# Patient Record
Sex: Female | Born: 1975
Health system: Southern US, Community
[De-identification: ages and names within clinical notes are randomized; demographics above are authoritative.]

## PROBLEM LIST (undated history)

## (undated) DIAGNOSIS — K219 Gastro-esophageal reflux disease without esophagitis: Secondary | ICD-10-CM

## (undated) DIAGNOSIS — T7840XA Allergy, unspecified, initial encounter: Secondary | ICD-10-CM

## (undated) DIAGNOSIS — E119 Type 2 diabetes mellitus without complications: Secondary | ICD-10-CM

## (undated) DIAGNOSIS — Z86010 Personal history of colonic polyps: Secondary | ICD-10-CM

## (undated) DIAGNOSIS — G5600 Carpal tunnel syndrome, unspecified upper limb: Secondary | ICD-10-CM

## (undated) DIAGNOSIS — K589 Irritable bowel syndrome without diarrhea: Secondary | ICD-10-CM

## (undated) DIAGNOSIS — J309 Allergic rhinitis, unspecified: Secondary | ICD-10-CM

## (undated) DIAGNOSIS — D519 Vitamin B12 deficiency anemia, unspecified: Secondary | ICD-10-CM

## (undated) DIAGNOSIS — A0472 Enterocolitis due to Clostridium difficile, not specified as recurrent: Secondary | ICD-10-CM

## (undated) DIAGNOSIS — M797 Fibromyalgia: Secondary | ICD-10-CM

## (undated) DIAGNOSIS — E785 Hyperlipidemia, unspecified: Secondary | ICD-10-CM

## (undated) DIAGNOSIS — N889 Noninflammatory disorder of cervix uteri, unspecified: Secondary | ICD-10-CM

## (undated) DIAGNOSIS — I10 Essential (primary) hypertension: Secondary | ICD-10-CM

## (undated) DIAGNOSIS — G35D Multiple sclerosis, unspecified: Secondary | ICD-10-CM

## (undated) DIAGNOSIS — G4733 Obstructive sleep apnea (adult) (pediatric): Secondary | ICD-10-CM

## (undated) DIAGNOSIS — G35 Multiple sclerosis: Secondary | ICD-10-CM

## (undated) DIAGNOSIS — F419 Anxiety disorder, unspecified: Secondary | ICD-10-CM

## (undated) DIAGNOSIS — E559 Vitamin D deficiency, unspecified: Secondary | ICD-10-CM

## (undated) DIAGNOSIS — G4730 Sleep apnea, unspecified: Secondary | ICD-10-CM

## (undated) DIAGNOSIS — R011 Cardiac murmur, unspecified: Secondary | ICD-10-CM

## (undated) DIAGNOSIS — R5382 Chronic fatigue, unspecified: Secondary | ICD-10-CM

## (undated) HISTORY — DX: Essential (primary) hypertension: I10

## (undated) HISTORY — DX: Fibromyalgia: M79.7

## (undated) HISTORY — DX: Irritable bowel syndrome without diarrhea: K58.9

## (undated) HISTORY — DX: Obstructive sleep apnea (adult) (pediatric): G47.33

## (undated) HISTORY — DX: Vitamin D deficiency, unspecified: E55.9

## (undated) HISTORY — DX: Carpal tunnel syndrome, unspecified upper limb: G56.00

## (undated) HISTORY — DX: Chronic fatigue, unspecified: R53.82

## (undated) HISTORY — DX: Allergic rhinitis, unspecified: J30.9

## (undated) HISTORY — DX: Hyperlipidemia, unspecified: E78.5

## (undated) HISTORY — DX: Type 2 diabetes mellitus without complications: E11.9

## (undated) HISTORY — DX: Multiple sclerosis: G35

## (undated) HISTORY — DX: Multiple sclerosis, unspecified: G35.D

## (undated) HISTORY — DX: Enterocolitis due to Clostridium difficile, not specified as recurrent: A04.72

## (undated) HISTORY — DX: Gastro-esophageal reflux disease without esophagitis: K21.9

## (undated) HISTORY — DX: Cardiac murmur, unspecified: R01.1

## (undated) HISTORY — DX: Sleep apnea, unspecified: G47.30

## (undated) HISTORY — DX: Personal history of colonic polyps: Z86.010

## (undated) HISTORY — DX: Noninflammatory disorder of cervix uteri, unspecified: N88.9

## (undated) HISTORY — DX: Anxiety disorder, unspecified: F41.9

## (undated) HISTORY — DX: Vitamin B12 deficiency anemia, unspecified: D51.9

## (undated) HISTORY — DX: Allergy, unspecified, initial encounter: T78.40XA

---

## 1997-07-29 ENCOUNTER — Other Ambulatory Visit: Admission: RE | Admit: 1997-07-29 | Discharge: 1997-07-29 | Payer: Self-pay | Admitting: Obstetrics and Gynecology

## 1998-02-26 ENCOUNTER — Other Ambulatory Visit: Admission: RE | Admit: 1998-02-26 | Discharge: 1998-02-26 | Payer: Self-pay | Admitting: Obstetrics and Gynecology

## 1998-08-14 ENCOUNTER — Inpatient Hospital Stay (HOSPITAL_COMMUNITY): Admission: AD | Admit: 1998-08-14 | Discharge: 1998-08-17 | Payer: Self-pay | Admitting: Obstetrics and Gynecology

## 2000-09-13 ENCOUNTER — Other Ambulatory Visit: Admission: RE | Admit: 2000-09-13 | Discharge: 2000-09-13 | Payer: Self-pay | Admitting: Obstetrics and Gynecology

## 2000-09-28 ENCOUNTER — Other Ambulatory Visit: Admission: RE | Admit: 2000-09-28 | Discharge: 2000-09-28 | Payer: Self-pay | Admitting: Obstetrics and Gynecology

## 2001-02-21 ENCOUNTER — Inpatient Hospital Stay (HOSPITAL_COMMUNITY): Admission: AD | Admit: 2001-02-21 | Discharge: 2001-02-21 | Payer: Self-pay | Admitting: Obstetrics and Gynecology

## 2001-02-22 ENCOUNTER — Inpatient Hospital Stay (HOSPITAL_COMMUNITY): Admission: AD | Admit: 2001-02-22 | Discharge: 2001-02-22 | Payer: Self-pay | Admitting: *Deleted

## 2001-02-26 ENCOUNTER — Inpatient Hospital Stay (HOSPITAL_COMMUNITY): Admission: AD | Admit: 2001-02-26 | Discharge: 2001-02-26 | Payer: Self-pay | Admitting: Obstetrics and Gynecology

## 2001-03-17 ENCOUNTER — Inpatient Hospital Stay (HOSPITAL_COMMUNITY): Admission: AD | Admit: 2001-03-17 | Discharge: 2001-03-17 | Payer: Self-pay | Admitting: Obstetrics and Gynecology

## 2001-04-12 ENCOUNTER — Inpatient Hospital Stay (HOSPITAL_COMMUNITY): Admission: AD | Admit: 2001-04-12 | Discharge: 2001-04-14 | Payer: Self-pay | Admitting: Obstetrics and Gynecology

## 2002-08-08 ENCOUNTER — Other Ambulatory Visit: Admission: RE | Admit: 2002-08-08 | Discharge: 2002-08-08 | Payer: Self-pay | Admitting: Obstetrics and Gynecology

## 2003-08-13 ENCOUNTER — Other Ambulatory Visit: Admission: RE | Admit: 2003-08-13 | Discharge: 2003-08-13 | Payer: Self-pay | Admitting: Obstetrics and Gynecology

## 2003-09-12 ENCOUNTER — Encounter: Payer: Self-pay | Admitting: Internal Medicine

## 2003-09-26 ENCOUNTER — Encounter: Payer: Self-pay | Admitting: Internal Medicine

## 2003-09-26 ENCOUNTER — Ambulatory Visit (HOSPITAL_COMMUNITY): Admission: RE | Admit: 2003-09-26 | Discharge: 2003-09-26 | Payer: Self-pay | Admitting: Internal Medicine

## 2004-03-03 ENCOUNTER — Ambulatory Visit: Payer: Self-pay | Admitting: Internal Medicine

## 2004-04-22 ENCOUNTER — Ambulatory Visit: Payer: Self-pay | Admitting: Internal Medicine

## 2004-05-27 ENCOUNTER — Ambulatory Visit: Payer: Self-pay | Admitting: Internal Medicine

## 2004-06-21 ENCOUNTER — Ambulatory Visit: Payer: Self-pay | Admitting: Internal Medicine

## 2004-07-26 ENCOUNTER — Ambulatory Visit: Payer: Self-pay | Admitting: Internal Medicine

## 2004-08-26 ENCOUNTER — Ambulatory Visit: Payer: Self-pay | Admitting: Internal Medicine

## 2004-08-29 ENCOUNTER — Ambulatory Visit (HOSPITAL_COMMUNITY): Admission: RE | Admit: 2004-08-29 | Discharge: 2004-08-29 | Payer: Self-pay | Admitting: Internal Medicine

## 2004-09-24 ENCOUNTER — Ambulatory Visit: Payer: Self-pay | Admitting: Internal Medicine

## 2004-10-08 ENCOUNTER — Other Ambulatory Visit: Admission: RE | Admit: 2004-10-08 | Discharge: 2004-10-08 | Payer: Self-pay | Admitting: Obstetrics and Gynecology

## 2004-10-13 ENCOUNTER — Encounter: Admission: RE | Admit: 2004-10-13 | Discharge: 2004-10-13 | Payer: Self-pay | Admitting: Neurology

## 2004-10-22 ENCOUNTER — Ambulatory Visit: Payer: Self-pay | Admitting: Internal Medicine

## 2004-11-22 ENCOUNTER — Ambulatory Visit: Payer: Self-pay | Admitting: Internal Medicine

## 2004-12-27 ENCOUNTER — Ambulatory Visit: Payer: Self-pay | Admitting: Internal Medicine

## 2005-01-13 ENCOUNTER — Encounter: Admission: RE | Admit: 2005-01-13 | Discharge: 2005-01-13 | Payer: Self-pay | Admitting: Neurology

## 2005-01-16 ENCOUNTER — Encounter: Admission: RE | Admit: 2005-01-16 | Discharge: 2005-01-16 | Payer: Self-pay | Admitting: Neurology

## 2005-01-24 ENCOUNTER — Ambulatory Visit: Payer: Self-pay | Admitting: Internal Medicine

## 2005-02-02 ENCOUNTER — Ambulatory Visit (HOSPITAL_COMMUNITY): Admission: RE | Admit: 2005-02-02 | Discharge: 2005-02-02 | Payer: Self-pay | Admitting: Neurology

## 2005-02-24 ENCOUNTER — Ambulatory Visit: Payer: Self-pay | Admitting: Internal Medicine

## 2005-03-25 ENCOUNTER — Ambulatory Visit: Payer: Self-pay | Admitting: Internal Medicine

## 2005-04-26 ENCOUNTER — Ambulatory Visit: Payer: Self-pay | Admitting: Internal Medicine

## 2005-05-26 ENCOUNTER — Ambulatory Visit: Payer: Self-pay | Admitting: Internal Medicine

## 2005-06-28 ENCOUNTER — Ambulatory Visit: Payer: Self-pay | Admitting: Internal Medicine

## 2005-07-12 ENCOUNTER — Ambulatory Visit: Payer: Self-pay | Admitting: Internal Medicine

## 2005-08-25 ENCOUNTER — Ambulatory Visit: Payer: Self-pay | Admitting: Internal Medicine

## 2005-11-25 ENCOUNTER — Ambulatory Visit: Payer: Self-pay | Admitting: Internal Medicine

## 2005-12-07 ENCOUNTER — Ambulatory Visit: Payer: Self-pay | Admitting: Internal Medicine

## 2005-12-27 ENCOUNTER — Ambulatory Visit: Payer: Self-pay | Admitting: Internal Medicine

## 2006-01-21 ENCOUNTER — Encounter: Admission: RE | Admit: 2006-01-21 | Discharge: 2006-01-21 | Payer: Self-pay | Admitting: Neurology

## 2006-05-25 ENCOUNTER — Ambulatory Visit: Payer: Self-pay | Admitting: Internal Medicine

## 2006-06-27 ENCOUNTER — Ambulatory Visit: Payer: Self-pay | Admitting: Internal Medicine

## 2006-07-28 ENCOUNTER — Ambulatory Visit: Payer: Self-pay | Admitting: Internal Medicine

## 2006-09-06 ENCOUNTER — Ambulatory Visit: Payer: Self-pay | Admitting: Internal Medicine

## 2006-09-06 LAB — CONVERTED CEMR LAB
ALT: 26 units/L (ref 0–40)
AST: 20 units/L (ref 0–37)
Albumin: 3.9 g/dL (ref 3.5–5.2)
Alkaline Phosphatase: 62 units/L (ref 39–117)
Amylase: 50 units/L (ref 27–131)
BUN: 10 mg/dL (ref 6–23)
Bacteria, UA: NEGATIVE
Basophils Absolute: 0.1 10*3/uL (ref 0.0–0.1)
Basophils Relative: 0.9 % (ref 0.0–1.0)
Bilirubin Urine: NEGATIVE
Bilirubin, Direct: 0.1 mg/dL (ref 0.0–0.3)
CO2: 27 meq/L (ref 19–32)
Calcium: 9.4 mg/dL (ref 8.4–10.5)
Chloride: 108 meq/L (ref 96–112)
Creatinine, Ser: 0.7 mg/dL (ref 0.4–1.2)
Crystals: NEGATIVE
Eosinophils Absolute: 0.1 10*3/uL (ref 0.0–0.6)
Eosinophils Relative: 1.7 % (ref 0.0–5.0)
GFR calc Af Amer: 126 mL/min
GFR calc non Af Amer: 104 mL/min
Glucose, Bld: 102 mg/dL — ABNORMAL HIGH (ref 70–99)
H Pylori IgG: NEGATIVE
HCT: 39.4 % (ref 36.0–46.0)
Hemoglobin, Urine: NEGATIVE
Hemoglobin: 13.8 g/dL (ref 12.0–15.0)
Ketones, ur: NEGATIVE mg/dL
Leukocytes, UA: NEGATIVE
Lipase: 15 units/L (ref 11.0–59.0)
Lymphocytes Relative: 26.8 % (ref 12.0–46.0)
MCHC: 35 g/dL (ref 30.0–36.0)
MCV: 84.8 fL (ref 78.0–100.0)
Monocytes Absolute: 0.4 10*3/uL (ref 0.2–0.7)
Monocytes Relative: 6.2 % (ref 3.0–11.0)
Mucus, UA: NEGATIVE
Neutro Abs: 3.7 10*3/uL (ref 1.4–7.7)
Neutrophils Relative %: 64.4 % (ref 43.0–77.0)
Nitrite: NEGATIVE
Platelets: 320 10*3/uL (ref 150–400)
Potassium: 4.3 meq/L (ref 3.5–5.1)
RBC / HPF: NONE SEEN
RBC: 4.64 M/uL (ref 3.87–5.11)
RDW: 13.8 % (ref 11.5–14.6)
Sodium: 139 meq/L (ref 135–145)
Specific Gravity, Urine: 1.02 (ref 1.000–1.03)
Total Bilirubin: 0.7 mg/dL (ref 0.3–1.2)
Total Protein, Urine: NEGATIVE mg/dL
Total Protein: 7.3 g/dL (ref 6.0–8.3)
Urine Glucose: NEGATIVE mg/dL
Urobilinogen, UA: 0.2 (ref 0.0–1.0)
WBC: 5.9 10*3/uL (ref 4.5–10.5)
pH: 6 (ref 5.0–8.0)

## 2006-09-08 ENCOUNTER — Ambulatory Visit: Payer: Self-pay | Admitting: Internal Medicine

## 2006-09-18 DIAGNOSIS — K219 Gastro-esophageal reflux disease without esophagitis: Secondary | ICD-10-CM

## 2007-01-05 ENCOUNTER — Ambulatory Visit: Payer: Self-pay | Admitting: Internal Medicine

## 2007-03-12 ENCOUNTER — Telehealth (INDEPENDENT_AMBULATORY_CARE_PROVIDER_SITE_OTHER): Payer: Self-pay | Admitting: *Deleted

## 2007-03-13 ENCOUNTER — Ambulatory Visit: Payer: Self-pay | Admitting: Internal Medicine

## 2007-03-13 DIAGNOSIS — I10 Essential (primary) hypertension: Secondary | ICD-10-CM

## 2007-03-13 DIAGNOSIS — E538 Deficiency of other specified B group vitamins: Secondary | ICD-10-CM | POA: Insufficient documentation

## 2007-03-13 DIAGNOSIS — J309 Allergic rhinitis, unspecified: Secondary | ICD-10-CM | POA: Insufficient documentation

## 2007-03-13 DIAGNOSIS — Z8719 Personal history of other diseases of the digestive system: Secondary | ICD-10-CM

## 2007-03-13 DIAGNOSIS — G35 Multiple sclerosis: Secondary | ICD-10-CM

## 2007-03-13 DIAGNOSIS — E785 Hyperlipidemia, unspecified: Secondary | ICD-10-CM

## 2007-03-13 HISTORY — DX: Hyperlipidemia, unspecified: E78.5

## 2007-03-13 HISTORY — DX: Essential (primary) hypertension: I10

## 2007-03-13 HISTORY — DX: Allergic rhinitis, unspecified: J30.9

## 2007-03-19 ENCOUNTER — Telehealth (INDEPENDENT_AMBULATORY_CARE_PROVIDER_SITE_OTHER): Payer: Self-pay | Admitting: *Deleted

## 2007-04-09 ENCOUNTER — Telehealth (INDEPENDENT_AMBULATORY_CARE_PROVIDER_SITE_OTHER): Payer: Self-pay | Admitting: *Deleted

## 2007-04-16 ENCOUNTER — Ambulatory Visit: Payer: Self-pay | Admitting: Internal Medicine

## 2007-04-30 ENCOUNTER — Encounter: Payer: Self-pay | Admitting: Internal Medicine

## 2007-04-30 ENCOUNTER — Ambulatory Visit: Payer: Self-pay | Admitting: Internal Medicine

## 2007-04-30 DIAGNOSIS — D179 Benign lipomatous neoplasm, unspecified: Secondary | ICD-10-CM | POA: Insufficient documentation

## 2007-04-30 LAB — CONVERTED CEMR LAB: TSH: 1.88 microintl units/mL (ref 0.35–5.50)

## 2007-05-17 ENCOUNTER — Ambulatory Visit: Payer: Self-pay | Admitting: Internal Medicine

## 2007-06-11 ENCOUNTER — Ambulatory Visit: Payer: Self-pay | Admitting: Internal Medicine

## 2007-07-12 ENCOUNTER — Ambulatory Visit: Payer: Self-pay | Admitting: Internal Medicine

## 2007-08-13 ENCOUNTER — Ambulatory Visit: Payer: Self-pay | Admitting: Internal Medicine

## 2007-08-13 LAB — CONVERTED CEMR LAB: Beta hcg, urine, semiquantitative: NEGATIVE

## 2007-08-22 ENCOUNTER — Ambulatory Visit: Payer: Self-pay | Admitting: Internal Medicine

## 2007-08-22 DIAGNOSIS — R229 Localized swelling, mass and lump, unspecified: Secondary | ICD-10-CM

## 2007-08-22 DIAGNOSIS — R21 Rash and other nonspecific skin eruption: Secondary | ICD-10-CM | POA: Insufficient documentation

## 2007-09-20 ENCOUNTER — Ambulatory Visit: Payer: Self-pay | Admitting: Internal Medicine

## 2007-09-24 ENCOUNTER — Telehealth (INDEPENDENT_AMBULATORY_CARE_PROVIDER_SITE_OTHER): Payer: Self-pay | Admitting: *Deleted

## 2007-09-25 ENCOUNTER — Ambulatory Visit: Payer: Self-pay | Admitting: Internal Medicine

## 2007-09-25 DIAGNOSIS — R079 Chest pain, unspecified: Secondary | ICD-10-CM

## 2007-09-25 DIAGNOSIS — F411 Generalized anxiety disorder: Secondary | ICD-10-CM | POA: Insufficient documentation

## 2007-10-11 ENCOUNTER — Ambulatory Visit: Payer: Self-pay | Admitting: Internal Medicine

## 2007-10-11 ENCOUNTER — Encounter (INDEPENDENT_AMBULATORY_CARE_PROVIDER_SITE_OTHER): Payer: Self-pay | Admitting: *Deleted

## 2007-11-09 ENCOUNTER — Ambulatory Visit: Payer: Self-pay | Admitting: Internal Medicine

## 2007-11-14 LAB — CONVERTED CEMR LAB: Pap Smear: NORMAL

## 2008-02-07 ENCOUNTER — Ambulatory Visit: Payer: Self-pay | Admitting: Internal Medicine

## 2008-03-10 ENCOUNTER — Ambulatory Visit: Payer: Self-pay | Admitting: Internal Medicine

## 2008-05-08 ENCOUNTER — Ambulatory Visit: Payer: Self-pay | Admitting: Internal Medicine

## 2008-06-06 ENCOUNTER — Ambulatory Visit: Payer: Self-pay | Admitting: Internal Medicine

## 2008-06-06 DIAGNOSIS — R51 Headache: Secondary | ICD-10-CM

## 2008-06-06 DIAGNOSIS — J209 Acute bronchitis, unspecified: Secondary | ICD-10-CM

## 2008-06-06 DIAGNOSIS — R519 Headache, unspecified: Secondary | ICD-10-CM | POA: Insufficient documentation

## 2008-06-06 DIAGNOSIS — J019 Acute sinusitis, unspecified: Secondary | ICD-10-CM

## 2008-07-31 ENCOUNTER — Ambulatory Visit: Payer: Self-pay | Admitting: Internal Medicine

## 2008-09-02 ENCOUNTER — Ambulatory Visit: Payer: Self-pay | Admitting: Internal Medicine

## 2008-10-03 ENCOUNTER — Telehealth: Payer: Self-pay | Admitting: Internal Medicine

## 2008-10-14 ENCOUNTER — Telehealth: Payer: Self-pay | Admitting: Internal Medicine

## 2008-10-24 ENCOUNTER — Ambulatory Visit: Payer: Self-pay | Admitting: Internal Medicine

## 2008-10-24 LAB — CONVERTED CEMR LAB
ALT: 28 units/L (ref 0–35)
AST: 30 units/L (ref 0–37)
Albumin: 3.8 g/dL (ref 3.5–5.2)
Alkaline Phosphatase: 68 units/L (ref 39–117)
BUN: 9 mg/dL (ref 6–23)
Basophils Absolute: 0.1 10*3/uL (ref 0.0–0.1)
Basophils Relative: 0.8 % (ref 0.0–3.0)
Bilirubin Urine: NEGATIVE
Bilirubin, Direct: 0.1 mg/dL (ref 0.0–0.3)
CO2: 27 meq/L (ref 19–32)
Calcium: 9.3 mg/dL (ref 8.4–10.5)
Chloride: 105 meq/L (ref 96–112)
Cholesterol: 192 mg/dL (ref 0–200)
Creatinine, Ser: 0.9 mg/dL (ref 0.4–1.2)
Direct LDL: 114.3 mg/dL
Eosinophils Absolute: 0.1 10*3/uL (ref 0.0–0.7)
Eosinophils Relative: 2.3 % (ref 0.0–5.0)
GFR calc non Af Amer: 76.9 mL/min (ref >60)
Glucose, Bld: 106 mg/dL — ABNORMAL HIGH (ref 70–99)
HCT: 39.4 % (ref 36.0–46.0)
HDL: 35.2 mg/dL — ABNORMAL LOW (ref >39.00)
Hemoglobin, Urine: NEGATIVE
Hemoglobin: 13.7 g/dL (ref 12.0–15.0)
Ketones, ur: NEGATIVE mg/dL
Lymphocytes Relative: 28.4 % (ref 12.0–46.0)
Lymphs Abs: 1.8 10*3/uL (ref 0.7–4.0)
MCHC: 34.7 g/dL (ref 30.0–36.0)
MCV: 86.9 fL (ref 78.0–100.0)
Monocytes Absolute: 0.3 10*3/uL (ref 0.1–1.0)
Monocytes Relative: 4.9 % (ref 3.0–12.0)
Neutro Abs: 4.2 10*3/uL (ref 1.4–7.7)
Neutrophils Relative %: 63.6 % (ref 43.0–77.0)
Nitrite: NEGATIVE
Platelets: 316 10*3/uL (ref 150.0–400.0)
Potassium: 3.6 meq/L (ref 3.5–5.1)
RBC: 4.53 M/uL (ref 3.87–5.11)
RDW: 14.2 % (ref 11.5–14.6)
Sodium: 142 meq/L (ref 135–145)
Specific Gravity, Urine: 1.015 (ref 1.000–1.030)
TSH: 1.75 microintl units/mL (ref 0.35–5.50)
Total Bilirubin: 0.8 mg/dL (ref 0.3–1.2)
Total CHOL/HDL Ratio: 5
Total Protein, Urine: NEGATIVE mg/dL
Total Protein: 7.1 g/dL (ref 6.0–8.3)
Triglycerides: 327 mg/dL — ABNORMAL HIGH (ref 0.0–149.0)
Urine Glucose: NEGATIVE mg/dL
Urobilinogen, UA: 0.2 (ref 0.0–1.0)
VLDL: 65.4 mg/dL — ABNORMAL HIGH (ref 0.0–40.0)
WBC: 6.5 10*3/uL (ref 4.5–10.5)
pH: 6 (ref 5.0–8.0)

## 2008-10-30 ENCOUNTER — Ambulatory Visit: Payer: Self-pay | Admitting: Internal Medicine

## 2008-10-30 ENCOUNTER — Encounter (INDEPENDENT_AMBULATORY_CARE_PROVIDER_SITE_OTHER): Payer: Self-pay | Admitting: *Deleted

## 2008-10-30 DIAGNOSIS — R197 Diarrhea, unspecified: Secondary | ICD-10-CM

## 2008-10-30 DIAGNOSIS — G4710 Hypersomnia, unspecified: Secondary | ICD-10-CM | POA: Insufficient documentation

## 2008-10-31 ENCOUNTER — Encounter: Payer: Self-pay | Admitting: Internal Medicine

## 2008-11-10 ENCOUNTER — Telehealth (INDEPENDENT_AMBULATORY_CARE_PROVIDER_SITE_OTHER): Payer: Self-pay | Admitting: *Deleted

## 2008-11-10 ENCOUNTER — Encounter (INDEPENDENT_AMBULATORY_CARE_PROVIDER_SITE_OTHER): Payer: Self-pay | Admitting: *Deleted

## 2008-11-18 ENCOUNTER — Ambulatory Visit: Payer: Self-pay | Admitting: Pulmonary Disease

## 2008-11-19 ENCOUNTER — Encounter: Payer: Self-pay | Admitting: Pulmonary Disease

## 2008-11-19 ENCOUNTER — Ambulatory Visit (HOSPITAL_BASED_OUTPATIENT_CLINIC_OR_DEPARTMENT_OTHER): Admission: RE | Admit: 2008-11-19 | Discharge: 2008-11-19 | Payer: Self-pay | Admitting: Pulmonary Disease

## 2008-11-29 ENCOUNTER — Ambulatory Visit: Payer: Self-pay | Admitting: Pulmonary Disease

## 2008-12-01 ENCOUNTER — Encounter: Payer: Self-pay | Admitting: Pulmonary Disease

## 2008-12-01 ENCOUNTER — Telehealth (INDEPENDENT_AMBULATORY_CARE_PROVIDER_SITE_OTHER): Payer: Self-pay | Admitting: *Deleted

## 2008-12-08 ENCOUNTER — Ambulatory Visit: Payer: Self-pay | Admitting: Pulmonary Disease

## 2008-12-08 DIAGNOSIS — G4733 Obstructive sleep apnea (adult) (pediatric): Secondary | ICD-10-CM

## 2008-12-08 HISTORY — DX: Obstructive sleep apnea (adult) (pediatric): G47.33

## 2008-12-24 ENCOUNTER — Ambulatory Visit (HOSPITAL_COMMUNITY): Admission: RE | Admit: 2008-12-24 | Discharge: 2008-12-24 | Payer: Self-pay | Admitting: Neurology

## 2009-01-14 ENCOUNTER — Ambulatory Visit: Payer: Self-pay | Admitting: Internal Medicine

## 2009-01-22 ENCOUNTER — Ambulatory Visit: Payer: Self-pay | Admitting: Internal Medicine

## 2009-02-27 ENCOUNTER — Ambulatory Visit: Payer: Self-pay | Admitting: Internal Medicine

## 2009-03-06 ENCOUNTER — Ambulatory Visit: Payer: Self-pay | Admitting: Internal Medicine

## 2009-03-06 DIAGNOSIS — N390 Urinary tract infection, site not specified: Secondary | ICD-10-CM

## 2009-03-06 LAB — CONVERTED CEMR LAB
Bilirubin Urine: NEGATIVE
Glucose, Urine, Semiquant: NEGATIVE
Ketones, urine, test strip: NEGATIVE
Nitrite: POSITIVE
Protein, U semiquant: >=300
Specific Gravity, Urine: 1.015
Urobilinogen, UA: 0.2
pH: 5

## 2009-03-20 ENCOUNTER — Telehealth: Payer: Self-pay | Admitting: Internal Medicine

## 2009-04-18 LAB — CONVERTED CEMR LAB: Pap Smear: NORMAL

## 2009-04-20 ENCOUNTER — Encounter: Payer: Self-pay | Admitting: Internal Medicine

## 2009-04-22 ENCOUNTER — Encounter: Admission: RE | Admit: 2009-04-22 | Discharge: 2009-04-22 | Payer: Self-pay | Admitting: Family Medicine

## 2009-04-22 ENCOUNTER — Encounter (INDEPENDENT_AMBULATORY_CARE_PROVIDER_SITE_OTHER): Payer: Self-pay | Admitting: *Deleted

## 2009-05-04 ENCOUNTER — Ambulatory Visit (HOSPITAL_COMMUNITY): Admission: RE | Admit: 2009-05-04 | Discharge: 2009-05-04 | Payer: Self-pay | Admitting: Family Medicine

## 2009-05-04 ENCOUNTER — Encounter (INDEPENDENT_AMBULATORY_CARE_PROVIDER_SITE_OTHER): Payer: Self-pay | Admitting: *Deleted

## 2009-05-07 ENCOUNTER — Telehealth (INDEPENDENT_AMBULATORY_CARE_PROVIDER_SITE_OTHER): Payer: Self-pay | Admitting: *Deleted

## 2009-05-18 ENCOUNTER — Ambulatory Visit: Payer: Self-pay | Admitting: Internal Medicine

## 2009-05-18 ENCOUNTER — Encounter (INDEPENDENT_AMBULATORY_CARE_PROVIDER_SITE_OTHER): Payer: Self-pay | Admitting: *Deleted

## 2009-05-19 HISTORY — PX: COLONOSCOPY W/ BIOPSIES AND POLYPECTOMY: SHX1376

## 2009-06-09 ENCOUNTER — Ambulatory Visit: Payer: Self-pay | Admitting: Internal Medicine

## 2009-06-09 DIAGNOSIS — Z860101 Personal history of adenomatous and serrated colon polyps: Secondary | ICD-10-CM

## 2009-06-09 DIAGNOSIS — Z86010 Personal history of colonic polyps: Secondary | ICD-10-CM

## 2009-06-09 HISTORY — DX: Personal history of adenomatous and serrated colon polyps: Z86.0101

## 2009-06-09 HISTORY — DX: Personal history of colonic polyps: Z86.010

## 2009-06-09 LAB — HM COLONOSCOPY

## 2009-06-11 ENCOUNTER — Telehealth (INDEPENDENT_AMBULATORY_CARE_PROVIDER_SITE_OTHER): Payer: Self-pay | Admitting: *Deleted

## 2009-07-09 ENCOUNTER — Ambulatory Visit: Payer: Self-pay | Admitting: Internal Medicine

## 2009-07-09 DIAGNOSIS — M542 Cervicalgia: Secondary | ICD-10-CM | POA: Insufficient documentation

## 2009-07-15 ENCOUNTER — Ambulatory Visit: Payer: Self-pay | Admitting: Internal Medicine

## 2009-10-12 ENCOUNTER — Telehealth: Payer: Self-pay | Admitting: Internal Medicine

## 2009-10-13 ENCOUNTER — Ambulatory Visit: Payer: Self-pay | Admitting: Internal Medicine

## 2009-12-08 ENCOUNTER — Encounter: Payer: Self-pay | Admitting: Internal Medicine

## 2009-12-11 ENCOUNTER — Ambulatory Visit: Payer: Self-pay | Admitting: Internal Medicine

## 2010-01-12 ENCOUNTER — Ambulatory Visit: Payer: Self-pay | Admitting: Internal Medicine

## 2010-01-12 LAB — CONVERTED CEMR LAB
ALT: 23 units/L (ref 0–35)
AST: 25 units/L (ref 0–37)
Albumin: 4.4 g/dL (ref 3.5–5.2)
Alkaline Phosphatase: 68 units/L (ref 39–117)
BUN: 10 mg/dL (ref 6–23)
Basophils Absolute: 0.1 10*3/uL (ref 0.0–0.1)
Basophils Relative: 1.2 % (ref 0.0–3.0)
Bilirubin Urine: NEGATIVE
Bilirubin, Direct: 0.2 mg/dL (ref 0.0–0.3)
CO2: 29 meq/L (ref 19–32)
Calcium: 9.3 mg/dL (ref 8.4–10.5)
Chloride: 103 meq/L (ref 96–112)
Cholesterol: 153 mg/dL (ref 0–200)
Creatinine, Ser: 0.8 mg/dL (ref 0.4–1.2)
Direct LDL: 80.7 mg/dL
Eosinophils Absolute: 0.2 10*3/uL (ref 0.0–0.7)
Eosinophils Relative: 2.4 % (ref 0.0–5.0)
GFR calc non Af Amer: 90.03 mL/min (ref >60)
Glucose, Bld: 94 mg/dL (ref 70–99)
HCT: 38.2 % (ref 36.0–46.0)
HDL: 40.6 mg/dL (ref >39.00)
Hemoglobin, Urine: NEGATIVE
Hemoglobin: 13 g/dL (ref 12.0–15.0)
Ketones, ur: NEGATIVE mg/dL
Lymphocytes Relative: 29.3 % (ref 12.0–46.0)
Lymphs Abs: 2 10*3/uL (ref 0.7–4.0)
MCHC: 33.9 g/dL (ref 30.0–36.0)
MCV: 87.7 fL (ref 78.0–100.0)
Monocytes Absolute: 0.4 10*3/uL (ref 0.1–1.0)
Monocytes Relative: 6 % (ref 3.0–12.0)
Neutro Abs: 4.2 10*3/uL (ref 1.4–7.7)
Neutrophils Relative %: 61.1 % (ref 43.0–77.0)
Nitrite: NEGATIVE
Platelets: 327 10*3/uL (ref 150.0–400.0)
Potassium: 4.1 meq/L (ref 3.5–5.1)
RBC: 4.36 M/uL (ref 3.87–5.11)
RDW: 14.9 % — ABNORMAL HIGH (ref 11.5–14.6)
Sodium: 140 meq/L (ref 135–145)
Specific Gravity, Urine: 1.015 (ref 1.000–1.030)
TSH: 2.32 microintl units/mL (ref 0.35–5.50)
Total Bilirubin: 0.5 mg/dL (ref 0.3–1.2)
Total CHOL/HDL Ratio: 4
Total Protein, Urine: NEGATIVE mg/dL
Total Protein: 8.1 g/dL (ref 6.0–8.3)
Triglycerides: 264 mg/dL — ABNORMAL HIGH (ref 0.0–149.0)
Urine Glucose: NEGATIVE mg/dL
Urobilinogen, UA: 0.2 (ref 0.0–1.0)
VLDL: 52.8 mg/dL — ABNORMAL HIGH (ref 0.0–40.0)
WBC: 6.9 10*3/uL (ref 4.5–10.5)
pH: 6.5 (ref 5.0–8.0)

## 2010-01-13 ENCOUNTER — Encounter: Payer: Self-pay | Admitting: Internal Medicine

## 2010-01-13 ENCOUNTER — Ambulatory Visit: Payer: Self-pay | Admitting: Internal Medicine

## 2010-01-13 DIAGNOSIS — G5600 Carpal tunnel syndrome, unspecified upper limb: Secondary | ICD-10-CM

## 2010-01-13 DIAGNOSIS — E559 Vitamin D deficiency, unspecified: Secondary | ICD-10-CM

## 2010-01-13 HISTORY — DX: Vitamin D deficiency, unspecified: E55.9

## 2010-01-13 HISTORY — DX: Carpal tunnel syndrome, unspecified upper limb: G56.00

## 2010-01-14 ENCOUNTER — Telehealth: Payer: Self-pay | Admitting: Internal Medicine

## 2010-01-14 LAB — CONVERTED CEMR LAB: Vit D, 25-Hydroxy: 40 ng/mL (ref 30–89)

## 2010-01-16 HISTORY — PX: CARPAL TUNNEL RELEASE: SHX101

## 2010-02-02 ENCOUNTER — Encounter: Payer: Self-pay | Admitting: Internal Medicine

## 2010-02-16 ENCOUNTER — Ambulatory Visit: Payer: Self-pay | Admitting: Internal Medicine

## 2010-03-18 ENCOUNTER — Ambulatory Visit: Payer: Self-pay | Admitting: Internal Medicine

## 2010-03-18 ENCOUNTER — Telehealth: Payer: Self-pay | Admitting: Internal Medicine

## 2010-03-29 ENCOUNTER — Ambulatory Visit: Payer: Self-pay | Admitting: Internal Medicine

## 2010-03-29 DIAGNOSIS — M25569 Pain in unspecified knee: Secondary | ICD-10-CM

## 2010-03-31 ENCOUNTER — Ambulatory Visit (HOSPITAL_COMMUNITY)
Admission: RE | Admit: 2010-03-31 | Discharge: 2010-03-31 | Payer: Self-pay | Source: Home / Self Care | Attending: Internal Medicine | Admitting: Internal Medicine

## 2010-04-14 ENCOUNTER — Ambulatory Visit: Payer: Self-pay | Admitting: Internal Medicine

## 2010-04-14 LAB — CONVERTED CEMR LAB: Beta hcg, urine, semiquantitative: NEGATIVE

## 2010-04-20 ENCOUNTER — Ambulatory Visit
Admission: RE | Admit: 2010-04-20 | Discharge: 2010-04-20 | Payer: Self-pay | Source: Home / Self Care | Attending: Internal Medicine | Admitting: Internal Medicine

## 2010-05-09 ENCOUNTER — Encounter: Payer: Self-pay | Admitting: Neurology

## 2010-05-20 NOTE — Assessment & Plan Note (Signed)
Summary: PER PT 1 MTH B12  JWJ   STC   Nurse Visit   Allergies: 1)  ! Penicillin  Medication Administration  Injection # 1:    Medication: Vit B12 1000 mcg    Diagnosis: VITAMIN B12 DEFICIENCY (ICD-266.2)    Route: IM    Site: R deltoid    Exp Date: 10/2011    Lot #: [id number]    Mfr: [employer] Regent    Patient tolerated injection without complications    Given by: Brenton Grills CMA (AAMA) (April 20, 2010 8:26 AM)  Orders Added: 1)  Admin of Therapeutic Inj  intramuscular or subcutaneous [96372] 2)  Vit B12 1000 mcg [J3420]

## 2010-05-20 NOTE — Miscellaneous (Signed)
Summary: Orders Update  Clinical Lists Changes  Orders: Added new Referral order of Neurosurgeon Referral (Neurosurgeon) - Signed 

## 2010-05-20 NOTE — Assessment & Plan Note (Signed)
Summary: something popped in left knee-lb   Vital Signs:  Patient profile:   35 year old female Height:      61 inches Weight:      211.25 pounds BMI:     40.06 O2 Sat:      98 % on Room air Temp:     97.8 degrees F oral Pulse rate:   93 / minute BP sitting:   102 / 72  (left arm) Cuff size:   large  Vitals Entered By: Zella Ball Ewing CMA Duncan Dull) (March 29, 2010 2:38 PM)  O2 Flow:  Room air CC: left knee pain/RE   Primary Care Jo-Ann Johanning:  Antony Haste, MD  CC:  left knee pain/RE.  History of Present Illness: here with c/o acute onset 1-2 days severe pain to left knee, lateral aspect with mild swelilng but marked decreased ROM, walking with cane to get here, started after a miststep and twist last night wtih a loud pop and knee giveaway with fall in to a chair without other injury;  no prior hx of knee problems; Has been using ICe but not helping it seems; no fever, other trauma or hx of gout.  Pt denies CP, worsening sob, doe, wheezing, orthopnea, pnd, worsening LE edema, palps, dizziness or syncope  Pt denies new neuro symptoms such as headache, facial or extremity weakness  Pt denies polydipsia, polyuria, or low sugar symptoms such as shakiness improved with eating.  Overall good compliance with meds, trying to follow low chol, DM diet, wt stable, little excercise however   Problems Prior to Update: 1)  Knee Pain, Left, Acute  (ICD-719.46) 2)  Preventive Health Care  (ICD-V70.0) 3)  Vitamin D Deficiency  (ICD-268.9) 4)  Carpal Tunnel Syndrome, Bilateral  (ICD-354.0) 5)  Contraceptive Management  (ICD-V25.09) 6)  Neck Pain  (ICD-723.1) 7)  Uti  (ICD-599.0) 8)  Obstructive Sleep Apnea  (ICD-327.23) 9)  Diarrhea  (ICD-787.91) 10)  Hypersomnia Unspecified  (ICD-780.54) 11)  Bronchitis, Acute  (ICD-466.0) 12)  Headache  (ICD-784.0) 13)  Sinusitis, Acute  (ICD-461.9) 14)  Anxiety  (ICD-300.00) 15)  Chest Pain  (ICD-786.50) 16)  Localized Superficial Swelling Mass or Lump   (ICD-782.2) 17)  Rash-nonvesicular  (ICD-782.1) 18)  Lipoma  (ICD-214.9) 19)  Essential Hypertension  (ICD-401.9) 20)  Clostridium Difficile Colitis, Hx of  (ICD-V12.79) 21)  Hyperlipidemia  (ICD-272.4) 22)  Multiple Sclerosis  (ICD-340) 23)  Allergic Rhinitis  (ICD-477.9) 24)  Vitamin B12 Deficiency  (ICD-266.2) 25)  Gerd  (ICD-530.81)  Medications Prior to Update: 1)  Lyrica 75 Mg Caps (Pregabalin) .... Two Times A Day 2)  Nortriptyline Hcl 50 Mg Caps (Nortriptyline Hcl) .... Take 2 Capsule By Mouth At Bedtime 3)  Zegerid 40-1100 Mg Caps (Omeprazole-Sodium Bicarbonate) .Marland Kitchen.. 1 By Mouth Once Daily 4)  Cetirizine Hcl 10 Mg  Tabs (Cetirizine Hcl) .Marland Kitchen.. 1 By Mouth Once Daily Prn 5)  Depo-Provera 150 Mg/ml Im Susp (Medroxyprogesterone Acetate) .Marland Kitchen.. 1 Q 3 Months 6)  Furosemide 40 Mg  Tabs (Furosemide) .Marland Kitchen.. 1 By Mouth Once Daily 7)  Metoprolol Tartrate 100 Mg  Tabs (Metoprolol Tartrate) .Marland Kitchen.. 1 By Mouth Two Times A Day 8)  Yl Vitamin B-12 Cr 1000 Mcg  Tbcr (Cyanocobalamin) .Marland Kitchen.. 1 Inj Q Mon. 9)  Klor-Con M10 10 Meq  Tbcr (Potassium Chloride Crys Cr) .Marland Kitchen.. 1 By Mouth Once Daily 10)  Imodium A-D 2 Mg Tabs (Loperamide Hcl) .... As Needed For Diarhea 11)  Simvastatin 40 Mg Tabs (Simvastatin) .Marland Kitchen.. 1po Once Daily 12)  Tramadol Hcl 50 Mg Tabs (Tramadol Hcl) .Marland Kitchen.. 1 - 2 By Mouth Q 6 Hrs As Needed Pain 13)  Diazepam 5 Mg Tabs (Diazepam) .Marland Kitchen.. 1po  Two Times A Day As Needed Spasm 14)  Dicyclomine Hcl 20 Mg  Tabs (Dicyclomine Hcl) .Marland Kitchen.. 1 By Mouth Every 6 Hours As Needed For Abdominal Pain  Current Medications (verified): 1)  Lyrica 75 Mg Caps (Pregabalin) .... Two Times A Day 2)  Nortriptyline Hcl 50 Mg Caps (Nortriptyline Hcl) .... Take 2 Capsule By Mouth At Bedtime 3)  Zegerid 40-1100 Mg Caps (Omeprazole-Sodium Bicarbonate) .Marland Kitchen.. 1 By Mouth Once Daily 4)  Cetirizine Hcl 10 Mg  Tabs (Cetirizine Hcl) .Marland Kitchen.. 1 By Mouth Once Daily Prn 5)  Depo-Provera 150 Mg/ml Im Susp (Medroxyprogesterone Acetate) .Marland Kitchen.. 1 Q 3  Months 6)  Furosemide 40 Mg  Tabs (Furosemide) .Marland Kitchen.. 1 By Mouth Once Daily 7)  Metoprolol Tartrate 100 Mg  Tabs (Metoprolol Tartrate) .Marland Kitchen.. 1 By Mouth Two Times A Day 8)  Yl Vitamin B-12 Cr 1000 Mcg  Tbcr (Cyanocobalamin) .Marland Kitchen.. 1 Inj Q Mon. 9)  Klor-Con M10 10 Meq  Tbcr (Potassium Chloride Crys Cr) .Marland Kitchen.. 1 By Mouth Once Daily 10)  Imodium A-D 2 Mg Tabs (Loperamide Hcl) .... As Needed For Diarhea 11)  Simvastatin 40 Mg Tabs (Simvastatin) .Marland Kitchen.. 1po Once Daily 12)  Tramadol Hcl 50 Mg Tabs (Tramadol Hcl) .Marland Kitchen.. 1 - 2 By Mouth Q 6 Hrs As Needed Pain 13)  Diazepam 5 Mg Tabs (Diazepam) .Marland Kitchen.. 1po  Two Times A Day As Needed Spasm 14)  Dicyclomine Hcl 20 Mg  Tabs (Dicyclomine Hcl) .Marland Kitchen.. 1 By Mouth Every 6 Hours As Needed For Abdominal Pain 15)  Oxycodone Hcl 5 Mg Tabs (Oxycodone Hcl) .Marland Kitchen.. 1 - 2 By Mouth Q 6 Hrs As Needed Pain  Allergies (verified): 1)  ! Penicillin  Past History:  Past Medical History: Last updated: 01/13/2010 GERD fibromylgia heart murmmer chronic fatigue cts B12 deficiency Allergic rhinitis multiple sclerosis Hyperlipidemia hx of c diff colitis Anxiety  MD roster:  neuro; in transition                   GI: Dr Leone Payor                   Pulm:   Dr Shelle Iron  Past Surgical History: Last updated: 04/30/2007 Denies surgical history  Social History: Last updated: 01/13/2010 Never Smoked Alcohol use-no Occupation: Housewife Daily Caffeine Use -3 Illicit Drug Use - no Married, 1 son 1 daughter disabled since 2009 - [place]  Risk Factors: Alcohol Use: 0 (11/18/2008)  Risk Factors: Smoking Status: never (11/18/2008)  Review of Systems       all otherwise negative per pt -    Physical Exam  General:  alert and overweight-appearing.   Head:  normocephalic and atraumatic.   Eyes:  vision grossly intact and pupils equal.   Ears:  R ear normal and L ear normal.   Nose:  no external deformity and no nasal discharge.   Mouth:  no gingival abnormalities and pharynx pink and  moist.   Neck:  supple and no masses.   Lungs:  normal respiratory effort and normal breath sounds.   Heart:  normal rate and regular rhythm.   Msk:  left knee with marked tender and swelling, mostly to left lateral aspect with most tender over the lateral joint line, also marked decreased ROM Extremities:  no edema, no erythema    Impression & Recommendations:  Problem # 1:  KNEE PAIN, LEFT, ACUTE (ICD-719.46) Assessment Deteriorated  Her updated medication list for this problem includes:    Tramadol Hcl 50 Mg Tabs (Tramadol hcl) .Marland Kitchen... 1 - 2 by mouth q 6 hrs as needed pain    Oxycodone Hcl 5 Mg Tabs (Oxycodone hcl) .Marland Kitchen... 1 - 2 by mouth q 6 hrs as needed pain high suspect for left lateral cartilage tear vs severe strain - for MRI left knee, treat as above, f/u any worsening signs or symptoms , consider ortho if MRI abnormal and pain persists  Orders: Radiology Referral (Radiology)  Problem # 2:  ESSENTIAL HYPERTENSION (ICD-401.9) Assessment: Unchanged  Her updated medication list for this problem includes:    Furosemide 40 Mg Tabs (Furosemide) .Marland Kitchen... 1 by mouth once daily    Metoprolol Tartrate 100 Mg Tabs (Metoprolol tartrate) .Marland Kitchen... 1 by mouth two times a day  BP today: 102/72 Prior BP: 100/72 (01/13/2010)  Labs Reviewed: K+: 4.1 (01/12/2010) Creat: : 0.8 (01/12/2010)   Chol: 153 (01/12/2010)   HDL: 40.60 (01/12/2010)   TG: 264.0 (01/12/2010) stable overall by hx and exam, ok to continue meds/tx as is   Complete Medication List: 1)  Lyrica 75 Mg Caps (Pregabalin) .... Two times a day 2)  Nortriptyline Hcl 50 Mg Caps (Nortriptyline hcl) .... Take 2 capsule by mouth at bedtime 3)  Zegerid 40-1100 Mg Caps (Omeprazole-sodium bicarbonate) .Marland Kitchen.. 1 by mouth once daily 4)  Cetirizine Hcl 10 Mg Tabs (Cetirizine hcl) .Marland Kitchen.. 1 by mouth once daily prn 5)  Depo-provera 150 Mg/ml Im Susp (Medroxyprogesterone acetate) .Marland Kitchen.. 1 q 3 months 6)  Furosemide 40 Mg Tabs (Furosemide) .Marland Kitchen.. 1 by mouth  once daily 7)  Metoprolol Tartrate 100 Mg Tabs (Metoprolol tartrate) .Marland Kitchen.. 1 by mouth two times a day 8)  Yl Vitamin B-12 Cr 1000 Mcg Tbcr (Cyanocobalamin) .Marland Kitchen.. 1 inj q mon. 9)  Klor-con M10 10 Meq Tbcr (Potassium chloride crys cr) .Marland Kitchen.. 1 by mouth once daily 10)  Imodium A-d 2 Mg Tabs (Loperamide hcl) .... As needed for diarhea 11)  Simvastatin 40 Mg Tabs (Simvastatin) .Marland Kitchen.. 1po once daily 12)  Tramadol Hcl 50 Mg Tabs (Tramadol hcl) .Marland Kitchen.. 1 - 2 by mouth q 6 hrs as needed pain 13)  Diazepam 5 Mg Tabs (Diazepam) .Marland Kitchen.. 1po  two times a day as needed spasm 14)  Dicyclomine Hcl 20 Mg Tabs (Dicyclomine hcl) .Marland Kitchen.. 1 by mouth every 6 hours as needed for abdominal pain 15)  Oxycodone Hcl 5 Mg Tabs (Oxycodone hcl) .Marland Kitchen.. 1 - 2 by mouth q 6 hrs as needed pain  Patient Instructions: 1)  Please take all new medications as prescribed 2)  Continue all previous medications as before this visit  3)  If the PCC's are not too busy, please see if they are able to get the MRI scheduled while pt is here 4)  Please schedule a follow-up appointment as needed.  Prescriptions: OXYCODONE HCL 5 MG TABS (OXYCODONE HCL) 1 - 2 by mouth q 6 hrs as needed pain  #60 x 0   Entered and Authorized by:   Corwin Levins MD   Signed by:   Corwin Levins MD on 03/29/2010   Method used:   Print then Give to Patient   RxID:   [id number]    Orders Added: 1)  Radiology Referral [Radiology] 2)  Est. Patient Level IV [10272]

## 2010-05-20 NOTE — Assessment & Plan Note (Signed)
Summary: b12/flu shot/[facility]/cd   Nurse Visit   Vital Signs:  Patient profile:   35 year old female Temp:     97.8 degrees F oral  Vitals Entered By: Lamar Sprinkles, CMA (February 27, 2009 8:16 AM)  Allergies: 1)  ! Penicillin  Medication Administration  Injection # 1:    Medication: Vit B12 1000 mcg    Diagnosis: VITAMIN B12 DEFICIENCY (ICD-266.2)    Route: IM    Site: L deltoid    Exp Date: 12/18/2010    Lot #: [id number]    Mfr: American Regent    Patient tolerated injection without complications    Given by: Lamar Sprinkles, CMA (February 27, 2009 8:19 AM)  Orders Added: 1)  Admin 1st Vaccine [90471] 2)  Flu Vaccine 16yrs + [09811] 3)  Vit B12 1000 mcg [J3420] 4)  Admin of Therapeutic Inj  intramuscular or subcutaneous [96372]  Flu Vaccine Consent Questions     Do you have a history of severe allergic reactions to this vaccine? no    Any prior history of allergic reactions to egg and/or gelatin? no    Do you have a sensitivity to the preservative Thimersol? no    Do you have a past history of Guillan-Barre Syndrome? no    Do you currently have an acute febrile illness? no    Have you ever had a severe reaction to latex? no    Vaccine information given and explained to patient? yes    Are you currently pregnant? no    Lot Number:[id number]   Exp Date:10/15/2009   Site Given  Right Deltoid IMu    Medication Administration  Injection # 1:    Medication: Vit B12 1000 mcg    Diagnosis: VITAMIN B12 DEFICIENCY (ICD-266.2)    Route: IM    Site: L deltoid    Exp Date: 12/18/2010    Lot #: [id number]    Mfr: American Regent    Patient tolerated injection without complications    Given by: Lamar Sprinkles, CMA (February 27, 2009 8:19 AM)  Orders Added: 1)  Admin 1st Vaccine [90471] 2)  Flu Vaccine 53yrs + [82956] 3)  Vit B12 1000 mcg [J3420] 4)  Admin of Therapeutic Inj  intramuscular or subcutaneous [21308]

## 2010-05-20 NOTE — Progress Notes (Signed)
  Phone Note Refill Request Message from:  Fax from Pharmacy on October 12, 2009 9:39 AM  Refills Requested: Medication #1:  METOPROLOL TARTRATE 100 MG  TABS 1 by mouth two times a day   Dosage confirmed as above?Dosage Confirmed   Notes: [facility]  Method Requested: Fax to Local Pharmacy Initial call taken by: Robin Ewing CMA Duncan Dull),  October 12, 2009 9:40 AM    Prescriptions: METOPROLOL TARTRATE 100 MG  TABS (METOPROLOL TARTRATE) 1 by mouth two times a day  #60 x 9   Entered by:   Zella Ball Ewing CMA (AAMA)   Authorized by:   Corwin Levins MD   Signed by:   Scharlene Gloss CMA (AAMA) on 10/12/2009   Method used:   Faxed to ...       [facility]       [address] [place], [place]  [address]       Ph: [phone] or [phone]       Fax: [phone]   RxID:   [id number]

## 2010-05-20 NOTE — Assessment & Plan Note (Signed)
Summary: Pain in head and neck since monday-lb   Vital Signs:  Patient profile:   35 year old female Height:      61 inches Weight:      205.75 pounds BMI:     39.02 O2 Sat:      97 % on Room air Temp:     99 degrees F oral Pulse rate:   103 / minute BP sitting:   110 / 80  (left arm) Cuff size:   large  Vitals Entered ByMarland Kitchen Zella Ball Ewing (July 09, 2009 3:50 PM)  O2 Flow:  Room air CC: Head and neck pain/RE   Primary Care Provider:  Antony Haste, MD  CC:  Head and neck pain/RE.  History of Present Illness: here with severe pain, stiffness and tenderness to the left post lat neck area, worse to turn head to the left, started Mon this wk (3 days) just the day after spending the afternoon watching a kite competition outdoors.  No headache, blurred vision, and Pt denies new neuro symptoms such as headache, facial or extremity weakness .  No shoulder or arm syjmtpoms such as pain, weakness or numbness.  No fever, recent trauma, and Pt denies CP, sob, doe, wheezing, orthopnea, pnd, worsening LE edema, palps, dizziness or syncope  Has chronic RUE mild diffuse weakness from MS no change.  No LE symtpoms and no bowel or bladder changes.  No falls.    Problems Prior to Update: 1)  Neck Pain  (ICD-723.1) 2)  Uti  (ICD-599.0) 3)  Obstructive Sleep Apnea  (ICD-327.23) 4)  Diarrhea  (ICD-787.91) 5)  Hypersomnia Unspecified  (ICD-780.54) 6)  Bronchitis, Acute  (ICD-466.0) 7)  Headache  (ICD-784.0) 8)  Sinusitis, Acute  (ICD-461.9) 9)  Anxiety  (ICD-300.00) 10)  Chest Pain  (ICD-786.50) 11)  Localized Superficial Swelling Mass or Lump  (ICD-782.2) 12)  Rash-nonvesicular  (ICD-782.1) 13)  Lipoma  (ICD-214.9) 14)  Essential Hypertension  (ICD-401.9) 15)  Clostridium Difficile Colitis, Hx of  (ICD-V12.79) 16)  Hyperlipidemia  (ICD-272.4) 17)  Multiple Sclerosis  (ICD-340) 18)  Allergic Rhinitis  (ICD-477.9) 19)  Vitamin B12 Deficiency  (ICD-266.2) 20)  Gerd  (ICD-530.81)  Medications  Prior to Update: 1)  Lyrica 75 Mg Caps (Pregabalin) .... Two Times A Day 2)  Nortriptyline Hcl 50 Mg Caps (Nortriptyline Hcl) .... Take 2 Capsule By Mouth At Bedtime 3)  Zegerid 40-1100 Mg Caps (Omeprazole-Sodium Bicarbonate) .Marland Kitchen.. 1 By Mouth Once Daily 4)  Cetirizine Hcl 10 Mg  Tabs (Cetirizine Hcl) .Marland Kitchen.. 1 By Mouth Once Daily Prn 5)  Depo-Provera 150 Mg/ml Im Susp (Medroxyprogesterone Acetate) .Marland Kitchen.. 1 Q 3 Months 6)  Furosemide 40 Mg  Tabs (Furosemide) .Marland Kitchen.. 1 By Mouth Once Daily 7)  Metoprolol Tartrate 100 Mg  Tabs (Metoprolol Tartrate) .Marland Kitchen.. 1 By Mouth Two Times A Day 8)  Yl Vitamin B-12 Cr 1000 Mcg  Tbcr (Cyanocobalamin) .Marland Kitchen.. 1 Inj Q Mon. 9)  Klor-Con M10 10 Meq  Tbcr (Potassium Chloride Crys Cr) .Marland Kitchen.. 1 By Mouth Once Daily 10)  Rebif 22 Mcg/0.85ml Soln (Interferon Beta-1a) .... Three Times Weekly 11)  Imodium A-D 2 Mg Tabs (Loperamide Hcl) .... As Needed For Diarhea 12)  Simcor 1000-40 Mg Xr24h-Tab (Niacin-Simvastatin) .... At Bedtime 13)  Hyoscyamine Sulfate Cr 0.375 Mg Xr12h-Tab (Hyoscyamine Sulfate) .Marland Kitchen.. 1 By Mouth Two Times A Day  Current Medications (verified): 1)  Lyrica 75 Mg Caps (Pregabalin) .... Two Times A Day 2)  Nortriptyline Hcl 50 Mg Caps (Nortriptyline Hcl) .... Take 2  Capsule By Mouth At Bedtime 3)  Zegerid 40-1100 Mg Caps (Omeprazole-Sodium Bicarbonate) .Marland Kitchen.. 1 By Mouth Once Daily 4)  Cetirizine Hcl 10 Mg  Tabs (Cetirizine Hcl) .Marland Kitchen.. 1 By Mouth Once Daily Prn 5)  Depo-Provera 150 Mg/ml Im Susp (Medroxyprogesterone Acetate) .Marland Kitchen.. 1 Q 3 Months 6)  Furosemide 40 Mg  Tabs (Furosemide) .Marland Kitchen.. 1 By Mouth Once Daily 7)  Metoprolol Tartrate 100 Mg  Tabs (Metoprolol Tartrate) .Marland Kitchen.. 1 By Mouth Two Times A Day 8)  Yl Vitamin B-12 Cr 1000 Mcg  Tbcr (Cyanocobalamin) .Marland Kitchen.. 1 Inj Q Mon. 9)  Klor-Con M10 10 Meq  Tbcr (Potassium Chloride Crys Cr) .Marland Kitchen.. 1 By Mouth Once Daily 10)  Rebif 22 Mcg/0.67ml Soln (Interferon Beta-1a) .... Three Times Weekly 11)  Imodium A-D 2 Mg Tabs (Loperamide Hcl) .... As  Needed For Diarhea 12)  Simcor 1000-40 Mg Xr24h-Tab (Niacin-Simvastatin) .... At Bedtime 13)  Hyoscyamine Sulfate Cr 0.375 Mg Xr12h-Tab (Hyoscyamine Sulfate) .Marland Kitchen.. 1 By Mouth Two Times A Day 14)  Tramadol Hcl 50 Mg Tabs (Tramadol Hcl) .Marland Kitchen.. 1 - 2 By Mouth Q 6 Hrs As Needed Pain 15)  Diazepam 5 Mg Tabs (Diazepam) .Marland Kitchen.. 1po  Two Times A Day As Needed Spasm 16)  Flexeril 5 Mg Tabs (Cyclobenzaprine Hcl) .Marland Kitchen.. 1 By Mouth Three Times A Day As Needed  Allergies (verified): 1)  ! Penicillin  Past History:  Past Medical History: Last updated: 09/25/2007 GERD fibromylgia heart murmmer chronic fatigue cts B12 deficiency Allergic rhinitis multiple sclerosis Hyperlipidemia hx of c diff colitis Anxiety  Past Surgical History: Last updated: 04/30/2007 Denies surgical history  Social History: Last updated: 01/14/2009 Never Smoked Alcohol use-no Occupation: Housewife Daily Caffeine Use -3 Illicit Drug Use - no Married, 1 son 1 daughter  Risk Factors: Alcohol Use: 0 (11/18/2008)  Risk Factors: Smoking Status: never (11/18/2008)  Review of Systems       all otherwise negative per pt -    Physical Exam  General:  alert and overweight-appearing.   Head:  normocephalic and atraumatic.   Eyes:  vision grossly intact, pupils equal, and pupils round.   Ears:  R ear normal and L ear normal.   Nose:  no external deformity and no nasal discharge.   Mouth:  no gingival abnormalities and pharynx pink and moist.   Neck:  supple and no masses.   Lungs:  normal respiratory effort and normal breath sounds.   Heart:  normal rate and regular rhythm.   Msk:  mod to severe tender/spasm/swelling to right occiupt, paracervical and trapezoid;  right shoudler with FROM,  Extremities:  no edema, no erythema  Neurologic:  alert & oriented X3, cranial nerves II-XII intact, and strength normal in all extremities except mild chronic RUE weakness, DTR's intact, gait ok   Impression &  Recommendations:  Problem # 1:  NECK PAIN (ICD-723.1)  severe left strain liekly related to recent kite watching show last sun - treat as above, f/u any worsening signs or symptoms   Her updated medication list for this problem includes:    Tramadol Hcl 50 Mg Tabs (Tramadol hcl) .Marland Kitchen... 1 - 2 by mouth q 6 hrs as needed pain    Flexeril 5 Mg Tabs (Cyclobenzaprine hcl) .Marland Kitchen... 1 by mouth three times a day as needed  Problem # 2:  ESSENTIAL HYPERTENSION (ICD-401.9)  Her updated medication list for this problem includes:    Furosemide 40 Mg Tabs (Furosemide) .Marland Kitchen... 1 by mouth once daily    Metoprolol Tartrate 100 Mg  Tabs (Metoprolol tartrate) .Marland Kitchen... 1 by mouth two times a day  BP today: 110/80 Prior BP: 112/78 (05/18/2009)  Labs Reviewed: K+: 3.6 (10/24/2008) Creat: : 0.9 (10/24/2008)   Chol: 192 (10/24/2008)   HDL: 35.20 (10/24/2008)   TG: 327.0 (10/24/2008) stable overall by hx and exam, ok to continue meds/tx as is   Complete Medication List: 1)  Lyrica 75 Mg Caps (Pregabalin) .... Two times a day 2)  Nortriptyline Hcl 50 Mg Caps (Nortriptyline hcl) .... Take 2 capsule by mouth at bedtime 3)  Zegerid 40-1100 Mg Caps (Omeprazole-sodium bicarbonate) .Marland Kitchen.. 1 by mouth once daily 4)  Cetirizine Hcl 10 Mg Tabs (Cetirizine hcl) .Marland Kitchen.. 1 by mouth once daily prn 5)  Depo-provera 150 Mg/ml Im Susp (Medroxyprogesterone acetate) .Marland Kitchen.. 1 q 3 months 6)  Furosemide 40 Mg Tabs (Furosemide) .Marland Kitchen.. 1 by mouth once daily 7)  Metoprolol Tartrate 100 Mg Tabs (Metoprolol tartrate) .Marland Kitchen.. 1 by mouth two times a day 8)  Yl Vitamin B-12 Cr 1000 Mcg Tbcr (Cyanocobalamin) .Marland Kitchen.. 1 inj q mon. 9)  Klor-con M10 10 Meq Tbcr (Potassium chloride crys cr) .Marland Kitchen.. 1 by mouth once daily 10)  Rebif 22 Mcg/0.71ml Soln (Interferon beta-1a) .... Three times weekly 11)  Imodium A-d 2 Mg Tabs (Loperamide hcl) .... As needed for diarhea 12)  Simcor 1000-40 Mg Xr24h-tab (Niacin-simvastatin) .... At bedtime 13)  Hyoscyamine Sulfate Cr 0.375 Mg  Xr12h-tab (Hyoscyamine sulfate) .Marland Kitchen.. 1 by mouth two times a day 14)  Tramadol Hcl 50 Mg Tabs (Tramadol hcl) .Marland Kitchen.. 1 - 2 by mouth q 6 hrs as needed pain 15)  Diazepam 5 Mg Tabs (Diazepam) .Marland Kitchen.. 1po  two times a day as needed spasm 16)  Flexeril 5 Mg Tabs (Cyclobenzaprine hcl) .Marland Kitchen.. 1 by mouth three times a day as needed  Patient Instructions: 1)  Please take all new medications as prescribed  - the pain med and the generic diazepam (valium) 2)  you are also given the flexeril to use after the valium if needed after that 3)  Continue all previous medications as before this visit  4)  Please schedule a follow-up appointment in 4 months with CPX labs Prescriptions: FLEXERIL 5 MG TABS (CYCLOBENZAPRINE HCL) 1 by mouth three times a day as needed  #40 x 0   Entered and Authorized by:   Corwin Levins MD   Signed by:   Corwin Levins MD on 07/09/2009   Method used:   Print then Give to Patient   RxID:   [id number] DIAZEPAM 5 MG TABS (DIAZEPAM) 1po  two times a day as needed spasm  #10 x 0   Entered and Authorized by:   Corwin Levins MD   Signed by:   Corwin Levins MD on 07/09/2009   Method used:   Print then Give to Patient   RxID:   [id number] TRAMADOL HCL 50 MG TABS (TRAMADOL HCL) 1 - 2 by mouth q 6 hrs as needed pain  #60 x 1   Entered and Authorized by:   Corwin Levins MD   Signed by:   Corwin Levins MD on 07/09/2009   Method used:   Print then Give to Patient   RxID:   [id number]

## 2010-05-20 NOTE — Assessment & Plan Note (Signed)
Summary: Diarrhea, abd pain    History of Present Illness Visit Type: Follow-up Consult Primary GI MD: Stan Head MD [facility] Primary Provider: Antony Haste, MD Requesting Provider: Antony Haste, MD Chief Complaint: Abdominal pain & diarrhea History of Present Illness:   35 yo married white woman is being seen to evaluate diarrhea. She has been seen for this in 2003 (C. diff) and 2005), previously. Post-prandial cramps, bloating and gas with diarrhea. Has been intermittent at least for 1-2 years and now daily problem since March 2010. No nocturnal stools. She was seen in October 2010 and metronidazole prescribed. She is not any better. She noticed facial flushing and upper body rash very recently. She just started [employer]. Her LFT's are abnormal on IFN, tracked by Dr. Thad Ranger transaminases 65 before Simcor, she says    GI Review of Systems    Reports abdominal pain, acid reflux, bloating, and  nausea.     Location of  Abdominal pain: RUQ.    Denies belching, chest pain, dysphagia with liquids, dysphagia with solids, heartburn, loss of appetite, vomiting, vomiting blood, weight loss, and  weight gain.      Reports diarrhea.     Denies anal fissure, black tarry stools, change in bowel habit, constipation, diverticulosis, fecal incontinence, heme positive stool, hemorrhoids, irritable bowel syndrome, jaundice, light color stool, liver problems, rectal bleeding, and  rectal pain.    Current Medications (verified): 1)  Lyrica 75 Mg Caps (Pregabalin) .... Two Times A Day 2)  Nortriptyline Hcl 50 Mg Caps (Nortriptyline Hcl) .... Take 2 Capsule By Mouth At Bedtime 3)  Zegerid 40-1100 Mg Caps (Omeprazole-Sodium Bicarbonate) .Marland Kitchen.. 1 By Mouth Once Daily 4)  Cetirizine Hcl 10 Mg  Tabs (Cetirizine Hcl) .Marland Kitchen.. 1 By Mouth Once Daily Prn 5)  Depo-Provera 150 Mg/ml Im Susp (Medroxyprogesterone Acetate) .Marland Kitchen.. 1 Q 3 Months 6)  Furosemide 40 Mg  Tabs (Furosemide) .Marland Kitchen.. 1 By Mouth Once Daily 7)  Metoprolol  Tartrate 100 Mg  Tabs (Metoprolol Tartrate) .Marland Kitchen.. 1 By Mouth Two Times A Day 8)  Yl Vitamin B-12 Cr 1000 Mcg  Tbcr (Cyanocobalamin) .Marland Kitchen.. 1 Inj Q Mon. 9)  Klor-Con M10 10 Meq  Tbcr (Potassium Chloride Crys Cr) .Marland Kitchen.. 1 By Mouth Once Daily 10)  Rebif 22 Mcg/0.46ml Soln (Interferon Beta-1a) .... Three Times Weekly 11)  Imodium A-D 2 Mg Tabs (Loperamide Hcl) .... As Needed For Diarhea 12)  Simcor 1000-40 Mg Xr24h-Tab (Niacin-Simvastatin) .... At Bedtime  Allergies (verified): 1)  ! Penicillin  Past History:  Past Medical History: Reviewed history from 09/25/2007 and no changes required. GERD fibromylgia heart murmmer chronic fatigue cts B12 deficiency Allergic rhinitis multiple sclerosis Hyperlipidemia hx of c diff colitis Anxiety  Past Surgical History: Reviewed history from 04/30/2007 and no changes required. Denies surgical history  Family History: Reviewed history from 03/13/2007 and no changes required. Family History Diabetes 1st degree relative Family History Hypertension Family History Lung cancer - grandfather uncle with prostate cancer grandfather with stroke, MI mother with migraine  Social History: Reviewed history from 01/14/2009 and no changes required. Never Smoked Alcohol use-no Occupation: Housewife Daily Caffeine Use -3 Illicit Drug Use - no Married, 1 son 1 daughter  Vital Signs:  Patient profile:   35 year old female Height:      61 inches Weight:      207 pounds BMI:     39.25 Pulse rate:   68 / minute Pulse rhythm:   regular BP sitting:   112 / 78  (left arm)  Cuff size:   regular  Vitals Entered By: June McMurray CMA Duncan Dull) (May 18, 2009 3:53 PM)  Physical Exam  General:  obese, NAD Eyes:  anicteric Abdomen:  obese, mildly tender lowqer quadrants Skin:  facial flushing   Impression & Recommendations:  Problem # 1:  DIARRHEA (ICD-787.91) Assessment Unchanged Persistent problem with associated abdominal cramps. Sounds most like  IBS but has not respionded well to attempted therapy. IBD is possible. We discussed colonoscopy vs. further laboratory testing. Colonoscopy with terminal ileal intubation ought to be more definitive and this will be scheduled. Risks, benefits,and indications of endoscopic procedure(s) were reviewed with the patient and all questions answered.  Orders: Colonoscopy (Colon)  Problem # 2:  VITAMIN B12 DEFICIENCY (ICD-266.2) Assessment: Unchanged Raises ? of malabsorption fom something like Crohn's disease.  Problem # 3:  RASH-NONVESICULAR (ICD-782.1) Assessment: New New issue suspect reaction to Simcor which has niacin - she is to discuss with PCP  Patient Instructions: 1)  Please pick up your medications at your pharmacy.  2)  We will see you at your procedure on 06/09/09. 3)  [facility] Patient Information Guide given to patient.  4)  Colonoscopy and Flexible Sigmoidoscopy brochure given.  5)  Copy sent to : Antony Haste, MD 6)  The medication list was reviewed and reconciled.  All changed / newly prescribed medications were explained.  A complete medication list was provided to the patient / caregiver. Prescriptions: MOVIPREP 100 GM  SOLR (PEG-KCL-NACL-NASULF-NA ASC-C) As per prep instructions.  #1 x 0   Entered by:   Francee Piccolo CMA (AAMA)   Authorized by:   Iva Boop MD, [facility]   Signed by:   Francee Piccolo CMA (AAMA) on 05/18/2009   Method used:   Electronically to        [facility]* (retail)       [address] [place], [place]  [address]       Ph: [phone] or [phone]       Fax: [phone]   RxID:   [id number]

## 2010-05-20 NOTE — Assessment & Plan Note (Signed)
Summary: depo 70mos/b-12/john/cd   Nurse Visit   Allergies: 1)  ! Penicillin  Medication Administration  Injection # 1:    Medication: Vit B12 1000 mcg    Diagnosis: VITAMIN B12 DEFICIENCY (ICD-266.2)    Route: IM    Site: R deltoid    Exp Date: 10/17/2011    Lot #: [id number]    Mfr: American Regent    Patient tolerated injection without complications    Given by: Margaret Pyle, CMA (January 12, 2010 9:32 AM)  Injection # 2:    Medication: Depo-Provera 150mg     Diagnosis: CONTRACEPTIVE MANAGEMENT (ICD-V25.09)    Route: IM    Site: RUOQ gluteus    Exp Date: 06/16/2012    Lot #: [id number]    Mfr: Francisca December    Patient tolerated injection without complications    Given by: Margaret Pyle, CMA (January 12, 2010 9:33 AM)  Orders Added: 1)  Vit B12 1000 mcg [J3420] 2)  Depo-Provera 150mg  [J1055] 3)  Admin of Therapeutic Inj  intramuscular or subcutaneous [04540]

## 2010-05-20 NOTE — Letter (Signed)
Summary: [facility]   Imported By: Sherian Rein 04/05/2010 11:51:11  _____________________________________________________________________  External Attachment:    Type:   Image     Comment:   External Document

## 2010-05-20 NOTE — Assessment & Plan Note (Signed)
Summary: PER PT 1 MTH B12  JWJ  STC   Nurse Visit   Allergies: 1)  ! Penicillin  Medication Administration  Injection # 1:    Medication: Vit B12 1000 mcg    Diagnosis: VITAMIN B12 DEFICIENCY (ICD-266.2)    Route: IM    Site: L deltoid    Exp Date: 11/17/2011    Lot #: [id number]    Mfr: American Regent    Patient tolerated injection without complications    Given by: Margaret Pyle, CMA (March 18, 2010 8:25 AM)  Orders Added: 1)  Admin of Therapeutic Inj  intramuscular or subcutaneous [96372] 2)  Vit B12 1000 mcg [J3420]

## 2010-05-20 NOTE — Progress Notes (Signed)
Summary: Triage   Phone Note Call from Patient Call back at [phone]   Caller: Patient Call For: Dr. Leone Payor Reason for Call: Talk to Nurse Summary of Call: IBS symptoms is bothering her again Initial call taken by: Leanor Kail [facility],  March 18, 2010 1:08 PM  Follow-up for Phone Call        Patient states she is having symptoms for her IBS: diarrhea, cramping, bloating, and gas. Requests refill of med that was given to her in the past. Instructed patient we would refill the rx and that she needs to call if her symptoms do not get improve. Selinda Michaels, RN Follow-up by: Darcey Nora RN, CGRN,  March 18, 2010 3:39 PM  Additional Follow-up for Phone Call Additional follow up Details #1::        Per Nedra Hai at [facility] hyoscyamine products are covered and pt did not pick up rx yesterday.  He states dicyclomine is covered.  Can we change meds? Francee Piccolo CMA Duncan Dull)  March 19, 2010 9:22 AM     Additional Follow-up for Phone Call Additional follow up Details #2::    dicyclomine 20 mg every 6 hrs as needed # 90 3 refills rxed Iva Boop MD, [facility]  March 19, 2010 10:20 AM  Verified with pharmacy they have rx.  It will cost pt $2.50. LM to [facility] number Francee Piccolo CMA Duncan Dull)  March 19, 2010 11:58 AM   No return call from pt.  Per pharmacist she has picked up prescription.  Follow up ended. Follow-up by: Francee Piccolo CMA Duncan Dull),  March 23, 2010 10:46 AM  New/Updated Medications: LEVBID 0.375 MG XR12H-TAB (HYOSCYAMINE SULFATE) 1 by mouth two times a day as needed IBS problems (abdominal pain) DICYCLOMINE HCL 20 MG  TABS (DICYCLOMINE HCL) 1 by mouth every 6 hours as needed for abdominal pain Prescriptions: DICYCLOMINE HCL 20 MG  TABS (DICYCLOMINE HCL) 1 by mouth every 6 hours as needed for abdominal pain  #90 x 3   Entered and Authorized by:   Iva Boop MD, [facility]   Signed by:   Iva Boop MD, FACG on 03/19/2010   Method used:    Electronically to        [name] [id number] (retail)       [name] [place], [place]  [address]       Ph: [phone]       Fax: [phone]   RxID:   [id number] LEVBID 0.375 MG XR12H-TAB (HYOSCYAMINE SULFATE) 1 by mouth two times a day as needed IBS problems (abdominal pain)  #60 x 5   Entered and Authorized by:   Iva Boop MD, [facility]   Signed by:   Iva Boop MD, FACG on 03/18/2010   Method used:   Electronically to        [name] [id number]* (retail)       [name] [place], [place]  [address]       Ph: [phone]       Fax: [phone]   RxID:   [id number]

## 2010-05-20 NOTE — Assessment & Plan Note (Signed)
Summary: CPX/ NWS #   Vital Signs:  Patient profile:   35 year old female Height:      61 inches Weight:      205.50 pounds BMI:     38.97 O2 Sat:      98 % on Room air Temp:     99.7 degrees F oral Pulse rate:   76 / minute BP sitting:   100 / 72  (left arm) Cuff size:   large  Vitals Entered By: Zella Ball Ewing CMA Duncan Dull) (January 13, 2010 8:32 AM)  O2 Flow:  Room air  Preventive Care Screening  Pap Smear:    Date:  04/18/2009    Results:  normal   CC: ADult Physical/RE/wellness   Primary Care Tallyn Holroyd:  Antony Haste, MD  CC:  ADult Physical/RE/wellness.  History of Present Illness: here for wellness and f/u , on new simvatastain  and doing well;  c/o CP intermittent, midl to mod, sharp, with radiation to the left neck,  but not assoc with sob, n/v, diaphrosis, or dizziness, but did have occasional heart fluttering.  Overall lasted approx 1 min, but happens several times per day,  not pleuritic, and not sure if exertional, but did seem to happen at times she did exert herself.  Pt denies other CP, worsening sob, doe, wheezing, orthopnea, pnd, worsening LE edema, palps, dizziness or syncope  Pt denies new neuro symptoms such as headache, facial or extremity weakness .  Would like Vit d checked as she read low vit d might be related to MS, sees neuro on a regualar basis but Dr Thad Ranger left the practice, so she is now in transisiton to new neurology.  No fever, wt loss, night sweats, loss of appetite or other constitutional symptoms  Unaware of any temp todya. no ST, cough, bowel or bladder changes.  Trying to follow better lower chol, lower fat diet since her last blood work wtih TG > 400  Here for wellness Diet: Heart Healthy or DM if diabetic Physical Activities: Sedentary Depression/mood screen: Negative Hearing: Intact bilateral Visual Acuity: Grossly normal, gets exam yearly ADL's: Capable  Fall Risk: None Home Safety: Good Cognitive Impairment:  Gen appearance,  affect, speech, memory, attention & motor skills grossly intact End-of-Life Planning: Advance directive - Full code/I agree   Problems Prior to Update: 1)  Contraceptive Management  (ICD-V25.09) 2)  Neck Pain  (ICD-723.1) 3)  Uti  (ICD-599.0) 4)  Obstructive Sleep Apnea  (ICD-327.23) 5)  Diarrhea  (ICD-787.91) 6)  Hypersomnia Unspecified  (ICD-780.54) 7)  Bronchitis, Acute  (ICD-466.0) 8)  Headache  (ICD-784.0) 9)  Sinusitis, Acute  (ICD-461.9) 10)  Anxiety  (ICD-300.00) 11)  Chest Pain  (ICD-786.50) 12)  Localized Superficial Swelling Mass or Lump  (ICD-782.2) 13)  Rash-nonvesicular  (ICD-782.1) 14)  Lipoma  (ICD-214.9) 15)  Essential Hypertension  (ICD-401.9) 16)  Clostridium Difficile Colitis, Hx of  (ICD-V12.79) 17)  Hyperlipidemia  (ICD-272.4) 18)  Multiple Sclerosis  (ICD-340) 19)  Allergic Rhinitis  (ICD-477.9) 20)  Vitamin B12 Deficiency  (ICD-266.2) 21)  Gerd  (ICD-530.81)  Medications Prior to Update: 1)  Lyrica 75 Mg Caps (Pregabalin) .... Two Times A Day 2)  Nortriptyline Hcl 50 Mg Caps (Nortriptyline Hcl) .... Take 2 Capsule By Mouth At Bedtime 3)  Zegerid 40-1100 Mg Caps (Omeprazole-Sodium Bicarbonate) .Marland Kitchen.. 1 By Mouth Once Daily 4)  Cetirizine Hcl 10 Mg  Tabs (Cetirizine Hcl) .Marland Kitchen.. 1 By Mouth Once Daily Prn 5)  Depo-Provera 150 Mg/ml Im Susp (Medroxyprogesterone Acetate) .Marland KitchenMarland KitchenMarland Kitchen  1 Q 3 Months 6)  Furosemide 40 Mg  Tabs (Furosemide) .Marland Kitchen.. 1 By Mouth Once Daily 7)  Metoprolol Tartrate 100 Mg  Tabs (Metoprolol Tartrate) .Marland Kitchen.. 1 By Mouth Two Times A Day 8)  Yl Vitamin B-12 Cr 1000 Mcg  Tbcr (Cyanocobalamin) .Marland Kitchen.. 1 Inj Q Mon. 9)  Klor-Con M10 10 Meq  Tbcr (Potassium Chloride Crys Cr) .Marland Kitchen.. 1 By Mouth Once Daily 10)  Rebif 22 Mcg/0.42ml Soln (Interferon Beta-1a) .... Three Times Weekly 11)  Imodium A-D 2 Mg Tabs (Loperamide Hcl) .... As Needed For Diarhea 12)  Simcor 1000-40 Mg Xr24h-Tab (Niacin-Simvastatin) .... At Bedtime 13)  Hyoscyamine Sulfate Cr 0.375 Mg Xr12h-Tab  (Hyoscyamine Sulfate) .Marland Kitchen.. 1 By Mouth Two Times A Day 14)  Tramadol Hcl 50 Mg Tabs (Tramadol Hcl) .Marland Kitchen.. 1 - 2 By Mouth Q 6 Hrs As Needed Pain 15)  Diazepam 5 Mg Tabs (Diazepam) .Marland Kitchen.. 1po  Two Times A Day As Needed Spasm 16)  Flexeril 5 Mg Tabs (Cyclobenzaprine Hcl) .Marland Kitchen.. 1 By Mouth Three Times A Day As Needed  Current Medications (verified): 1)  Lyrica 75 Mg Caps (Pregabalin) .... Two Times A Day 2)  Nortriptyline Hcl 50 Mg Caps (Nortriptyline Hcl) .... Take 2 Capsule By Mouth At Bedtime 3)  Zegerid 40-1100 Mg Caps (Omeprazole-Sodium Bicarbonate) .Marland Kitchen.. 1 By Mouth Once Daily 4)  Cetirizine Hcl 10 Mg  Tabs (Cetirizine Hcl) .Marland Kitchen.. 1 By Mouth Once Daily Prn 5)  Depo-Provera 150 Mg/ml Im Susp (Medroxyprogesterone Acetate) .Marland Kitchen.. 1 Q 3 Months 6)  Furosemide 40 Mg  Tabs (Furosemide) .Marland Kitchen.. 1 By Mouth Once Daily 7)  Metoprolol Tartrate 100 Mg  Tabs (Metoprolol Tartrate) .Marland Kitchen.. 1 By Mouth Two Times A Day 8)  Yl Vitamin B-12 Cr 1000 Mcg  Tbcr (Cyanocobalamin) .Marland Kitchen.. 1 Inj Q Mon. 9)  Klor-Con M10 10 Meq  Tbcr (Potassium Chloride Crys Cr) .Marland Kitchen.. 1 By Mouth Once Daily 10)  Imodium A-D 2 Mg Tabs (Loperamide Hcl) .... As Needed For Diarhea 11)  Simvastatin 40 Mg Tabs (Simvastatin) .Marland Kitchen.. 1po Once Daily 12)  Tramadol Hcl 50 Mg Tabs (Tramadol Hcl) .Marland Kitchen.. 1 - 2 By Mouth Q 6 Hrs As Needed Pain 13)  Diazepam 5 Mg Tabs (Diazepam) .Marland Kitchen.. 1po  Two Times A Day As Needed Spasm  Allergies (verified): 1)  ! Penicillin  Past History:  Family History: Last updated: 03/13/2007 Family History Diabetes 1st degree relative Family History Hypertension Family History Lung cancer - grandfather uncle with prostate cancer grandfather with stroke, MI mother with migraine  Social History: Last updated: 01/13/2010 Never Smoked Alcohol use-no Occupation: Housewife Daily Caffeine Use -3 Illicit Drug Use - no Married, 1 son 1 daughter disabled since 2009 - MS  Risk Factors: Alcohol Use: 0 (11/18/2008)  Risk Factors: Smoking Status:  never (11/18/2008)  Past Medical History: GERD fibromylgia heart murmmer chronic fatigue cts B12 deficiency Allergic rhinitis multiple sclerosis Hyperlipidemia hx of c diff colitis Anxiety  MD roster:  neuro; in transition                   GI: Dr Leone Payor                   Pulm:   Dr Shelle Iron  Past Surgical History: Reviewed history from 04/30/2007 and no changes required. Denies surgical history  Family History: Reviewed history from 03/13/2007 and no changes required. Family History Diabetes 1st degree relative Family History Hypertension Family History Lung cancer - grandfather uncle with prostate cancer grandfather with stroke, MI  mother with migraine  Social History: Reviewed history from 01/14/2009 and no changes required. Never Smoked Alcohol use-no Occupation: Housewife Daily Caffeine Use -3 Illicit Drug Use - no Married, 1 son 1 daughter disabled since 2009 - MS  Review of Systems  The patient denies anorexia, fever, weight loss, vision loss, decreased hearing, hoarseness, syncope, dyspnea on exertion, peripheral edema, prolonged cough, headaches, hemoptysis, abdominal pain, melena, hematochezia, severe indigestion/heartburn, hematuria, muscle weakness, suspicious skin lesions, transient blindness, difficulty walking, unusual weight change, abnormal bleeding, enlarged lymph nodes, and angioedema.         all otherwise negative per pt -  except for ongoing CTS like dysethesias to the UE's  Physical Exam  General:  alert and overweight-appearing.   Head:  normocephalic and atraumatic.   Eyes:  vision grossly intact and pupils equal.   Ears:  R ear normal and L ear normal.   Nose:  no external deformity and no nasal discharge.   Mouth:  no gingival abnormalities and pharynx pink and moist.   Neck:  supple and no masses.   Lungs:  normal respiratory effort and normal breath sounds.   Heart:  normal rate and regular rhythm.   Abdomen:  soft, non-tender,  and normal bowel sounds.   Msk:  no joint tenderness and no joint swelling.   Extremities:  no edema, no erythema  Neurologic:  alert & oriented X3, cranial nerves II-XII intact, and strength normal in all extremities except mild chronic RUE weakness, DTR's intact, gait ok; cognitive intact to orientation, recall, naming, and repetition  Skin:  color normal and no rashes.   Psych:  not depressed appearing and moderately anxious.     Impression & Recommendations:  Problem # 1:  Preventive Health Care (ICD-V70.0)  Overall doing well, age appropriate education and counseling updated and referral for appropriate preventive services done unless declined, immunizations up to date or declined, diet counseling done if overweight, urged to quit smoking if smokes , most recent labs reviewed and current ordered if appropriate, ecg reviewed or declined (interpretation per ECG scanned in the EMR if done); information regarding Medicare Prevention requirements given if appropriate; speciality referrals updated as appropriate   Orders: Medicare -1st Annual Wellness Visit [phone])  Problem # 2:  CHEST PAIN (ICD-786.50)  atypical, doubt cardiac, suspect MSK but will check ECG and CXR to further evaluate; do not feel she need stress test at this time  Orders: EKG w/ Interpretation (93000) T-2 View CXR, Same Day (71020.5TC)  Problem # 3:  CARPAL TUNNEL SYNDROME, BILATERAL (ICD-354.0) mod to severe, right > left per pt based on last EMG/NCS just done since aug 23, but Dr Thad Ranger has left the neuro practice locally and not sure if this was followed closely;  Still with symptoms not improved with her splints;  will call for results, and consider NS referral if abnormal  Problem # 4:  HYPERLIPIDEMIA (ICD-272.4)  Her updated medication list for this problem includes:    Simvastatin 40 Mg Tabs (Simvastatin) .Marland Kitchen... 1po once daily with elev TG - to cont the statin, and attempt wt loss with low fat diet as well  to better lower the TG (which are better since the last [facility] TG of > 400 earlier this yr)  Labs Reviewed: SGOT: 25 (01/12/2010)   SGPT: 23 (01/12/2010)   HDL:40.60 (01/12/2010), 35.20 (10/24/2008)  Chol:153 (01/12/2010), 192 (10/24/2008)  Trig:264.0 (01/12/2010), 327.0 (10/24/2008)  Problem # 5:  VITAMIN D DEFICIENCY (ICD-268.9)  to check level  Orders:  T-Vitamin D (25-Hydroxy) [phone])  Problem # 6:  ESSENTIAL HYPERTENSION (ICD-401.9)  Her updated medication list for this problem includes:    Furosemide 40 Mg Tabs (Furosemide) .Marland Kitchen... 1 by mouth once daily    Metoprolol Tartrate 100 Mg Tabs (Metoprolol tartrate) .Marland Kitchen... 1 by mouth two times a day  BP today: 100/72 Prior BP: 110/80 (07/09/2009)  Labs Reviewed: K+: 4.1 (01/12/2010) Creat: : 0.8 (01/12/2010)   Chol: 153 (01/12/2010)   HDL: 40.60 (01/12/2010)   TG: 264.0 (01/12/2010) stable overall by hx and exam, ok to continue meds/tx as is   Problem # 7:  MULTIPLE SCLEROSIS (ICD-340) pt is in process of transiition to new neurologist with assist of [facility]  Complete Medication List: 1)  Lyrica 75 Mg Caps (Pregabalin) .... Two times a day 2)  Nortriptyline Hcl 50 Mg Caps (Nortriptyline hcl) .... Take 2 capsule by mouth at bedtime 3)  Zegerid 40-1100 Mg Caps (Omeprazole-sodium bicarbonate) .Marland Kitchen.. 1 by mouth once daily 4)  Cetirizine Hcl 10 Mg Tabs (Cetirizine hcl) .Marland Kitchen.. 1 by mouth once daily prn 5)  Depo-provera 150 Mg/ml Im Susp (Medroxyprogesterone acetate) .Marland Kitchen.. 1 q 3 months 6)  Furosemide 40 Mg Tabs (Furosemide) .Marland Kitchen.. 1 by mouth once daily 7)  Metoprolol Tartrate 100 Mg Tabs (Metoprolol tartrate) .Marland Kitchen.. 1 by mouth two times a day 8)  Yl Vitamin B-12 Cr 1000 Mcg Tbcr (Cyanocobalamin) .Marland Kitchen.. 1 inj q mon. 9)  Klor-con M10 10 Meq Tbcr (Potassium chloride crys cr) .Marland Kitchen.. 1 by mouth once daily 10)  Imodium A-d 2 Mg Tabs (Loperamide hcl) .... As needed for diarhea 11)  Simvastatin 40 Mg Tabs (Simvastatin) .Marland Kitchen.. 1po once  daily 12)  Tramadol Hcl 50 Mg Tabs (Tramadol hcl) .Marland Kitchen.. 1 - 2 by mouth q 6 hrs as needed pain 13)  Diazepam 5 Mg Tabs (Diazepam) .Marland Kitchen.. 1po  two times a day as needed spasm  Other Orders: Flu Vaccine 54yrs + MEDICARE PATIENTS (Q6578) Administration Flu vaccine - MCR (I6962)  Patient Instructions: 1)  Please go to the Lab in the basement for your blood  tests today  - the Vit D 2)  you had the flu shot today 3)  Please go to Radiology in the basement level for your X-Ray today  4)  Continue all previous medications as before this visit 5)  Please call the number on the [facility] Card for results of your testing  6)  Please call in 1 wk if you don't hear from [name] about the results of your most recent Nerve Test for the arms , and the need for referral 7)  Please schedule a follow-up appointment in 6 months, or sooner if needed    Flu Vaccine Consent Questions     Do you have a history of severe allergic reactions to this vaccine? no    Any prior history of allergic reactions to egg and/or gelatin? no    Do you have a sensitivity to the preservative Thimersol? no    Do you have a past history of Guillan-Barre Syndrome? no    Do you currently have an acute febrile illness? no    Have you ever had a severe reaction to latex? no    Vaccine information given and explained to patient? yes    Are you currently pregnant? no    Lot Number:[id number]   Exp Date:10/16/2010   Site Given  Left Deltoid IMlu

## 2010-05-20 NOTE — Assessment & Plan Note (Signed)
Summary: DEPO/JWJ Natale Milch   Nurse Visit   Allergies: 1)  ! Penicillin Laboratory Results   Urine Tests      Urine HCG: negative    Medication Administration  Injection # 1:    Medication: Depo-Provera 150mg     Diagnosis: CONTRACEPTIVE MANAGEMENT (ICD-V25.09)    Route: IM    Site: LUOQ gluteus    Exp Date: 08/16/2012    Lot #: [id number]    Mfr: Francisca December    Patient tolerated injection without complications    Given by: Margaret Pyle, CMA (April 14, 2010 9:30 AM)  Orders Added: 1)  Admin of Therapeutic Inj  intramuscular or subcutaneous [96372] 2)  Depo-Provera 150mg  [J1055] 3)  Urine Pregnancy Test  [81025]

## 2010-05-20 NOTE — Progress Notes (Signed)
Summary: Records request from [facility]  Request for records received from [facility]. Request forwarded to [employer]. Wilder Glade  June 11, 2009 4:22 PM

## 2010-05-20 NOTE — Progress Notes (Signed)
Summary: Record request  Request for records received from [facility]. Request forwarded to [employer]. Dena Chavis  May 07, 2009 4:59 PM

## 2010-05-20 NOTE — Letter (Signed)
Summary: [facility] Instructions  [facility]  [address] [place], [place] [address]   Phone: [phone]  Fax: [phone]       Courtney [name]    1975-07-21    MRN: [id number]        Procedure Day Dorna Bloom: Jake Shark, 06/09/09     Arrival Time: 2:30 PM      Procedure Time: 3:30 PM     Location of Procedure:                    _X_   [facility] Center (4th Floor)                       PREPARATION FOR COLONOSCOPY WITH MOVIPREP   Starting 5 days prior to your procedure 06/04/09 do not eat nuts, seeds, popcorn, corn, beans, peas,  salads, or any raw vegetables.  Do not take any fiber supplements (e.g. Metamucil, Citrucel, and Benefiber).  THE DAY BEFORE YOUR PROCEDURE         DATE: 06/08/09        DAY: MONDAY  1.  Drink clear liquids the entire day-NO SOLID FOOD  2.  Do not drink anything colored red or purple.  Avoid juices with pulp.  No orange juice.  3.  Drink at least 64 oz. (8 glasses) of fluid/clear liquids during the day to prevent dehydration and help the prep work efficiently.  CLEAR LIQUIDS INCLUDE: Water Jello Ice Popsicles Tea (sugar ok, no milk/cream) Powdered fruit flavored drinks Coffee (sugar ok, no milk/cream) Gatorade Juice: apple, white grape, white cranberry  Lemonade Clear bullion, consomm, broth Carbonated beverages (any kind) Strained chicken noodle soup Hard Candy                             4.  In the morning, mix first dose of MoviPrep solution:    Empty 1 Pouch A and 1 Pouch B into the disposable container    Add lukewarm drinking water to the top line of the container. Mix to dissolve    Refrigerate (mixed solution should be used within 24 hrs)  5.  Begin drinking the prep at 5:00 p.m. The MoviPrep container is divided by 4 marks.   Every 15 minutes drink the solution down to the next mark (approximately 8 oz) until the full liter is complete.   6.  Follow completed prep with 16 oz of clear liquid of your choice  (Nothing red or purple).  Continue to drink clear liquids until bedtime.  7.  Before going to bed, mix second dose of MoviPrep solution:    Empty 1 Pouch A and 1 Pouch B into the disposable container    Add lukewarm drinking water to the top line of the container. Mix to dissolve    Refrigerate  THE DAY OF YOUR PROCEDURE      DATE: 06/09/09     DAY: TUESDAY  Beginning at 10:30a.m. (5 hours before procedure):         1. Every 15 minutes, drink the solution down to the next mark (approx 8 oz) until the full liter is complete.  2. Follow completed prep with 16 oz. of clear liquid of your choice.    3. You may drink clear liquids until 1:30PM  (2 HOURS BEFORE PROCEDURE).   MEDICATION INSTRUCTIONS  Unless otherwise instructed, you should take regular prescription medications with a small sip of water  as early as possible the morning of your procedure.  Additional medication instructions: NONE         OTHER INSTRUCTIONS  You will need a responsible adult at least 34 years of age to accompany you and drive you home.   This person must remain in the waiting room during your procedure.  Wear loose fitting clothing that is easily removed.  Leave jewelry and other valuables at home.  However, you may wish to bring a book to read or  an iPod/MP3 player to listen to music as you wait for your procedure to start.  Remove all body piercing jewelry and leave at home.  Total time from sign-in until discharge is approximately 2-3 hours.  You should go home directly after your procedure and rest.  You can resume normal activities the  day after your procedure.  The day of your procedure you should not:   Drive   Make legal decisions   Operate machinery   Drink alcohol   Return to work  You will receive specific instructions about eating, activities and medications before you leave.    The above instructions have been reviewed and explained to me by    _______________________    I fully understand and can verbalize these instructions _____________________________ Date _________

## 2010-05-20 NOTE — Assessment & Plan Note (Signed)
Summary: B12 Sammuel Cooper Natale Milch   Nurse Visit   Allergies: 1)  ! Penicillin  Medication Administration  Injection # 1:    Medication: Vit B12 1000 mcg    Diagnosis: VITAMIN D DEFICIENCY (ICD-268.9)    Route: IM    Site: R deltoid    Exp Date: 09/17/2011    Lot #: [id number]    Mfr: American Regent    Patient tolerated injection without complications    Given by: Jarome Lamas (February 16, 2010 9:36 AM)  Orders Added: 1)  Vit B12 1000 mcg [J3420] 2)  Admin of Therapeutic Inj  intramuscular or subcutaneous [[id number]]

## 2010-05-20 NOTE — Assessment & Plan Note (Signed)
Summary: DEPO SHOT & B12 SHOT/CD   Nurse Visit   Allergies: 1)  ! Penicillin  Medication Administration  Injection # 1:    Route: IM    Site: L deltoid    Patient tolerated injection without complications    Given by: Margaret Pyle, CMA (July 15, 2009 9:42 AM) Sydell Axon SMA  Injection # 2:    Route: IM  Injection # 3:    Medication: Depo-Provera 150mg   Orders Added: 1)  Vit B12 1000 mcg [J3420] 2)  Admin of Therapeutic Inj  intramuscular or subcutaneous [96372]      Appended Document: DEPO SHOT & B12 SHOT/CD     Clinical Lists Changes  Orders: Added new Service order of Admin of Therapeutic Inj  intramuscular or subcutaneous (98119) - Signed Added new Service order of Depo-Provera 150mg  (J4782) - Signed       Medication Administration  Injection # 2:    Medication: Depo-Provera 150mg     Diagnosis: CONTRACEPTIVE MANAGEMENT (ICD-V25.09)    Route: IM    Site: RUOQ gluteus    Exp Date: 08/17/2011    Lot #: [id number]    Mfr: Illene Silver    Patient tolerated injection without complications    Given by: Margaret Pyle, CMA (July 15, 2009 2:10 PM)/ Sydell Axon SMA  Orders Added: 1)  Admin of Therapeutic Inj  intramuscular or subcutaneous [96372] 2)  Depo-Provera 150mg  [J1055]

## 2010-05-20 NOTE — Procedures (Signed)
Summary: Colonoscopy  Patient: [name] Note: All result statuses are Final unless otherwise noted.  Tests: (1) Colonoscopy (COL)   COL Colonoscopy           DONE     [facility]     [address]     [place], [place]  [address]           COLONOSCOPY PROCEDURE REPORT           PATIENT:  Courtney [name], Courtney [name]  MR#:  [id number]     BIRTHDATE:  [birth year], 34 yrs. old  GENDER:  female           ENDOSCOPIST:  Iva Boop, MD, [facility]           PROCEDURE DATE:  06/09/2009     PROCEDURE:  Colonoscopy with biopsy and snare polypectomy     ASA CLASS:  Class II     INDICATIONS:  unexplained diarrhea           MEDICATIONS:   Fentanyl 50 mcg IV, Versed 5 mg IV           DESCRIPTION OF PROCEDURE:   After the risks benefits and     alternatives of the procedure were thoroughly explained, informed     consent was obtained.  Digital rectal exam was performed and     revealed no abnormalities.   The LB [id number] endoscope     was introduced through the anus and advanced to the terminal ileum     which was intubated for a short distance, without limitations.     The quality of the prep was excellent, using MoviPrep.  The     instrument was then slowly withdrawn as the colon was fully     examined.     Insertion: 2:33 minutes Withdrawal: 7:16 minutes     <<PROCEDUREIMAGES>>     FINDINGS:  The terminal ileum appeared normal.  A diminutive polyp     was found in the descending colon. It was 3 - 4 mm in size. Polyp     was snared without cautery. Retrieval was successful. This was     otherwise a normal examination of the colon. Random biopsi     es were obtained and sent to pathology.   Retroflexed views in the     rectum revealed no abnormalities.    The scope was then withdrawn     from the patient and the procedure completed.           COMPLICATIONS:  None           ENDOSCOPIC IMPRESSION:     1) Normal terminal ileum     2) 3 - 4 mm diminutive polyp in the descending  colon - removed     3) Otherwise normal examination of the colon and rectum - random     biopsies taken to look for cause of diarrhea     RECOMMENDATIONS:     1) Await biopsy results - will call patient           REPEAT EXAM:  In for Colonoscopy, pending biopsy results.           Iva Boop, MD, [facility]           CC:  The Patient     Antony Haste, MD     Kelli Hope, MD           n.     eSIGNED:  Iva Boop at 06/09/2009 03:33 PM           Allen Derry, [id number]  Note: An exclamation mark (!) indicates a result that was not dispersed into the flowsheet. Document Creation Date: 06/09/2009 3:34 PM _______________________________________________________________________  (1) Order result status: Final Collection or observation date-time: 06/09/2009 15:25 Requested date-time:  Receipt date-time:  Reported date-time:  Referring Physician:   Ordering Physician: Stan Head [phone]) Specimen Source:  Source: Launa Grill Order Number: [id number] Lab site:

## 2010-05-20 NOTE — Assessment & Plan Note (Signed)
Summary: 3 MTH DEPO PER PT --JWJ  [facility]   Nurse Visit   Allergies: 1)  ! Penicillin  Medication Administration  Injection # 1:    Medication: Depo-Provera 150mg     Diagnosis: CONTRACEPTIVE MANAGEMENT (ICD-V25.09)    Route: IM    Site: R deltoid    Exp Date: 08/17/2011    Lot #: [id number]    Mfr: Illene Silver    Patient tolerated injection without complications    Given by: Margaret Pyle, CMA (October 13, 2009 9:30 AM)  Orders Added: 1)  Admin of Therapeutic Inj  intramuscular or subcutaneous [96372] 2)  Depo-Provera 150mg  [J1055]

## 2010-05-20 NOTE — Letter (Signed)
Summary: [facility]  [facility]   Imported By: Sherian Rein 12/14/2009 09:44:35  _____________________________________________________________________  External Attachment:    Type:   Image     Comment:   External Document

## 2010-05-20 NOTE — Consult Note (Signed)
Summary: [facility]   [facility]   Imported By: Lennie Odor 02/18/2010 13:55:30  _____________________________________________________________________  External Attachment:    Type:   Image     Comment:   External Document

## 2010-05-20 NOTE — Progress Notes (Signed)
----   Converted from flag ---- ---- 01/13/2010 5:12 PM, Corwin Levins MD wrote: please let pt know we will refer to neurosurg as we discussed for the CTS (we did get the results of the test) ------------------------------  called pt left msg. to call back.  Called pt informed of above information.

## 2010-05-20 NOTE — Assessment & Plan Note (Signed)
Summary: b12 shot/Rebakah Cokley/cd--will be here at 3:30/ cd   Nurse Visit   Allergies: 1)  ! Penicillin  Medication Administration  Injection # 1:    Medication: Vit B12 1000 mcg    Diagnosis: VITAMIN B12 DEFICIENCY (ICD-266.2)    Route: IM    Site: L deltoid    Exp Date: 07/18/2011    Lot #: [id number]    Mfr: American Regent    Patient tolerated injection without complications    Given by: Margaret Pyle, CMA (December 11, 2009 3:22 PM)  Orders Added: 1)  Admin of Therapeutic Inj  intramuscular or subcutaneous [96372] 2)  Vit B12 1000 mcg [J3420]

## 2010-05-20 NOTE — Miscellaneous (Signed)
   Clinical Lists Changes  Problems: Added new problem of CONTRACEPTIVE MANAGEMENT (ICD-V25.09)

## 2010-05-21 ENCOUNTER — Ambulatory Visit: Admit: 2010-05-21 | Payer: Self-pay | Admitting: Internal Medicine

## 2010-05-21 ENCOUNTER — Encounter: Payer: Self-pay | Admitting: Internal Medicine

## 2010-05-21 ENCOUNTER — Ambulatory Visit (INDEPENDENT_AMBULATORY_CARE_PROVIDER_SITE_OTHER): Payer: Medicare Other

## 2010-05-21 DIAGNOSIS — E538 Deficiency of other specified B group vitamins: Secondary | ICD-10-CM

## 2010-05-24 ENCOUNTER — Encounter: Payer: Self-pay | Admitting: Internal Medicine

## 2010-05-24 ENCOUNTER — Other Ambulatory Visit: Payer: Medicare Other

## 2010-05-24 ENCOUNTER — Ambulatory Visit (INDEPENDENT_AMBULATORY_CARE_PROVIDER_SITE_OTHER): Payer: Medicare Other | Admitting: Internal Medicine

## 2010-05-24 ENCOUNTER — Encounter (INDEPENDENT_AMBULATORY_CARE_PROVIDER_SITE_OTHER): Payer: Self-pay | Admitting: *Deleted

## 2010-05-24 DIAGNOSIS — K589 Irritable bowel syndrome without diarrhea: Secondary | ICD-10-CM

## 2010-05-24 DIAGNOSIS — R197 Diarrhea, unspecified: Secondary | ICD-10-CM

## 2010-05-24 HISTORY — DX: Irritable bowel syndrome, unspecified: K58.9

## 2010-05-25 ENCOUNTER — Encounter: Payer: Self-pay | Admitting: Internal Medicine

## 2010-05-26 NOTE — Assessment & Plan Note (Signed)
Summary: B-12 JWJ   Nurse Visit   Allergies: 1)  ! Penicillin  Medication Administration  Injection # 1:    Medication: Vit B12 1000 mcg    Diagnosis: VITAMIN B12 DEFICIENCY (ICD-266.2)    Route: IM    Site: L deltoid    Exp Date: 02/17/2012    Lot #: [id number]    Mfr: American Regent    Patient tolerated injection without complications    Given by: Margaret Pyle, CMA (May 21, 2010 10:37 AM)  Orders Added: 1)  Admin of Therapeutic Inj  intramuscular or subcutaneous [96372] 2)  Vit B12 1000 mcg [J3420]

## 2010-06-01 ENCOUNTER — Telehealth: Payer: Self-pay | Admitting: Internal Medicine

## 2010-06-02 ENCOUNTER — Telehealth: Payer: Self-pay | Admitting: Internal Medicine

## 2010-06-03 NOTE — Assessment & Plan Note (Signed)
Summary: IBS NOT RESPONDING TO MEDICATION.Courtney [name] W PT//CX POL ADVISED   Vital Signs:  Patient profile:   35 year old female Height:      61 inches Weight:      213.13 pounds BMI:     40.42 Pulse rate:   90 / minute Pulse rhythm:   regular BP sitting:   116 / 72  (right arm) Cuff size:   large  Vitals Entered By: Christie Nottingham CMA Duncan Dull) (May 24, 2010 8:31 AM)   History of Present Illness Visit Type: Follow-up Visit Primary GI MD: Stan Head MD [facility] Primary Provider: Antony Haste, MD Requesting Provider: Antony Haste, MD Chief Complaint: More IBS issues. Pt states she has a lot of urgency after meals and in the middle of the night.  History of Present Illness:   35 o ww with IBS-D. recently prescribed dicyclomine when she called and at 20 mg dose says ineffective and stopped (took x 1-2 months). She says Immodium has not helped stop diarrhea, takng 2 after a loose stool and then 1 more.  Post-prandial cramps and bloating wth gas and then urgent defecation upon finishng eatiing. She says it is with every meal. Three times a week she will arise and defecate. During the day there is also defecation outside of meals and she will go about 5 times a day. Stools are always loose. She says they might be oily. Malodorous, tan and ? some orange in the stools.    GI Review of Systems    Reports abdominal pain.     Location of  Abdominal pain: generalized cramping.    Denies acid reflux, belching, bloating, chest pain, dysphagia with liquids, dysphagia with solids, heartburn, loss of appetite, nausea, vomiting, vomiting blood, weight loss, and  weight gain.      Reports diarrhea.     Denies anal fissure, black tarry stools, change in bowel habit, constipation, diverticulosis, fecal incontinence, heme positive stool, hemorrhoids, irritable bowel syndrome, jaundice, light color stool, liver problems, rectal bleeding, and  rectal pain.  Current Medications (verified): 1)  Lyrica  75 Mg Caps (Pregabalin) .... Two Times A Day 2)  Nortriptyline Hcl 50 Mg Caps (Nortriptyline Hcl) .... Take 2 Capsule By Mouth At Bedtime 3)  Zegerid 40-1100 Mg Caps (Omeprazole-Sodium Bicarbonate) .Marland Kitchen.. 1 By Mouth Once Daily 4)  Cetirizine Hcl 10 Mg  Tabs (Cetirizine Hcl) .Marland Kitchen.. 1 By Mouth Once Daily Prn 5)  Depo-Provera 150 Mg/ml Im Susp (Medroxyprogesterone Acetate) .Marland Kitchen.. 1 Q 3 Months 6)  Furosemide 40 Mg  Tabs (Furosemide) .Marland Kitchen.. 1 By Mouth Once Daily 7)  Metoprolol Tartrate 100 Mg  Tabs (Metoprolol Tartrate) .Marland Kitchen.. 1 By Mouth Two Times A Day 8)  Yl Vitamin B-12 Cr 1000 Mcg  Tbcr (Cyanocobalamin) .Marland Kitchen.. 1 Inj Q Mon. 9)  Klor-Con M10 10 Meq  Tbcr (Potassium Chloride Crys Cr) .Marland Kitchen.. 1 By Mouth Once Daily 10)  Simvastatin 40 Mg Tabs (Simvastatin) .Marland Kitchen.. 1po Once Daily  Allergies (verified): 1)  ! Penicillin  Past History:  Past Medical History: Reviewed history from 01/13/2010 and no changes required. GERD fibromylgia heart murmmer chronic fatigue cts B12 deficiency Allergic rhinitis multiple sclerosis Hyperlipidemia hx of c diff colitis Anxiety  MD roster:  neuro; in transition                   GI: Dr Leone Payor                   Pulm:   Dr Shelle Iron  Past Surgical History: Carpal Tunnel Release 01/2010  Family History: Reviewed history from 03/13/2007 and no changes required. Family History Diabetes 1st degree relative Family History Hypertension Family History Lung cancer - grandfather uncle with prostate cancer grandfather with stroke, MI mother with migraine  Social History: Reviewed history from 01/13/2010 and no changes required. Never Smoked Alcohol use-no Occupation: Housewife Daily Caffeine Use -3 Illicit Drug Use - no Married, 1 son 1 daughter disabled since 2009 - MS  Review of Systems       off therapy for MS - in observation mode due to side effects or lack of efficacy to therap. poor sleep : "toss and turn" + fatigue denies significant myalgias,  arthralgias  Physical Exam  General:  obese.  NAD Lungs:  Clear throughout to auscultation. Heart:  Regular rate and rhythm; no murmurs, rubs,  or bruits. Abdomen:  soft, non-tender, and normal bowel sounds.  obese Psych:  Alert and cooperative. Normal mood and affect.   Impression & Recommendations:  Problem # 1:  DIARRHEA (ICD-787.91) Assessment Unchanged I am most suspicous of IBS. In the past she had C. diff that responded to metronidazole (2003). Then was seen in 2005 for similar problems as today. UGISBFT negative, celiac antibodies (TTG) negative, ESR was 16 (top NL 15)  Anti-parietal and anti-intrinsic factor antibodies have been negative. Weight gain, lack of bleeding, anemia, other constitutional sxs. Diarrhea is essentially post-prandial only. It has responded well to Immodium in past butr not now and she says anti-spasmodics (hyoscyamine, dicyclomine, glycopyrrolate) have not provided consistent benefit. CBC, CMET, TSH, ok. C. diff toxin, Giarda/crypto and O&P negative in Aug 2010. A course of metrondazole 500 mg three times a day x 10 days did not seem to help (2010). Colonoscopy/ileoscopyin 2011 showed diminutive adenoma but no colitis including bxs.  Plan : checkfecal fat and elastase and if negative would try Lotroex - we  discussed possible side effects and she was given the form to sign  Orders: T-Pancreatic Fecal Elastase ([id number]) T-Stool Fats [place] Stain [phone])  Problem # 2:  IRRITABLE BOWEL SYNDROME (ICD-564.1) Assessment: New Not added to problem list in past but I believe this is her problem. (Diarrhea-predominant)  Patient Instructions: 1)  Your physician requests that you go to the basement floor of our office to have the following labwork completed before leaving today: Stool for qualitative fat ([place] stain), Fecal Elastase 1. 2)  We have given you a Lotronex handout to read and sign. 3)  We will discuss adding medications for your diarrhea should  all of your lab tests come back negative. 4)  The medication list was reviewed and reconciled.  All changed / newly prescribed medications were explained.  A complete medication list was provided to the patient / caregiver.   Orders Added: 1)  T-Pancreatic Fecal Elastase [[id number]] 2)  T-Stool Fats [place] Stain [[id number]]

## 2010-06-03 NOTE — Miscellaneous (Signed)
Summary: PT Acknowlegement Form / Lotronex  PT Acknowlegement Form / Lotronex   Imported By: Lennie Odor 05/28/2010 09:44:57  _____________________________________________________________________  External Attachment:    Type:   Image     Comment:   External Document

## 2010-06-09 NOTE — Progress Notes (Signed)
Summary: Lotronex rx  Medications Added LOTRONEX 0.5 MG TABS (ALOSETRON HCL) 1 by mouth two times a day       Phone Note Outgoing Call   Summary of Call: Stool tests suggest problem is IBS and not from pancreas I recommend she try Lotronex 0.5 mg two times a day we gave her info to read etc. she signed the agreement - it is in chart Rx created for her to try and needs follow-up 6 weeks Iva Boop MD, [facility]  June 02, 2010 10:10 AM   Follow-up for Phone Call        no message I will continue to try and reach the patient  Follow-up by: Darcey Nora RN, CGRN,  June 02, 2010 11:42 AM  Additional Follow-up for Phone Call Additional follow up Details #1::        Patient aware she is scheduled for a follow up appointment with Dr Leone Payor 07/13/10 10:30 Additional Follow-up by: Darcey Nora RN, CGRN,  June 03, 2010 9:54 AM    New/Updated Medications: LOTRONEX 0.5 MG TABS (ALOSETRON HCL) 1 by mouth two times a day Prescriptions: LOTRONEX 0.5 MG TABS (ALOSETRON HCL) 1 by mouth two times a day  #60 x 1   Entered and Authorized by:   Iva Boop MD, [facility]   Signed by:   Iva Boop MD, FACG on 06/02/2010   Method used:   Electronically to        [facility] (retail)       [address]       [place], [place]  [address]       Ph: [phone]       Fax: [phone]   RxID:   [id number]    Impression & Recommendations:  Problem # 1:  IRRITABLE BOWEL SYNDROME (ICD-564.1)

## 2010-06-09 NOTE — Progress Notes (Signed)
Summary: Test Results   Phone Note Call from Patient Call back at Home Phone [phone]   Call For: Dr Leone Payor Reason for Call: Lab or Test Results Initial call taken by: Leanor Kail [facility],  June 01, 2010 3:35 PM  Follow-up for Phone Call        patient advised of normal lab results Follow-up by: Darcey Nora RN, CGRN,  June 01, 2010 3:40 PM

## 2010-06-21 ENCOUNTER — Encounter: Payer: Self-pay | Admitting: Internal Medicine

## 2010-06-21 ENCOUNTER — Ambulatory Visit: Payer: Medicare Other

## 2010-06-21 ENCOUNTER — Ambulatory Visit (INDEPENDENT_AMBULATORY_CARE_PROVIDER_SITE_OTHER): Payer: Medicare Other | Admitting: Internal Medicine

## 2010-06-21 DIAGNOSIS — E538 Deficiency of other specified B group vitamins: Secondary | ICD-10-CM

## 2010-06-22 ENCOUNTER — Ambulatory Visit: Payer: Self-pay

## 2010-06-29 NOTE — Assessment & Plan Note (Signed)
Summary: b12 shot/Kristene Liberati   Nurse Visit   Allergies: 1)  ! Penicillin  Medication Administration  Injection # 1:    Medication: Vit B12 1000 mcg    Diagnosis: VITAMIN B12 DEFICIENCY (ICD-266.2)    Route: IM    Site: R deltoid    Exp Date: 02/2012    Lot #: [id number]    Mfr: American Regent    Patient tolerated injection without complications    Given by: Zella Ball Ewing CMA (AAMA) (June 21, 2010 9:27 AM)  Orders Added: 1)  Vit B12 1000 mcg [J3420] 2)  Admin of Therapeutic Inj  intramuscular or subcutaneous [16109]

## 2010-07-05 ENCOUNTER — Telehealth: Payer: Self-pay | Admitting: Internal Medicine

## 2010-07-09 ENCOUNTER — Telehealth: Payer: Self-pay | Admitting: Internal Medicine

## 2010-07-09 NOTE — Telephone Encounter (Signed)
Patient aware.  I will cancel the appointment for next week.

## 2010-07-09 NOTE — Telephone Encounter (Signed)
We can delay the appointment if that works and see me in next few months (at least annually). If she needs new rx let me know to do whatever I need. Refills can go through 12 months from last visit

## 2010-07-09 NOTE — Telephone Encounter (Signed)
Pt wanted Dr Leone Payor to know that she is doing better on BID Lotronex.  No problems.  Does she need to keep her appt with you next Tues.  No more diarrhea at this time.

## 2010-07-12 ENCOUNTER — Other Ambulatory Visit: Payer: Self-pay | Admitting: Internal Medicine

## 2010-07-12 NOTE — Telephone Encounter (Signed)
Pharmacy requesting refill on Lopressor

## 2010-07-13 ENCOUNTER — Ambulatory Visit: Payer: Medicare Other | Admitting: Internal Medicine

## 2010-07-14 ENCOUNTER — Telehealth: Payer: Self-pay | Admitting: Internal Medicine

## 2010-07-14 DIAGNOSIS — R197 Diarrhea, unspecified: Secondary | ICD-10-CM

## 2010-07-14 NOTE — Telephone Encounter (Signed)
Attempted to reach the patient and there is no machine.  I will continue to try and reach the patient

## 2010-07-15 ENCOUNTER — Ambulatory Visit (INDEPENDENT_AMBULATORY_CARE_PROVIDER_SITE_OTHER): Payer: Medicare Other | Admitting: Internal Medicine

## 2010-07-15 ENCOUNTER — Encounter: Payer: Self-pay | Admitting: Internal Medicine

## 2010-07-15 VITALS — BP 104/80 | HR 79 | Temp 99.0°F | Ht 61.0 in | Wt 213.0 lb

## 2010-07-15 DIAGNOSIS — M79609 Pain in unspecified limb: Secondary | ICD-10-CM

## 2010-07-15 DIAGNOSIS — Z0000 Encounter for general adult medical examination without abnormal findings: Secondary | ICD-10-CM | POA: Insufficient documentation

## 2010-07-15 DIAGNOSIS — Z309 Encounter for contraceptive management, unspecified: Secondary | ICD-10-CM

## 2010-07-15 DIAGNOSIS — M79646 Pain in unspecified finger(s): Secondary | ICD-10-CM

## 2010-07-15 DIAGNOSIS — M25569 Pain in unspecified knee: Secondary | ICD-10-CM

## 2010-07-15 MED ORDER — MEDROXYPROGESTERONE ACETATE 5 MG PO TABS: 5.0000 mg | ORAL_TABLET | Freq: Every day | ORAL | Status: DC

## 2010-07-15 NOTE — Assessment & Plan Note (Addendum)
Right thumb ? 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

## 2010-07-15 NOTE — Telephone Encounter (Signed)
I spoke with the patient diarrhea has returned as bad as it was prior.  Taking Lotronex 1 mg BID, now having about 6 diarrhea stools a day.

## 2010-07-15 NOTE — Assessment & Plan Note (Signed)
Office is currently out of depoprovera but should come in soon;  For now will do bridging tx with provera 5 mg per day , and call pt when able to provide the depoprovera IM

## 2010-07-15 NOTE — Progress Notes (Signed)
Summary: Triage   Phone Note Call from Patient Call back at Home Phone [phone]   Caller: Patient Call For: Drenda Freeze Reason for Call: Talk to Nurse Summary of Call: patient stopped taking the medication Dr. Leone Payor recommended because it was not working and she wants to speak with nurse about other options Initial call taken by: [place] Johnson,  July 05, 2010 10:29 AM  Follow-up for Phone Call        Patient called to report the Lotronex has stopped working and she stopped it 07/01/10. She stated the s&s she had before 05/24/10 visit have returned- urgency, bloating, abdominal pain below the belly button, etc. Suspected IBS and she was placed on Lotronex; Pan. Elastaste negative and Fecal Fat Qual. was normal. She remains on Zegerid. Please advise. Thanks. Follow-up by: Graciella Freer RN,  July 05, 2010 11:51 AM  Additional Follow-up for Phone Call Additional follow up Details #1::        she was on the lower dose - 1 mg two times a day is higher dose so if she was having symptoms despite 0.5 mg two times a day she could need 1 mg two times a day. she should try 2 tabs (0.5 x 2 = 1mg ) two times a day of lotronex and see what that does. If she develops abdominal pain and/or severe constipation she needs to stop it and let me know. If that is working then call back and we will change her Rx to 1 mg two times a day. Additional Follow-up by: Iva Boop MD, Clementeen Graham,  July 05, 2010 12:41 PM    Additional Follow-up for Phone Call Additional follow up Details #2::    Notified patient per Dr Leone Payor, try to increase the Lotronex to 0.5mg  tabs, 2 tabs two times a day. If she develops abdominal pain and/or severe constipation, she is to stop the Lotronex and call us. I asked her to call back Friday if no problems to update [name]. Patient stated understanding. Follow-up by: Graciella Freer RN,  July 05, 2010 2:52 PM

## 2010-07-15 NOTE — Progress Notes (Signed)
Subjective:    Patient ID: Courtney [name], female    DOB: [birth year], 35 y.o.   MRN: [id number]  HPI  Here to f/u;  C/o incidental 4 wks moderate , grad worsening pain to right thumb both at the base and the DIP, with mild swelling, and increased pain with click to attempting to flex the DIP.  No recent fever, wt loss, trauma or hx of gout or overuse.    Also left knee is much improved in severity of pain in that there is no pain any longer at rest as around the time of the last OV, but now only mild pain with longer walking or standing more than 15 min or so;  Does not seem to be worse than that, does not limit her activies, can tylenol for relief or avoid standing longer periods, but is assoc with swelling when she does that.  Currently not puffy or swollen, no giveaway, no sense of risk of fall or gait problem at this time.  MRI results reviewed with pt from last visit.    Also here for contraceptive management, has been on depoprovera for > 1 yr without wt increase or other signficnat side effect and wants to continue, is due today for her 90 day injection, but unfortunately we are out of med in the office today.  Pt would rather not change to BCP or other method at this time.  LMP > 1 yr, no current GYN or GU symptoms.    Past Medical History  Diagnosis Date  . GERD (gastroesophageal reflux disease)   . Fibromyalgia   . Heart murmur   . Chronic fatigue   . CTS (carpal tunnel syndrome)   . Anemia, B12 deficiency   . Multiple sclerosis   . Hyperlipidemia   . C. difficile colitis   . Anxiety   . Heart murmur   . VITAMIN D DEFICIENCY 01/13/2010  . VITAMIN B12 DEFICIENCY 03/13/2007  . OBSTRUCTIVE SLEEP APNEA 12/08/2008  . Irritable bowel syndrome 05/24/2010  . HYPERSOMNIA UNSPECIFIED 10/30/2008  . HYPERLIPIDEMIA 03/13/2007  . GERD 09/18/2006  . ESSENTIAL HYPERTENSION 03/13/2007  . CLOSTRIDIUM DIFFICILE COLITIS, HX OF 03/13/2007  . CARPAL TUNNEL SYNDROME, BILATERAL 01/13/2010  . ANXIETY  09/25/2007  . ALLERGIC RHINITIS 03/13/2007   Past Surgical History  Procedure Date  . Carpal tunnel release 01/2010    reports that she has never smoked. She does not have any smokeless tobacco history on file. She reports that she does not drink alcohol or use illicit drugs. family history includes Diabetes in her other; Heart attack in her maternal grandfather; Hypertension in her other; Lung cancer in her maternal grandmother; Migraines in her mother; Prostate cancer in her maternal uncle; and Stroke in her maternal grandfather. Allergies  Allergen Reactions  . Penicillins     REACTION: rash    Current Outpatient Prescriptions on File Prior to Visit  Medication Sig Dispense Refill  . cetirizine (ZYRTEC) 10 MG tablet Take 10 mg by mouth daily.        . Cyanocobalamin (YL VITAMIN B-12 CR) 1000 MCG TBCR Take by mouth. 1 injection every month       . furosemide (LASIX) 40 MG tablet Take 40 mg by mouth daily.        Marland Kitchen LOPRESSOR 100 MG tablet TAKE 1 TABLET BY MOUTH   TWICE DAILY.  60 each  10  . medroxyPROGESTERone (DEPO-PROVERA) 150 MG/ML injection Inject 150 mg into the muscle every 3 (three) months.        Marland Kitchen  nortriptyline (PAMELOR) 50 MG capsule Take 50 mg by mouth 2 (two) times daily. At bedtime       . omeprazole-sodium bicarbonate (ZEGERID) 40-1100 MG per capsule Take 1 capsule by mouth daily.        . potassium chloride SA (KLOR-CON M10) 10 MEQ tablet Take 10 mEq by mouth daily.        . pregabalin (LYRICA) 75 MG capsule Take 75 mg by mouth 2 (two) times daily.        . simvastatin (ZOCOR) 40 MG tablet Take 40 mg by mouth. daily       . DISCONTD: diazepam (VALIUM) 5 MG tablet Take 5 mg by mouth. 1 po two times a day as needed spasm       . DISCONTD: dicyclomine (BENTYL) 20 MG tablet Take 20 mg by mouth every 6 (six) hours. For abdominal pain       . DISCONTD: loperamide (IMODIUM A-D) 2 MG tablet Take 2 mg by mouth. As needed for diarrhea       . alosetron (LOTRONEX) 1 MG tablet Take 1  mg by mouth 2 (two) times daily.        Marland Kitchen DISCONTD: oxycodone (OXY-IR) 5 MG capsule Take 5 mg by mouth. 1 - 2 by mouth every 6 hours as needed for pain       . DISCONTD: traMADol (ULTRAM) 50 MG tablet Take 50 mg by mouth every 6 (six) hours as needed.            Review of Systems All otherwise neg per pt     Objective:   Physical Exam BP 104/80  Pulse 79  Temp(Src) 99 F (37.2 C) (Oral)  Ht 5\' 1"  (1.549 m)  Wt 213 lb (96.616 kg)  BMI 40.25 kg/m2  SpO2 97% Physical Exam  VS noted Constitutional: Pt appears well-developed and well-nourished.  HENT: Head: Normocephalic.  Right Ear: External ear normal.  Left Ear: External ear normal.  Eyes: Conjunctivae and EOM are normal. Pupils are equal, round, and reactive to light.  Neck: Normal range of motion. Neck supple.  Cardiovascular: Normal rate and regular rhythm.   Pulmonary/Chest: Effort normal and breath sounds normal.  Abd:  Soft, NT, non-distended, + BS Neurological: Pt is alert. No cranial nerve deficit.  Skin: Skin is warm. No erythema.  Psychiatric: Pt behavior is normal. Thought content normal.  Right thumb with mild diffuse swelling, some tender to the dorsal aspect, likely bone spur to the DIP with mild effusion and marked decreased ROM - unable to voluntarily flex well without pain and click Left knee with FROM, trace effusion, small crepitus, no click and NT       Assessment & Plan:

## 2010-07-15 NOTE — Patient Instructions (Addendum)
Take all new medications as prescribed - the provera We will try to call when the depoprovera shot is in Continue all other medications as before You will be contacted regarding the referral for: hand surgury You should cancel the appt with Neurosurgury - Dr Jordan Likes OK to take tylenol arthritis as needed for the thumb and left knee pain Please return in 6 mo

## 2010-07-15 NOTE — Telephone Encounter (Signed)
What we would do depends on how many days she has been having this Also need to know if she has been on antibiotics in last few months, if sick contacts, etc.

## 2010-07-16 ENCOUNTER — Encounter: Payer: Self-pay | Admitting: Internal Medicine

## 2010-07-16 NOTE — Telephone Encounter (Signed)
I spoke with the patient she is scheduled for an appt with Dr Leone Payor 07/22/10 3:15. She will come for stool studies today. See updated medication list she is not taking anything else for her diarrhea.

## 2010-07-16 NOTE — Telephone Encounter (Signed)
She may use up to 4 Imodium a day if needed and hold that and Lotronex if she gets constipated or has significant abdominal pain

## 2010-07-16 NOTE — Telephone Encounter (Signed)
I spoke with the patient again this am.  She has had no antibiotics, no sick contacts, and she did well for about 2 days on the Lotronex, Has had diarrhea now for the last week.

## 2010-07-16 NOTE — Assessment & Plan Note (Signed)
Much improved, for OTC tylenol prn, does not appear to need orthopedic eval at this time, f/u any worsening symptoms

## 2010-07-16 NOTE — Telephone Encounter (Signed)
May be a non-IBS issue Have her do stool studies for 787.91 diarrhea  Lactoferrin, culture and C. Diff PCR please  What else is she taking to help diarrhea (need a medlist update)  May need to squeeze her in next week with me

## 2010-07-16 NOTE — Telephone Encounter (Signed)
Patient is notified of Dr Marvell Fuller recommendations.  She will call back for any problems

## 2010-07-19 ENCOUNTER — Ambulatory Visit (INDEPENDENT_AMBULATORY_CARE_PROVIDER_SITE_OTHER): Payer: Medicare Other

## 2010-07-19 ENCOUNTER — Other Ambulatory Visit: Payer: Medicare Other

## 2010-07-19 ENCOUNTER — Other Ambulatory Visit: Payer: Self-pay | Admitting: Internal Medicine

## 2010-07-19 DIAGNOSIS — E538 Deficiency of other specified B group vitamins: Secondary | ICD-10-CM

## 2010-07-19 DIAGNOSIS — Z309 Encounter for contraceptive management, unspecified: Secondary | ICD-10-CM

## 2010-07-19 DIAGNOSIS — R197 Diarrhea, unspecified: Secondary | ICD-10-CM

## 2010-07-19 MED ORDER — MEDROXYPROGESTERONE ACETATE 150 MG/ML IM SUSP
150.0000 mg | Freq: Once | INTRAMUSCULAR | Status: AC
  Administered 2010-07-19: 150 mg via INTRAMUSCULAR

## 2010-07-19 MED ORDER — CYANOCOBALAMIN 1000 MCG/ML IJ SOLN
1000.0000 ug | Freq: Once | INTRAMUSCULAR | Status: AC
  Administered 2010-07-19: 1000 ug via INTRAMUSCULAR

## 2010-07-21 ENCOUNTER — Telehealth: Payer: Self-pay | Admitting: Internal Medicine

## 2010-07-21 NOTE — Telephone Encounter (Signed)
Called patient and she did schedule her next Depoprovera.

## 2010-07-21 NOTE — Telephone Encounter (Signed)
To robin    Please contact pt asap for her nurse visit for depoprovera shot

## 2010-07-22 ENCOUNTER — Encounter: Payer: Self-pay | Admitting: Internal Medicine

## 2010-07-22 ENCOUNTER — Ambulatory Visit: Payer: Medicare Other

## 2010-07-22 ENCOUNTER — Ambulatory Visit (INDEPENDENT_AMBULATORY_CARE_PROVIDER_SITE_OTHER): Payer: Medicare Other | Admitting: Internal Medicine

## 2010-07-22 DIAGNOSIS — R197 Diarrhea, unspecified: Secondary | ICD-10-CM

## 2010-07-22 DIAGNOSIS — K589 Irritable bowel syndrome without diarrhea: Secondary | ICD-10-CM

## 2010-07-22 MED ORDER — LOPERAMIDE HCL 2 MG PO CAPS: 2.0000 mg | ORAL_CAPSULE | Freq: Four times a day (QID) | ORAL | Status: AC | PRN

## 2010-07-22 MED ORDER — RIFAXIMIN 550 MG PO TABS: 550.0000 mg | ORAL_TABLET | Freq: Two times a day (BID) | ORAL | Status: AC

## 2010-07-22 MED ORDER — ALOSETRON HCL 1 MG PO TABS: 1.0000 mg | ORAL_TABLET | Freq: Two times a day (BID) | ORAL | Status: DC

## 2010-07-22 NOTE — Progress Notes (Signed)
History of present illness:  35 year old white woman I follow for IBS. Earlier this year she began Lotronex 0.5 mg twice a day with good results. Lately she's been experiencing increased gas bloating and diarrhea. She had called the office in the past couple of weeks and I increased the Lotronex to 1 mg twice a day. For several days she felt better. She called back indicating that the diarrhea had returned. She is describing loose to watery stools, often postprandial. They're not nocturnal. She denies any recent antibiotics, she has not had any infectious contacts or travel. So far today she's had 3 loose stools that are watery. The bowel movements are often associated with abdominal cramps. She is not losing weight unintentionally. There's been no fever, with a maximum temperature of 99. Since she felt no different she stopped the Lotronex and is currently on hold.  Review of systems her fibromyalgia is stable to improved. She's had some recent gum pain and saw her primary care physician. She denies any new or significant stressors  Physical exam: Obese no acute distress  face bilateral rubor of the cheeks Lungs clear Heart S1-S2 no gallops or murmur heard today Abdomen is obese soft some mild tenderness at best to deep palpation no organomegaly or mass bowel sounds present Psych shows appropriate mood and affect

## 2010-07-22 NOTE — Patient Instructions (Addendum)
Continue to hold the Lotronex. Xifaxan has been prescribed, please take it 3 times a day for 14 days even though that was written for twice a day. Once you have finished with the antibiotics call Dr. Leone Payor with an update. You may use Imodium as needed up to 6 a day to help with the diarrhea.

## 2010-07-23 ENCOUNTER — Encounter: Payer: Self-pay | Admitting: Internal Medicine

## 2010-07-23 NOTE — Assessment & Plan Note (Signed)
Still think this is IBS overall. As we know that the diagnoses of exclusion and we discussed that. Her C. difficile PCR is negative. I don't know why she responded and then failed response to Lotronex. Presumably she was compliant though not directly questioned may need to revisit that issue.  We'll try a course of Xifaxan 550 mg 3 times a day for 14 days. That will cover possible small bowel bacterial overgrowth. Further plans pending that. She could need a capsule endoscopy of the small bowel. That would look for microscopic disease. In the past sedimentation rate etc. has all been negative. Another option would be something like bile salt binders all though that could get somewhat tricky with medication binding but might be needed.

## 2010-07-23 NOTE — Assessment & Plan Note (Signed)
Still think this is IBS. A repeat C. differential PCR was negative this week. Lack of hair in and cultures somehow were not collected. As I speak to her it does not sound infectious.  Plan will be to treat with Xifaxan 550 mg 3 times a day for 14 days. She will call for followup.

## 2010-08-18 ENCOUNTER — Ambulatory Visit (INDEPENDENT_AMBULATORY_CARE_PROVIDER_SITE_OTHER): Payer: Medicare Other

## 2010-08-18 DIAGNOSIS — E538 Deficiency of other specified B group vitamins: Secondary | ICD-10-CM

## 2010-08-18 MED ORDER — CYANOCOBALAMIN 1000 MCG/ML IJ SOLN
1000.0000 ug | Freq: Once | INTRAMUSCULAR | Status: AC
  Administered 2010-08-18: 1000 ug via INTRAMUSCULAR

## 2010-08-30 ENCOUNTER — Other Ambulatory Visit: Payer: Self-pay | Admitting: Internal Medicine

## 2010-08-31 NOTE — Procedures (Signed)
Courtney [name], Courtney [name]                ACCOUNT NO.:  [id number]   MEDICAL RECORD NO.:  [id number]          PATIENT TYPE:  OUT   LOCATION:  SLEEP CENTER                 FACILITY:  [facility]   PHYSICIAN:  Barbaraann Share, MD,FCCPDATE OF BIRTH:  Sep 01, 1975   DATE OF STUDY:  11/29/2008                            NOCTURNAL POLYSOMNOGRAM   REFERRING PHYSICIAN:  Barbaraann Share, MD,FCCP   INDICATION FOR STUDY:  Hypersomnia with sleep apnea.   EPWORTH SLEEPINESS SCORE:  12.   MEDICATIONS:   SLEEP ARCHITECTURE:  The patient had a total sleep time of 359 minutes  with adequate slow wave sleep for age, but significantly decreased REM.  Sleep onset latency was prolonged at 59 minutes, and REM onset was very  prolonged at 372 minutes.  Sleep efficiency was moderately decreased at  76%.   RESPIRATORY DATA:  The patient was found to have no obstructive apneas  and 22 obstructive hypopneas, giving her an apnea-hypopnea index of 4  events per hour.  She was also noted to have 38 respiratory effort  related arousals, giving her a RDI of 10 events per hour.  There was  moderate snoring noted throughout and the events were not positional.   OXYGEN DATA:  There was O2 desaturation transiently as low as 90% with  her obstructive events.   CARDIAC DATA:  No clinically significant arrhythmias were seen.   MOVEMENT-PARASOMNIA:  There were no significant leg jerks or abnormal  behavior seen.   IMPRESSIONS-RECOMMENDATIONS:  1. Mild obstructive sleep apnea/hypopnea syndrome with an apnea-      hypopnea index of 4 events per hour, and a respiratory disturbance      index of 10 events per hour.  There was O2 desaturation transiently      as low as 90%.  Treatment for this degree of sleep apnea can      include a trial of weight loss alone, upper airway surgery, dental      appliance, and also CPAP.  Clinical correlation is suggested.  2. The patient was also found to have 219 nonspecific arousals during  the night.  It was unclear from the study as to the etiology for      these.      Barbaraann Share, MD,FCCP  Diplomate, American Board of Sleep  Medicine  Electronically Signed     [facility]/MEDQ  D:  11/29/2008 16:18:33  T:  11/29/2008 23:18:57  Job:  [id number]

## 2010-09-03 NOTE — Consult Note (Signed)
[facility]  Patient:    Courtney [name], Courtney [name] Visit Number: [id number] MRN: [id number]          Service Type: OBS Location: [facility] Attending Physician:  Lenoard Aden Dictated by:   Lenoard Aden, M.D. Consultation Date: 02/21/01 Admit Date:  02/21/2001   CC:         [facility]   Consultation Report  CHIEF COMPLAINT:  Preterm cervical change.  HISTORY OF PRESENT ILLNESS:  A 35 year old white female G3, P-0-1-1-1, EDD, April 20, 2001, at 32 plus weeks who presents with preterm cervical change.  ALLERGIES:  Allergies to PENICILLIN.  MEDICATIONS:  Prenatal vitamins.  PAST MEDICAL HISTORY: 1. History of spontaneous vaginal delivery in 2000 of a 4 pound 8 ounce female    at 34 weeks, and a spontaneous abortion in March 2001, without dilatation    and curettage. 2. No other medical or surgical hospitalizations. 3. She has mitral valve prolapse for which she takes prophylaxis.  FAMILY HISTORY:  Cardiovascular disease, adult onset diabetes, and lung cancer.  PRENATAL DATA:  Remarkable for a blood type A plus, Rh antibody negative. Rubella immune.  Hepatitis B surface antigen negative.  HIV nonreactive.  GC and chlamydia negative.  PHYSICAL EXAMINATION:  GENERAL:  Well-developed well-nourished white female in no acute distress.  HEENT:  Normal.  LUNGS:  Clear.  HEART:  Regular rhythm.  ABDOMEN:  Soft, gravid, nontender.  CERVIX:  1+, 1 cm dilated, 2-1/2 cm long, firm, vertex and ballotable.  VITAL SIGNS:  Stable.  The patient is afebrile.  EXTREMITIES:  Reveal no cords.  NEUROLOGIC:  Nonfocal.  IMPRESSION: 1. A 32 week intrauterine pregnancy. 2. Preterm cervical change with contractions noted responsive to subcutaneous    terbutaline.  PLAN: 1. Continuous monitoring. 2. The patient now has had 1 contraction in 30 minutes this with subcutaneous    terbutaline without cervical change. 3. If no contractions in the next 30  minutes will discontinue home.  Repeat    betamethasone in 24 hours. 4. Will monitor at that time for any recurrent contraction pattern and patient    is to be home on modified bed rest.  FOLLOWUP:  Follow up in the office within 1 week. Dictated by:   Lenoard Aden, M.D. Attending Physician:  Lenoard Aden DD:  02/21/01 TD:  02/21/01 Job: [id number]

## 2010-09-03 NOTE — Op Note (Signed)
Courtney [name], Courtney [name]                ACCOUNT NO.:  [id number]   MEDICAL RECORD NO.:  [id number]          PATIENT TYPE:  OUT   LOCATION:  [facility]                          FACILITY:  [facility]   PHYSICIAN:  Michael L. Reynolds, M.D.DATE OF BIRTH:  1975/10/25   DATE OF PROCEDURE:  02/02/2005  DATE OF DISCHARGE:                                 OPERATIVE REPORT   PROCEDURE PERFORMED:  Diagnostic lumbar puncture.   OPERATOR:  Michael L. Thad Ranger, M.D.   INDICATIONS FOR PROCEDURE:  Paresthesias, abnormal MRI, possible multiple  sclerosis.   DESCRIPTION OF PROCEDURE:  Informed consent was obtained and form was signed  in the chart after the procedure risks and benefits were explained to the  patient and she agreed to proceed.   The patient was placed in right lateral decubitus position and prepped and  draped in the usual sterile fashion.  Initially, the L3-4 level was prepped  and anesthetized with 6 mL of lidocaine.  Several attempts were made to pass  the 20 gauge spinal needle through the L3-4 interspace into the subarachnoid  space but no CSF was obtained.  Subsequently, the L2-3 level was  anesthetized with 5 mL of lidocaine and after a few attempts to pass the  needle, CSF was obtained which was initially blood tinged but cleared.  Opening pressure was 130 mmH2O.  Approximately 8 mL of CSF was withdrawn and  sent for a laboratory analysis as follows:  Tube #1 2 mL cell count and  differential.  Tube #2 2 mL glucose, protein and oligoclonal banding.  Tube  #3 2 mL cell count and differential.  Tube #4 2 mL to be held in the lab for  additional testing as needed.  Closing pressure was measured at 115 mmH2O.  Needle was withdrawn, hemostasis was obtained. The patient was advised to  remain flat following the procedure for at least an hour and then may be  discharged home when she is feeling well.  No immediate complications were  noted.  One red top of blood was ordered to be drawn and sent to  the  laboratory with the CSF for oligoclonal banding analysis.  The patient was  advised to come to the emergency room immediately if she developed fever,  neck stiffness, mental status changes, seizures, weakness of the legs of  incontinence of bladder or bowel and was advised to call the office or the  on call physician over the next few days if she developed postural headache.      Michael L. Thad Ranger, M.D.  Electronically Signed     MLR/MEDQ  D:  02/02/2005  T:  02/02/2005  Job:  [id number]

## 2010-09-03 NOTE — H&P (Signed)
[facility]  Patient:    Courtney [name], Courtney [name] Visit Number: [id number] MRN: [id number]          Service Type: OBS Location: [facility] Attending Physician:  Lenoard Aden Dictated by:   Lenoard Aden, M.D. Admit Date:  02/26/2001   CC:         [facility]   History and Physical  CHIEF COMPLAINT:              Contractions.  HISTORY OF PRESENT ILLNESS:   Patient is a 35 year old white female, G3, P1, EDD April 20, 2001, at 32-5/7ths weeks.  She reports contractions every two to five minutes.  ALLERGIES:                    PENICILLIN.  MEDICATIONS:                  Prenatal vitamins.  OBSTETRIC HISTORY:            Remarkable for spontaneous vaginal delivery of a 4 pound, 8 ounce female in April 2000, history of spontaneous pregnancy loss in March 2001.  PAST MEDICAL HISTORY:         Otherwise remarkable for mitral valve prolapse for which she takes antibiotic chemoprophylaxis.  FAMILY HISTORY:               Lung cancer, adult-onset diabetes, and heart disease.  PRENATAL LAB DATA:            Blood type A positive, Rh antibody negative, rubella immune, hepatitis B surface negative, HIV nonreactive, GC and chlamydia negative.  PHYSICAL EXAMINATION:  GENERAL:                      She is a well-developed, well-nourished white female in no apparent distress.  HEENT:                        Normal.  LUNGS:                        Clear.  HEART:                        Regular rhythm.  ABDOMEN:                      Soft, gravid, nontender.  Cervix is 1 cm, 50% vertex, -2.  EXTREMITIES:                  No cords.  NEUROLOGIC:                   Exam is nonfocal.  LABORATORY DATA:              Urinalysis is negative.  NST is reactive. Initially contractions every two to five minutes.  Patient refused terbutaline and refused Procardia dosing.  After observing contractions for more than an hours time, contractions are spacing out every 6 to  10 minutes.  IMPRESSION:                   Preterm contractions in a patient with history of preterm delivery at 34 weeks.  No evidence of progressive cervical change. Her cervical exam is stable x 1 week.  PLAN:  Discharge home with Procardia 10 mg q.4-6h., #20 given.  Preterm labor warning is given.  Follow up in the office in 48 to 72 hours.  GBS performed. Dictated by:   Lenoard Aden, M.D. Attending Physician:  Lenoard Aden DD:  02/26/01 TD:  02/26/01 Job: [id number]

## 2010-09-20 ENCOUNTER — Ambulatory Visit (INDEPENDENT_AMBULATORY_CARE_PROVIDER_SITE_OTHER): Payer: Medicare Other

## 2010-09-20 DIAGNOSIS — E538 Deficiency of other specified B group vitamins: Secondary | ICD-10-CM

## 2010-09-20 MED ORDER — CYANOCOBALAMIN 1000 MCG/ML IJ SOLN
1000.0000 ug | Freq: Once | INTRAMUSCULAR | Status: AC
  Administered 2010-09-20: 1000 ug via INTRAMUSCULAR

## 2010-10-15 ENCOUNTER — Encounter: Payer: Self-pay | Admitting: Internal Medicine

## 2010-10-15 ENCOUNTER — Ambulatory Visit (INDEPENDENT_AMBULATORY_CARE_PROVIDER_SITE_OTHER): Payer: Medicare Other | Admitting: Internal Medicine

## 2010-10-15 VITALS — BP 110/80 | HR 80 | Temp 98.8°F | Ht 61.0 in | Wt 214.2 lb

## 2010-10-15 DIAGNOSIS — R220 Localized swelling, mass and lump, head: Secondary | ICD-10-CM

## 2010-10-15 DIAGNOSIS — Z0000 Encounter for general adult medical examination without abnormal findings: Secondary | ICD-10-CM

## 2010-10-15 DIAGNOSIS — Z309 Encounter for contraceptive management, unspecified: Secondary | ICD-10-CM

## 2010-10-15 DIAGNOSIS — F411 Generalized anxiety disorder: Secondary | ICD-10-CM

## 2010-10-15 DIAGNOSIS — I10 Essential (primary) hypertension: Secondary | ICD-10-CM

## 2010-10-15 DIAGNOSIS — E538 Deficiency of other specified B group vitamins: Secondary | ICD-10-CM

## 2010-10-15 MED ORDER — PREDNISONE 10 MG PO TABS: 20.0000 mg | ORAL_TABLET | Freq: Every day | ORAL | Status: AC

## 2010-10-15 MED ORDER — CYANOCOBALAMIN 1000 MCG/ML IJ SOLN
1000.0000 ug | Freq: Once | INTRAMUSCULAR | Status: AC
  Administered 2010-10-15: 1000 ug via INTRAMUSCULAR

## 2010-10-15 MED ORDER — MEDROXYPROGESTERONE ACETATE 150 MG/ML IM SUSP
150.0000 mg | Freq: Once | INTRAMUSCULAR | Status: AC
  Administered 2010-10-15: 150 mg via INTRAMUSCULAR

## 2010-10-15 MED ORDER — DOXYCYCLINE HYCLATE 100 MG PO TABS: 100.0000 mg | ORAL_TABLET | Freq: Two times a day (BID) | ORAL | Status: AC

## 2010-10-15 NOTE — Patient Instructions (Addendum)
You had the B12 and depoprovera shots today Take all new medications as prescribed Continue all other medications as before Please return in 3 months, or sooner if needed

## 2010-10-15 NOTE — Assessment & Plan Note (Signed)
bilat max sinus area with tender L>R - ? Angioedema (no obviuos cause)vs cellulitis;  For doxy course, and low dose predpack;  to f/u any worsening symptoms or concerns, Continue all other medications as before

## 2010-10-17 ENCOUNTER — Encounter: Payer: Self-pay | Admitting: Internal Medicine

## 2010-10-17 DIAGNOSIS — Z309 Encounter for contraceptive management, unspecified: Secondary | ICD-10-CM | POA: Insufficient documentation

## 2010-10-18 ENCOUNTER — Ambulatory Visit: Payer: Medicare Other

## 2010-10-20 ENCOUNTER — Encounter: Payer: Self-pay | Admitting: Internal Medicine

## 2010-10-20 DIAGNOSIS — Z0000 Encounter for general adult medical examination without abnormal findings: Secondary | ICD-10-CM | POA: Insufficient documentation

## 2010-10-20 DIAGNOSIS — Z0001 Encounter for general adult medical examination with abnormal findings: Secondary | ICD-10-CM | POA: Insufficient documentation

## 2010-10-20 NOTE — Assessment & Plan Note (Signed)
For routine vit b12 IM today

## 2010-10-20 NOTE — Assessment & Plan Note (Signed)
For routine depoprovera today

## 2010-10-20 NOTE — Assessment & Plan Note (Signed)
stable overall by hx and exam, most recent data reviewed with pt, and pt to continue medical treatment as before  Lab Results  Component Value Date   WBC 6.9 01/12/2010   HGB 13.0 01/12/2010   HCT 38.2 01/12/2010   PLT 327.0 01/12/2010   CHOL 153 01/12/2010   TRIG 264.0* 01/12/2010   HDL 40.60 01/12/2010   LDLDIRECT 80.7 01/12/2010   ALT 23 01/12/2010   AST 25 01/12/2010   NA 140 01/12/2010   K 4.1 01/12/2010   CL 103 01/12/2010   CREATININE 0.8 01/12/2010   BUN 10 01/12/2010   CO2 29 01/12/2010   TSH 2.32 01/12/2010

## 2010-10-20 NOTE — Progress Notes (Signed)
Subjective:    Patient ID: Courtney [name], female    DOB: June 20, 1975, 35 y.o.   MRN: [id number]  HPI   Here with 3 days acute onset facial swelling and pressure, general weakness and malaise, and colored nasal d/c, with slight ST, but little to no cough and Pt denies chest pain, increased sob or doe, wheezing, orthopnea, PND, increased LE swelling, palpitations, dizziness or syncope. No f/c, and no worsening sinus allergy symtpoms recent, though some itching noted in the area of swelling max sinus areas.  Worse despite zyrtec.  Last yardwork 4 days ago.  Pt denies new neurological symptoms such as new headache, or facial or extremity weakness or numbness   Pt denies polydipsia, polyuria. Denies worsening depressive symptoms, suicidal ideation, or panic, though has ongoing anxiety, not increased recently.   Past Medical History  Diagnosis Date  . GERD (gastroesophageal reflux disease)   . Fibromyalgia   . Chronic fatigue   . CTS (carpal tunnel syndrome)   . Anemia, B12 deficiency   . Multiple sclerosis   . Hyperlipidemia   . C. difficile colitis   . Anxiety   . Heart murmur   . VITAMIN D DEFICIENCY 01/13/2010  . OBSTRUCTIVE SLEEP APNEA 12/08/2008  . Irritable bowel syndrome 05/24/2010  . HYPERLIPIDEMIA 03/13/2007  . ESSENTIAL HYPERTENSION 03/13/2007  . CARPAL TUNNEL SYNDROME, BILATERAL 01/13/2010  . ALLERGIC RHINITIS 03/13/2007   Past Surgical History  Procedure Date  . Carpal tunnel release 01/2010    reports that she has never smoked. She has never used smokeless tobacco. She reports that she does not drink alcohol or use illicit drugs. family history includes Diabetes in her other; Heart attack in her maternal grandfather; Hypertension in her other; Lung cancer in her maternal grandmother; Migraines in her mother; Prostate cancer in her maternal uncle; and Stroke in her maternal grandfather.  There is no history of Colon cancer. Allergies  Allergen Reactions  . Penicillins    REACTION: rash   Current Outpatient Prescriptions on File Prior to Visit  Medication Sig Dispense Refill  . cetirizine (ZYRTEC) 10 MG tablet Take 10 mg by mouth daily.        . Cyanocobalamin (YL VITAMIN B-12 CR) 1000 MCG TBCR Take by mouth. 1 injection every month       . LASIX 40 MG tablet TAKE ONE TABLET DAILY.  90 each  2  . LOPRESSOR 100 MG tablet TAKE 1 TABLET BY MOUTH   TWICE DAILY.  60 each  10  . medroxyPROGESTERone (DEPO-PROVERA) 150 MG/ML injection Inject 150 mg into the muscle every 3 (three) months.        . nortriptyline (PAMELOR) 50 MG capsule Take 50 mg by mouth 2 (two) times daily. At bedtime       . omeprazole-sodium bicarbonate (ZEGERID) 40-1100 MG per capsule Take 1 capsule by mouth daily.        . potassium chloride SA (KLOR-CON M10) 10 MEQ tablet Take 10 mEq by mouth daily.        . pregabalin (LYRICA) 75 MG capsule Take 75 mg by mouth 2 (two) times daily.        . simvastatin (ZOCOR) 40 MG tablet Take 40 mg by mouth. daily        Review of Systems Review of Systems  Constitutional: Negative for diaphoresis and unexpected weight change.  HENT: Negative for drooling and tinnitus.   Eyes: Negative for photophobia and visual disturbance.  Respiratory: Negative for choking and stridor.  Gastrointestinal: Negative for vomiting and blood in stool.  Genitourinary: Negative for hematuria and decreased urine volume.     Objective:   Physical Exam BP 110/80  Pulse 80  Temp(Src) 98.8 F (37.1 C) (Oral)  Ht 5\' 1"  (1.549 m)  Wt 214 lb 4 oz (97.183 kg)  BMI 40.48 kg/m2  SpO2 97% Physical Exam  VS noted, mild ill appaering Constitutional: Pt appears well-developed and well-nourished.  HENT: Head: Normocephalic.  Right Ear: External ear normal.  Left Ear: External ear normal.  Bilat tm's mild erythema.  Sinus ? Mild tender bilat max areas, but certainly with 2+ overlying swelling.  Pharynx mild erythema Eyes: Conjunctivae and EOM are normal. Pupils are equal, round, and  reactive to light.  Neck: Normal range of motion. Neck supple.  Cardiovascular: Normal rate and regular rhythm.   Pulmonary/Chest: Effort normal and breath sounds normal.  Neurological: Pt is alert. No cranial nerve deficit.  Skin: Skin is warm. No erythema. except for the above Psychiatric: Pt behavior is normal. Thought content normal. 1+nervous        Assessment & Plan:

## 2010-10-20 NOTE — Assessment & Plan Note (Signed)
stable overall by hx and exam, most recent data reviewed with pt, and pt to continue medical treatment as before  BP Readings from Last 3 Encounters:  10/15/10 110/80  07/22/10 132/80  07/15/10 104/80

## 2010-11-15 ENCOUNTER — Ambulatory Visit (INDEPENDENT_AMBULATORY_CARE_PROVIDER_SITE_OTHER): Payer: Medicare Other

## 2010-11-15 DIAGNOSIS — E538 Deficiency of other specified B group vitamins: Secondary | ICD-10-CM

## 2010-11-15 MED ORDER — CYANOCOBALAMIN 1000 MCG/ML IJ SOLN
1000.0000 ug | Freq: Once | INTRAMUSCULAR | Status: AC
  Administered 2010-11-15: 1000 ug via INTRAMUSCULAR

## 2010-12-06 ENCOUNTER — Telehealth: Payer: Self-pay | Admitting: Internal Medicine

## 2010-12-06 NOTE — Telephone Encounter (Signed)
She is taking 3-4 imodium daily .  I have given her an appt for next week.  She is not interested in taking lotronex again.  What is the max number of imodium she can take in a day until her appt next week?

## 2010-12-07 ENCOUNTER — Other Ambulatory Visit: Payer: Self-pay | Admitting: Internal Medicine

## 2010-12-07 NOTE — Telephone Encounter (Signed)
Patient advised.

## 2010-12-07 NOTE — Telephone Encounter (Signed)
6/day Immodium

## 2010-12-14 ENCOUNTER — Ambulatory Visit: Payer: Medicare Other | Admitting: Internal Medicine

## 2010-12-15 ENCOUNTER — Ambulatory Visit (INDEPENDENT_AMBULATORY_CARE_PROVIDER_SITE_OTHER): Payer: Medicare Other | Admitting: *Deleted

## 2010-12-15 DIAGNOSIS — E538 Deficiency of other specified B group vitamins: Secondary | ICD-10-CM

## 2010-12-15 MED ORDER — CYANOCOBALAMIN 1000 MCG/ML IJ SOLN
1000.0000 ug | Freq: Once | INTRAMUSCULAR | Status: AC
  Administered 2010-12-15: 1000 ug via INTRAMUSCULAR

## 2010-12-23 ENCOUNTER — Other Ambulatory Visit (INDEPENDENT_AMBULATORY_CARE_PROVIDER_SITE_OTHER): Payer: Medicare Other

## 2010-12-23 ENCOUNTER — Ambulatory Visit (INDEPENDENT_AMBULATORY_CARE_PROVIDER_SITE_OTHER)
Admission: RE | Admit: 2010-12-23 | Discharge: 2010-12-23 | Disposition: A | Discharge status: Home / Self Care | Payer: Medicare Other | Source: Ambulatory Visit | Attending: Cardiology | Admitting: Cardiology

## 2010-12-23 ENCOUNTER — Encounter (INDEPENDENT_AMBULATORY_CARE_PROVIDER_SITE_OTHER): Payer: Medicare Other | Admitting: *Deleted

## 2010-12-23 ENCOUNTER — Other Ambulatory Visit: Payer: Self-pay | Admitting: Internal Medicine

## 2010-12-23 ENCOUNTER — Telehealth: Payer: Self-pay

## 2010-12-23 ENCOUNTER — Encounter: Payer: Self-pay | Admitting: Internal Medicine

## 2010-12-23 ENCOUNTER — Ambulatory Visit (INDEPENDENT_AMBULATORY_CARE_PROVIDER_SITE_OTHER): Payer: Medicare Other | Admitting: Internal Medicine

## 2010-12-23 VITALS — BP 130/70 | HR 94 | Temp 98.9°F | Ht 61.0 in | Wt 214.5 lb

## 2010-12-23 DIAGNOSIS — R0609 Other forms of dyspnea: Secondary | ICD-10-CM

## 2010-12-23 DIAGNOSIS — R0600 Dyspnea, unspecified: Secondary | ICD-10-CM | POA: Insufficient documentation

## 2010-12-23 DIAGNOSIS — M7989 Other specified soft tissue disorders: Secondary | ICD-10-CM

## 2010-12-23 DIAGNOSIS — M79609 Pain in unspecified limb: Secondary | ICD-10-CM

## 2010-12-23 DIAGNOSIS — M79602 Pain in left arm: Secondary | ICD-10-CM | POA: Insufficient documentation

## 2010-12-23 DIAGNOSIS — R079 Chest pain, unspecified: Secondary | ICD-10-CM | POA: Insufficient documentation

## 2010-12-23 DIAGNOSIS — M25519 Pain in unspecified shoulder: Secondary | ICD-10-CM | POA: Insufficient documentation

## 2010-12-23 LAB — CBC WITH DIFFERENTIAL/PLATELET
Basophils Absolute: 0 10*3/uL (ref 0.0–0.1)
Hemoglobin: 13 g/dL (ref 12.0–15.0)
Lymphocytes Relative: 31 % (ref 12.0–46.0)
Monocytes Relative: 5.9 % (ref 3.0–12.0)
Platelets: 314 10*3/uL (ref 150.0–400.0)
RDW: 14.9 % — ABNORMAL HIGH (ref 11.5–14.6)

## 2010-12-23 LAB — PROTIME-INR: Prothrombin Time: 11 s (ref 10.2–12.4)

## 2010-12-23 LAB — BASIC METABOLIC PANEL
CO2: 26 mEq/L (ref 19–32)
Chloride: 105 mEq/L (ref 96–112)
Potassium: 4 mEq/L (ref 3.5–5.1)
Sodium: 140 mEq/L (ref 135–145)

## 2010-12-23 MED ORDER — IOHEXOL 350 MG/ML SOLN
80.0000 mL | Freq: Once | INTRAVENOUS | Status: AC | PRN
  Administered 2010-12-23: 80 mL via INTRAVENOUS

## 2010-12-23 MED ORDER — PREDNISONE 10 MG PO TABS: ORAL_TABLET | ORAL | Status: DC

## 2010-12-23 NOTE — Progress Notes (Signed)
Addended by: Corwin Levins on: 12/23/2010 05:04 PM   Modules accepted: Orders

## 2010-12-23 NOTE — Telephone Encounter (Signed)
I also received notice that her LUE ultrasound was neg for DVT  That means most likely the problem is orthopedic concerning the elbow or the shoulder, and could even be gout  OK for prednisone course (I will do per emr), as well as refer to orthopedic  Robin to inform pt

## 2010-12-23 NOTE — Patient Instructions (Addendum)
Continue all other medications as before, except be aware if you do have blood clot, you will need to stop the depoprovera You will be contacted regarding the referral for: CT chest, and left arm ultrasound (see PCC's now) Depending on the results, you may need to be referred for inpatient management Please go to LAB in the Basement for the blood and/or urine tests to be done today - stat Please call the phone number [phone] (the PhoneTree System) for results of testing in 2-3 days;  When calling, simply dial the number, and when prompted enter the MRN number above (the Medical Record Number) and the # key, then the message should start. If there is blood clot, and only in the left arm, you will be prescribed shots and pills for blood thinner, to be explained further depending on the lab results

## 2010-12-23 NOTE — Assessment & Plan Note (Signed)
Left sided, ? inclduing the elbow as well - ct chest and LUE u/s neg for dvt/pe;  Will tx with predpack and refer ortho

## 2010-12-23 NOTE — Telephone Encounter (Signed)
Courtney [name] called from CT, the patient has no PE.

## 2010-12-23 NOTE — Assessment & Plan Note (Addendum)
In light of the left arm situation, cant r/o dvt; will forgo d-dimer, check bmet and schedule ct angio now, if abnormal, may need to be referred for inpt management

## 2010-12-23 NOTE — Assessment & Plan Note (Addendum)
Most likely due to above, also for cbc today, very unlikely to be cardiac related, for expediency will not pursue ecg at this time, for ct chest  As above

## 2010-12-23 NOTE — Progress Notes (Signed)
Subjective:    Patient ID: Courtney [name], female    DOB: 08-Oct-1975, 35 y.o.   MRN: [id number]  HPI  Very nice young lady, chronically disabled with MS, on depoprovera for BC purposes, no prior hx of DVT or similar, here with 1 wk vague discomfort of the LUE above the elbow without redness or swelling that she though was muscle soreness and seemed to cause some soreness with shoulder elevation laterally, then yesterday with marked increased diffuse LUE swelling and more discomfort (now moderate overall, declines pain med), with cont'd soreness , worse to move the arm and shoulder, as well as mild but new sense of mild sob/upper chest pressure intermittent as well;  No n/v, diaphoresis, palp's, dizziness or syncope.   Pt denies fever, wt loss, night sweats, loss of appetite, or other constitutional symptoms  No recent trauma, fall or overuse of the extremity Past Medical History  Diagnosis Date  . GERD (gastroesophageal reflux disease)   . Fibromyalgia   . Chronic fatigue   . CTS (carpal tunnel syndrome)   . Anemia, B12 deficiency   . Multiple sclerosis   . Hyperlipidemia   . C. difficile colitis   . Anxiety   . Heart murmur   . VITAMIN D DEFICIENCY 01/13/2010  . OBSTRUCTIVE SLEEP APNEA 12/08/2008  . Irritable bowel syndrome 05/24/2010  . HYPERLIPIDEMIA 03/13/2007  . ESSENTIAL HYPERTENSION 03/13/2007  . CARPAL TUNNEL SYNDROME, BILATERAL 01/13/2010  . ALLERGIC RHINITIS 03/13/2007   Past Surgical History  Procedure Date  . Carpal tunnel release 01/2010  . Colonoscopy w/ biopsies and polypectomy 05/2009    2 tubular adenomas    reports that she has never smoked. She has never used smokeless tobacco. She reports that she does not drink alcohol or use illicit drugs. family history includes Diabetes in her other; Heart attack in her maternal grandfather; Hypertension in her other; Lung cancer in her maternal grandmother; Migraines in her mother; Prostate cancer in her maternal uncle; and Stroke  in her maternal grandfather.  There is no history of Colon cancer. Allergies  Allergen Reactions  . Penicillins     REACTION: rash   Current Outpatient Prescriptions on File Prior to Visit  Medication Sig Dispense Refill  . cetirizine (ZYRTEC) 10 MG tablet Take 10 mg by mouth daily.        . Cyanocobalamin (YL VITAMIN B-12 CR) 1000 MCG TBCR Take by mouth. 1 injection every month       . LASIX 40 MG tablet TAKE ONE TABLET DAILY.  90 each  2  . LOPRESSOR 100 MG tablet TAKE 1 TABLET BY MOUTH   TWICE DAILY.  60 each  10  . medroxyPROGESTERone (DEPO-PROVERA) 150 MG/ML injection Inject 150 mg into the muscle every 3 (three) months.        . nortriptyline (PAMELOR) 50 MG capsule Take 50 mg by mouth 2 (two) times daily. At bedtime       . omeprazole-sodium bicarbonate (ZEGERID) 40-1100 MG per capsule Take 1 capsule by mouth daily.        . potassium chloride (K-DUR,KLOR-CON) 10 MEQ tablet TAKE ONE TABLET DAILY.  90 tablet  3  . pregabalin (LYRICA) 75 MG capsule Take 75 mg by mouth 2 (two) times daily.        . simvastatin (ZOCOR) 40 MG tablet Take 40 mg by mouth. daily        Review of Systems Review of Systems  Constitutional: Negative for diaphoresis and unexpected weight change.  HENT: Negative for drooling and tinnitus.   Eyes: Negative for photophobia and visual disturbance.  Respiratory: Negative for choking and stridor.   Gastrointestinal: Negative for vomiting and blood in stool.  Genitourinary: Negative for hematuria and decreased urine volume.  Musculoskeletal: Negative for gait problem.  Skin: Negative for color change and wound.  Neurological: Negative for tremors and numbness.  Psychiatric/Behavioral: Negative for decreased concentration. The patient is not hyperactive.       Objective:   Physical Exam BP 130/70  Pulse 94  Temp(Src) 98.9 F (37.2 C) (Oral)  Ht 5\' 1"  (1.549 m)  Wt 214 lb 8 oz (97.297 kg)  BMI 40.53 kg/m2  SpO2 98% Physical Exam  VS  noted Constitutional: Pt is oriented to person, place, and time. Appears well-developed and well-nourished.  HENT:  Head: Normocephalic and atraumatic.  Right Ear: External ear normal.  Left Ear: External ear normal.  Nose: Nose normal.  Mouth/Throat: Oropharynx is clear and moist.  Eyes: Conjunctivae and EOM are normal. Pupils are equal, round, and reactive to light.  Neck: Normal range of motion. Neck supple. No JVD present. No tracheal deviation present.  Cardiovascular: Normal rate, regular rhythm, normal heart sounds and intact distal pulses.   Pulmonary/Chest: Effort normal and breath sounds normal.  Abdominal: Soft. Bowel sounds are normal. There is no tenderness.  Musculoskeletal: Normal range of motion.except for decresae ROM left shoudler abduction to 90 degrees , assoc with 2-3+ diffuse LUE swelling from shoulder to hand with tender to palpate left shoudler and bicep/tricep area;  No erythema noted to LUE or rash Lymphadenopathy:  Has no cervical adenopathy.  Neurological: Pt is alert and oriented to person, place, and time. Pt has normal reflexes. No cranial nerve deficit.  Skin: Skin is warm and dry. No rash noted.  Psychiatric:  Has  normal mood and affect. Behavior is normal.         Assessment & Plan:

## 2010-12-23 NOTE — Assessment & Plan Note (Signed)
Overall midl to mod, likely related to the swelling, declines pain med at this time, doubt neurogenic cause such as cervical radiculopathy

## 2010-12-23 NOTE — Assessment & Plan Note (Signed)
With pain, likely due to DVT - for LUE venous doppler now, tx depending on results, doubt infection, overuse, trauma or other etiology though cant completely r/o left shoudler pathology but this would be very unusual

## 2010-12-24 NOTE — Telephone Encounter (Signed)
Called patient - informed of results 

## 2010-12-28 ENCOUNTER — Other Ambulatory Visit: Payer: Self-pay

## 2010-12-28 MED ORDER — SIMVASTATIN 40 MG PO TABS: 40.0000 mg | ORAL_TABLET | Freq: Every day | ORAL | Status: DC

## 2011-01-14 ENCOUNTER — Other Ambulatory Visit (INDEPENDENT_AMBULATORY_CARE_PROVIDER_SITE_OTHER): Payer: Medicare Other

## 2011-01-14 ENCOUNTER — Encounter: Payer: Self-pay | Admitting: Internal Medicine

## 2011-01-14 ENCOUNTER — Ambulatory Visit (INDEPENDENT_AMBULATORY_CARE_PROVIDER_SITE_OTHER): Payer: Medicare Other | Admitting: Internal Medicine

## 2011-01-14 VITALS — BP 110/72 | HR 73 | Temp 98.8°F | Ht 61.0 in | Wt 215.4 lb

## 2011-01-14 DIAGNOSIS — R7302 Impaired glucose tolerance (oral): Secondary | ICD-10-CM

## 2011-01-14 DIAGNOSIS — Z0000 Encounter for general adult medical examination without abnormal findings: Secondary | ICD-10-CM

## 2011-01-14 DIAGNOSIS — M79602 Pain in left arm: Secondary | ICD-10-CM

## 2011-01-14 DIAGNOSIS — I10 Essential (primary) hypertension: Secondary | ICD-10-CM

## 2011-01-14 DIAGNOSIS — R7309 Other abnormal glucose: Secondary | ICD-10-CM

## 2011-01-14 DIAGNOSIS — Z309 Encounter for contraceptive management, unspecified: Secondary | ICD-10-CM

## 2011-01-14 DIAGNOSIS — M7989 Other specified soft tissue disorders: Secondary | ICD-10-CM

## 2011-01-14 DIAGNOSIS — M25529 Pain in unspecified elbow: Secondary | ICD-10-CM

## 2011-01-14 DIAGNOSIS — Z23 Encounter for immunization: Secondary | ICD-10-CM

## 2011-01-14 DIAGNOSIS — M79609 Pain in unspecified limb: Secondary | ICD-10-CM

## 2011-01-14 DIAGNOSIS — M25522 Pain in left elbow: Secondary | ICD-10-CM

## 2011-01-14 DIAGNOSIS — E538 Deficiency of other specified B group vitamins: Secondary | ICD-10-CM

## 2011-01-14 DIAGNOSIS — IMO0001 Reserved for inherently not codable concepts without codable children: Secondary | ICD-10-CM

## 2011-01-14 LAB — HEPATIC FUNCTION PANEL
ALT: 30 U/L (ref 0–35)
AST: 32 U/L (ref 0–37)
Bilirubin, Direct: 0.1 mg/dL (ref 0.0–0.3)
Total Bilirubin: 0.6 mg/dL (ref 0.3–1.2)

## 2011-01-14 LAB — CBC WITH DIFFERENTIAL/PLATELET
Basophils Relative: 0.5 % (ref 0.0–3.0)
Eosinophils Absolute: 0.2 10*3/uL (ref 0.0–0.7)
Eosinophils Relative: 2.1 % (ref 0.0–5.0)
HCT: 36.7 % (ref 36.0–46.0)
Lymphs Abs: 2.1 10*3/uL (ref 0.7–4.0)
MCHC: 33.4 g/dL (ref 30.0–36.0)
MCV: 86.4 fl (ref 78.0–100.0)
Monocytes Absolute: 0.4 10*3/uL (ref 0.1–1.0)
Neutrophils Relative %: 61.7 % (ref 43.0–77.0)
Platelets: 285 10*3/uL (ref 150.0–400.0)
WBC: 7.1 10*3/uL (ref 4.5–10.5)

## 2011-01-14 LAB — BASIC METABOLIC PANEL
BUN: 10 mg/dL (ref 6–23)
CO2: 25 mEq/L (ref 19–32)
Chloride: 109 mEq/L (ref 96–112)
Creatinine, Ser: 0.7 mg/dL (ref 0.4–1.2)
Glucose, Bld: 89 mg/dL (ref 70–99)

## 2011-01-14 LAB — LIPID PANEL
Cholesterol: 133 mg/dL (ref 0–200)
VLDL: 49 mg/dL — ABNORMAL HIGH (ref 0.0–40.0)

## 2011-01-14 LAB — URINALYSIS, ROUTINE W REFLEX MICROSCOPIC
Bilirubin Urine: NEGATIVE
Hgb urine dipstick: NEGATIVE
Ketones, ur: NEGATIVE
Total Protein, Urine: NEGATIVE
pH: 6 (ref 5.0–8.0)

## 2011-01-14 LAB — TSH: TSH: 1.28 u[IU]/mL (ref 0.35–5.50)

## 2011-01-14 MED ORDER — CYANOCOBALAMIN 1000 MCG/ML IJ SOLN
1000.0000 ug | Freq: Once | INTRAMUSCULAR | Status: AC
Start: 2011-01-14 — End: 2011-01-14
  Administered 2011-01-14: 1000 ug via INTRAMUSCULAR

## 2011-01-14 MED ORDER — MEDROXYPROGESTERONE ACETATE 150 MG/ML IM SUSP
150.0000 mg | Freq: Once | INTRAMUSCULAR | Status: AC
  Administered 2011-01-14: 150 mg via INTRAMUSCULAR

## 2011-01-14 NOTE — Progress Notes (Signed)
Subjective:    Patient ID: Courtney [name], female    DOB: 13-Aug-1975, 35 y.o.   MRN: [id number]  HPI  Here to f/u LUE predicament, did take the prednisone and has some decrease in the overall LUE/left elbow pain, numbness and swelling;  Neg for DVT on doppler, did see ortho who recommended c-spine MRI but she declined as she had no neck pain;  Pain worst to the left elbow adn points to the lateral epicondylar area and post elbow, with numbness/swelling worst to the upper arm, without signficant pain or decreased ROM of the shoulder;  More distal arm with some swelling but much less than prior no soreness/pain, grip strength intact.  Pt denies chest pain, increased sob or doe, wheezing, orthopnea, PND, increased LE swelling, palpitations, dizziness or syncope.  Pt denies new neurological symptoms such as new headache, or facial or extremity weakness or numbness except for the above.   Pt denies polydipsia, polyuria.   Pt denies fever, wt loss, night sweats, loss of appetite, or other constitutional symptoms  Does not work.  Due for flu shot today, b12 and depoprovera.  No other new complaints Past Medical History  Diagnosis Date  . GERD (gastroesophageal reflux disease)   . Fibromyalgia   . Chronic fatigue   . CTS (carpal tunnel syndrome)   . Anemia, B12 deficiency   . Multiple sclerosis   . Hyperlipidemia   . C. difficile colitis   . Anxiety   . Heart murmur   . VITAMIN D DEFICIENCY 01/13/2010  . OBSTRUCTIVE SLEEP APNEA 12/08/2008  . Irritable bowel syndrome 05/24/2010  . HYPERLIPIDEMIA 03/13/2007  . ESSENTIAL HYPERTENSION 03/13/2007  . CARPAL TUNNEL SYNDROME, BILATERAL 01/13/2010  . ALLERGIC RHINITIS 03/13/2007   Past Surgical History  Procedure Date  . Carpal tunnel release 01/2010  . Colonoscopy w/ biopsies and polypectomy 05/2009    2 tubular adenomas    reports that she has never smoked. She has never used smokeless tobacco. She reports that she does not drink alcohol or use illicit  drugs. family history includes Diabetes in her other; Heart attack in her maternal grandfather; Hypertension in her other; Lung cancer in her maternal grandmother; Migraines in her mother; Prostate cancer in her maternal uncle; and Stroke in her maternal grandfather.  There is no history of Colon cancer. Allergies  Allergen Reactions  . Penicillins     REACTION: rash   Current Outpatient Prescriptions on File Prior to Visit  Medication Sig Dispense Refill  . cetirizine (ZYRTEC) 10 MG tablet Take 10 mg by mouth daily.        . Cyanocobalamin (YL VITAMIN B-12 CR) 1000 MCG TBCR Take by mouth. 1 injection every month       . LASIX 40 MG tablet TAKE ONE TABLET DAILY.  90 each  2  . LOPRESSOR 100 MG tablet TAKE 1 TABLET BY MOUTH   TWICE DAILY.  60 each  10  . medroxyPROGESTERone (DEPO-PROVERA) 150 MG/ML injection Inject 150 mg into the muscle every 3 (three) months.        . nortriptyline (PAMELOR) 50 MG capsule Take 50 mg by mouth 2 (two) times daily. At bedtime       . omeprazole-sodium bicarbonate (ZEGERID) 40-1100 MG per capsule Take 1 capsule by mouth daily.        . potassium chloride (K-DUR,KLOR-CON) 10 MEQ tablet TAKE ONE TABLET DAILY.  90 tablet  3  . pregabalin (LYRICA) 75 MG capsule Take 75 mg by mouth 2 (  two) times daily.        . simvastatin (ZOCOR) 40 MG tablet Take 1 tablet (40 mg total) by mouth daily. daily  30 tablet  11   Review of Systems Review of Systems  Constitutional: Negative for diaphoresis and unexpected weight change.  HENT: Negative for drooling and tinnitus.   Eyes: Negative for photophobia and visual disturbance.  Respiratory: Negative for choking and stridor.   Gastrointestinal: Negative for vomiting and blood in stool.  Genitourinary: Negative for hematuria and decreased urine volume.  Musculoskeletal: Negative for gait problem.  Skin: Negative for color change and wound.  Neurological: Negative for tremors and numbness. except for the  above Psychiatric/Behavioral: Negative for decreased concentration. The patient is not hyperactive.       Objective:   Physical Exam BP 110/72  Pulse 73  Temp(Src) 98.8 F (37.1 C) (Oral)  Ht 5\' 1"  (1.549 m)  Wt 215 lb 6 oz (97.693 kg)  BMI 40.69 kg/m2  SpO2 98% Physical Exam  VS noted Constitutional: Pt appears well-developed and well-nourished.  HENT: Head: Normocephalic.  Right Ear: External ear normal.  Left Ear: External ear normal.  Eyes: Conjunctivae and EOM are normal. Pupils are equal, round, and reactive to light.  Neck: Normal range of motion. Neck supple.  Cardiovascular: Normal rate and regular rhythm.   Pulmonary/Chest: Effort normal and breath sounds normal.  Neurological: Pt is alert. No cranial nerve deficit.  Skin: Skin is warm. No erythema.  Psychiatric: Pt behavior is normal. Thought content normal.  LUE with less but persistent diffuse swelling now primarily Left elbow and humerus area, mild diffuse tender left elbow but FROM, no rash and o/w neurovasc intact       Assessment & Plan:

## 2011-01-14 NOTE — Patient Instructions (Addendum)
You will be contacted regarding the referral for: MRI left arm and elbow You had the flu shot today, b12, and depo shots today Continue all other medications as before Please go to LAB in the Basement for the blood and/or urine tests to be done today Please call the phone number [phone] (the PhoneTree System) for results of testing in 2-3 days;  When calling, simply dial the number, and when prompted enter the MRN number above (the Medical Record Number) and the # key, then the message should start. Please return in 6 months, or sooner if needed

## 2011-01-15 ENCOUNTER — Encounter: Payer: Self-pay | Admitting: Internal Medicine

## 2011-01-15 NOTE — Assessment & Plan Note (Signed)
stable overall by hx and exam, most recent data reviewed with pt, and pt to continue medical treatment as before  BP Readings from Last 3 Encounters:  01/14/11 110/72  12/23/10 130/70  10/15/10 110/80

## 2011-01-15 NOTE — Assessment & Plan Note (Signed)
stable overall by hx and exam, most recent data reviewed with pt, and pt to continue medical treatment as before  Lab Results  Component Value Date   WBC 7.1 12/23/2010   HGB 13.0 12/23/2010   HCT 38.7 12/23/2010   PLT 314.0 12/23/2010   CHOL 153 01/12/2010   TRIG 264.0* 01/12/2010   HDL 40.60 01/12/2010   LDLDIRECT 80.7 01/12/2010   ALT 23 01/12/2010   AST 25 01/12/2010   NA 140 12/23/2010   K 4.0 12/23/2010   CL 105 12/23/2010   CREATININE 0.9 12/23/2010   BUN 10 12/23/2010   CO2 26 12/23/2010   TSH 2.32 01/12/2010   INR 1.0 12/23/2010

## 2011-01-15 NOTE — Assessment & Plan Note (Signed)
For routine replacement today IM,  to f/u any worsening symptoms or concerns

## 2011-01-15 NOTE — Assessment & Plan Note (Addendum)
Unclear etiology , d/w rheum - Dr Dierdre Forth by phone, for MRI elbow, consider rheum eval,  to f/u any worsening symptoms or concerns

## 2011-01-15 NOTE — Assessment & Plan Note (Signed)
Left upper arm with diffuse numb/swelling - for MRI,  to f/u any worsening symptoms or concerns, consdier rheum eval

## 2011-01-24 ENCOUNTER — Ambulatory Visit
Admission: RE | Admit: 2011-01-24 | Discharge: 2011-01-24 | Disposition: A | Discharge status: Home / Self Care | Payer: Medicare Other | Source: Ambulatory Visit | Attending: Internal Medicine | Admitting: Internal Medicine

## 2011-01-24 DIAGNOSIS — M25522 Pain in left elbow: Secondary | ICD-10-CM

## 2011-02-14 ENCOUNTER — Ambulatory Visit (INDEPENDENT_AMBULATORY_CARE_PROVIDER_SITE_OTHER): Payer: Medicare Other

## 2011-02-14 DIAGNOSIS — E538 Deficiency of other specified B group vitamins: Secondary | ICD-10-CM

## 2011-02-14 MED ORDER — CYANOCOBALAMIN 1000 MCG/ML IJ SOLN
1000.0000 ug | Freq: Once | INTRAMUSCULAR | Status: AC
  Administered 2011-02-14: 1000 ug via INTRAMUSCULAR

## 2011-03-17 ENCOUNTER — Ambulatory Visit (INDEPENDENT_AMBULATORY_CARE_PROVIDER_SITE_OTHER): Payer: Medicare Other

## 2011-03-17 DIAGNOSIS — E538 Deficiency of other specified B group vitamins: Secondary | ICD-10-CM

## 2011-03-17 MED ORDER — CYANOCOBALAMIN 1000 MCG/ML IJ SOLN
1000.0000 ug | Freq: Once | INTRAMUSCULAR | Status: AC
  Administered 2011-03-17: 1000 ug via INTRAMUSCULAR

## 2011-04-01 ENCOUNTER — Other Ambulatory Visit: Payer: Self-pay

## 2011-04-01 MED ORDER — PREGABALIN 75 MG PO CAPS: 75.0000 mg | ORAL_CAPSULE | Freq: Two times a day (BID) | ORAL | Status: DC

## 2011-04-01 MED ORDER — NORTRIPTYLINE HCL 50 MG PO CAPS: 50.0000 mg | ORAL_CAPSULE | Freq: Two times a day (BID) | ORAL | Status: DC

## 2011-04-01 NOTE — Telephone Encounter (Signed)
Patient is requesting refill on Lyrica and Nortriptyling. Her Neurologist normally fills these 2 medications, but she has called their office and has gotten no response. Also cannot get in until Feb. Please advise as she is out of both of these medications. Call back number [phone]

## 2011-04-01 NOTE — Telephone Encounter (Signed)
Ok for limited rx, but please to refill further per neuro

## 2011-04-01 NOTE — Telephone Encounter (Signed)
Patient informed of prescriptions sent to [facility] and informed of MD's instructions for future refills of these medications.

## 2011-04-14 ENCOUNTER — Ambulatory Visit (INDEPENDENT_AMBULATORY_CARE_PROVIDER_SITE_OTHER): Payer: Medicare Other

## 2011-04-14 DIAGNOSIS — E538 Deficiency of other specified B group vitamins: Secondary | ICD-10-CM

## 2011-04-14 DIAGNOSIS — Z309 Encounter for contraceptive management, unspecified: Secondary | ICD-10-CM

## 2011-04-14 MED ORDER — CYANOCOBALAMIN 1000 MCG/ML IJ SOLN
1000.0000 ug | Freq: Once | INTRAMUSCULAR | Status: AC
  Administered 2011-04-14: 1000 ug via INTRAMUSCULAR

## 2011-04-14 MED ORDER — MEDROXYPROGESTERONE ACETATE 150 MG/ML IM SUSP
150.0000 mg | Freq: Once | INTRAMUSCULAR | Status: AC
  Administered 2011-04-14: 150 mg via INTRAMUSCULAR

## 2011-04-20 ENCOUNTER — Other Ambulatory Visit: Payer: Self-pay

## 2011-04-20 MED ORDER — SIMVASTATIN 40 MG PO TABS: ORAL_TABLET | ORAL | Status: DC

## 2011-04-20 MED ORDER — POTASSIUM CHLORIDE CRYS ER 10 MEQ PO TBCR: EXTENDED_RELEASE_TABLET | ORAL | Status: DC

## 2011-04-20 MED ORDER — FUROSEMIDE 40 MG PO TABS: ORAL_TABLET | ORAL | Status: DC

## 2011-04-20 MED ORDER — METOPROLOL TARTRATE 100 MG PO TABS: ORAL_TABLET | ORAL | Status: DC

## 2011-05-16 ENCOUNTER — Ambulatory Visit (INDEPENDENT_AMBULATORY_CARE_PROVIDER_SITE_OTHER): Payer: Medicare Other

## 2011-05-16 DIAGNOSIS — E538 Deficiency of other specified B group vitamins: Secondary | ICD-10-CM

## 2011-05-16 MED ORDER — CYANOCOBALAMIN 1000 MCG/ML IJ SOLN
1000.0000 ug | Freq: Once | INTRAMUSCULAR | Status: AC
  Administered 2011-05-16: 1000 ug via INTRAMUSCULAR

## 2011-05-30 DIAGNOSIS — M542 Cervicalgia: Secondary | ICD-10-CM | POA: Diagnosis not present

## 2011-05-30 DIAGNOSIS — G35 Multiple sclerosis: Secondary | ICD-10-CM | POA: Diagnosis not present

## 2011-06-03 DIAGNOSIS — G35 Multiple sclerosis: Secondary | ICD-10-CM | POA: Diagnosis not present

## 2011-06-03 DIAGNOSIS — M542 Cervicalgia: Secondary | ICD-10-CM | POA: Diagnosis not present

## 2011-06-03 DIAGNOSIS — G35D Multiple sclerosis, unspecified: Secondary | ICD-10-CM | POA: Diagnosis not present

## 2011-06-08 DIAGNOSIS — G35 Multiple sclerosis: Secondary | ICD-10-CM | POA: Diagnosis not present

## 2011-06-14 DIAGNOSIS — IMO0002 Reserved for concepts with insufficient information to code with codable children: Secondary | ICD-10-CM | POA: Diagnosis not present

## 2011-06-14 DIAGNOSIS — M542 Cervicalgia: Secondary | ICD-10-CM | POA: Diagnosis not present

## 2011-06-14 DIAGNOSIS — G35 Multiple sclerosis: Secondary | ICD-10-CM | POA: Diagnosis not present

## 2011-06-14 DIAGNOSIS — M25519 Pain in unspecified shoulder: Secondary | ICD-10-CM | POA: Diagnosis not present

## 2011-06-14 DIAGNOSIS — K589 Irritable bowel syndrome without diarrhea: Secondary | ICD-10-CM | POA: Diagnosis not present

## 2011-06-14 DIAGNOSIS — Z79899 Other long term (current) drug therapy: Secondary | ICD-10-CM | POA: Diagnosis not present

## 2011-06-15 ENCOUNTER — Ambulatory Visit: Payer: Medicare Other

## 2011-06-15 DIAGNOSIS — G35 Multiple sclerosis: Secondary | ICD-10-CM | POA: Diagnosis not present

## 2011-06-15 DIAGNOSIS — IMO0002 Reserved for concepts with insufficient information to code with codable children: Secondary | ICD-10-CM | POA: Diagnosis not present

## 2011-06-15 DIAGNOSIS — M25519 Pain in unspecified shoulder: Secondary | ICD-10-CM | POA: Diagnosis not present

## 2011-06-15 DIAGNOSIS — M542 Cervicalgia: Secondary | ICD-10-CM | POA: Diagnosis not present

## 2011-06-15 DIAGNOSIS — K589 Irritable bowel syndrome without diarrhea: Secondary | ICD-10-CM | POA: Diagnosis not present

## 2011-06-17 DIAGNOSIS — IMO0002 Reserved for concepts with insufficient information to code with codable children: Secondary | ICD-10-CM | POA: Diagnosis not present

## 2011-06-17 DIAGNOSIS — M25519 Pain in unspecified shoulder: Secondary | ICD-10-CM | POA: Diagnosis not present

## 2011-06-17 DIAGNOSIS — G35 Multiple sclerosis: Secondary | ICD-10-CM | POA: Diagnosis not present

## 2011-06-17 DIAGNOSIS — K589 Irritable bowel syndrome without diarrhea: Secondary | ICD-10-CM | POA: Diagnosis not present

## 2011-06-17 DIAGNOSIS — M542 Cervicalgia: Secondary | ICD-10-CM | POA: Diagnosis not present

## 2011-06-18 DIAGNOSIS — G35 Multiple sclerosis: Secondary | ICD-10-CM | POA: Diagnosis not present

## 2011-06-18 DIAGNOSIS — M25519 Pain in unspecified shoulder: Secondary | ICD-10-CM | POA: Diagnosis not present

## 2011-06-18 DIAGNOSIS — IMO0002 Reserved for concepts with insufficient information to code with codable children: Secondary | ICD-10-CM | POA: Diagnosis not present

## 2011-06-18 DIAGNOSIS — K589 Irritable bowel syndrome without diarrhea: Secondary | ICD-10-CM | POA: Diagnosis not present

## 2011-06-18 DIAGNOSIS — M542 Cervicalgia: Secondary | ICD-10-CM | POA: Diagnosis not present

## 2011-06-23 DIAGNOSIS — M25519 Pain in unspecified shoulder: Secondary | ICD-10-CM | POA: Diagnosis not present

## 2011-06-23 DIAGNOSIS — M542 Cervicalgia: Secondary | ICD-10-CM | POA: Diagnosis not present

## 2011-06-23 DIAGNOSIS — K589 Irritable bowel syndrome without diarrhea: Secondary | ICD-10-CM | POA: Diagnosis not present

## 2011-06-23 DIAGNOSIS — IMO0002 Reserved for concepts with insufficient information to code with codable children: Secondary | ICD-10-CM | POA: Diagnosis not present

## 2011-06-23 DIAGNOSIS — G35 Multiple sclerosis: Secondary | ICD-10-CM | POA: Diagnosis not present

## 2011-07-05 DIAGNOSIS — G35 Multiple sclerosis: Secondary | ICD-10-CM | POA: Diagnosis not present

## 2011-07-13 ENCOUNTER — Ambulatory Visit (INDEPENDENT_AMBULATORY_CARE_PROVIDER_SITE_OTHER): Payer: Medicare Other | Admitting: Internal Medicine

## 2011-07-13 ENCOUNTER — Encounter: Payer: Self-pay | Admitting: Internal Medicine

## 2011-07-13 VITALS — BP 112/78 | HR 74 | Temp 98.3°F | Ht 62.0 in | Wt 215.5 lb

## 2011-07-13 DIAGNOSIS — R7302 Impaired glucose tolerance (oral): Secondary | ICD-10-CM

## 2011-07-13 DIAGNOSIS — N889 Noninflammatory disorder of cervix uteri, unspecified: Secondary | ICD-10-CM

## 2011-07-13 DIAGNOSIS — E785 Hyperlipidemia, unspecified: Secondary | ICD-10-CM

## 2011-07-13 DIAGNOSIS — Z309 Encounter for contraceptive management, unspecified: Secondary | ICD-10-CM | POA: Diagnosis not present

## 2011-07-13 DIAGNOSIS — R7309 Other abnormal glucose: Secondary | ICD-10-CM

## 2011-07-13 DIAGNOSIS — E538 Deficiency of other specified B group vitamins: Secondary | ICD-10-CM | POA: Diagnosis not present

## 2011-07-13 DIAGNOSIS — I10 Essential (primary) hypertension: Secondary | ICD-10-CM

## 2011-07-13 HISTORY — DX: Noninflammatory disorder of cervix uteri, unspecified: N88.9

## 2011-07-13 MED ORDER — METHYLPREDNISOLONE ACETATE 80 MG/ML IJ SUSP: 120.0000 mg | Freq: Once | INTRAMUSCULAR | Status: DC

## 2011-07-13 MED ORDER — MEDROXYPROGESTERONE ACETATE 150 MG/ML IM SUSP
150.0000 mg | Freq: Once | INTRAMUSCULAR | Status: AC
  Administered 2011-07-13: 150 mg via INTRAMUSCULAR

## 2011-07-13 MED ORDER — CYANOCOBALAMIN 1000 MCG/ML IJ SOLN
1000.0000 ug | Freq: Once | INTRAMUSCULAR | Status: AC
  Administered 2011-07-13: 1000 ug via INTRAMUSCULAR

## 2011-07-13 NOTE — Assessment & Plan Note (Signed)
stable overall by hx and exam, most recent data reviewed with pt from 6 mo ago (gave copy) - tot chol 133, and pt to continue medical treatment as before

## 2011-07-13 NOTE — Patient Instructions (Signed)
OK to Continue all other medications as before Please keep your appointments with your specialists as you have planned - neurology and repeat MRI Please return in 6 months, or sooner if needed

## 2011-07-13 NOTE — Assessment & Plan Note (Signed)
For routine b12 replacement IM today,  to f/u any worsening symptoms or concerns

## 2011-07-13 NOTE — Assessment & Plan Note (Signed)
Spine lesion - indeterminate, for f/u MRI may 2013 per neurology

## 2011-07-13 NOTE — Assessment & Plan Note (Signed)
stable overall by hx and exam, most recent data reviewed with pt, and pt to continue medical treatment as before Lab Results  Component Value Date   HGBA1C 6.4 01/14/2011      stable overall by hx and exam, most recent data reviewed with pt, and pt to continue medical treatment as before  Lab Results  Component Value Date   HGBA1C 6.4 01/14/2011

## 2011-07-13 NOTE — Assessment & Plan Note (Signed)
stable overall by hx and exam, most recent data reviewed with pt, and pt to continue medical treatment as before BP Readings from Last 3 Encounters:  07/13/11 112/78  01/14/11 110/72  12/23/10 130/70

## 2011-07-13 NOTE — Assessment & Plan Note (Signed)
For routine depoprovera today,  to f/u any worsening symptoms or concerns

## 2011-07-13 NOTE — Progress Notes (Signed)
Subjective:    Patient ID: Courtney [name], female    DOB: [birth year], 36 y.o.   MRN: [id number]  HPI  Here to f/u; since seen here last, has seen [facility] adn orthopedic with further MRI done for the brain, c-spine and left shoudler with finding of cervical cord abnormality indeterminate now but cant r/o MS lesion vs expanding tumor;  S/p 5 days IV solumedrol  (feb 26-mar2), no help symptomaticlaly, had f/u repeat MRi c-spine mar 19 with slight decrease lesion size, but still cant r/o tumor, for 3rd MRI to be done 6 wks from that date with possibility of referral to [facility] for eval and tx.  Still having neck and shoulder pain as before;   Pt denies fever, wt loss, night sweats, loss of appetite, or other constitutional symptoms  Pt denies chest pain, increased sob or doe, wheezing, orthopnea, PND, increased LE swelling, palpitations, dizziness or syncope.   Pt denies polydipsia, polyuria, or low sugar symptoms such as weakness or confusion improved with po intake.  Pt states overall good compliance with meds, trying to follow lower cholesterol, diabetic diet, wt overall stable but little exercise however.   Due for depoprovera shot, and b12 today.   Past Medical History  Diagnosis Date  . GERD (gastroesophageal reflux disease)   . Fibromyalgia   . Chronic fatigue   . CTS (carpal tunnel syndrome)   . Anemia, B12 deficiency   . Multiple sclerosis   . Hyperlipidemia   . C. difficile colitis   . Anxiety   . Heart murmur   . VITAMIN D DEFICIENCY 01/13/2010  . OBSTRUCTIVE SLEEP APNEA 12/08/2008  . Irritable bowel syndrome 05/24/2010  . HYPERLIPIDEMIA 03/13/2007  . ESSENTIAL HYPERTENSION 03/13/2007  . CARPAL TUNNEL SYNDROME, BILATERAL 01/13/2010  . ALLERGIC RHINITIS 03/13/2007   Past Surgical History  Procedure Date  . Carpal tunnel release 01/2010  . Colonoscopy w/ biopsies and polypectomy 05/2009    2 tubular adenomas    reports that she has never smoked. She has never used smokeless tobacco. She  reports that she does not drink alcohol or use illicit drugs. family history includes Diabetes in her other; Heart attack in her maternal grandfather; Hypertension in her other; Lung cancer in her maternal grandmother; Migraines in her mother; Prostate cancer in her maternal uncle; and Stroke in her maternal grandfather.  There is no history of Colon cancer. Allergies  Allergen Reactions  . Penicillins     REACTION: rash   Current Outpatient Prescriptions on File Prior to Visit  Medication Sig Dispense Refill  . cetirizine (ZYRTEC) 10 MG tablet Take 10 mg by mouth daily.        . Cyanocobalamin (YL VITAMIN B-12 CR) 1000 MCG TBCR Take by mouth. 1 injection every month       . furosemide (LASIX) 40 MG tablet Take one tablet daily  90 tablet  3  . medroxyPROGESTERone (DEPO-PROVERA) 150 MG/ML injection Inject 150 mg into the muscle every 3 (three) months.        . metoprolol (LOPRESSOR) 100 MG tablet Take two tablets daily  180 tablet  3  . nortriptyline (PAMELOR) 50 MG capsule Take 1 capsule (50 mg total) by mouth 2 (two) times daily. At bedtime  180 capsule  0  . omeprazole-sodium bicarbonate (ZEGERID) 40-1100 MG per capsule Take 1 capsule by mouth daily.        . potassium chloride (K-DUR,KLOR-CON) 10 MEQ tablet Take one tablet daily  90 tablet  3  .  pregabalin (LYRICA) 75 MG capsule Take 1 capsule (75 mg total) by mouth 2 (two) times daily.  180 capsule  0  . simvastatin (ZOCOR) 40 MG tablet Take one tablet daily  90 tablet  3   No current facility-administered medications on file prior to visit.   Review of Systems Review of Systems  Constitutional: Negative for diaphoresis and unexpected weight change.  HENT: Negative for drooling and tinnitus.   Eyes: Negative for photophobia and visual disturbance.  Respiratory: Negative for choking and stridor.   Gastrointestinal: Negative for vomiting and blood in stool.  Genitourinary: Negative for hematuria and decreased urine volume.    Musculoskeletal: Negative for gait problem.  Skin: Negative for color change and wound.  Neurological: Negative for tremors and numbness. except for chronic tremor right hand due to MS Psychiatric/Behavioral: Negative for decreased concentration. The patient is not hyperactive.       Objective:   Physical Exam BP 112/78  Pulse 74  Temp(Src) 98.3 F (36.8 C) (Oral)  Ht 5\' 2"  (1.575 m)  Wt 215 lb 8 oz (97.75 kg)  BMI 39.42 kg/m2  SpO2 98% Physical Exam  VS noted Constitutional: Pt is oriented to person, place, and time. Appears well-developed and well-nourished.  HENT:  Head: Normocephalic and atraumatic.  Right Ear: External ear normal.  Left Ear: External ear normal.  Nose: Nose normal.  Mouth/Throat: Oropharynx is clear and moist.  Eyes: Conjunctivae and EOM are normal. Pupils are equal, round, and reactive to light.  Neck: Normal range of motion. Neck supple. No JVD present. No tracheal deviation present.  Cardiovascular: Normal rate, regular rhythm, normal heart sounds and intact distal pulses.   Pulmonary/Chest: Effort normal and breath sounds normal.  Abdominal: Soft. Bowel sounds are normal. There is no tenderness.  Musculoskeletal: Normal range of motion. Exhibits no edema.  Lymphadenopathy:  Has no cervical adenopathy.  Neurological: Pt is alert and oriented to person, place, and time. Pt has normal reflexes. No cranial nerve deficit. O/w not done in detail Small tremor right hand notee Skin: Skin is warm and dry. No rash noted.  Psychiatric:  Has  normal mood and affect. Behavior is normal.     Assessment & Plan:

## 2011-08-12 ENCOUNTER — Ambulatory Visit (INDEPENDENT_AMBULATORY_CARE_PROVIDER_SITE_OTHER): Payer: Medicare Other

## 2011-08-12 DIAGNOSIS — E538 Deficiency of other specified B group vitamins: Secondary | ICD-10-CM

## 2011-08-12 MED ORDER — CYANOCOBALAMIN 1000 MCG/ML IJ SOLN
1000.0000 ug | Freq: Once | INTRAMUSCULAR | Status: AC
  Administered 2011-08-12: 1000 ug via INTRAMUSCULAR

## 2011-08-15 DIAGNOSIS — G35 Multiple sclerosis: Secondary | ICD-10-CM | POA: Diagnosis not present

## 2011-08-27 DIAGNOSIS — G35 Multiple sclerosis: Secondary | ICD-10-CM | POA: Diagnosis not present

## 2011-09-07 ENCOUNTER — Ambulatory Visit: Payer: Medicare Other | Attending: Neurology

## 2011-09-07 DIAGNOSIS — R5381 Other malaise: Secondary | ICD-10-CM | POA: Diagnosis not present

## 2011-09-07 DIAGNOSIS — M6281 Muscle weakness (generalized): Secondary | ICD-10-CM | POA: Diagnosis not present

## 2011-09-07 DIAGNOSIS — R293 Abnormal posture: Secondary | ICD-10-CM | POA: Insufficient documentation

## 2011-09-07 DIAGNOSIS — IMO0001 Reserved for inherently not codable concepts without codable children: Secondary | ICD-10-CM | POA: Insufficient documentation

## 2011-09-07 DIAGNOSIS — M2550 Pain in unspecified joint: Secondary | ICD-10-CM | POA: Diagnosis not present

## 2011-09-13 ENCOUNTER — Ambulatory Visit (INDEPENDENT_AMBULATORY_CARE_PROVIDER_SITE_OTHER): Payer: Medicare Other

## 2011-09-13 DIAGNOSIS — E538 Deficiency of other specified B group vitamins: Secondary | ICD-10-CM

## 2011-09-13 MED ORDER — CYANOCOBALAMIN 1000 MCG/ML IJ SOLN
1000.0000 ug | Freq: Once | INTRAMUSCULAR | Status: AC
  Administered 2011-09-13: 1000 ug via INTRAMUSCULAR

## 2011-09-14 ENCOUNTER — Ambulatory Visit: Payer: Medicare Other | Admitting: Physical Therapy

## 2011-09-14 DIAGNOSIS — R5381 Other malaise: Secondary | ICD-10-CM | POA: Diagnosis not present

## 2011-09-14 DIAGNOSIS — M6281 Muscle weakness (generalized): Secondary | ICD-10-CM | POA: Diagnosis not present

## 2011-09-14 DIAGNOSIS — M2550 Pain in unspecified joint: Secondary | ICD-10-CM | POA: Diagnosis not present

## 2011-09-14 DIAGNOSIS — IMO0001 Reserved for inherently not codable concepts without codable children: Secondary | ICD-10-CM | POA: Diagnosis not present

## 2011-09-14 DIAGNOSIS — R293 Abnormal posture: Secondary | ICD-10-CM | POA: Diagnosis not present

## 2011-09-15 ENCOUNTER — Ambulatory Visit: Payer: Medicare Other

## 2011-09-15 DIAGNOSIS — M6281 Muscle weakness (generalized): Secondary | ICD-10-CM | POA: Diagnosis not present

## 2011-09-15 DIAGNOSIS — R5381 Other malaise: Secondary | ICD-10-CM | POA: Diagnosis not present

## 2011-09-15 DIAGNOSIS — IMO0001 Reserved for inherently not codable concepts without codable children: Secondary | ICD-10-CM | POA: Diagnosis not present

## 2011-09-15 DIAGNOSIS — M2550 Pain in unspecified joint: Secondary | ICD-10-CM | POA: Diagnosis not present

## 2011-09-15 DIAGNOSIS — R293 Abnormal posture: Secondary | ICD-10-CM | POA: Diagnosis not present

## 2011-09-19 ENCOUNTER — Ambulatory Visit: Payer: Medicare Other | Attending: Neurology

## 2011-09-19 DIAGNOSIS — M6281 Muscle weakness (generalized): Secondary | ICD-10-CM | POA: Insufficient documentation

## 2011-09-19 DIAGNOSIS — R293 Abnormal posture: Secondary | ICD-10-CM | POA: Insufficient documentation

## 2011-09-19 DIAGNOSIS — R5381 Other malaise: Secondary | ICD-10-CM | POA: Insufficient documentation

## 2011-09-19 DIAGNOSIS — IMO0001 Reserved for inherently not codable concepts without codable children: Secondary | ICD-10-CM | POA: Insufficient documentation

## 2011-09-19 DIAGNOSIS — M2550 Pain in unspecified joint: Secondary | ICD-10-CM | POA: Insufficient documentation

## 2011-09-21 ENCOUNTER — Ambulatory Visit: Payer: Medicare Other

## 2011-09-27 ENCOUNTER — Ambulatory Visit: Payer: Medicare Other

## 2011-09-27 DIAGNOSIS — G35 Multiple sclerosis: Secondary | ICD-10-CM | POA: Diagnosis not present

## 2011-09-29 ENCOUNTER — Ambulatory Visit: Payer: Medicare Other

## 2011-10-04 ENCOUNTER — Ambulatory Visit: Payer: Medicare Other

## 2011-10-06 ENCOUNTER — Ambulatory Visit: Payer: Medicare Other

## 2011-10-10 ENCOUNTER — Ambulatory Visit: Payer: Medicare Other

## 2011-10-11 ENCOUNTER — Ambulatory Visit (INDEPENDENT_AMBULATORY_CARE_PROVIDER_SITE_OTHER): Payer: Medicare Other | Admitting: *Deleted

## 2011-10-11 DIAGNOSIS — E538 Deficiency of other specified B group vitamins: Secondary | ICD-10-CM

## 2011-10-11 DIAGNOSIS — Z309 Encounter for contraceptive management, unspecified: Secondary | ICD-10-CM | POA: Diagnosis not present

## 2011-10-11 MED ORDER — CYANOCOBALAMIN 1000 MCG/ML IJ SOLN
1000.0000 ug | Freq: Once | INTRAMUSCULAR | Status: AC
  Administered 2011-10-11: 1000 ug via INTRAMUSCULAR

## 2011-10-11 MED ORDER — MEDROXYPROGESTERONE ACETATE 150 MG/ML IM SUSP
150.0000 mg | Freq: Once | INTRAMUSCULAR | Status: AC
  Administered 2011-10-11: 150 mg via INTRAMUSCULAR

## 2011-10-14 ENCOUNTER — Ambulatory Visit: Payer: Medicare Other

## 2011-10-19 ENCOUNTER — Ambulatory Visit: Payer: Medicare Other | Attending: Neurology

## 2011-10-19 DIAGNOSIS — R5381 Other malaise: Secondary | ICD-10-CM | POA: Insufficient documentation

## 2011-10-19 DIAGNOSIS — R293 Abnormal posture: Secondary | ICD-10-CM | POA: Insufficient documentation

## 2011-10-19 DIAGNOSIS — IMO0001 Reserved for inherently not codable concepts without codable children: Secondary | ICD-10-CM | POA: Insufficient documentation

## 2011-10-19 DIAGNOSIS — M6281 Muscle weakness (generalized): Secondary | ICD-10-CM | POA: Diagnosis not present

## 2011-10-19 DIAGNOSIS — M2550 Pain in unspecified joint: Secondary | ICD-10-CM | POA: Diagnosis not present

## 2011-10-21 ENCOUNTER — Ambulatory Visit: Payer: Medicare Other | Admitting: Physical Therapy

## 2011-11-10 ENCOUNTER — Ambulatory Visit: Payer: Medicare Other

## 2011-12-06 DIAGNOSIS — G35 Multiple sclerosis: Secondary | ICD-10-CM | POA: Diagnosis not present

## 2012-01-09 ENCOUNTER — Encounter: Payer: Self-pay | Admitting: Internal Medicine

## 2012-01-09 ENCOUNTER — Ambulatory Visit (INDEPENDENT_AMBULATORY_CARE_PROVIDER_SITE_OTHER): Payer: Medicare Other | Admitting: Internal Medicine

## 2012-01-09 ENCOUNTER — Other Ambulatory Visit (INDEPENDENT_AMBULATORY_CARE_PROVIDER_SITE_OTHER): Payer: Medicare Other

## 2012-01-09 VITALS — BP 100/62 | HR 64 | Temp 98.9°F | Ht 61.0 in | Wt 204.1 lb

## 2012-01-09 DIAGNOSIS — R5381 Other malaise: Secondary | ICD-10-CM

## 2012-01-09 DIAGNOSIS — E785 Hyperlipidemia, unspecified: Secondary | ICD-10-CM

## 2012-01-09 DIAGNOSIS — R7309 Other abnormal glucose: Secondary | ICD-10-CM

## 2012-01-09 DIAGNOSIS — E538 Deficiency of other specified B group vitamins: Secondary | ICD-10-CM | POA: Diagnosis not present

## 2012-01-09 DIAGNOSIS — Z309 Encounter for contraceptive management, unspecified: Secondary | ICD-10-CM | POA: Diagnosis not present

## 2012-01-09 DIAGNOSIS — Z23 Encounter for immunization: Secondary | ICD-10-CM

## 2012-01-09 DIAGNOSIS — R5383 Other fatigue: Secondary | ICD-10-CM

## 2012-01-09 DIAGNOSIS — R7302 Impaired glucose tolerance (oral): Secondary | ICD-10-CM

## 2012-01-09 DIAGNOSIS — I10 Essential (primary) hypertension: Secondary | ICD-10-CM

## 2012-01-09 LAB — URINALYSIS, ROUTINE W REFLEX MICROSCOPIC
Bilirubin Urine: NEGATIVE
Nitrite: NEGATIVE
Total Protein, Urine: NEGATIVE
pH: 6.5 (ref 5.0–8.0)

## 2012-01-09 LAB — HEMOGLOBIN A1C: Hgb A1c MFr Bld: 5.5 % (ref 4.6–6.5)

## 2012-01-09 MED ORDER — MEDROXYPROGESTERONE ACETATE 150 MG/ML IM SUSP
150.0000 mg | Freq: Once | INTRAMUSCULAR | Status: AC
  Administered 2012-01-09: 150 mg via INTRAMUSCULAR

## 2012-01-09 MED ORDER — CYANOCOBALAMIN 1000 MCG/ML IJ SOLN
1000.0000 ug | Freq: Once | INTRAMUSCULAR | Status: AC
  Administered 2012-01-09: 1000 ug via INTRAMUSCULAR

## 2012-01-09 MED ORDER — SIMVASTATIN 40 MG PO TABS: ORAL_TABLET | ORAL | Status: DC

## 2012-01-09 MED ORDER — POTASSIUM CHLORIDE CRYS ER 10 MEQ PO TBCR: EXTENDED_RELEASE_TABLET | ORAL | Status: DC

## 2012-01-09 MED ORDER — METOPROLOL TARTRATE 100 MG PO TABS: ORAL_TABLET | ORAL | Status: DC

## 2012-01-09 MED ORDER — FUROSEMIDE 40 MG PO TABS: ORAL_TABLET | ORAL | Status: DC

## 2012-01-09 NOTE — Assessment & Plan Note (Signed)
stable overall by hx and exam, most recent data reviewed with pt, and pt to continue medical treatment as before Lab Results  Component Value Date   CHOL 133 01/14/2011   HDL 39.90 01/14/2011   LDLDIRECT 80.7 01/12/2010   TRIG 245.0* 01/14/2011   CHOLHDL 3 01/14/2011

## 2012-01-09 NOTE — Assessment & Plan Note (Signed)
Overall doing well, age appropriate education and counseling updated, referrals for preventative services and immunizations addressed, dietary and smoking counseling addressed, most recent labs and ECG reviewed.  I have personally reviewed and have noted: 1) the patient's medical and social history 2) The pt's use of alcohol, tobacco, and illicit drugs 3) The patient's current medications and supplements 4) Functional ability including ADL's, fall risk, home safety risk, hearing and visual impairment 5) Diet and physical activities 6) Evidence for depression or mood disorder 7) The patient's height, weight, and BMI have been recorded in the chart I have made referrals, and provided counseling and education based on review of the above For lab today

## 2012-01-09 NOTE — Assessment & Plan Note (Signed)
Etiology unclear, Exam otherwise benign, to check labs as documented, follow with expectant management  

## 2012-01-09 NOTE — Assessment & Plan Note (Signed)
For b12 today 

## 2012-01-09 NOTE — Patient Instructions (Addendum)
You had the flu shot, depoprovera, and B12 shots today Your medications were refilled today as requested to [facility] Please go to LAB in the Basement for the blood and/or urine tests to be done today You will be contacted by phone if any changes need to be made immediately.  Otherwise, you will receive a letter about your results with an explanation. Please remember to sign up for My Chart at your earliest convenience, as this will be important to you in the future with finding out test results. Please remember to followup with your GYN for the yearly pap smear and/or mammogram Please keep your appointments with your specialists as you have planned - [facility] Please continue your efforts at being more active, low cholesterol diet, and weight control. Please return in 1 year for your yearly visit, or sooner if needed

## 2012-01-09 NOTE — Assessment & Plan Note (Signed)
stable overall by hx and exam, most recent data reviewed with pt, and pt to continue medical treatment as before Lab Results  Component Value Date   HGBA1C 6.4 01/14/2011

## 2012-01-09 NOTE — Progress Notes (Signed)
Subjective:    Patient ID: Courtney [name], female    DOB: 1976-04-10, 36 y.o.   MRN: [id number]  HPI  Here for f/u;  Overall doing ok;  Pt denies CP, worsening SOB, DOE, wheezing, orthopnea, PND, worsening LE edema, palpitations, dizziness or syncope.  Pt denies neurological change such as new Headache.   Pt denies polydipsia, polyuria, or low sugar symptoms. Pt states overall good compliance with treatment and medications, good tolerability, and trying to follow lower cholesterol diet.  Pt denies worsening depressive symptoms, suicidal ideation or panic. No fever, wt loss, night sweats, loss of appetite, or other constitutional symptoms.  Pt states good ability with ADL's, low fall risk, home safety reviewed and adequate, no significant changes in hearing or vision, and occasionally active with exercise.  On new oral med for MS due to C4 demylination worsening noted on MRI may 2013, per [facility] neurology.  Recnet cbc/lefts aug 2013 normal.  Due for b12,/flu/depoprovera today.  Does have sense of ongoing fatigue, but denies signficant hypersomnolence.  Past Medical History  Diagnosis Date  . GERD (gastroesophageal reflux disease)   . Fibromyalgia   . Chronic fatigue   . CTS (carpal tunnel syndrome)   . Anemia, B12 deficiency   . Multiple sclerosis   . Hyperlipidemia   . C. difficile colitis   . Anxiety   . Heart murmur   . VITAMIN D DEFICIENCY 01/13/2010  . OBSTRUCTIVE SLEEP APNEA 12/08/2008  . Irritable bowel syndrome 05/24/2010  . HYPERLIPIDEMIA 03/13/2007  . ESSENTIAL HYPERTENSION 03/13/2007  . CARPAL TUNNEL SYNDROME, BILATERAL 01/13/2010  . ALLERGIC RHINITIS 03/13/2007  . Cervical lesion 07/13/2011    Spine lesion - indeterminate, for f/u MRI may 2013 per neurology   Past Surgical History  Procedure Date  . Carpal tunnel release 01/2010  . Colonoscopy w/ biopsies and polypectomy 05/2009    2 tubular adenomas    reports that she has never smoked. She has never used smokeless tobacco. She  reports that she does not drink alcohol or use illicit drugs. family history includes Diabetes in her other; Heart attack in her maternal grandfather; Hypertension in her other; Lung cancer in her maternal grandmother; Migraines in her mother; Prostate cancer in her maternal uncle; and Stroke in her maternal grandfather.  There is no history of Colon cancer. Allergies  Allergen Reactions  . Penicillins     REACTION: rash   Current Outpatient Prescriptions on File Prior to Visit  Medication Sig Dispense Refill  . cetirizine (ZYRTEC) 10 MG tablet Take 10 mg by mouth daily.        . Cyanocobalamin (YL VITAMIN B-12 CR) 1000 MCG TBCR Take by mouth. 1 injection every month       . medroxyPROGESTERone (DEPO-PROVERA) 150 MG/ML injection Inject 150 mg into the muscle every 3 (three) months.        . nortriptyline (PAMELOR) 50 MG capsule Take 1 capsule (50 mg total) by mouth 2 (two) times daily. At bedtime  180 capsule  0  . omeprazole-sodium bicarbonate (ZEGERID) 40-1100 MG per capsule Take 1 capsule by mouth daily.        . pregabalin (LYRICA) 75 MG capsule Take 1 capsule (75 mg total) by mouth 2 (two) times daily.  180 capsule  0  . DISCONTD: furosemide (LASIX) 40 MG tablet Take one tablet daily  90 tablet  3  . DISCONTD: metoprolol (LOPRESSOR) 100 MG tablet Take two tablets daily  180 tablet  3  . DISCONTD: potassium chloride (  K-DUR,KLOR-CON) 10 MEQ tablet Take one tablet daily  90 tablet  3  . DISCONTD: simvastatin (ZOCOR) 40 MG tablet Take one tablet daily  90 tablet  3   Current Facility-Administered Medications on File Prior to Visit  Medication Dose Route Frequency Provider Last Rate Last Dose  . methylPREDNISolone acetate (DEPO-MEDROL) injection 120 mg  120 mg Intramuscular Once Corwin Levins, MD       Review of Systems  Constitutional: Negative for diaphoresis and unexpected weight change.  HENT: Negative for tinnitus.   Eyes: Negative for photophobia and visual disturbance.  Respiratory:  Negative for choking and stridor.   Gastrointestinal: Negative for vomiting and blood in stool.  Genitourinary: Negative for hematuria and decreased urine volume.  Musculoskeletal: Negative for gait problem.  Skin: Negative for color change and wound.  Neurological: Negative for tremors and numbness.  Psychiatric/Behavioral: Negative for decreased concentration. The patient is not hyperactive.       Objective:   Physical Exam BP 100/62  Pulse 64  Temp 98.9 F (37.2 C) (Oral)  Ht 5\' 1"  (1.549 m)  Wt 204 lb 2 oz (92.59 kg)  BMI 38.57 kg/m2  SpO2 98% Physical Exam  VS noted Constitutional: Pt appears well-developed and well-nourished. Courtney [name] HENT: Head: Normocephalic.  Right Ear: External ear normal.  Left Ear: External ear normal.  Eyes: Conjunctivae and EOM are normal. Pupils are equal, round, and reactive to light.  Neck: Normal range of motion. Neck supple.  Cardiovascular: Normal rate and regular rhythm.   Pulmonary/Chest: Effort normal and breath sounds normal.  Abd:  Soft, NT, non-distended, + BS Neurological: Pt is alert. Not confused, o/w not done in detail  Skin: Skin is warm. No erythema.  Psychiatric: Pt behavior is normal. Thought content normal.     Assessment & Plan:

## 2012-01-09 NOTE — Assessment & Plan Note (Signed)
stable overall by hx and exam, most recent data reviewed with pt, and pt to continue medical treatment as before BP Readings from Last 3 Encounters:  01/09/12 100/62  07/13/11 112/78  01/14/11 110/72

## 2012-01-10 ENCOUNTER — Encounter: Payer: Self-pay | Admitting: Internal Medicine

## 2012-01-10 LAB — BASIC METABOLIC PANEL
BUN: 11 mg/dL (ref 6–23)
CO2: 28 mEq/L (ref 19–32)
Chloride: 104 mEq/L (ref 96–112)
Creatinine, Ser: 0.8 mg/dL (ref 0.4–1.2)

## 2012-01-10 LAB — LDL CHOLESTEROL, DIRECT: Direct LDL: 66.1 mg/dL

## 2012-01-10 LAB — LIPID PANEL
Cholesterol: 137 mg/dL (ref 0–200)
Total CHOL/HDL Ratio: 4

## 2012-01-16 DIAGNOSIS — G35 Multiple sclerosis: Secondary | ICD-10-CM | POA: Diagnosis not present

## 2012-01-16 DIAGNOSIS — R209 Unspecified disturbances of skin sensation: Secondary | ICD-10-CM | POA: Diagnosis not present

## 2012-01-21 DIAGNOSIS — G35 Multiple sclerosis: Secondary | ICD-10-CM | POA: Diagnosis not present

## 2012-02-08 ENCOUNTER — Ambulatory Visit (INDEPENDENT_AMBULATORY_CARE_PROVIDER_SITE_OTHER): Payer: Medicare Other

## 2012-02-08 DIAGNOSIS — E538 Deficiency of other specified B group vitamins: Secondary | ICD-10-CM

## 2012-02-08 MED ORDER — CYANOCOBALAMIN 1000 MCG/ML IJ SOLN
1000.0000 ug | Freq: Once | INTRAMUSCULAR | Status: AC
  Administered 2012-02-08: 1000 ug via INTRAMUSCULAR

## 2012-02-17 ENCOUNTER — Other Ambulatory Visit (INDEPENDENT_AMBULATORY_CARE_PROVIDER_SITE_OTHER): Payer: Medicare Other

## 2012-02-17 ENCOUNTER — Encounter: Payer: Self-pay | Admitting: Internal Medicine

## 2012-02-17 ENCOUNTER — Telehealth: Payer: Self-pay

## 2012-02-17 DIAGNOSIS — G35 Multiple sclerosis: Secondary | ICD-10-CM

## 2012-02-17 LAB — CBC WITH DIFFERENTIAL/PLATELET
Basophils Relative: 0.8 % (ref 0.0–3.0)
Eosinophils Absolute: 0.1 10*3/uL (ref 0.0–0.7)
Eosinophils Relative: 1.5 % (ref 0.0–5.0)
Hemoglobin: 12.4 g/dL (ref 12.0–15.0)
Lymphocytes Relative: 21.4 % (ref 12.0–46.0)
MCHC: 33.1 g/dL (ref 30.0–36.0)
MCV: 84.5 fl (ref 78.0–100.0)
Neutro Abs: 4.6 10*3/uL (ref 1.4–7.7)
RBC: 4.43 Mil/uL (ref 3.87–5.11)
WBC: 6.5 10*3/uL (ref 4.5–10.5)

## 2012-02-17 LAB — HEPATIC FUNCTION PANEL
ALT: 18 U/L (ref 0–35)
Albumin: 4 g/dL (ref 3.5–5.2)
Alkaline Phosphatase: 66 U/L (ref 39–117)
Total Protein: 7.3 g/dL (ref 6.0–8.3)

## 2012-02-17 NOTE — Telephone Encounter (Signed)
Put labs in

## 2012-02-22 DIAGNOSIS — G35 Multiple sclerosis: Secondary | ICD-10-CM | POA: Diagnosis not present

## 2012-03-09 ENCOUNTER — Ambulatory Visit (INDEPENDENT_AMBULATORY_CARE_PROVIDER_SITE_OTHER): Payer: Medicare Other | Admitting: Internal Medicine

## 2012-03-09 DIAGNOSIS — E538 Deficiency of other specified B group vitamins: Secondary | ICD-10-CM | POA: Diagnosis not present

## 2012-03-09 MED ORDER — CYANOCOBALAMIN 1000 MCG/ML IJ SOLN
1000.0000 ug | Freq: Once | INTRAMUSCULAR | Status: AC
  Administered 2012-03-09: 1000 ug via INTRAMUSCULAR

## 2012-04-06 DIAGNOSIS — G5600 Carpal tunnel syndrome, unspecified upper limb: Secondary | ICD-10-CM | POA: Diagnosis not present

## 2012-04-06 DIAGNOSIS — M79609 Pain in unspecified limb: Secondary | ICD-10-CM | POA: Diagnosis not present

## 2012-04-09 ENCOUNTER — Ambulatory Visit (INDEPENDENT_AMBULATORY_CARE_PROVIDER_SITE_OTHER): Payer: Medicare Other | Admitting: *Deleted

## 2012-04-09 DIAGNOSIS — Z309 Encounter for contraceptive management, unspecified: Secondary | ICD-10-CM | POA: Diagnosis not present

## 2012-04-09 DIAGNOSIS — Z0000 Encounter for general adult medical examination without abnormal findings: Secondary | ICD-10-CM

## 2012-04-09 DIAGNOSIS — E538 Deficiency of other specified B group vitamins: Secondary | ICD-10-CM

## 2012-04-09 MED ORDER — CYANOCOBALAMIN 1000 MCG/ML IJ SOLN
1000.0000 ug | Freq: Once | INTRAMUSCULAR | Status: AC
  Administered 2012-04-09: 1000 ug via INTRAMUSCULAR

## 2012-04-09 MED ORDER — MEDROXYPROGESTERONE ACETATE 150 MG/ML IM SUSP
150.0000 mg | Freq: Once | INTRAMUSCULAR | Status: AC
  Administered 2012-04-09: 150 mg via INTRAMUSCULAR

## 2012-05-09 ENCOUNTER — Ambulatory Visit: Payer: Medicare Other

## 2012-05-10 ENCOUNTER — Ambulatory Visit: Payer: Medicare Other

## 2012-05-23 DIAGNOSIS — G35 Multiple sclerosis: Secondary | ICD-10-CM | POA: Diagnosis not present

## 2012-06-08 ENCOUNTER — Ambulatory Visit (INDEPENDENT_AMBULATORY_CARE_PROVIDER_SITE_OTHER): Payer: Medicare Other | Admitting: Internal Medicine

## 2012-06-08 ENCOUNTER — Encounter: Payer: Self-pay | Admitting: Internal Medicine

## 2012-06-08 VITALS — BP 124/82 | HR 77 | Temp 98.1°F | Ht 60.0 in | Wt 215.0 lb

## 2012-06-08 DIAGNOSIS — J019 Acute sinusitis, unspecified: Secondary | ICD-10-CM

## 2012-06-08 DIAGNOSIS — E538 Deficiency of other specified B group vitamins: Secondary | ICD-10-CM

## 2012-06-08 MED ORDER — AZITHROMYCIN 250 MG PO TABS: ORAL_TABLET | ORAL | Status: DC

## 2012-06-08 MED ORDER — CYANOCOBALAMIN 1000 MCG/ML IJ SOLN
1000.0000 ug | Freq: Once | INTRAMUSCULAR | Status: AC
  Administered 2012-06-08: 1000 ug via INTRAMUSCULAR

## 2012-06-08 MED ORDER — KETOROLAC TROMETHAMINE 60 MG/2ML IM SOLN
30.0000 mg | Freq: Once | INTRAMUSCULAR | Status: AC
  Administered 2012-06-08: 30 mg via INTRAMUSCULAR

## 2012-06-08 MED ORDER — FLUTICASONE PROPIONATE 50 MCG/ACT NA SUSP: 2.0000 | Freq: Every day | NASAL | Status: DC

## 2012-06-08 NOTE — Patient Instructions (Signed)

## 2012-06-08 NOTE — Progress Notes (Signed)
HPI  Pt presents to the clinic today with 2 week history of headache, fatigue and nasal congestion. She has been taking Zyrtec daily. She does have a bad problem with allergies. She has tried Sudafed and Mucinex without much relief. The symptoms seem to be getting worse. She has had sick contacts.  Review of Systems    Past Medical History  Diagnosis Date  . GERD (gastroesophageal reflux disease)   . Fibromyalgia   . Chronic fatigue   . CTS (carpal tunnel syndrome)   . Anemia, B12 deficiency   . Multiple sclerosis   . Hyperlipidemia   . C. difficile colitis   . Anxiety   . Heart murmur   . VITAMIN D DEFICIENCY 01/13/2010  . OBSTRUCTIVE SLEEP APNEA 12/08/2008  . Irritable bowel syndrome 05/24/2010  . HYPERLIPIDEMIA 03/13/2007  . ESSENTIAL HYPERTENSION 03/13/2007  . CARPAL TUNNEL SYNDROME, BILATERAL 01/13/2010  . ALLERGIC RHINITIS 03/13/2007  . Cervical lesion 07/13/2011    Spine lesion - indeterminate, for f/u MRI may 2013 per neurology    Family History  Problem Relation Age of Onset  . Migraines Mother   . Prostate cancer Maternal Uncle     prostate cancer  . Lung cancer Maternal Grandmother   . Stroke Maternal Grandfather   . Heart attack Maternal Grandfather   . Diabetes Other   . Hypertension Other   . Colon cancer Neg Hx     History   Social History  . Marital Status: Married    Spouse Name: N/A    Number of Children: 2  . Years of Education: N/A   Occupational History  . 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 Smoking status: Never Smoker   . Smokeless tobacco: Never Used  . Alcohol Use: No  . Drug Use: No  . Sexually Active: Not on file   Other Topics Concern  . Not on file   Social History Narrative   Daily Caffeine    Allergies  Allergen Reactions  . Penicillins     REACTION: rash     Constitutional: Positive headache, fatigue. Denies fever or abrupt weight changes.  HEENT:  Positive eye pain, pressure behind the eyes,  facial pain, nasal congestion and sore throat. Denies eye redness, ear pain, ringing in the ears, wax buildup, runny nose or bloody nose. Respiratory:  Denies cough, sputum production, difficulty breathing or shortness of breath.  Cardiovascular: Denies chest pain, chest tightness, palpitations or swelling in the hands or feet.   No other specific complaints in a complete review of systems (except as listed in HPI above).  Objective:    BP 124/82  Pulse 77  Temp(Src) 98.1 F (36.7 C) (Oral)  Ht 5' (1.524 m)  Wt 215 lb (97.523 kg)  BMI 41.99 kg/m2  SpO2 96% Wt Readings from Last 3 Encounters:  06/08/12 215 lb (97.523 kg)  01/09/12 204 lb 2 oz (92.59 kg)  07/13/11 215 lb 8 oz (97.75 kg)    General: Appears her stated age, well developed, well nourished in NAD. HEENT: Head: normal shape and size, sinuses tender to palpation; Eyes: sclera white, no icterus, conjunctiva pink, PERRLA and EOMs intact; Ears: Tm's gray and intact, normal light reflex; Nose: mucosa pink and moist, septum midline; Throat/Mouth: + PND. Teeth present, mucosa pink and moist, no exudate noted, no lesions or ulcerations noted.  Neck: Mild cervical lymphadenopathy. Neck supple, trachea midline. No massses, lumps or thyromegaly present.  Cardiovascular: Normal rate and rhythm.  S1,S2 noted.  No murmur, rubs or gallops noted. No JVD or BLE edema. No carotid bruits noted. Pulmonary/Chest: Normal effort and positive vesicular breath sounds. No respiratory distress. No wheezes, rales or ronchi noted.      Assessment & Plan:   Acute bacterial sinusitis  Can use a Neti Pot which can be purchased from your local drug store. Flonase 2 sprays each nostril for 3 days and then as needed. Azithromax x 5 days  RTC as needed or if symptoms persist.

## 2012-06-08 NOTE — Progress Notes (Deleted)
  Subjective:    Patient ID: Courtney [name], female    DOB: May 14, 1975, 37 y.o.   MRN: [id number]  HPI  Pt presents to the clinic today with c/o headache.   Review of Systems     Objective:   Physical Exam        Assessment & Plan:

## 2012-06-08 NOTE — Addendum Note (Signed)
Addended by: Brenton Grills C on: 06/08/2012 11:42 AM   Modules accepted: Orders

## 2012-06-11 ENCOUNTER — Ambulatory Visit: Payer: Medicare Other

## 2012-07-09 ENCOUNTER — Ambulatory Visit (INDEPENDENT_AMBULATORY_CARE_PROVIDER_SITE_OTHER): Payer: Medicare Other

## 2012-07-09 DIAGNOSIS — Z309 Encounter for contraceptive management, unspecified: Secondary | ICD-10-CM

## 2012-07-09 DIAGNOSIS — E538 Deficiency of other specified B group vitamins: Secondary | ICD-10-CM

## 2012-07-09 MED ORDER — MEDROXYPROGESTERONE ACETATE 150 MG/ML IM SUSP
150.0000 mg | Freq: Once | INTRAMUSCULAR | Status: AC
  Administered 2012-07-09: 150 mg via INTRAMUSCULAR

## 2012-07-09 MED ORDER — CYANOCOBALAMIN 1000 MCG/ML IJ SOLN
1000.0000 ug | Freq: Once | INTRAMUSCULAR | Status: AC
  Administered 2012-07-09: 1000 ug via INTRAMUSCULAR

## 2012-07-23 ENCOUNTER — Encounter (HOSPITAL_COMMUNITY)
Admission: EM | Disposition: A | Discharge status: Home / Self Care | Payer: Self-pay | Source: Home / Self Care | Attending: Emergency Medicine

## 2012-07-23 ENCOUNTER — Observation Stay (HOSPITAL_COMMUNITY)
Admission: EM | Admit: 2012-07-23 | Discharge: 2012-07-24 | Disposition: A | Discharge status: Home / Self Care | Payer: Medicare Other | Attending: General Surgery | Admitting: General Surgery

## 2012-07-23 ENCOUNTER — Encounter (HOSPITAL_COMMUNITY): Payer: Self-pay | Admitting: Certified Registered Nurse Anesthetist

## 2012-07-23 ENCOUNTER — Emergency Department (HOSPITAL_COMMUNITY): Payer: Medicare Other | Admitting: Certified Registered Nurse Anesthetist

## 2012-07-23 ENCOUNTER — Ambulatory Visit (INDEPENDENT_AMBULATORY_CARE_PROVIDER_SITE_OTHER): Payer: Medicare Other | Admitting: Internal Medicine

## 2012-07-23 ENCOUNTER — Encounter: Payer: Self-pay | Admitting: Internal Medicine

## 2012-07-23 ENCOUNTER — Ambulatory Visit (INDEPENDENT_AMBULATORY_CARE_PROVIDER_SITE_OTHER)
Admission: RE | Admit: 2012-07-23 | Discharge: 2012-07-23 | Disposition: A | Discharge status: Home / Self Care | Payer: Medicare Other | Source: Ambulatory Visit | Attending: Internal Medicine | Admitting: Internal Medicine

## 2012-07-23 ENCOUNTER — Encounter (HOSPITAL_COMMUNITY): Payer: Self-pay | Admitting: Emergency Medicine

## 2012-07-23 ENCOUNTER — Inpatient Hospital Stay: Admit: 2012-07-23 | Payer: Self-pay | Admitting: General Surgery

## 2012-07-23 VITALS — BP 110/76 | HR 75 | Temp 98.6°F | Ht 60.0 in | Wt 209.8 lb

## 2012-07-23 DIAGNOSIS — K3580 Unspecified acute appendicitis: Secondary | ICD-10-CM | POA: Diagnosis not present

## 2012-07-23 DIAGNOSIS — Z79899 Other long term (current) drug therapy: Secondary | ICD-10-CM | POA: Diagnosis not present

## 2012-07-23 DIAGNOSIS — I10 Essential (primary) hypertension: Secondary | ICD-10-CM | POA: Diagnosis not present

## 2012-07-23 DIAGNOSIS — R1031 Right lower quadrant pain: Secondary | ICD-10-CM

## 2012-07-23 DIAGNOSIS — K37 Unspecified appendicitis: Secondary | ICD-10-CM

## 2012-07-23 DIAGNOSIS — R000 Tachycardia, unspecified: Secondary | ICD-10-CM | POA: Insufficient documentation

## 2012-07-23 DIAGNOSIS — G35 Multiple sclerosis: Secondary | ICD-10-CM | POA: Insufficient documentation

## 2012-07-23 DIAGNOSIS — K3533 Acute appendicitis with perforation and localized peritonitis, with abscess: Secondary | ICD-10-CM | POA: Diagnosis not present

## 2012-07-23 HISTORY — PX: APPENDECTOMY: SHX54

## 2012-07-23 HISTORY — PX: LAPAROSCOPIC APPENDECTOMY: SHX408

## 2012-07-23 LAB — COMPREHENSIVE METABOLIC PANEL WITH GFR
Alkaline Phosphatase: 89 U/L (ref 39–117)
BUN: 8 mg/dL (ref 6–23)
Calcium: 10 mg/dL (ref 8.4–10.5)
GFR calc Af Amer: >90 mL/min (ref >90)
Glucose, Bld: 109 mg/dL — ABNORMAL HIGH (ref 70–99)
Total Protein: 7.8 g/dL (ref 6.0–8.3)

## 2012-07-23 LAB — POCT URINALYSIS DIPSTICK
Bilirubin, UA: NEGATIVE
Blood, UA: NEGATIVE
Ketones, UA: NEGATIVE
Leukocytes, UA: NEGATIVE
Nitrite, UA: NEGATIVE
Protein, UA: NEGATIVE
pH, UA: 6

## 2012-07-23 LAB — CBC WITH DIFFERENTIAL/PLATELET
Basophils Absolute: 0 10*3/uL (ref 0.0–0.1)
Basophils Relative: 0 % (ref 0–1)
Eosinophils Absolute: 0.1 K/uL (ref 0.0–0.7)
Eosinophils Relative: 1 % (ref 0–5)
HCT: 38.4 % (ref 36.0–46.0)
Hemoglobin: 13.2 g/dL (ref 12.0–15.0)
Lymphocytes Relative: 20 % (ref 12–46)
Lymphs Abs: 2 K/uL (ref 0.7–4.0)
MCH: 28.8 pg (ref 26.0–34.0)
MCHC: 34.4 g/dL (ref 30.0–36.0)
MCV: 83.8 fL (ref 78.0–100.0)
Monocytes Absolute: 0.6 K/uL (ref 0.1–1.0)
Monocytes Relative: 5 % (ref 3–12)
Neutro Abs: 7.7 10*3/uL (ref 1.7–7.7)
Neutrophils Relative %: 74 % (ref 43–77)
Platelets: 273 10*3/uL (ref 150–400)
RBC: 4.58 MIL/uL (ref 3.87–5.11)
RDW: 14.7 % (ref 11.5–15.5)
WBC: 10.4 10*3/uL (ref 4.0–10.5)

## 2012-07-23 LAB — COMPREHENSIVE METABOLIC PANEL
ALT: 18 U/L (ref 0–35)
AST: 18 U/L (ref 0–37)
Albumin: 4.1 g/dL (ref 3.5–5.2)
CO2: 24 mEq/L (ref 19–32)
Chloride: 99 mEq/L (ref 96–112)
Creatinine, Ser: 0.86 mg/dL (ref 0.50–1.10)
GFR calc non Af Amer: 86 mL/min — ABNORMAL LOW (ref >90)
Potassium: 3.8 mEq/L (ref 3.5–5.1)
Sodium: 136 mEq/L (ref 135–145)
Total Bilirubin: 0.8 mg/dL (ref 0.3–1.2)

## 2012-07-23 SURGERY — APPENDECTOMY, LAPAROSCOPIC
Anesthesia: General | Site: Abdomen | Wound class: Contaminated

## 2012-07-23 MED ORDER — ONDANSETRON HCL 4 MG/2ML IJ SOLN: 4.0000 mg | Freq: Four times a day (QID) | INTRAMUSCULAR | Status: DC | PRN

## 2012-07-23 MED ORDER — ONDANSETRON HCL 4 MG/2ML IJ SOLN
INTRAMUSCULAR | Status: DC | PRN
  Administered 2012-07-23: 4 mg via INTRAVENOUS

## 2012-07-23 MED ORDER — FENTANYL CITRATE 0.05 MG/ML IJ SOLN
INTRAMUSCULAR | Status: DC | PRN
  Administered 2012-07-23: 50 ug via INTRAVENOUS
  Administered 2012-07-23: 100 ug via INTRAVENOUS

## 2012-07-23 MED ORDER — IOHEXOL 300 MG/ML  SOLN
100.0000 mL | Freq: Once | INTRAMUSCULAR | Status: AC | PRN
  Administered 2012-07-23: 100 mL via INTRAVENOUS

## 2012-07-23 MED ORDER — NEOSTIGMINE METHYLSULFATE 1 MG/ML IJ SOLN
INTRAMUSCULAR | Status: DC | PRN
  Administered 2012-07-23: 4 mg via INTRAVENOUS
  Administered 2012-07-23: 1 mg via INTRAVENOUS

## 2012-07-23 MED ORDER — DEXTROSE 5 % IV SOLN
2.0000 g | Freq: Once | INTRAVENOUS | Status: AC
  Administered 2012-07-23: 2 g via INTRAVENOUS
  Filled 2012-07-23: qty 2

## 2012-07-23 MED ORDER — SODIUM CHLORIDE 0.9 % IV SOLN
Freq: Once | INTRAVENOUS | Status: AC
  Administered 2012-07-23: 18:00:00 via INTRAVENOUS

## 2012-07-23 MED ORDER — ROCURONIUM BROMIDE 100 MG/10ML IV SOLN
INTRAVENOUS | Status: DC | PRN
  Administered 2012-07-23: 40 mg via INTRAVENOUS

## 2012-07-23 MED ORDER — SUCCINYLCHOLINE CHLORIDE 20 MG/ML IJ SOLN
INTRAMUSCULAR | Status: DC | PRN
  Administered 2012-07-23: 100 mg via INTRAVENOUS

## 2012-07-23 MED ORDER — DIPHENHYDRAMINE HCL 50 MG/ML IJ SOLN
12.5000 mg | Freq: Once | INTRAMUSCULAR | Status: AC
  Administered 2012-07-23: 12.5 mg via INTRAVENOUS
  Filled 2012-07-23: qty 1

## 2012-07-23 MED ORDER — ACETAMINOPHEN 10 MG/ML IV SOLN
1000.0000 mg | Freq: Once | INTRAVENOUS | Status: AC
  Administered 2012-07-23: 1000 mg via INTRAVENOUS

## 2012-07-23 MED ORDER — PROPOFOL 10 MG/ML IV BOLUS
INTRAVENOUS | Status: DC | PRN
  Administered 2012-07-23: 200 mg via INTRAVENOUS

## 2012-07-23 MED ORDER — LACTATED RINGERS IV SOLN
INTRAVENOUS | Status: DC | PRN
  Administered 2012-07-23: 22:00:00 via INTRAVENOUS

## 2012-07-23 MED ORDER — OXYCODONE HCL 5 MG PO TABS: 5.0000 mg | ORAL_TABLET | Freq: Once | ORAL | Status: AC | PRN

## 2012-07-23 MED ORDER — DEXAMETHASONE SODIUM PHOSPHATE 4 MG/ML IJ SOLN
INTRAMUSCULAR | Status: DC | PRN
  Administered 2012-07-23: 4 mg via INTRAVENOUS

## 2012-07-23 MED ORDER — HYDROMORPHONE HCL PF 1 MG/ML IJ SOLN: 0.2500 mg | INTRAMUSCULAR | Status: DC | PRN

## 2012-07-23 MED ORDER — KCL IN DEXTROSE-NACL 20-5-0.45 MEQ/L-%-% IV SOLN
INTRAVENOUS | Status: DC
  Administered 2012-07-24: 02:00:00 via INTRAVENOUS
  Filled 2012-07-23 (×3): qty 1000

## 2012-07-23 MED ORDER — GLYCOPYRROLATE 0.2 MG/ML IJ SOLN
INTRAMUSCULAR | Status: DC | PRN
  Administered 2012-07-23: 0.6 mg via INTRAVENOUS
  Administered 2012-07-23: 0.2 mg via INTRAVENOUS

## 2012-07-23 MED ORDER — METRONIDAZOLE IN NACL 5-0.79 MG/ML-% IV SOLN
500.0000 mg | Freq: Once | INTRAVENOUS | Status: AC
  Administered 2012-07-23: 500 mg via INTRAVENOUS
  Filled 2012-07-23: qty 100

## 2012-07-23 MED ORDER — METRONIDAZOLE IN NACL 5-0.79 MG/ML-% IV SOLN
500.0000 mg | Freq: Three times a day (TID) | INTRAVENOUS | Status: DC
  Administered 2012-07-24: 500 mg via INTRAVENOUS
  Filled 2012-07-23 (×2): qty 100

## 2012-07-23 MED ORDER — OXYCODONE HCL 5 MG/5ML PO SOLN: 5.0000 mg | Freq: Once | ORAL | Status: AC | PRN

## 2012-07-23 MED ORDER — LIDOCAINE HCL (CARDIAC) 20 MG/ML IV SOLN
INTRAVENOUS | Status: DC | PRN
  Administered 2012-07-23: 90 mg via INTRAVENOUS

## 2012-07-23 MED ORDER — BUPIVACAINE-EPINEPHRINE 0.25% -1:200000 IJ SOLN
INTRAMUSCULAR | Status: DC | PRN
  Administered 2012-07-23: 20 mL

## 2012-07-23 MED ORDER — SODIUM CHLORIDE 0.9 % IR SOLN
Status: DC | PRN
  Administered 2012-07-23: 1

## 2012-07-23 MED ORDER — CIPROFLOXACIN IN D5W 400 MG/200ML IV SOLN
400.0000 mg | Freq: Once | INTRAVENOUS | Status: AC
  Administered 2012-07-23: 400 mg via INTRAVENOUS
  Filled 2012-07-23: qty 200

## 2012-07-23 MED ORDER — MIDAZOLAM HCL 5 MG/5ML IJ SOLN
INTRAMUSCULAR | Status: DC | PRN
  Administered 2012-07-23 (×2): 2 mg via INTRAVENOUS

## 2012-07-23 SURGICAL SUPPLY — 49 items
ADH SKN CLS APL DERMABOND .7 (GAUZE/BANDAGES/DRESSINGS) ×1
APL SKNCLS STERI-STRIP NONHPOA (GAUZE/BANDAGES/DRESSINGS)
APPLIER CLIP ROT 10 11.4 M/L (STAPLE)
APR CLP MED LRG 11.4X10 (STAPLE)
BAG SPEC RTRVL LRG 6X4 10 (ENDOMECHANICALS) ×1
BENZOIN TINCTURE PRP APPL 2/3 (GAUZE/BANDAGES/DRESSINGS) IMPLANT
BLADE SURG ROTATE 9660 (MISCELLANEOUS) IMPLANT
CANISTER SUCTION 2500CC (MISCELLANEOUS) ×2 IMPLANT
CHLORAPREP W/TINT 26ML (MISCELLANEOUS) ×2 IMPLANT
CLIP APPLIE ROT 10 11.4 M/L (STAPLE) IMPLANT
CLOTH BEACON ORANGE TIMEOUT ST (SAFETY) ×2 IMPLANT
COVER SURGICAL LIGHT HANDLE (MISCELLANEOUS) ×2 IMPLANT
CUTTER FLEX LINEAR 45M (STAPLE) ×2 IMPLANT
DECANTER SPIKE VIAL GLASS SM (MISCELLANEOUS) ×2 IMPLANT
DERMABOND ADVANCED (GAUZE/BANDAGES/DRESSINGS) ×1
DERMABOND ADVANCED .7 DNX12 (GAUZE/BANDAGES/DRESSINGS) IMPLANT
DRAPE UTILITY 15X26 W/TAPE STR (DRAPE) ×4 IMPLANT
ELECT REM PT RETURN 9FT ADLT (ELECTROSURGICAL) ×2
ELECTRODE REM PT RTRN 9FT ADLT (ELECTROSURGICAL) ×1 IMPLANT
ENDOLOOP SUT PDS II  0 18 (SUTURE)
ENDOLOOP SUT PDS II 0 18 (SUTURE) IMPLANT
GAUZE SPONGE 2X2 8PLY STRL LF (GAUZE/BANDAGES/DRESSINGS) IMPLANT
GLOVE BIOGEL M STRL SZ7.5 (GLOVE) ×2 IMPLANT
GLOVE BIOGEL PI IND STRL 8 (GLOVE) ×1 IMPLANT
GLOVE BIOGEL PI INDICATOR 8 (GLOVE) ×1
GOWN PREVENTION PLUS XLARGE (GOWN DISPOSABLE) ×2 IMPLANT
GOWN STRL NON-REIN LRG LVL3 (GOWN DISPOSABLE) ×2 IMPLANT
KIT BASIN OR (CUSTOM PROCEDURE TRAY) ×2 IMPLANT
KIT ROOM TURNOVER OR (KITS) ×2 IMPLANT
NS IRRIG 1000ML POUR BTL (IV SOLUTION) ×2 IMPLANT
PAD ARMBOARD 7.5X6 YLW CONV (MISCELLANEOUS) ×4 IMPLANT
POUCH SPECIMEN RETRIEVAL 10MM (ENDOMECHANICALS) ×2 IMPLANT
RELOAD 45 VASCULAR/THIN (ENDOMECHANICALS) ×2 IMPLANT
RELOAD STAPLE 45 2.5 WHT GRN (ENDOMECHANICALS) IMPLANT
RELOAD STAPLE 45 3.5 BLU ETS (ENDOMECHANICALS) IMPLANT
RELOAD STAPLE TA45 3.5 REG BLU (ENDOMECHANICALS) IMPLANT
SCALPEL HARMONIC ACE (MISCELLANEOUS) ×2 IMPLANT
SCISSORS LAP 5X35 DISP (ENDOMECHANICALS) IMPLANT
SET IRRIG TUBING LAPAROSCOPIC (IRRIGATION / IRRIGATOR) ×2 IMPLANT
SPECIMEN JAR SMALL (MISCELLANEOUS) ×2 IMPLANT
SPONGE GAUZE 2X2 STER 10/PKG (GAUZE/BANDAGES/DRESSINGS)
SUT MNCRL AB 4-0 PS2 18 (SUTURE) ×2 IMPLANT
SUT VICRYL 0 UR6 27IN ABS (SUTURE) IMPLANT
TOWEL OR 17X24 6PK STRL BLUE (TOWEL DISPOSABLE) ×2 IMPLANT
TOWEL OR 17X26 10 PK STRL BLUE (TOWEL DISPOSABLE) ×2 IMPLANT
TRAY FOLEY CATH 14FR (SET/KITS/TRAYS/PACK) ×2 IMPLANT
TRAY LAPAROSCOPIC (CUSTOM PROCEDURE TRAY) ×2 IMPLANT
TROCAR XCEL BLADELESS 5X75MML (TROCAR) ×4 IMPLANT
TROCAR XCEL BLUNT TIP 100MML (ENDOMECHANICALS) ×2 IMPLANT

## 2012-07-23 NOTE — H&P (Signed)
Courtney [name] is an 37 y.o. female.   Chief Complaint: abd pain  HPI: 37 year old Caucasian female with gastroesophageal reflux disease, fibromyalgia, multiple sclerosis, obstructive sleep apnea he developed right lower abdominal pain yesterday afternoon. The pain was initially sharp but then became more of a dull ache. The pain was constant however it did increase in intensity intermittently. She States the pain started to move more to her right flank this morning. She presented to her primary care physician's office where labs and a CT scan was ordered. Her CT scan showed acute appendicitis and she was transferred to the emergency department. She denies any prior symptoms. She denies any fever, chills, nausea, vomiting, diarrhea or constipation. Her last bowel movement was today which was normal. She denies any weight loss. She denies any prior abdominal surgery.  Past Medical History  Diagnosis Date  . GERD (gastroesophageal reflux disease)   . Fibromyalgia   . Chronic fatigue   . CTS (carpal tunnel syndrome)   . Anemia, B12 deficiency   . Multiple sclerosis   . Hyperlipidemia   . C. difficile colitis   . Anxiety   . Heart murmur   . VITAMIN D DEFICIENCY 01/13/2010  . OBSTRUCTIVE SLEEP APNEA 12/08/2008  . Irritable bowel syndrome 05/24/2010  . HYPERLIPIDEMIA 03/13/2007  . ESSENTIAL HYPERTENSION 03/13/2007  . CARPAL TUNNEL SYNDROME, BILATERAL 01/13/2010  . ALLERGIC RHINITIS 03/13/2007  . Cervical lesion 07/13/2011    Spine lesion - indeterminate, for f/u MRI may 2013 per neurology    Past Surgical History  Procedure Laterality Date  . Carpal tunnel release  01/2010  . Colonoscopy w/ biopsies and polypectomy  05/2009    2 tubular adenomas    Family History  Problem Relation Age of Onset  . Migraines Mother   . Prostate cancer Maternal Uncle     prostate cancer  . Lung cancer Maternal Grandmother   . Stroke Maternal Grandfather   . Heart attack Maternal Grandfather   . Diabetes  Other   . Hypertension Other   . Colon cancer Neg Hx    Social History:  reports that she has never smoked. She has never used smokeless tobacco. She reports that she does not drink alcohol or use illicit drugs.  Allergies:  Allergies  Allergen Reactions  . Ciprofloxacin In D5w Rash  . Penicillins     REACTION: rash     (Not in a hospital admission)  Results for orders placed during the hospital encounter of 07/23/12 (from the past 48 hour(s))  CBC WITH DIFFERENTIAL     Status: None   Collection Time    07/23/12  4:25 PM      Result Value Range   WBC 10.4  4.0 - 10.5 K/uL   RBC 4.58  3.87 - 5.11 MIL/uL   Hemoglobin 13.2  12.0 - 15.0 g/dL   HCT 16.1  09.6 - 04.5 %   MCV 83.8  78.0 - 100.0 fL   MCH 28.8  26.0 - 34.0 pg   MCHC 34.4  30.0 - 36.0 g/dL   RDW 40.9  81.1 - 91.4 %   Platelets 273  150 - 400 K/uL   Neutrophils Relative 74  43 - 77 %   Neutro Abs 7.7  1.7 - 7.7 K/uL   Lymphocytes Relative 20  12 - 46 %   Lymphs Abs 2.0  0.7 - 4.0 K/uL   Monocytes Relative 5  3 - 12 %   Monocytes Absolute 0.6  0.1 -  1.0 K/uL   Eosinophils Relative 1  0 - 5 %   Eosinophils Absolute 0.1  0.0 - 0.7 K/uL   Basophils Relative 0  0 - 1 %   Basophils Absolute 0.0  0.0 - 0.1 K/uL  COMPREHENSIVE METABOLIC PANEL     Status: Abnormal   Collection Time    07/23/12  4:25 PM      Result Value Range   Sodium 136  135 - 145 mEq/L   Potassium 3.8  3.5 - 5.1 mEq/L   Chloride 99  96 - 112 mEq/L   CO2 24  19 - 32 mEq/L   Glucose, Bld 109 (*) 70 - 99 mg/dL   BUN 8  6 - 23 mg/dL   Creatinine, Ser 1.61  0.50 - 1.10 mg/dL   Calcium 09.6  8.4 - 04.5 mg/dL   Total Protein 7.8  6.0 - 8.3 g/dL   Albumin 4.1  3.5 - 5.2 g/dL   AST 18  0 - 37 U/L   ALT 18  0 - 35 U/L   Alkaline Phosphatase 89  39 - 117 U/L   Total Bilirubin 0.8  0.3 - 1.2 mg/dL   GFR calc non Af Amer 86 (*) >90 mL/min   GFR calc Af Amer >90  >90 mL/min   Comment:            The eGFR has been calculated     using the CKD EPI  equation.     This calculation has not been     validated in all clinical     situations.     eGFR's persistently     <90 mL/min signify     possible Chronic Kidney Disease.  POCT PREGNANCY, URINE     Status: None   Collection Time    07/23/12  5:05 PM      Result Value Range   Preg Test, Ur NEGATIVE  NEGATIVE   Comment:            THE SENSITIVITY OF THIS     METHODOLOGY IS >24 mIU/mL   Ct Abdomen Pelvis W Contrast  07/23/2012  *RADIOLOGY REPORT*  Clinical Data: Right lower quadrant acute abdominal pain.  CT ABDOMEN AND PELVIS WITH CONTRAST  Technique:  Multidetector CT imaging of the abdomen and pelvis was performed following the standard protocol during bolus administration of intravenous contrast.  Contrast: OMNIPAQUE IOHEXOL 300 MG/ML  SOLN  Comparison: Ultrasound 04/22/2009  Findings: There is mild scarring at the lung bases.  No active cardiopulmonary process evident.  The liver shows mild fatty change.  No evidence of mass or ductal dilatation.  No calcified gallstones.  The spleen is normal.  The pancreas is normal.  The adrenal glands are normal.  The right kidney is normal.  The left kidney is normal except for a 1 cm cyst in the upper pole.  The aorta and IVC are normal.  No retroperitoneal mass or adenopathy.  The appendix is abnormal showing a diameter of 11 mm with mild surrounding inflammation.  No evidence of abscess or rupture. There are a few regional lymph nodes are likely reactive.  There could be mild inflammation of the cecum as well.  Small bowel pattern is normal.  Distal colon is normal.  Uterus and adnexal regions are normal.  No significant bony finding.  IMPRESSION: Acute uncomplicated appendicitis.  Mild regional fat inflammation. Possible inflammation of the cecum.  Fatty liver.   Original Report Authenticated By: Paulina Fusi,  M.D.     Review of Systems  Constitutional: Negative for fever and chills.  HENT: Negative for hearing loss and nosebleeds.   Eyes:  Negative for blurred vision and double vision.  Respiratory: Negative for shortness of breath.   Cardiovascular: Negative for chest pain and leg swelling.  Gastrointestinal: Positive for abdominal pain. Negative for nausea and vomiting.  Genitourinary: Negative for dysuria and urgency.  Musculoskeletal: Negative for back pain and joint pain.  Neurological: Negative for dizziness, speech change, seizures, loss of consciousness, weakness and headaches.       Has some peripheral numbness in b/l ext from MS. No recent MS flare  Psychiatric/Behavioral: Negative for suicidal ideas and substance abuse.    Blood pressure 122/82, pulse 124, temperature 98.4 F (36.9 C), temperature source Oral, resp. rate 15, SpO2 100.00%. Physical Exam  Vitals reviewed. Constitutional: She appears well-developed and well-nourished. No distress.  HENT:  Head: Normocephalic and atraumatic.  Right Ear: External ear normal.  Left Ear: External ear normal.  Eyes: Conjunctivae are normal.  Neck: Neck supple. No tracheal deviation present. No thyromegaly present.  Cardiovascular: Regular rhythm, normal heart sounds and intact distal pulses.  Tachycardia present.   Respiratory: Effort normal. No respiratory distress. She has no wheezes.  GI: Soft. She exhibits no distension. There is tenderness in the right lower quadrant and suprapubic area. There is guarding and tenderness at McBurney's point. There is no rebound. No hernia.    obese  Lymphadenopathy:    She has no cervical adenopathy.  Skin: She is not diaphoretic.     Assessment/Plan Acute appendicitis HTN MS GERD Fibromyalgia  We discussed the etiology and management of acute appendicitis. We discussed operative and nonoperative management.  I recommended operative management along with IV antibiotics.  We discussed laparoscopic appendectomy. We discussed the risk and benefits of surgery including but not limited to bleeding, infection, injury to  surrounding structures, need to convert to an open procedure, blood clot formation, post operative abscess or wound infection, staple line complications such as leak or bleeding, hernia formation, post operative ileus, need for additional procedures, anesthesia complications, and the typical postoperative course. I explained that the patient should expect a good improvement in their symptoms. I explained that she is at increased risk for a MS flare.  All questions asked and answered.   Mary Sella. Andrey Campanile, MD, FACS General, Bariatric, & Minimally Invasive Surgery [facility], [place]    [facility] M 07/23/2012, 8:58 PM

## 2012-07-23 NOTE — ED Provider Notes (Addendum)
History     CSN: [id number]  Arrival date & time 07/23/12  1619   First MD Initiated Contact with Patient 07/23/12 1726      Chief Complaint  Patient presents with  . Abdominal Pain    (Consider location/radiation/quality/duration/timing/severity/associated sxs/prior treatment) HPI Comments: Patient reports that she had a sharp sudden right lower quadrant pain that began yesterday. She denies any significant nausea or vomiting, changes in urination. She has significant history of hypertension, hyperlipidemia, MS. She is followed by neurology at [facility]. She saw her nurse practitioner with Dr. Raphael Gibney office today for her abdominal pain and was referred to radiology to obtain a CT scan with contrast. Apparently the results showed that she had acute appendicitis and was told to present herself to the emergency department here. She does not know if her doctor contacted general surgery or not. At this moment in time, she does not request any pain medicine or nausea medicine. Other than drinking the contrast, she has not eaten or drank anything yet today.   Patient is a 37 y.o. female presenting with abdominal pain. The history is provided by the patient and medical records.  Abdominal Pain Associated symptoms: nausea   Associated symptoms: no chills and no fever     Past Medical History  Diagnosis Date  . GERD (gastroesophageal reflux disease)   . Fibromyalgia   . Chronic fatigue   . CTS (carpal tunnel syndrome)   . Anemia, B12 deficiency   . Multiple sclerosis   . Hyperlipidemia   . C. difficile colitis   . Anxiety   . Heart murmur   . VITAMIN D DEFICIENCY 01/13/2010  . OBSTRUCTIVE SLEEP APNEA 12/08/2008  . Irritable bowel syndrome 05/24/2010  . HYPERLIPIDEMIA 03/13/2007  . ESSENTIAL HYPERTENSION 03/13/2007  . CARPAL TUNNEL SYNDROME, BILATERAL 01/13/2010  . ALLERGIC RHINITIS 03/13/2007  . Cervical lesion 07/13/2011    Spine lesion - indeterminate, for f/u MRI may 2013 per  neurology    Past Surgical History  Procedure Laterality Date  . Carpal tunnel release  01/2010  . Colonoscopy w/ biopsies and polypectomy  05/2009    2 tubular adenomas    Family History  Problem Relation Age of Onset  . Migraines Mother   . Prostate cancer Maternal Uncle     prostate cancer  . Lung cancer Maternal Grandmother   . Stroke Maternal Grandfather   . Heart attack Maternal Grandfather   . Diabetes Other   . Hypertension Other   . Colon cancer Neg Hx     History  Substance Use Topics  . Smoking status: Never Smoker   . Smokeless tobacco: Never Used  . Alcohol Use: No    OB History   Grav Para Term Preterm Abortions TAB SAB Ect Mult Living                  Review of Systems  Constitutional: Positive for appetite change. Negative for fever and chills.  Gastrointestinal: Positive for nausea and abdominal pain.  Neurological: Negative for weakness.  All other systems reviewed and are negative.    Allergies  Penicillins  Home Medications   Current Outpatient Rx  Name  Route  Sig  Dispense  Refill  . cetirizine (ZYRTEC) 10 MG tablet   Oral   Take 10 mg by mouth daily.           . Cholecalciferol (VITAMIN D) 2000 UNITS CAPS   Oral   Take 1 capsule by mouth every morning.         Marland Kitchen  cyanocobalamin (,VITAMIN B-12,) 1000 MCG/ML injection   Intramuscular   Inject 1,000 mcg into the muscle every 30 (thirty) days.         . furosemide (LASIX) 40 MG tablet   Oral   Take 40 mg by mouth daily. Take one tablet daily         . gabapentin (NEURONTIN) 300 MG capsule   Oral   Take 300 mg by mouth 3 (three) times daily.          Marland Kitchen loperamide (IMODIUM) 2 MG capsule   Oral   Take 4 mg by mouth 2 (two) times daily.          . medroxyPROGESTERone (DEPO-PROVERA) 150 MG/ML injection   Intramuscular   Inject 150 mg into the muscle every 3 (three) months.           . metoprolol (LOPRESSOR) 100 MG tablet   Oral   Take 100 mg by mouth 2 (two)  times daily.         . nortriptyline (PAMELOR) 50 MG capsule   Oral   Take 100 mg by mouth at bedtime. At bedtime         . omeprazole-sodium bicarbonate (ZEGERID) 40-1100 MG per capsule   Oral   Take 1 capsule by mouth daily.           . potassium chloride (K-DUR,KLOR-CON) 10 MEQ tablet   Oral   Take 10 mEq by mouth daily. Take one tablet daily         . simvastatin (ZOCOR) 40 MG tablet   Oral   Take 40 mg by mouth at bedtime. Take one tablet daily           BP 139/91  Pulse 115  Temp(Src) 98.4 F (36.9 C) (Oral)  Resp 20  SpO2 99%  Physical Exam  Nursing note and vitals reviewed. Constitutional: She appears well-developed and well-nourished.  HENT:  Head: Normocephalic and atraumatic.  Eyes: EOM are normal.  Neck: Neck supple.  Cardiovascular: Normal rate and regular rhythm.   Pulmonary/Chest: Effort normal. No respiratory distress.  Abdominal: Soft. There is tenderness. There is guarding. There is no rebound.  Neurological: She is alert. Coordination normal.  Skin: Skin is warm. No rash noted.  Psychiatric: She has a normal mood and affect.    ED Course  Procedures (including critical care time)  Labs Reviewed  COMPREHENSIVE METABOLIC PANEL - Abnormal; Notable for the following:    Glucose, Bld 109 (*)    GFR calc non Af Amer 86 (*)    All other components within normal limits  CBC WITH DIFFERENTIAL  POCT PREGNANCY, URINE   Ct Abdomen Pelvis W Contrast  07/23/2012  *RADIOLOGY REPORT*  Clinical Data: Right lower quadrant acute abdominal pain.  CT ABDOMEN AND PELVIS WITH CONTRAST  Technique:  Multidetector CT imaging of the abdomen and pelvis was performed following the standard protocol during bolus administration of intravenous contrast.  Contrast: OMNIPAQUE IOHEXOL 300 MG/ML  SOLN  Comparison: Ultrasound 04/22/2009  Findings: There is mild scarring at the lung bases.  No active cardiopulmonary process evident.  The liver shows mild fatty  change.  No evidence of mass or ductal dilatation.  No calcified gallstones.  The spleen is normal.  The pancreas is normal.  The adrenal glands are normal.  The right kidney is normal.  The left kidney is normal except for a 1 cm cyst in the upper pole.  The aorta and IVC are normal.  No retroperitoneal mass or adenopathy.  The appendix is abnormal showing a diameter of 11 mm with mild surrounding inflammation.  No evidence of abscess or rupture. There are a few regional lymph nodes are likely reactive.  There could be mild inflammation of the cecum as well.  Small bowel pattern is normal.  Distal colon is normal.  Uterus and adnexal regions are normal.  No significant bony finding.  IMPRESSION: Acute uncomplicated appendicitis.  Mild regional fat inflammation. Possible inflammation of the cecum.  Fatty liver.   Original Report Authenticated By: Paulina Fusi, M.D.      1. Appendicitis      8:30 PM Pt had reaction likely to IV cipro with rash.  Given benadryl and abx stopped.   MDM   Patient with one day of right lower quadrant abdominal pain, head CT scan performed as an outpatient after seeing her primary care providers. Patient has borderline elevated white cell count, tenderness and right lower quadrant on exam. Otherwise patient is comfortable. Plan is to keep her n.p.o., provide IV antibiotics and discuss with general surgery. I spoke with Dr. Andrey Campanile who will valuate the patient and plan for a war later tonight. Since she is allergic to penicillin, we agreed to give her Cipro and Flagyl instead. Patient currently does not request any pain medications.        Gavin Pound. Oletta Lamas, MD 07/23/12 1937  Gavin Pound. Oletta Lamas, MD 07/23/12 Ninfa Linden  Gavin Pound. Oletta Lamas, MD 07/23/12 2057

## 2012-07-23 NOTE — Transfer of Care (Signed)
Immediate Anesthesia Transfer of Care Note  Patient: Courtney [name] [facility]  Procedure(s) Performed: Procedure(s): APPENDECTOMY LAPAROSCOPIC (N/A)  Patient Location: PACU  Anesthesia Type:General  Level of Consciousness: awake and alert   Airway & Oxygen Therapy: Patient Spontanous Breathing and Patient connected to face mask oxygen  Post-op Assessment: Report given to PACU RN, Post -op Vital signs reviewed and stable and Patient moving all extremities  Post vital signs: Reviewed and stable  Complications: No apparent anesthesia complications

## 2012-07-23 NOTE — ED Notes (Signed)
Per EDP ghim, give 12.5mg  benadryl for allergic reaction

## 2012-07-23 NOTE — Preoperative (Signed)
Beta Blockers   Reason not to administer Beta Blockers:Not Applicable 

## 2012-07-23 NOTE — ED Notes (Signed)
Pt to OR.

## 2012-07-23 NOTE — Op Note (Signed)
Appendectomy, Lap, Procedure Note  Indications: The patient presented with a history of right-sided abdominal pain. A CT revealed findings consistent with acute appendicitis.  Pre-operative Diagnosis: Acute appendicitis without mention of peritonitis  Post-operative Diagnosis: Same  Surgeon: Atilano Ina   Assistants: none  Anesthesia: General endotracheal anesthesia  ASA Class: 2  Procedure Details  The patient was seen again in the Holding Room. The risks, benefits, complications, treatment options, and expected outcomes were discussed with the patient and/or family. The possibilities of perforation of viscus, bleeding, recurrent infection, the need for additional procedures, failure to diagnose a condition, and creating a complication requiring transfusion or operation were discussed. There was concurrence with the proposed plan and informed consent was obtained. The site of surgery was properly noted. The patient was taken to Operating Room, identified as Courtney [name] [facility] and the procedure verified as Appendectomy. A Time Out was held and the above information confirmed. IV antibiotics were administered  The patient was placed in the supine position and general anesthesia was induced, along with placement of orogastric tube, SCDs, and a Foley catheter. The abdomen was prepped and draped in a sterile fashion. A 1.5 centimeter infraumbilical incision was made.  The umbilical stalk was elevated, and the midline fascia was incised with a #11 blade.  A Kelly clamp was used to confirm entrance into the peritoneal cavity.  A pursestring suture was passed around the incision with a 0 Vicryl.  A 12mm Hasson was introduced into the abdomen and the tails of the suture were used to hold the Hasson in place.   The pneumoperitoneum was then established to steady pressure of 15 mmHg.  Additional 5 mm cannulas then placed in the left lower quadrant of the abdomen and the suprapubic region under direct  visualization. A careful evaluation of the entire abdomen was carried out. The patient was placed in Trendelenburg and left lateral decubitus position. The small intestines were retracted in the cephalad and left lateral direction away from the pelvis and right lower quadrant. The patient was found to have an Inflamed appendix that was extending into the pelvis. There was No evidence of perforation.  The appendix was carefully dissected. The appendix was was skeletonized with the harmonic scalpel.   The appendix was divided at its base using an endo-GIA stapler with a white load. No appendiceal stump was left in place. The appendix was removed from the abdomen with an Endocatch bag through the umbilical port.  There was no evidence of bleeding, leakage, or complication after division of the appendix. Irrigation was also performed and irrigate suctioned from the abdomen as well.  The umbilical port site was closed with the purse string suture. The closure felt a liitle weak so therefore an additional figure of eight 0 Vicryl suture was placed. The closure was viewed laparoscopically. There was no residual palpable fascial defect.  The trocar site skin wounds were closed with 4-0 Monocryl. Dermabond was applied to the skin incisions.  Instrument, sponge, and needle counts were correct at the conclusion of the case.   Findings: The appendix was found to be inflamed. There were not signs of necrosis.  There was not perforation. There was not abscess formation.  Estimated Blood Loss:  Minimal         Drains: None         Specimens: Appendix         Complications:  None; patient tolerated the procedure well.         Disposition:  PACU - hemodynamically stable.         Condition: stable  Courtney [name]. Andrey Campanile, MD, FACS General, Bariatric, & Minimally Invasive Surgery [facility], [place]

## 2012-07-23 NOTE — ED Notes (Signed)
Pt reports she is in pain, but denies pain medication at this time.

## 2012-07-23 NOTE — Patient Instructions (Signed)
Abdominal Pain (Nonspecific)  Your exam might not show the exact reason you have abdominal pain. Since there are many different causes of abdominal pain, another checkup and more tests may be needed. It is very important to follow up for lasting (persistent) or worsening symptoms. A possible cause of abdominal pain in any person who still has his or her appendix is acute appendicitis. Appendicitis is often hard to diagnose. Normal blood tests, urine tests, ultrasound, and CT scans do not completely rule out early appendicitis or other causes of abdominal pain. Sometimes, only the changes that happen over time will allow appendicitis and other causes of abdominal pain to be determined. Other potential problems that may require surgery may also take time to become more apparent. Because of this, it is important that you follow all of the instructions below.  HOME CARE INSTRUCTIONS    Rest as much as possible.   Do not eat solid food until your pain is gone.   While adults or children have pain: A diet of water, weak decaffeinated tea, broth or bouillon, gelatin, oral rehydration solutions (ORS), frozen ice pops, or ice chips may be helpful.   When pain is gone in adults or children: Start a light diet (dry toast, crackers, applesauce, or white rice). Increase the diet slowly as long as it does not bother you. Eat no dairy products (including cheese and eggs) and no spicy, fatty, fried, or high-fiber foods.   Use no alcohol, caffeine, or cigarettes.   Take your regular medicines unless your caregiver told you not to.   Take any prescribed medicine as directed.   Only take over-the-counter or prescription medicines for pain, discomfort, or fever as directed by your caregiver. Do not give aspirin to children.  If your caregiver has given you a follow-up appointment, it is very important to keep that appointment. Not keeping the appointment could result in a permanent injury and/or lasting (chronic) pain and/or  disability. If there is any problem keeping the appointment, you must call to reschedule.   SEEK IMMEDIATE MEDICAL CARE IF:    Your pain is not gone in 24 hours.   Your pain becomes worse, changes location, or feels different.   You or your child has an oral temperature above 102 F (38.9 C), not controlled by medicine.   Your baby is older than 3 months with a rectal temperature of 102 F (38.9 C) or higher.   Your baby is 3 months old or younger with a rectal temperature of 100.4 F (38 C) or higher.   You have shaking chills.   You keep throwing up (vomiting) or cannot drink liquids.   There is blood in your vomit or you see blood in your bowel movements.   Your bowel movements become dark or black.   You have frequent bowel movements.   Your bowel movements stop (become blocked) or you cannot pass gas.   You have bloody, frequent, or painful urination.   You have yellow discoloration in the skin or whites of the eyes.   Your stomach becomes bloated or bigger.   You have dizziness or fainting.   You have chest or back pain.  MAKE SURE YOU:    Understand these instructions.   Will watch your condition.   Will get help right away if you are not doing well or get worse.  Document Released: 04/04/2005 Document Revised: 06/27/2011 Document Reviewed: 03/02/2009  Exit[employer] Patient Information 2013 [employer]

## 2012-07-23 NOTE — Anesthesia Postprocedure Evaluation (Signed)
Anesthesia Post Note  Patient: Courtney [name]  Procedure(s) Performed: Procedure(s) (LRB): APPENDECTOMY LAPAROSCOPIC (N/A)  Anesthesia type: General  Patient location: PACU  Post pain: Pain level controlled and Adequate analgesia  Post assessment: Post-op Vital signs reviewed, Patient's Cardiovascular Status Stable, Respiratory Function Stable, Patent Airway and Pain level controlled  Last Vitals:  Filed Vitals:   07/23/12 2352  BP: 120/67  Pulse: 121  Temp:   Resp: 19    Post vital signs: Reviewed and stable  Level of consciousness: awake, alert  and oriented  Complications: No apparent anesthesia complications

## 2012-07-23 NOTE — Progress Notes (Signed)
Subjective:    Patient ID: Courtney [name], female    DOB: [birth year], 37 y.o.   MRN: [id number]  HPI  Pt presents to the clinic today with c/o severe right sided abdominal pain. This started last night. It has been constant. The pain is 8/10. She denies nausea, vomiting, diarrhea, fever or chills. She does not have regular periods because she uses depo injections for birth control. She has not missed an injection. She has no urinary complaints. She has never had pain like this before. There is no increase or decrease in the pain related to eating. She has not taken anything for the pain.  Review of Systems      Past Medical History  Diagnosis Date  . GERD (gastroesophageal reflux disease)   . Fibromyalgia   . Chronic fatigue   . CTS (carpal tunnel syndrome)   . Anemia, B12 deficiency   . Multiple sclerosis   . Hyperlipidemia   . C. difficile colitis   . Anxiety   . Heart murmur   . VITAMIN D DEFICIENCY 01/13/2010  . OBSTRUCTIVE SLEEP APNEA 12/08/2008  . Irritable bowel syndrome 05/24/2010  . HYPERLIPIDEMIA 03/13/2007  . ESSENTIAL HYPERTENSION 03/13/2007  . CARPAL TUNNEL SYNDROME, BILATERAL 01/13/2010  . ALLERGIC RHINITIS 03/13/2007  . Cervical lesion 07/13/2011    Spine lesion - indeterminate, for f/u MRI may 2013 per neurology    Current Outpatient Prescriptions  Medication Sig Dispense Refill  . cetirizine (ZYRTEC) 10 MG tablet Take 10 mg by mouth daily.        . Cholecalciferol (VITAMIN D) 2000 UNITS CAPS Take 1 capsule by mouth every morning.      . Cyanocobalamin (YL VITAMIN B-12 CR) 1000 MCG TBCR Take by mouth. 1 injection every month       . fluticasone (FLONASE) 50 MCG/ACT nasal spray Place 2 sprays into the nose daily.  16 g  6  . furosemide (LASIX) 40 MG tablet Take one tablet daily  90 tablet  3  . gabapentin (NEURONTIN) 300 MG capsule Take 300 mg by mouth 3 (three) times daily.       Marland Kitchen loperamide (IMODIUM) 2 MG capsule Take 2 mg by mouth 2 (two) times daily.      .  medroxyPROGESTERone (DEPO-PROVERA) 150 MG/ML injection Inject 150 mg into the muscle every 3 (three) months.        . metoprolol (LOPRESSOR) 100 MG tablet Take two tablets daily  180 tablet  3  . nortriptyline (PAMELOR) 50 MG capsule Take 1 capsule (50 mg total) by mouth 2 (two) times daily. At bedtime  180 capsule  0  . omeprazole-sodium bicarbonate (ZEGERID) 40-1100 MG per capsule Take 1 capsule by mouth daily.        . potassium chloride (K-DUR,KLOR-CON) 10 MEQ tablet Take one tablet daily  90 tablet  3  . simvastatin (ZOCOR) 40 MG tablet Take one tablet daily  90 tablet  3   Current Facility-Administered Medications  Medication Dose Route Frequency Provider Last Rate Last Dose  . methylPREDNISolone acetate (DEPO-MEDROL) injection 120 mg  120 mg Intramuscular Once Corwin Levins, MD        Allergies  Allergen Reactions  . Penicillins     REACTION: rash    Family History  Problem Relation Age of Onset  . Migraines Mother   . Prostate cancer Maternal Uncle     prostate cancer  . Lung cancer Maternal Grandmother   . Stroke Maternal Grandfather   . Heart  attack Maternal Grandfather   . Diabetes Other   . Hypertension Other   . Colon cancer Neg Hx     History   Social History  . Marital Status: Married    Spouse Name: N/A    Number of Children: 2  . Years of Education: N/A   Occupational History  . 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 Smoking status: Never Smoker   . Smokeless tobacco: Never Used  . Alcohol Use: No  . Drug Use: No  . Sexually Active: Not on file   Other Topics Concern  . Not on file   Social History Narrative   Daily Caffeine     Constitutional: Denies fever, malaise, fatigue, headache or abrupt weight changes.   Cardiovascular: Denies chest pain, chest tightness, palpitations or swelling in the hands or feet.  Gastrointestinal: Pt reports severe RLQ abdominal pain. Denies bloating, constipation, diarrhea or blood in the  stool.  GU: Denies urgency, frequency, pain with urination, burning sensation, blood in urine, odor or discharge.   No other specific complaints in a complete review of systems (except as listed in HPI above).  Objective:   Physical Exam  BP 110/76  Pulse 75  Temp(Src) 98.6 F (37 C) (Oral)  Ht 5' (1.524 m)  Wt 209 lb 12.8 oz (95.165 kg)  BMI 40.97 kg/m2  SpO2 97% Wt Readings from Last 3 Encounters:  07/23/12 209 lb 12.8 oz (95.165 kg)  06/08/12 215 lb (97.523 kg)  01/09/12 204 lb 2 oz (92.59 kg)    General: Appears her stated age, well developed, well nourished in NAD. Cardiovascular: Normal rate and rhythm. S1,S2 noted.  No murmur, rubs or gallops noted. No JVD or BLE edema. No carotid bruits noted. Pulmonary/Chest: Normal effort and positive vesicular breath sounds. No respiratory distress. No wheezes, rales or ronchi noted.  Abdomen: Soft and tender in the right lower quadrant. + rebound tenderness. Normal bowel sounds, no bruits noted. No distention or masses noted. Liver, spleen and kidneys non palpable.        Assessment & Plan:   Sever Abdominal pain, new onset with additional workup required:  Will get urinalysis to r/o infection or blood for possible kidney stone Will get urine HCG to r/o pregnancy Will obtain CT abd to r/o appendicitis versus ruptured ovarian cyst  Will f/u after CT results

## 2012-07-23 NOTE — ED Notes (Addendum)
Pt sent here for RLQ pain that had CT for today and has appendicitis and told to come to ED; pt sts pain started yesterday

## 2012-07-23 NOTE — Anesthesia Preprocedure Evaluation (Addendum)
Anesthesia Evaluation  Patient identified by MRN, date of birth, ID band Patient awake    Reviewed: Allergy & Precautions, H&P , NPO status , Patient's Chart, lab work & pertinent test results, reviewed documented beta blocker date and time   History of Anesthesia Complications Negative for: history of anesthetic complications  Airway Mallampati: I TM Distance: >3 FB Neck ROM: Full    Dental  (+) Teeth Intact and Dental Advisory Given   Pulmonary sleep apnea and Continuous Positive Airway Pressure Ventilation ,          Cardiovascular hypertension, Pt. on home beta blockers + Valvular Problems/Murmurs (murmur as a child. )     Neuro/Psych PSYCHIATRIC DISORDERS Anxiety  Neuromuscular disease (MS- numbness in hands and feet. Weakness in hands, R leg. )    GI/Hepatic GERD-  Medicated and Controlled,  Endo/Other  Morbid obesity  Renal/GU      Musculoskeletal  (+) Fibromyalgia -  Abdominal   Peds  Hematology   Anesthesia Other Findings   Reproductive/Obstetrics Preg test 07/23/12 negative                       Anesthesia Physical Anesthesia Plan  ASA: II  Anesthesia Plan: General   Post-op Pain Management:    Induction: Intravenous  Airway Management Planned: Oral ETT  Additional Equipment:   Intra-op Plan:   Post-operative Plan: Extubation in OR  Informed Consent: I have reviewed the patients History and Physical, chart, labs and discussed the procedure including the risks, benefits and alternatives for the proposed anesthesia with the patient or authorized representative who has indicated his/her understanding and acceptance.     Plan Discussed with: CRNA and Surgeon  Anesthesia Plan Comments:         Anesthesia Quick Evaluation

## 2012-07-23 NOTE — ED Notes (Signed)
Pt right arm where cipro infusing is red and hives are present. Medication stopped, and MD notified.

## 2012-07-23 NOTE — Anesthesia Procedure Notes (Signed)
Procedure Name: Intubation Date/Time: 07/23/2012 10:09 PM Performed by: Orvilla Fus A Pre-anesthesia Checklist: Patient identified, Timeout performed, Emergency Drugs available, Suction available and Patient being monitored Patient Re-evaluated:Patient Re-evaluated prior to inductionOxygen Delivery Method: Circle system utilized Preoxygenation: Pre-oxygenation with 100% oxygen Intubation Type: IV induction, Cricoid Pressure applied and Rapid sequence Laryngoscope Size: Mac and 3 Grade View: Grade I Tube type: Oral Tube size: 7.0 mm Number of attempts: 1 Airway Equipment and Method: Stylet Placement Confirmation: ETT inserted through vocal cords under direct vision,  breath sounds checked- equal and bilateral and positive ETCO2 Secured at: 22 cm Tube secured with: Tape Dental Injury: Teeth and Oropharynx as per pre-operative assessment

## 2012-07-23 NOTE — ED Notes (Signed)
Dr Wilson at bedside.

## 2012-07-24 ENCOUNTER — Encounter (HOSPITAL_COMMUNITY): Payer: Self-pay | Admitting: *Deleted

## 2012-07-24 LAB — BASIC METABOLIC PANEL
BUN: 6 mg/dL (ref 6–23)
Calcium: 9.2 mg/dL (ref 8.4–10.5)
Creatinine, Ser: 0.65 mg/dL (ref 0.50–1.10)
GFR calc non Af Amer: >90 mL/min (ref >90)
Glucose, Bld: 179 mg/dL — ABNORMAL HIGH (ref 70–99)

## 2012-07-24 LAB — CBC
MCH: 28.3 pg (ref 26.0–34.0)
MCHC: 34.1 g/dL (ref 30.0–36.0)
Platelets: 265 10*3/uL (ref 150–400)
RDW: 14.6 % (ref 11.5–15.5)

## 2012-07-24 MED ORDER — CEFTRIAXONE SODIUM 2 G IJ SOLR
2.0000 g | Freq: Two times a day (BID) | INTRAMUSCULAR | Status: DC
  Administered 2012-07-24: 2 g via INTRAVENOUS
  Filled 2012-07-24 (×2): qty 2

## 2012-07-24 MED ORDER — ENOXAPARIN SODIUM 40 MG/0.4ML ~~LOC~~ SOLN
40.0000 mg | SUBCUTANEOUS | Status: DC
  Filled 2012-07-24: qty 0.4

## 2012-07-24 MED ORDER — ONDANSETRON HCL 4 MG PO TABS: 4.0000 mg | ORAL_TABLET | Freq: Four times a day (QID) | ORAL | Status: DC | PRN

## 2012-07-24 MED ORDER — ACETAMINOPHEN 10 MG/ML IV SOLN
1000.0000 mg | Freq: Four times a day (QID) | INTRAVENOUS | Status: DC
  Administered 2012-07-24 (×2): 1000 mg via INTRAVENOUS
  Filled 2012-07-24 (×5): qty 100

## 2012-07-24 MED ORDER — MORPHINE SULFATE 2 MG/ML IJ SOLN: 1.0000 mg | INTRAMUSCULAR | Status: DC | PRN

## 2012-07-24 MED ORDER — DSS 100 MG PO CAPS: 100.0000 mg | ORAL_CAPSULE | Freq: Two times a day (BID) | ORAL | Status: DC

## 2012-07-24 MED ORDER — METOPROLOL TARTRATE 100 MG PO TABS
100.0000 mg | ORAL_TABLET | Freq: Two times a day (BID) | ORAL | Status: DC
  Administered 2012-07-24 (×2): 100 mg via ORAL
  Filled 2012-07-24 (×3): qty 1

## 2012-07-24 MED ORDER — OXYCODONE HCL 5 MG PO TABS: 5.0000 mg | ORAL_TABLET | ORAL | Status: DC | PRN

## 2012-07-24 MED ORDER — FUROSEMIDE 40 MG PO TABS
40.0000 mg | ORAL_TABLET | Freq: Every day | ORAL | Status: DC
  Administered 2012-07-24: 40 mg via ORAL
  Filled 2012-07-24: qty 1

## 2012-07-24 MED ORDER — PANTOPRAZOLE SODIUM 40 MG IV SOLR
40.0000 mg | INTRAVENOUS | Status: DC
  Administered 2012-07-24: 40 mg via INTRAVENOUS
  Filled 2012-07-24 (×2): qty 40

## 2012-07-24 MED ORDER — DOCUSATE SODIUM 100 MG PO CAPS
100.0000 mg | ORAL_CAPSULE | Freq: Two times a day (BID) | ORAL | Status: DC
  Administered 2012-07-24: 100 mg via ORAL
  Filled 2012-07-24: qty 1

## 2012-07-24 MED ORDER — NORTRIPTYLINE HCL 25 MG PO CAPS
100.0000 mg | ORAL_CAPSULE | Freq: Every day | ORAL | Status: DC
  Administered 2012-07-24: 100 mg via ORAL
  Filled 2012-07-24 (×2): qty 4

## 2012-07-24 MED ORDER — NORTRIPTYLINE HCL 25 MG PO CAPS: 100.0000 mg | ORAL_CAPSULE | Freq: Every day | ORAL | Status: DC

## 2012-07-24 MED ORDER — GABAPENTIN 300 MG PO CAPS
300.0000 mg | ORAL_CAPSULE | Freq: Three times a day (TID) | ORAL | Status: DC
  Administered 2012-07-24: 300 mg via ORAL
  Filled 2012-07-24 (×3): qty 1

## 2012-07-24 MED ORDER — ONDANSETRON HCL 4 MG/2ML IJ SOLN: 4.0000 mg | Freq: Four times a day (QID) | INTRAMUSCULAR | Status: DC | PRN

## 2012-07-24 NOTE — Discharge Summary (Signed)
Patient ID: Courtney [name] MRN: [id number] DOB/AGE: Feb 12, 1976 37 y.o.  Admit date: 07/23/2012 Discharge date: 07/24/2012  Procedures: Laparoscopic appendectomy(Dr. Andrey Campanile 07/23/12)  Consults: none  Reason for Admission: right lower quadrant abdominal pain  Admission Diagnoses: Appendicitis  Hospital Course: 37 year old female with a past medical history of MS, OSA, hypertension, IBS, hyperlipidemia who presented to [facility] with sudden onset abdominal pain from her primary care office.  CT of abdomen and pelvis revealed acute appendicitis.  WBCs were normal, she was otherwise stable.  Dr. Andrey Campanile preceded with laparoscopic appendectomy on 07/23/12, no complications and without peritonitis.  She was started on rocephin preoperatively, which was discontinued the following morning along with the foley cath.  Diet was advanced, which she tolerated well.  She was ambulating along side husband.  Her pain was well controlled with oral medication.  She did have tachycardia overnight, which improved with resuming her metoprolol.  Upon my examination, heart rate was 76.  She denies chest pains, shortness of breath.  Denies n/v.  No flatus or bm thus far. Voiding without any difficulties.  Incisions are clean dry and intact.  She is stable for discharge.  She was advised to avoid lifting over 20lbs for 3 weeks.  She will follow up in the clinic in 3 weeks, an appointment has been made on her behalf.  Self care measures reviewed.  She was encouraged to call the clinic with any concerns including fever, chills, incision drainage or erythema.  Discharge Diagnoses:  Active Problems:   Acute appendicitis without mention of peritonitis   Discharge Medications:   Medication List    STOP taking these medications       loperamide 2 MG capsule  Commonly known as:  IMODIUM      TAKE these medications       cetirizine 10 MG tablet  Commonly known as:  ZYRTEC  Take 10 mg by mouth daily.     cyanocobalamin 1000  MCG/ML injection  Commonly known as:  (VITAMIN B-12)  Inject 1,000 mcg into the muscle every 30 (thirty) days.     DEPO-PROVERA 150 MG/ML injection  Generic drug:  medroxyPROGESTERone  Inject 150 mg into the muscle every 3 (three) months.     DSS 100 MG Caps  Take 100 mg by mouth 2 (two) times daily.     furosemide 40 MG tablet  Commonly known as:  LASIX  Take 40 mg by mouth daily. Take one tablet daily     gabapentin 300 MG capsule  Commonly known as:  NEURONTIN  Take 300 mg by mouth 3 (three) times daily.     metoprolol 100 MG tablet  Commonly known as:  LOPRESSOR  Take 100 mg by mouth 2 (two) times daily.     nortriptyline 50 MG capsule  Commonly known as:  PAMELOR  Take 100 mg by mouth at bedtime. At bedtime     omeprazole-sodium bicarbonate 40-1100 MG per capsule  Commonly known as:  ZEGERID  Take 1 capsule by mouth daily.     oxyCODONE 5 MG immediate release tablet  Commonly known as:  Oxy IR/ROXICODONE  Take 1-2 tablets (5-10 mg total) by mouth every 4 (four) hours as needed for pain.     potassium chloride 10 MEQ tablet  Commonly known as:  K-DUR,KLOR-CON  Take 10 mEq by mouth daily. Take one tablet daily     simvastatin 40 MG tablet  Commonly known as:  ZOCOR  Take 40 mg by mouth at  bedtime. Take one tablet daily     Vitamin D 2000 UNITS Caps  Take 1 capsule by mouth every morning.        Discharge Instructions:     Follow-up Information   Follow up with [facility] In 3 weeks. (you are scheduled at 11:15 am.  Please arrive prior to your appointment.  You may and reschedule your appointment if necessary.)    Contact information:   [address] [place] [address]       Signed: Ashok Norris 07/24/2012, 3:08 PM

## 2012-07-24 NOTE — Progress Notes (Signed)
Patient placed on CPAP per home regimen. Did know her setting and was unable to locate an Auto machine. So set patient on comfortable settings at [facility]. Patient wears full face mask at home and was able to remove mask if needed. RN aware and patient resting comfortably.

## 2012-07-24 NOTE — Progress Notes (Signed)
1 Day Post-Op  Subjective: Pt. Doing well.  Sitting up in bed, family at bedside.  Reports minimal abdominal discomfort following surgery.  She has not had breakfast, foley just taken out.  Denies nausea or vomiting.  No BMs of flatus yet.    Objective: Vital signs in last 24 hours: Temp:  [98.1 F (36.7 C)-98.7 F (37.1 C)] 98.2 F (36.8 C) (04/08 0504) Pulse Rate:  [75-127] 124 (04/08 0504) Resp:  [15-30] 22 (04/08 0504) BP: (110-140)/(57-91) 126/71 mmHg (04/08 0504) SpO2:  [93 %-100 %] 96 % (04/08 0504) FiO2 (%):  [50 %] 50 % (04/07 2335) Weight:  [209 lb 12.8 oz (95.165 kg)-211 lb 10.3 oz (96 kg)] 211 lb 10.3 oz (96 kg) (04/08 0015) Last BM Date: 07/23/12  Intake/Output from previous day: 04/07 0701 - 04/08 0700 In: 500 [I.V.:500] Out: 400 [Urine:400] Intake/Output this shift: Total I/O In: -  Out: 450 [Urine:450]  General appearance: alert, cooperative and no distress Cardio: regular rate and rhythm, S1, S2 normal, no murmur, click, rub or gallop GI: hypoactive bowel sounds, no masses, incisions dry and intact.  No hernia.  Very minimal TTP RLQ and LLQ Extremities: extremities normal, atraumatic, no cyanosis or edema Pulses: 2+ and symmetric Skin: Skin color, texture, turgor normal. No rashes or lesions Neurologic: Alert and oriented X 3, normal strength and tone. Normal symmetric reflexes. Normal coordination and gait  Lab Results:   Recent Labs  07/23/12 1625 07/24/12 0622  WBC 10.4 9.4  HGB 13.2 11.9*  HCT 38.4 34.9*  PLT 273 265   BMET  Recent Labs  07/23/12 1625 07/24/12 0622  NA 136 136  K 3.8 4.3  CL 99 103  CO2 24 20  GLUCOSE 109* 179*  BUN 8 6  CREATININE 0.86 0.65  CALCIUM 10.0 9.2    Studies/Results: Ct Abdomen Pelvis W Contrast  07/23/2012  *RADIOLOGY REPORT*  Clinical Data: Right lower quadrant acute abdominal pain.  CT ABDOMEN AND PELVIS WITH CONTRAST  Technique:  Multidetector CT imaging of the abdomen and pelvis was performed  following the standard protocol during bolus administration of intravenous contrast.  Contrast: OMNIPAQUE IOHEXOL 300 MG/ML  SOLN  Comparison: Ultrasound 04/22/2009  Findings: There is mild scarring at the lung bases.  No active cardiopulmonary process evident.  The liver shows mild fatty change.  No evidence of mass or ductal dilatation.  No calcified gallstones.  The spleen is normal.  The pancreas is normal.  The adrenal glands are normal.  The right kidney is normal.  The left kidney is normal except for a 1 cm cyst in the upper pole.  The aorta and IVC are normal.  No retroperitoneal mass or adenopathy.  The appendix is abnormal showing a diameter of 11 mm with mild surrounding inflammation.  No evidence of abscess or rupture. There are a few regional lymph nodes are likely reactive.  There could be mild inflammation of the cecum as well.  Small bowel pattern is normal.  Distal colon is normal.  Uterus and adnexal regions are normal.  No significant bony finding.  IMPRESSION: Acute uncomplicated appendicitis.  Mild regional fat inflammation. Possible inflammation of the cecum.  Fatty liver.   Original Report Authenticated By: Paulina Fusi, M.D.     Anti-infectives: Anti-infectives   Start     Dose/Rate Route Frequency Ordered Stop   07/24/12 0800  cefTRIAXone (ROCEPHIN) 2 g in dextrose 5 % 50 mL IVPB     2 g 100 mL/hr over 30  Minutes Intravenous Every 12 hours 07/24/12 0035 07/25/12 0759   07/24/12 0200  metroNIDAZOLE (FLAGYL) IVPB 500 mg     500 mg 100 mL/hr over 60 Minutes Intravenous Every 8 hours 07/23/12 2359 07/24/12 1759   07/23/12 2115  [MAR Hold]  cefTRIAXone (ROCEPHIN) 2 g in dextrose 5 % 50 mL IVPB     (On MAR Hold since 07/23/12 2220)  Comments:  Send to OR   2 g 100 mL/hr over 30 Minutes Intravenous  Once 07/23/12 2111 07/23/12 2217   07/23/12 1800  ciprofloxacin (CIPRO) IVPB 400 mg     400 mg 200 mL/hr over 60 Minutes Intravenous  Once 07/23/12 1748 07/23/12 2011   07/23/12  1800  metroNIDAZOLE (FLAGYL) IVPB 500 mg     500 mg 100 mL/hr over 60 Minutes Intravenous  Once 07/23/12 1748 07/23/12 1942      Assessment/Plan: Laparoscopic Appendectomy without perforation(07/23/12 Dr. Andrey Campanile) Advance diet as tolerated.  Ambulate DC rocephin If tolerating oral intake, voiding without difficulties and pain is under good control, may be discharged home later today    LOS: 1 day    Caridad Silveira, [facility] 07/24/2012

## 2012-07-24 NOTE — Progress Notes (Signed)
Agree with above, comfortable. Tachycardic asymptomatic states this does this at home at times. All home meds restarted will recheck later today, saline lock iv

## 2012-07-25 ENCOUNTER — Encounter (HOSPITAL_COMMUNITY): Payer: Self-pay | Admitting: General Surgery

## 2012-08-06 ENCOUNTER — Encounter (INDEPENDENT_AMBULATORY_CARE_PROVIDER_SITE_OTHER): Payer: Self-pay | Admitting: *Deleted

## 2012-08-09 ENCOUNTER — Ambulatory Visit (INDEPENDENT_AMBULATORY_CARE_PROVIDER_SITE_OTHER): Payer: Medicare Other

## 2012-08-09 DIAGNOSIS — E538 Deficiency of other specified B group vitamins: Secondary | ICD-10-CM | POA: Diagnosis not present

## 2012-08-09 MED ORDER — CYANOCOBALAMIN 1000 MCG/ML IJ SOLN
1000.0000 ug | Freq: Once | INTRAMUSCULAR | Status: AC
  Administered 2012-08-09: 1000 ug via INTRAMUSCULAR

## 2012-08-14 ENCOUNTER — Encounter (INDEPENDENT_AMBULATORY_CARE_PROVIDER_SITE_OTHER): Payer: Self-pay

## 2012-08-14 ENCOUNTER — Ambulatory Visit (INDEPENDENT_AMBULATORY_CARE_PROVIDER_SITE_OTHER): Payer: Medicare Other | Admitting: Internal Medicine

## 2012-08-14 VITALS — BP 114/76 | HR 74 | Temp 98.3°F | Resp 16 | Ht 61.0 in | Wt 211.8 lb

## 2012-08-14 DIAGNOSIS — K3580 Unspecified acute appendicitis: Secondary | ICD-10-CM

## 2012-08-14 NOTE — Progress Notes (Signed)
  Subjective: Pt returns to the clinic today after undergoing laparoscopic appendectomy on 07/23/12 by Dr. Andrey Campanile.  The patient is tolerating their diet well and is having no severe pain.  Bowel function is good.  No problems with the wounds.  Objective: Vital signs in last 24 hours: Reviewed  PE: Abd: soft, non-tender, +bs, incisions well healed  Lab Results:  No results found for this basename: WBC, HGB, HCT, PLT,  in the last 72 hours BMET No results found for this basename: NA, K, CL, CO2, GLUCOSE, BUN, CREATININE, CALCIUM,  in the last 72 hours PT/INR No results found for this basename: LABPROT, INR,  in the last 72 hours CMP     Component Value Date/Time   NA 136 07/24/2012 0622   K 4.3 07/24/2012 0622   CL 103 07/24/2012 0622   CO2 20 07/24/2012 0622   GLUCOSE 179* 07/24/2012 0622   BUN 6 07/24/2012 0622   CREATININE 0.65 07/24/2012 0622   CALCIUM 9.2 07/24/2012 0622   PROT 7.8 07/23/2012 1625   ALBUMIN 4.1 07/23/2012 1625   AST 18 07/23/2012 1625   ALT 18 07/23/2012 1625   ALKPHOS 89 07/23/2012 1625   BILITOT 0.8 07/23/2012 1625   GFRNONAA >90 07/24/2012 0622   GFRAA >90 07/24/2012 0622   Lipase     Component Value Date/Time   LIPASE 15.0 09/06/2006 0908       Studies/Results: No results found.  Anti-infectives: Anti-infectives   None       Assessment/Plan  1.  S/P Laparoscopic Appendectomy: doing well, may resume regular activity without restrictions, Pt will follow up with [name] PRN and knows to call with questions or concerns.     Courtney [name] 08/14/2012

## 2012-08-14 NOTE — Patient Instructions (Signed)
May resume regular activity without restrictions. Follow up as needed. Call with questions or concerns.  

## 2012-09-07 ENCOUNTER — Ambulatory Visit (INDEPENDENT_AMBULATORY_CARE_PROVIDER_SITE_OTHER): Payer: Medicare Other

## 2012-09-07 DIAGNOSIS — E538 Deficiency of other specified B group vitamins: Secondary | ICD-10-CM

## 2012-09-07 MED ORDER — CYANOCOBALAMIN 1000 MCG/ML IJ SOLN
1000.0000 ug | Freq: Once | INTRAMUSCULAR | Status: AC
  Administered 2012-09-07: 1000 ug via INTRAMUSCULAR

## 2012-10-09 ENCOUNTER — Ambulatory Visit (INDEPENDENT_AMBULATORY_CARE_PROVIDER_SITE_OTHER): Payer: Medicare Other

## 2012-10-09 DIAGNOSIS — Z309 Encounter for contraceptive management, unspecified: Secondary | ICD-10-CM

## 2012-10-09 DIAGNOSIS — E538 Deficiency of other specified B group vitamins: Secondary | ICD-10-CM | POA: Diagnosis not present

## 2012-10-09 MED ORDER — CYANOCOBALAMIN 1000 MCG/ML IJ SOLN
1000.0000 ug | Freq: Once | INTRAMUSCULAR | Status: AC
  Administered 2012-10-09: 1000 ug via INTRAMUSCULAR

## 2012-10-09 MED ORDER — MEDROXYPROGESTERONE ACETATE 150 MG/ML IM SUSP
150.0000 mg | Freq: Once | INTRAMUSCULAR | Status: AC
  Administered 2012-10-09: 150 mg via INTRAMUSCULAR

## 2012-11-14 DIAGNOSIS — G35 Multiple sclerosis: Secondary | ICD-10-CM | POA: Diagnosis not present

## 2012-11-20 ENCOUNTER — Other Ambulatory Visit: Payer: Self-pay | Admitting: Internal Medicine

## 2012-11-21 DIAGNOSIS — G35 Multiple sclerosis: Secondary | ICD-10-CM | POA: Diagnosis not present

## 2012-12-11 ENCOUNTER — Other Ambulatory Visit: Payer: Self-pay | Admitting: Internal Medicine

## 2013-01-09 ENCOUNTER — Ambulatory Visit (INDEPENDENT_AMBULATORY_CARE_PROVIDER_SITE_OTHER): Payer: Medicare Other

## 2013-01-09 DIAGNOSIS — Z309 Encounter for contraceptive management, unspecified: Secondary | ICD-10-CM | POA: Diagnosis not present

## 2013-01-09 DIAGNOSIS — E538 Deficiency of other specified B group vitamins: Secondary | ICD-10-CM

## 2013-01-09 DIAGNOSIS — Z23 Encounter for immunization: Secondary | ICD-10-CM

## 2013-01-09 MED ORDER — MEDROXYPROGESTERONE ACETATE 150 MG/ML IM SUSP
150.0000 mg | Freq: Once | INTRAMUSCULAR | Status: AC
  Administered 2013-01-09: 150 mg via INTRAMUSCULAR

## 2013-01-09 MED ORDER — CYANOCOBALAMIN 1000 MCG/ML IJ SOLN
1000.0000 ug | Freq: Once | INTRAMUSCULAR | Status: AC
  Administered 2013-01-09: 1000 ug via INTRAMUSCULAR

## 2013-01-10 DIAGNOSIS — H469 Unspecified optic neuritis: Secondary | ICD-10-CM | POA: Diagnosis not present

## 2013-01-10 DIAGNOSIS — G35 Multiple sclerosis: Secondary | ICD-10-CM | POA: Diagnosis not present

## 2013-02-08 ENCOUNTER — Ambulatory Visit (INDEPENDENT_AMBULATORY_CARE_PROVIDER_SITE_OTHER): Payer: Medicare Other

## 2013-02-08 DIAGNOSIS — E538 Deficiency of other specified B group vitamins: Secondary | ICD-10-CM

## 2013-02-08 MED ORDER — CYANOCOBALAMIN 1000 MCG/ML IJ SOLN
1000.0000 ug | Freq: Once | INTRAMUSCULAR | Status: AC
  Administered 2013-02-08: 1000 ug via INTRAMUSCULAR

## 2013-02-21 DIAGNOSIS — G5600 Carpal tunnel syndrome, unspecified upper limb: Secondary | ICD-10-CM | POA: Diagnosis not present

## 2013-02-21 DIAGNOSIS — G35 Multiple sclerosis: Secondary | ICD-10-CM | POA: Diagnosis not present

## 2013-03-11 ENCOUNTER — Ambulatory Visit: Payer: Medicare Other

## 2013-03-11 DIAGNOSIS — G5600 Carpal tunnel syndrome, unspecified upper limb: Secondary | ICD-10-CM | POA: Diagnosis not present

## 2013-03-19 DIAGNOSIS — Z888 Allergy status to other drugs, medicaments and biological substances status: Secondary | ICD-10-CM | POA: Diagnosis not present

## 2013-03-19 DIAGNOSIS — Z79899 Other long term (current) drug therapy: Secondary | ICD-10-CM | POA: Diagnosis not present

## 2013-03-19 DIAGNOSIS — K219 Gastro-esophageal reflux disease without esophagitis: Secondary | ICD-10-CM | POA: Diagnosis not present

## 2013-03-19 DIAGNOSIS — G5600 Carpal tunnel syndrome, unspecified upper limb: Secondary | ICD-10-CM | POA: Diagnosis not present

## 2013-03-19 DIAGNOSIS — G35 Multiple sclerosis: Secondary | ICD-10-CM | POA: Diagnosis not present

## 2013-03-25 DIAGNOSIS — H469 Unspecified optic neuritis: Secondary | ICD-10-CM | POA: Diagnosis not present

## 2013-03-25 DIAGNOSIS — G35 Multiple sclerosis: Secondary | ICD-10-CM | POA: Diagnosis not present

## 2013-04-01 DIAGNOSIS — G5600 Carpal tunnel syndrome, unspecified upper limb: Secondary | ICD-10-CM | POA: Diagnosis not present

## 2013-04-09 ENCOUNTER — Ambulatory Visit (INDEPENDENT_AMBULATORY_CARE_PROVIDER_SITE_OTHER): Payer: Medicare Other | Admitting: *Deleted

## 2013-04-09 DIAGNOSIS — Z309 Encounter for contraceptive management, unspecified: Secondary | ICD-10-CM

## 2013-04-09 DIAGNOSIS — E538 Deficiency of other specified B group vitamins: Secondary | ICD-10-CM | POA: Diagnosis not present

## 2013-04-09 MED ORDER — MEDROXYPROGESTERONE ACETATE 150 MG/ML IM SUSP
150.0000 mg | Freq: Once | INTRAMUSCULAR | Status: AC
  Administered 2013-04-09: 150 mg via INTRAMUSCULAR

## 2013-04-09 MED ORDER — CYANOCOBALAMIN 1000 MCG/ML IJ SOLN
1000.0000 ug | Freq: Once | INTRAMUSCULAR | Status: AC
  Administered 2013-04-09: 1000 ug via INTRAMUSCULAR

## 2013-04-16 ENCOUNTER — Other Ambulatory Visit: Payer: Self-pay | Admitting: Internal Medicine

## 2013-05-10 ENCOUNTER — Ambulatory Visit (INDEPENDENT_AMBULATORY_CARE_PROVIDER_SITE_OTHER): Payer: Medicare Other

## 2013-05-10 DIAGNOSIS — E538 Deficiency of other specified B group vitamins: Secondary | ICD-10-CM

## 2013-05-10 MED ORDER — CYANOCOBALAMIN 1000 MCG/ML IJ SOLN
1000.0000 ug | Freq: Once | INTRAMUSCULAR | Status: AC
  Administered 2013-05-10: 1000 ug via INTRAMUSCULAR

## 2013-05-30 DIAGNOSIS — G35 Multiple sclerosis: Secondary | ICD-10-CM | POA: Diagnosis not present

## 2013-06-09 DIAGNOSIS — G5600 Carpal tunnel syndrome, unspecified upper limb: Secondary | ICD-10-CM | POA: Diagnosis not present

## 2013-06-09 DIAGNOSIS — IMO0001 Reserved for inherently not codable concepts without codable children: Secondary | ICD-10-CM | POA: Diagnosis not present

## 2013-06-09 DIAGNOSIS — K219 Gastro-esophageal reflux disease without esophagitis: Secondary | ICD-10-CM | POA: Diagnosis not present

## 2013-06-09 DIAGNOSIS — I10 Essential (primary) hypertension: Secondary | ICD-10-CM | POA: Diagnosis not present

## 2013-06-09 DIAGNOSIS — G35 Multiple sclerosis: Secondary | ICD-10-CM | POA: Diagnosis not present

## 2013-06-09 DIAGNOSIS — Z881 Allergy status to other antibiotic agents status: Secondary | ICD-10-CM | POA: Diagnosis not present

## 2013-06-09 DIAGNOSIS — E538 Deficiency of other specified B group vitamins: Secondary | ICD-10-CM | POA: Diagnosis not present

## 2013-06-09 DIAGNOSIS — E781 Pure hyperglyceridemia: Secondary | ICD-10-CM | POA: Diagnosis not present

## 2013-06-09 DIAGNOSIS — Z880 Allergy status to penicillin: Secondary | ICD-10-CM | POA: Diagnosis not present

## 2013-06-10 DIAGNOSIS — I10 Essential (primary) hypertension: Secondary | ICD-10-CM | POA: Diagnosis not present

## 2013-06-10 DIAGNOSIS — Z880 Allergy status to penicillin: Secondary | ICD-10-CM | POA: Diagnosis not present

## 2013-06-10 DIAGNOSIS — E538 Deficiency of other specified B group vitamins: Secondary | ICD-10-CM | POA: Diagnosis not present

## 2013-06-10 DIAGNOSIS — G35 Multiple sclerosis: Secondary | ICD-10-CM | POA: Diagnosis not present

## 2013-06-10 DIAGNOSIS — K219 Gastro-esophageal reflux disease without esophagitis: Secondary | ICD-10-CM | POA: Diagnosis not present

## 2013-06-10 DIAGNOSIS — Z881 Allergy status to other antibiotic agents status: Secondary | ICD-10-CM | POA: Diagnosis not present

## 2013-07-08 ENCOUNTER — Ambulatory Visit (INDEPENDENT_AMBULATORY_CARE_PROVIDER_SITE_OTHER): Payer: Medicare Other | Admitting: *Deleted

## 2013-07-08 DIAGNOSIS — E538 Deficiency of other specified B group vitamins: Secondary | ICD-10-CM

## 2013-07-08 DIAGNOSIS — Z309 Encounter for contraceptive management, unspecified: Secondary | ICD-10-CM | POA: Diagnosis not present

## 2013-07-08 MED ORDER — MEDROXYPROGESTERONE ACETATE 150 MG/ML IM SUSP
150.0000 mg | Freq: Once | INTRAMUSCULAR | Status: AC
  Administered 2013-07-08: 150 mg via INTRAMUSCULAR

## 2013-07-08 MED ORDER — CYANOCOBALAMIN 1000 MCG/ML IJ SOLN
1000.0000 ug | Freq: Once | INTRAMUSCULAR | Status: AC
  Administered 2013-07-08: 1000 ug via INTRAMUSCULAR

## 2013-07-30 DIAGNOSIS — G35 Multiple sclerosis: Secondary | ICD-10-CM | POA: Diagnosis not present

## 2013-08-08 ENCOUNTER — Ambulatory Visit (INDEPENDENT_AMBULATORY_CARE_PROVIDER_SITE_OTHER): Payer: Medicare Other | Admitting: *Deleted

## 2013-08-08 DIAGNOSIS — E538 Deficiency of other specified B group vitamins: Secondary | ICD-10-CM | POA: Diagnosis not present

## 2013-08-08 MED ORDER — CYANOCOBALAMIN 1000 MCG/ML IJ SOLN
1000.0000 ug | Freq: Once | INTRAMUSCULAR | Status: AC
  Administered 2013-08-08: 1000 ug via INTRAMUSCULAR

## 2013-09-06 ENCOUNTER — Ambulatory Visit (INDEPENDENT_AMBULATORY_CARE_PROVIDER_SITE_OTHER): Payer: Medicare Other

## 2013-09-06 DIAGNOSIS — E538 Deficiency of other specified B group vitamins: Secondary | ICD-10-CM

## 2013-09-06 MED ORDER — CYANOCOBALAMIN 1000 MCG/ML IJ SOLN
1000.0000 ug | Freq: Once | INTRAMUSCULAR | Status: AC
  Administered 2013-09-06: 1000 ug via INTRAMUSCULAR

## 2013-09-16 DIAGNOSIS — M25569 Pain in unspecified knee: Secondary | ICD-10-CM | POA: Diagnosis not present

## 2013-09-23 DIAGNOSIS — M1710 Unilateral primary osteoarthritis, unspecified knee: Secondary | ICD-10-CM | POA: Diagnosis not present

## 2013-10-08 ENCOUNTER — Ambulatory Visit (INDEPENDENT_AMBULATORY_CARE_PROVIDER_SITE_OTHER): Payer: Medicare Other

## 2013-10-08 DIAGNOSIS — Z309 Encounter for contraceptive management, unspecified: Secondary | ICD-10-CM

## 2013-10-08 DIAGNOSIS — E538 Deficiency of other specified B group vitamins: Secondary | ICD-10-CM | POA: Diagnosis not present

## 2013-10-08 MED ORDER — CYANOCOBALAMIN 1000 MCG/ML IJ SOLN
1000.0000 ug | Freq: Once | INTRAMUSCULAR | Status: AC
  Administered 2013-10-08: 1000 ug via INTRAMUSCULAR

## 2013-10-08 MED ORDER — MEDROXYPROGESTERONE ACETATE 150 MG/ML IM SUSP
150.0000 mg | Freq: Once | INTRAMUSCULAR | Status: AC
  Administered 2013-10-08: 150 mg via INTRAMUSCULAR

## 2013-11-07 ENCOUNTER — Ambulatory Visit (INDEPENDENT_AMBULATORY_CARE_PROVIDER_SITE_OTHER): Payer: Medicare Other | Admitting: *Deleted

## 2013-11-07 DIAGNOSIS — E538 Deficiency of other specified B group vitamins: Secondary | ICD-10-CM | POA: Diagnosis not present

## 2013-11-07 MED ORDER — CYANOCOBALAMIN 1000 MCG/ML IJ SOLN
1000.0000 ug | Freq: Once | INTRAMUSCULAR | Status: AC
  Administered 2013-11-07: 1000 ug via INTRAMUSCULAR

## 2013-12-02 DIAGNOSIS — G35 Multiple sclerosis: Secondary | ICD-10-CM | POA: Diagnosis not present

## 2013-12-04 DIAGNOSIS — G35 Multiple sclerosis: Secondary | ICD-10-CM | POA: Diagnosis not present

## 2013-12-11 ENCOUNTER — Other Ambulatory Visit: Payer: Self-pay | Admitting: Internal Medicine

## 2014-01-07 ENCOUNTER — Ambulatory Visit (INDEPENDENT_AMBULATORY_CARE_PROVIDER_SITE_OTHER): Payer: Medicare Other

## 2014-01-07 DIAGNOSIS — Z308 Encounter for other contraceptive management: Secondary | ICD-10-CM | POA: Diagnosis not present

## 2014-01-07 DIAGNOSIS — E538 Deficiency of other specified B group vitamins: Secondary | ICD-10-CM

## 2014-01-07 MED ORDER — MEDROXYPROGESTERONE ACETATE 150 MG/ML IM SUSP
150.0000 mg | Freq: Once | INTRAMUSCULAR | Status: AC
  Administered 2014-01-07: 150 mg via INTRAMUSCULAR

## 2014-01-07 MED ORDER — CYANOCOBALAMIN 1000 MCG/ML IJ SOLN
1000.0000 ug | Freq: Once | INTRAMUSCULAR | Status: AC
  Administered 2014-01-07: 1000 ug via INTRAMUSCULAR

## 2014-01-08 ENCOUNTER — Ambulatory Visit: Payer: Medicare Other

## 2014-01-08 DIAGNOSIS — M25569 Pain in unspecified knee: Secondary | ICD-10-CM | POA: Diagnosis not present

## 2014-01-08 DIAGNOSIS — IMO0001 Reserved for inherently not codable concepts without codable children: Secondary | ICD-10-CM | POA: Diagnosis not present

## 2014-01-08 DIAGNOSIS — R269 Unspecified abnormalities of gait and mobility: Secondary | ICD-10-CM | POA: Diagnosis not present

## 2014-01-08 DIAGNOSIS — R279 Unspecified lack of coordination: Secondary | ICD-10-CM | POA: Diagnosis not present

## 2014-01-08 DIAGNOSIS — G35 Multiple sclerosis: Secondary | ICD-10-CM | POA: Diagnosis not present

## 2014-01-13 DIAGNOSIS — G35 Multiple sclerosis: Secondary | ICD-10-CM | POA: Diagnosis not present

## 2014-01-13 DIAGNOSIS — R279 Unspecified lack of coordination: Secondary | ICD-10-CM | POA: Diagnosis not present

## 2014-01-13 DIAGNOSIS — IMO0001 Reserved for inherently not codable concepts without codable children: Secondary | ICD-10-CM | POA: Diagnosis not present

## 2014-01-20 DIAGNOSIS — G35 Multiple sclerosis: Secondary | ICD-10-CM | POA: Diagnosis not present

## 2014-01-20 DIAGNOSIS — R279 Unspecified lack of coordination: Secondary | ICD-10-CM | POA: Diagnosis not present

## 2014-01-21 ENCOUNTER — Encounter: Payer: Self-pay | Admitting: Internal Medicine

## 2014-01-23 DIAGNOSIS — G35 Multiple sclerosis: Secondary | ICD-10-CM | POA: Diagnosis not present

## 2014-01-23 DIAGNOSIS — R279 Unspecified lack of coordination: Secondary | ICD-10-CM | POA: Diagnosis not present

## 2014-01-27 DIAGNOSIS — R279 Unspecified lack of coordination: Secondary | ICD-10-CM | POA: Diagnosis not present

## 2014-01-27 DIAGNOSIS — G35 Multiple sclerosis: Secondary | ICD-10-CM | POA: Diagnosis not present

## 2014-02-05 DIAGNOSIS — R279 Unspecified lack of coordination: Secondary | ICD-10-CM | POA: Diagnosis not present

## 2014-02-05 DIAGNOSIS — G35 Multiple sclerosis: Secondary | ICD-10-CM | POA: Diagnosis not present

## 2014-02-06 DIAGNOSIS — R279 Unspecified lack of coordination: Secondary | ICD-10-CM | POA: Diagnosis not present

## 2014-02-06 DIAGNOSIS — G35 Multiple sclerosis: Secondary | ICD-10-CM | POA: Diagnosis not present

## 2014-02-07 ENCOUNTER — Ambulatory Visit (INDEPENDENT_AMBULATORY_CARE_PROVIDER_SITE_OTHER): Payer: Medicare Other | Admitting: *Deleted

## 2014-02-07 DIAGNOSIS — Z23 Encounter for immunization: Secondary | ICD-10-CM | POA: Diagnosis not present

## 2014-02-07 DIAGNOSIS — E538 Deficiency of other specified B group vitamins: Secondary | ICD-10-CM

## 2014-02-07 MED ORDER — CYANOCOBALAMIN 1000 MCG/ML IJ SOLN
1000.0000 ug | Freq: Once | INTRAMUSCULAR | Status: AC
  Administered 2014-02-07: 1000 ug via INTRAMUSCULAR

## 2014-02-13 DIAGNOSIS — G35 Multiple sclerosis: Secondary | ICD-10-CM | POA: Diagnosis not present

## 2014-02-13 DIAGNOSIS — R279 Unspecified lack of coordination: Secondary | ICD-10-CM | POA: Diagnosis not present

## 2014-03-04 DIAGNOSIS — G35 Multiple sclerosis: Secondary | ICD-10-CM | POA: Diagnosis not present

## 2014-03-04 DIAGNOSIS — R269 Unspecified abnormalities of gait and mobility: Secondary | ICD-10-CM | POA: Diagnosis not present

## 2014-03-04 DIAGNOSIS — M25562 Pain in left knee: Secondary | ICD-10-CM | POA: Diagnosis not present

## 2014-03-11 ENCOUNTER — Ambulatory Visit (INDEPENDENT_AMBULATORY_CARE_PROVIDER_SITE_OTHER): Payer: Medicare Other | Admitting: *Deleted

## 2014-03-11 DIAGNOSIS — E538 Deficiency of other specified B group vitamins: Secondary | ICD-10-CM

## 2014-03-11 MED ORDER — CYANOCOBALAMIN 1000 MCG/ML IJ SOLN
1000.0000 ug | Freq: Once | INTRAMUSCULAR | Status: AC
  Administered 2014-03-11: 1000 ug via INTRAMUSCULAR

## 2014-04-09 ENCOUNTER — Ambulatory Visit: Payer: Medicare Other

## 2014-04-14 ENCOUNTER — Ambulatory Visit: Payer: Medicare Other

## 2014-04-14 NOTE — Progress Notes (Signed)
Pt came in for her appt. LOV with PCP or any Physician here was 02/2012. Not able to give pt Depo Provera or B12 injection and pt was instructed to make an appt as soon as pt schedule allowed.

## 2014-04-14 NOTE — Progress Notes (Signed)
   Subjective:    Patient ID: Courtney [name], female    DOB: [birth year], 38 y.o.   MRN: [id number]  HPI    Review of Systems     Objective:   Physical Exam        Assessment & Plan:

## 2014-04-22 ENCOUNTER — Encounter: Payer: Self-pay | Admitting: Internal Medicine

## 2014-04-22 ENCOUNTER — Other Ambulatory Visit (INDEPENDENT_AMBULATORY_CARE_PROVIDER_SITE_OTHER): Payer: Medicare Other

## 2014-04-22 ENCOUNTER — Ambulatory Visit (INDEPENDENT_AMBULATORY_CARE_PROVIDER_SITE_OTHER): Payer: Medicare Other | Admitting: Internal Medicine

## 2014-04-22 VITALS — BP 110/72 | HR 88 | Temp 99.3°F | Ht 61.0 in | Wt 219.5 lb

## 2014-04-22 DIAGNOSIS — Z309 Encounter for contraceptive management, unspecified: Secondary | ICD-10-CM

## 2014-04-22 DIAGNOSIS — D369 Benign neoplasm, unspecified site: Secondary | ICD-10-CM

## 2014-04-22 DIAGNOSIS — Z0000 Encounter for general adult medical examination without abnormal findings: Secondary | ICD-10-CM

## 2014-04-22 DIAGNOSIS — E785 Hyperlipidemia, unspecified: Secondary | ICD-10-CM

## 2014-04-22 DIAGNOSIS — E538 Deficiency of other specified B group vitamins: Secondary | ICD-10-CM

## 2014-04-22 DIAGNOSIS — Z23 Encounter for immunization: Secondary | ICD-10-CM | POA: Diagnosis not present

## 2014-04-22 LAB — LIPID PANEL
CHOL/HDL RATIO: 6
CHOLESTEROL: 210 mg/dL — AB (ref 0–200)
HDL: 33.1 mg/dL — ABNORMAL LOW (ref >39.00)
NONHDL: 176.9
VLDL: 100.4 mg/dL — AB (ref 0.0–40.0)

## 2014-04-22 LAB — LDL CHOLESTEROL, DIRECT: LDL DIRECT: 116.4 mg/dL

## 2014-04-22 LAB — BASIC METABOLIC PANEL
BUN: 10 mg/dL (ref 6–23)
CALCIUM: 9.1 mg/dL (ref 8.4–10.5)
CO2: 26 mEq/L (ref 19–32)
Chloride: 105 mEq/L (ref 96–112)
Creatinine, Ser: 0.7 mg/dL (ref 0.4–1.2)
GFR: 104.73 mL/min (ref >60.00)
GLUCOSE: 95 mg/dL (ref 70–99)
POTASSIUM: 3.6 meq/L (ref 3.5–5.1)
SODIUM: 137 meq/L (ref 135–145)

## 2014-04-22 LAB — URINALYSIS, ROUTINE W REFLEX MICROSCOPIC
BILIRUBIN URINE: NEGATIVE
HGB URINE DIPSTICK: NEGATIVE
KETONES UR: NEGATIVE
LEUKOCYTES UA: NEGATIVE
Nitrite: NEGATIVE
Specific Gravity, Urine: 1.02 (ref 1.000–1.030)
Total Protein, Urine: NEGATIVE
UROBILINOGEN UA: 1 (ref 0.0–1.0)
Urine Glucose: NEGATIVE
pH: 7 (ref 5.0–8.0)

## 2014-04-22 LAB — HEPATIC FUNCTION PANEL
ALBUMIN: 4 g/dL (ref 3.5–5.2)
ALK PHOS: 60 U/L (ref 39–117)
ALT: 17 U/L (ref 0–35)
AST: 16 U/L (ref 0–37)
BILIRUBIN TOTAL: 0.5 mg/dL (ref 0.2–1.2)
Bilirubin, Direct: 0.1 mg/dL (ref 0.0–0.3)
Total Protein: 7.2 g/dL (ref 6.0–8.3)

## 2014-04-22 LAB — CBC WITH DIFFERENTIAL/PLATELET
BASOS ABS: 0 10*3/uL (ref 0.0–0.1)
Basophils Relative: 0.5 % (ref 0.0–3.0)
EOS ABS: 0.2 10*3/uL (ref 0.0–0.7)
Eosinophils Relative: 3.7 % (ref 0.0–5.0)
HEMATOCRIT: 35.7 % — AB (ref 36.0–46.0)
HEMOGLOBIN: 11.7 g/dL — AB (ref 12.0–15.0)
LYMPHS PCT: 15.9 % (ref 12.0–46.0)
Lymphs Abs: 0.8 10*3/uL (ref 0.7–4.0)
MCHC: 32.7 g/dL (ref 30.0–36.0)
MCV: 82.4 fl (ref 78.0–100.0)
MONOS PCT: 7 % (ref 3.0–12.0)
Monocytes Absolute: 0.4 10*3/uL (ref 0.1–1.0)
NEUTROS ABS: 3.9 10*3/uL (ref 1.4–7.7)
NEUTROS PCT: 72.9 % (ref 43.0–77.0)
Platelets: 333 10*3/uL (ref 150.0–400.0)
RBC: 4.33 Mil/uL (ref 3.87–5.11)
RDW: 15 % (ref 11.5–15.5)
WBC: 5.3 10*3/uL (ref 4.0–10.5)

## 2014-04-22 LAB — TSH: TSH: 1.96 u[IU]/mL (ref 0.35–4.50)

## 2014-04-22 MED ORDER — MEDROXYPROGESTERONE ACETATE 150 MG/ML IM SUSP
150.0000 mg | Freq: Once | INTRAMUSCULAR | Status: AC
  Administered 2014-04-22: 150 mg via INTRAMUSCULAR

## 2014-04-22 MED ORDER — CYANOCOBALAMIN 1000 MCG/ML IJ SOLN
1000.0000 ug | Freq: Once | INTRAMUSCULAR | Status: AC
  Administered 2014-04-22: 1000 ug via INTRAMUSCULAR

## 2014-04-22 NOTE — Patient Instructions (Addendum)
You had the Tetanus (Tdap) shot today  You had the B12 and the depoprovera shots today  Please continue all other medications as before, and refills have been done if requested.  Please have the pharmacy call with any other refills you may need.  Please continue your efforts at being more active, low cholesterol diet, and weight control.  You are otherwise up to date with prevention measures today.  Please keep your appointments with your specialists as you may have planned  You will be contacted regarding the referral for: colonoscopy  Please go to the LAB in the Basement (turn left off the elevator) for the tests to be done today  You will be contacted by phone if any changes need to be made immediately.  Otherwise, you will receive a letter about your results with an explanation, but please check with MyChart first.  Please remember to sign up for MyChart if you have not done so, as this will be important to you in the future with finding out test results, communicating by private email, and scheduling acute appointments online when needed.  Please return in 1 year for your yearly visit, or sooner if needed

## 2014-04-22 NOTE — Progress Notes (Signed)
Pre visit review using our clinic review tool, if applicable. No additional management support is needed unless otherwise documented below in the visit note. 

## 2014-04-22 NOTE — Progress Notes (Signed)
Subjective:    Patient ID: Courtney [name], female    DOB: October 08, 1975, 39 y.o.   MRN: [id number]  HPI  Here for wellness and f/u;  Overall doing ok;  Pt denies CP, worsening SOB, DOE, wheezing, orthopnea, PND, worsening LE edema, palpitations, dizziness or syncope.  Pt denies neurological change such as new headache, facial or extremity weakness.  Pt denies polydipsia, polyuria, or low sugar symptoms. Pt states overall good compliance with treatment and medications, good tolerability, and has been trying to follow lower cholesterol diet.  Pt denies worsening depressive symptoms, suicidal ideation or panic. No fever, night sweats, wt loss, loss of appetite, or other constitutional symptoms.  Pt states fair ability with ADL's, has low fall risk, home safety reviewed and adequate, no other significant changes in hearing or vision, and only occasionally active with exercise.  Has had regular neurology f/u with Neurology since feb 2013 with abnormal MRI requiring IV solumedrol, and f/u PT, started Tecfidera July 2013 but stopped oct 2013 due to side effects, s/p appendix April 2014, had evidence for optic neuritis sept 2014, s/p CTS surgury LUE dec 2, re-started tecfidera jan 2015, s/p 3 days IV solumedrol feb 2015, MRI June 2015 c/w arthritis Left knee with ongoing pain, swelling, then most recently MRI aug 2015 per Dr Junius Roads c/w new incidental meningioma. Past Medical History  Diagnosis Date  . GERD (gastroesophageal reflux disease)   . Fibromyalgia   . Chronic fatigue   . CTS (carpal tunnel syndrome)   . Anemia, B12 deficiency   . Multiple sclerosis   . Hyperlipidemia   . C. difficile colitis   . Anxiety   . Heart murmur   . VITAMIN D DEFICIENCY 01/13/2010  . OBSTRUCTIVE SLEEP APNEA 12/08/2008  . Irritable bowel syndrome 05/24/2010  . HYPERLIPIDEMIA 03/13/2007  . ESSENTIAL HYPERTENSION 03/13/2007  . CARPAL TUNNEL SYNDROME, BILATERAL 01/13/2010  . ALLERGIC RHINITIS 03/13/2007  . Cervical lesion  07/13/2011    Spine lesion - indeterminate, for f/u MRI may 2013 per neurology  . Adenomatous polyp 04/22/2014   Past Surgical History  Procedure Laterality Date  . Carpal tunnel release  01/2010  . Colonoscopy w/ biopsies and polypectomy  05/2009    2 tubular adenomas  . Laparoscopic appendectomy N/A 07/23/2012    Procedure: APPENDECTOMY LAPAROSCOPIC;  Surgeon: Gayland Curry, MD;  Location: [facility];  Service: General;  Laterality: N/A;  . Appendectomy  07/23/12    reports that she has never smoked. She has never used smokeless tobacco. She reports that she does not drink alcohol or use illicit drugs. family history includes COPD in her father; Diabetes in her father and other; Heart attack in her maternal grandfather; Hypertension in her father and other; Lung cancer in her maternal grandmother; Migraines in her mother; Prostate cancer in her maternal uncle; Stroke in her maternal grandfather. There is no history of Colon cancer. Allergies  Allergen Reactions  . Ciprofloxacin In D5w Rash  . Penicillins     REACTION: rash   Current Outpatient Prescriptions on File Prior to Visit  Medication Sig Dispense Refill  . cetirizine (ZYRTEC) 10 MG tablet Take 10 mg by mouth daily.      . Cholecalciferol (VITAMIN D) 2000 UNITS CAPS Take 1 capsule by mouth every morning.    . cyanocobalamin (,VITAMIN B-12,) 1000 MCG/ML injection Inject 1,000 mcg into the muscle every 30 (thirty) days.    . furosemide (LASIX) 40 MG tablet TAKE 1 TABLET DAILY 90 tablet 1  .  gabapentin (NEURONTIN) 300 MG capsule Take 300 mg by mouth 3 (three) times daily.     Marland Kitchen KLOR-CON M10 10 MEQ tablet TAKE 1 TABLET DAILY 90 tablet 2  . medroxyPROGESTERone (DEPO-PROVERA) 150 MG/ML injection Inject 150 mg into the muscle every 3 (three) months.      . metoprolol (LOPRESSOR) 100 MG tablet Take 100 mg by mouth 2 (two) times daily.    . metoprolol (LOPRESSOR) 100 MG tablet TAKE 2 TABLETS DAILY 180 tablet 2  . nortriptyline (PAMELOR) 50 MG  capsule Take 100 mg by mouth at bedtime. At bedtime    . omeprazole-sodium bicarbonate (ZEGERID) 40-1100 MG per capsule Take 1 capsule by mouth daily.      . potassium chloride (K-DUR) 10 MEQ tablet TAKE 1 TABLET DAILY 90 tablet 3  . simvastatin (ZOCOR) 40 MG tablet TAKE 1 TABLET DAILY 90 tablet 1   No current facility-administered medications on file prior to visit.   Review of Systems Constitutional: Negative for increased diaphoresis, other activity, appetite or other siginficant weight change  HENT: Negative for worsening hearing loss, ear pain, facial swelling, mouth sores and neck stiffness.   Eyes: Negative for other worsening pain, redness or visual disturbance.  Respiratory: Negative for shortness of breath and wheezing.   Cardiovascular: Negative for chest pain and palpitations.  Gastrointestinal: Negative for diarrhea, blood in stool, abdominal distention or other pain Genitourinary: Negative for hematuria, flank pain or change in urine volume.  Musculoskeletal: Negative for myalgias or other joint complaints.  Skin: Negative for color change and wound.  Neurological: Negative for syncope and numbness. other than noted Hematological: Negative for adenopathy. or other swelling Psychiatric/Behavioral: Negative for hallucinations, self-injury, decreased concentration or other worsening agitation.      Objective:   Physical Exam BP 110/72 mmHg  Pulse 88  Temp(Src) 99.3 F (37.4 C) (Oral)  Ht 5\' 1"  (1.549 m)  Wt 219 lb 8 oz (99.565 kg)  BMI 41.50 kg/m2  SpO2 97% VS noted,  Constitutional: Pt is oriented to person, place, and time. Appears well-developed and well-nourished.  Head: Normocephalic and atraumatic.  Right Ear: External ear normal.  Left Ear: External ear normal.  Nose: Nose normal.  Mouth/Throat: Oropharynx is clear and moist.  Eyes: Conjunctivae and EOM are normal. Pupils are equal, round, and reactive to light.  Neck: Normal range of motion. Neck supple. No  JVD present. No tracheal deviation present.  Cardiovascular: Normal rate, regular rhythm, normal heart sounds and intact distal pulses.   Pulmonary/Chest: Effort normal and breath sounds without rales or wheezing  Abdominal: Soft. Bowel sounds are normal. NT. No HSM  Musculoskeletal: Normal range of motion. Exhibits no edema.  Lymphadenopathy:  Has no cervical adenopathy.  Neurological: Pt is alert and oriented to person, place, and time. Pt has normal reflexes. No cranial nerve deficit. Motor grossly intact Left knee with bonyd deg changes, no effusion but decreaed ROM Skin: Skin is warm and dry. No rash noted.  Psychiatric:  Has normal mood and affect. Behavior is normal.     Assessment & Plan:

## 2014-04-23 ENCOUNTER — Other Ambulatory Visit: Payer: Self-pay | Admitting: Internal Medicine

## 2014-04-23 ENCOUNTER — Encounter: Payer: Self-pay | Admitting: Internal Medicine

## 2014-04-23 ENCOUNTER — Telehealth: Payer: Self-pay

## 2014-04-23 MED ORDER — FENOFIBRATE 145 MG PO TABS: 145.0000 mg | ORAL_TABLET | Freq: Every day | ORAL | Status: DC

## 2014-04-23 NOTE — Telephone Encounter (Signed)
Patient has been informed of her lab results and PCP instructions from 04/22/14 appt.

## 2014-04-24 DIAGNOSIS — E538 Deficiency of other specified B group vitamins: Secondary | ICD-10-CM | POA: Insufficient documentation

## 2014-04-24 NOTE — Assessment & Plan Note (Signed)
For f/u colonoscopy 

## 2014-04-24 NOTE — Assessment & Plan Note (Signed)
Also for depomedrol IM

## 2014-04-24 NOTE — Assessment & Plan Note (Signed)

## 2014-04-24 NOTE — Assessment & Plan Note (Signed)
For B12 IM replacement today

## 2014-04-24 NOTE — Assessment & Plan Note (Signed)
To cont statin, f/u lipids,  to f/u any worsening symptoms or concerns

## 2014-05-15 ENCOUNTER — Encounter: Payer: Self-pay | Admitting: Internal Medicine

## 2014-05-15 MED ORDER — GEMFIBROZIL 600 MG PO TABS: 600.0000 mg | ORAL_TABLET | Freq: Two times a day (BID) | ORAL | Status: DC

## 2014-05-27 ENCOUNTER — Encounter: Payer: Self-pay | Admitting: Internal Medicine

## 2014-05-27 MED ORDER — GEMFIBROZIL 600 MG PO TABS: 600.0000 mg | ORAL_TABLET | Freq: Two times a day (BID) | ORAL | Status: DC

## 2014-06-20 ENCOUNTER — Encounter: Payer: Self-pay | Admitting: Internal Medicine

## 2014-07-06 ENCOUNTER — Other Ambulatory Visit: Payer: Self-pay | Admitting: Internal Medicine

## 2014-07-07 ENCOUNTER — Encounter: Payer: Self-pay | Admitting: Internal Medicine

## 2014-07-07 ENCOUNTER — Other Ambulatory Visit: Payer: Self-pay | Admitting: Internal Medicine

## 2014-07-09 DIAGNOSIS — G35 Multiple sclerosis: Secondary | ICD-10-CM | POA: Diagnosis not present

## 2014-07-09 DIAGNOSIS — M179 Osteoarthritis of knee, unspecified: Secondary | ICD-10-CM | POA: Diagnosis not present

## 2014-07-09 DIAGNOSIS — Z880 Allergy status to penicillin: Secondary | ICD-10-CM | POA: Diagnosis not present

## 2014-07-09 DIAGNOSIS — H469 Unspecified optic neuritis: Secondary | ICD-10-CM | POA: Diagnosis not present

## 2014-07-09 DIAGNOSIS — Z881 Allergy status to other antibiotic agents status: Secondary | ICD-10-CM | POA: Diagnosis not present

## 2014-07-09 DIAGNOSIS — Z79899 Other long term (current) drug therapy: Secondary | ICD-10-CM | POA: Diagnosis not present

## 2014-07-22 ENCOUNTER — Ambulatory Visit (INDEPENDENT_AMBULATORY_CARE_PROVIDER_SITE_OTHER): Payer: Medicare Other

## 2014-07-22 DIAGNOSIS — Z308 Encounter for other contraceptive management: Secondary | ICD-10-CM | POA: Diagnosis not present

## 2014-07-22 DIAGNOSIS — E538 Deficiency of other specified B group vitamins: Secondary | ICD-10-CM | POA: Diagnosis not present

## 2014-07-22 MED ORDER — MEDROXYPROGESTERONE ACETATE 150 MG/ML IM SUSP
150.0000 mg | Freq: Once | INTRAMUSCULAR | Status: AC
  Administered 2014-07-22: 150 mg via INTRAMUSCULAR

## 2014-07-22 MED ORDER — CYANOCOBALAMIN 1000 MCG/ML IJ SOLN
1000.0000 ug | Freq: Once | INTRAMUSCULAR | Status: AC
  Administered 2014-07-22: 1000 ug via INTRAMUSCULAR

## 2014-08-11 ENCOUNTER — Inpatient Hospital Stay (HOSPITAL_COMMUNITY)
Admission: EM | Admit: 2014-08-11 | Discharge: 2014-08-12 | DRG: 639 | Disposition: A | Discharge status: Home / Self Care | Payer: Medicare Other | Attending: Internal Medicine | Admitting: Internal Medicine

## 2014-08-11 ENCOUNTER — Ambulatory Visit (INDEPENDENT_AMBULATORY_CARE_PROVIDER_SITE_OTHER): Payer: Medicare Other | Admitting: Internal Medicine

## 2014-08-11 ENCOUNTER — Encounter (HOSPITAL_COMMUNITY): Payer: Self-pay

## 2014-08-11 ENCOUNTER — Encounter: Payer: Self-pay | Admitting: Internal Medicine

## 2014-08-11 VITALS — BP 112/88 | HR 93 | Temp 98.4°F | Resp 16 | Wt 198.0 lb

## 2014-08-11 DIAGNOSIS — E1165 Type 2 diabetes mellitus with hyperglycemia: Principal | ICD-10-CM | POA: Diagnosis present

## 2014-08-11 DIAGNOSIS — G4733 Obstructive sleep apnea (adult) (pediatric): Secondary | ICD-10-CM | POA: Diagnosis present

## 2014-08-11 DIAGNOSIS — E781 Pure hyperglyceridemia: Secondary | ICD-10-CM | POA: Diagnosis present

## 2014-08-11 DIAGNOSIS — Z801 Family history of malignant neoplasm of trachea, bronchus and lung: Secondary | ICD-10-CM

## 2014-08-11 DIAGNOSIS — K219 Gastro-esophageal reflux disease without esophagitis: Secondary | ICD-10-CM | POA: Diagnosis present

## 2014-08-11 DIAGNOSIS — R110 Nausea: Secondary | ICD-10-CM | POA: Diagnosis not present

## 2014-08-11 DIAGNOSIS — R739 Hyperglycemia, unspecified: Secondary | ICD-10-CM

## 2014-08-11 DIAGNOSIS — I10 Essential (primary) hypertension: Secondary | ICD-10-CM

## 2014-08-11 DIAGNOSIS — Z833 Family history of diabetes mellitus: Secondary | ICD-10-CM

## 2014-08-11 DIAGNOSIS — M797 Fibromyalgia: Secondary | ICD-10-CM | POA: Diagnosis present

## 2014-08-11 DIAGNOSIS — E119 Type 2 diabetes mellitus without complications: Secondary | ICD-10-CM | POA: Diagnosis not present

## 2014-08-11 DIAGNOSIS — Z825 Family history of asthma and other chronic lower respiratory diseases: Secondary | ICD-10-CM | POA: Diagnosis not present

## 2014-08-11 DIAGNOSIS — G35 Multiple sclerosis: Secondary | ICD-10-CM | POA: Diagnosis not present

## 2014-08-11 DIAGNOSIS — Z79899 Other long term (current) drug therapy: Secondary | ICD-10-CM

## 2014-08-11 DIAGNOSIS — K580 Irritable bowel syndrome with diarrhea: Secondary | ICD-10-CM | POA: Diagnosis present

## 2014-08-11 DIAGNOSIS — E785 Hyperlipidemia, unspecified: Secondary | ICD-10-CM

## 2014-08-11 DIAGNOSIS — Z8249 Family history of ischemic heart disease and other diseases of the circulatory system: Secondary | ICD-10-CM | POA: Diagnosis not present

## 2014-08-11 DIAGNOSIS — R7302 Impaired glucose tolerance (oral): Secondary | ICD-10-CM

## 2014-08-11 DIAGNOSIS — K589 Irritable bowel syndrome without diarrhea: Secondary | ICD-10-CM | POA: Diagnosis present

## 2014-08-11 LAB — CBC
HCT: 42.5 % (ref 36.0–46.0)
HEMOGLOBIN: 14.4 g/dL (ref 12.0–15.0)
MCH: 28.9 pg (ref 26.0–34.0)
MCHC: 33.9 g/dL (ref 30.0–36.0)
MCV: 85.3 fL (ref 78.0–100.0)
Platelets: 336 10*3/uL (ref 150–400)
RBC: 4.98 MIL/uL (ref 3.87–5.11)
RDW: 14.9 % (ref 11.5–15.5)
WBC: 5.9 10*3/uL (ref 4.0–10.5)

## 2014-08-11 LAB — URINALYSIS, ROUTINE W REFLEX MICROSCOPIC
Bilirubin Urine: NEGATIVE
HGB URINE DIPSTICK: NEGATIVE
KETONES UR: NEGATIVE mg/dL
Leukocytes, UA: NEGATIVE
Nitrite: NEGATIVE
PH: 5.5 (ref 5.0–8.0)
Protein, ur: NEGATIVE mg/dL
Specific Gravity, Urine: 1.027 (ref 1.005–1.030)
UROBILINOGEN UA: 0.2 mg/dL (ref 0.0–1.0)

## 2014-08-11 LAB — GLUCOSE, CAPILLARY
Glucose-Capillary: 175 mg/dL — ABNORMAL HIGH (ref 70–99)
Glucose-Capillary: 305 mg/dL — ABNORMAL HIGH (ref 70–99)

## 2014-08-11 LAB — COMPREHENSIVE METABOLIC PANEL
ALT: 22 U/L (ref 0–35)
AST: 27 U/L (ref 0–37)
Albumin: 5.2 g/dL (ref 3.5–5.2)
Alkaline Phosphatase: 99 U/L (ref 39–117)
Anion gap: 17 — ABNORMAL HIGH (ref 5–15)
BUN: 12 mg/dL (ref 6–23)
CO2: 23 mmol/L (ref 19–32)
Calcium: 10.3 mg/dL (ref 8.4–10.5)
Chloride: 96 mmol/L (ref 96–112)
Creatinine, Ser: 0.87 mg/dL (ref 0.50–1.10)
GFR, EST NON AFRICAN AMERICAN: 83 mL/min — AB (ref >90)
Glucose, Bld: 504 mg/dL — ABNORMAL HIGH (ref 70–99)
POTASSIUM: 4 mmol/L (ref 3.5–5.1)
Sodium: 136 mmol/L (ref 135–145)
Total Bilirubin: 0.8 mg/dL (ref 0.3–1.2)
Total Protein: 9.5 g/dL — ABNORMAL HIGH (ref 6.0–8.3)

## 2014-08-11 LAB — POC URINE PREG, ED: Preg Test, Ur: NEGATIVE

## 2014-08-11 LAB — URINE MICROSCOPIC-ADD ON

## 2014-08-11 LAB — CBG MONITORING, ED
GLUCOSE-CAPILLARY: 306 mg/dL — AB (ref 70–99)
Glucose-Capillary: 497 mg/dL — ABNORMAL HIGH (ref 70–99)
Glucose-Capillary: 257 mg/dL — ABNORMAL HIGH (ref 70–99)

## 2014-08-11 MED ORDER — SODIUM CHLORIDE 0.9 % IV BOLUS (SEPSIS)
1000.0000 mL | Freq: Once | INTRAVENOUS | Status: AC
  Administered 2014-08-11: 1000 mL via INTRAVENOUS

## 2014-08-11 MED ORDER — INSULIN ASPART 100 UNIT/ML ~~LOC~~ SOLN: 0.0000 [IU] | SUBCUTANEOUS | Status: DC

## 2014-08-11 MED ORDER — INSULIN DETEMIR 100 UNIT/ML ~~LOC~~ SOLN
20.0000 [IU] | Freq: Every day | SUBCUTANEOUS | Status: DC
  Administered 2014-08-12: 20 [IU] via SUBCUTANEOUS
  Filled 2014-08-11: qty 0.2

## 2014-08-11 MED ORDER — VITAMIN D3 25 MCG (1000 UNIT) PO TABS
2000.0000 [IU] | ORAL_TABLET | Freq: Every day | ORAL | Status: DC
  Administered 2014-08-12: 2000 [IU] via ORAL
  Filled 2014-08-11: qty 2

## 2014-08-11 MED ORDER — LIVING WELL WITH DIABETES BOOK
Freq: Once | Status: AC
  Administered 2014-08-12: 08:00:00
  Filled 2014-08-11: qty 1

## 2014-08-11 MED ORDER — VITAMIN D 50 MCG (2000 UT) PO CAPS: 1.0000 | ORAL_CAPSULE | Freq: Every morning | ORAL | Status: DC

## 2014-08-11 MED ORDER — GEMFIBROZIL 600 MG PO TABS
600.0000 mg | ORAL_TABLET | Freq: Two times a day (BID) | ORAL | Status: DC
  Administered 2014-08-11 – 2014-08-12 (×2): 600 mg via ORAL
  Filled 2014-08-11 (×4): qty 1

## 2014-08-11 MED ORDER — INSULIN DETEMIR 100 UNIT/ML ~~LOC~~ SOLN
20.0000 [IU] | Freq: Once | SUBCUTANEOUS | Status: AC
  Administered 2014-08-11: 20 [IU] via SUBCUTANEOUS
  Filled 2014-08-11: qty 0.2

## 2014-08-11 MED ORDER — METOPROLOL TARTRATE 100 MG PO TABS
200.0000 mg | ORAL_TABLET | Freq: Every day | ORAL | Status: DC
  Filled 2014-08-11: qty 2

## 2014-08-11 MED ORDER — METOPROLOL TARTRATE 100 MG PO TABS
100.0000 mg | ORAL_TABLET | Freq: Two times a day (BID) | ORAL | Status: DC
  Administered 2014-08-11 – 2014-08-12 (×2): 100 mg via ORAL
  Filled 2014-08-11 (×3): qty 1

## 2014-08-11 MED ORDER — INSULIN ASPART 100 UNIT/ML ~~LOC~~ SOLN
0.0000 [IU] | Freq: Every day | SUBCUTANEOUS | Status: DC
  Administered 2014-08-11: 4 [IU] via SUBCUTANEOUS

## 2014-08-11 MED ORDER — ENOXAPARIN SODIUM 40 MG/0.4ML ~~LOC~~ SOLN
40.0000 mg | SUBCUTANEOUS | Status: DC
  Administered 2014-08-11: 40 mg via SUBCUTANEOUS
  Filled 2014-08-11 (×2): qty 0.4

## 2014-08-11 MED ORDER — LORATADINE 10 MG PO TABS
10.0000 mg | ORAL_TABLET | Freq: Every day | ORAL | Status: DC
  Administered 2014-08-12: 10 mg via ORAL
  Filled 2014-08-11: qty 1

## 2014-08-11 MED ORDER — PANTOPRAZOLE SODIUM 40 MG PO TBEC
40.0000 mg | DELAYED_RELEASE_TABLET | Freq: Every day | ORAL | Status: DC
  Administered 2014-08-12: 40 mg via ORAL
  Filled 2014-08-11 (×3): qty 1

## 2014-08-11 MED ORDER — SIMVASTATIN 40 MG PO TABS
40.0000 mg | ORAL_TABLET | Freq: Every day | ORAL | Status: DC
  Administered 2014-08-11 – 2014-08-12 (×2): 40 mg via ORAL
  Filled 2014-08-11 (×2): qty 1

## 2014-08-11 MED ORDER — INSULIN STARTER KIT- SYRINGES (ENGLISH)
1.0000 | Freq: Once | Status: AC
  Administered 2014-08-12: 1
  Filled 2014-08-11: qty 1

## 2014-08-11 MED ORDER — GABAPENTIN 300 MG PO CAPS
300.0000 mg | ORAL_CAPSULE | Freq: Three times a day (TID) | ORAL | Status: DC
  Administered 2014-08-11 – 2014-08-12 (×3): 300 mg via ORAL
  Filled 2014-08-11 (×6): qty 1

## 2014-08-11 MED ORDER — LOPERAMIDE HCL 2 MG PO CAPS
2.0000 mg | ORAL_CAPSULE | Freq: Every day | ORAL | Status: DC
  Administered 2014-08-12: 2 mg via ORAL
  Filled 2014-08-11 (×2): qty 1

## 2014-08-11 MED ORDER — DIMETHYL FUMARATE 240 MG PO CPDR: DELAYED_RELEASE_CAPSULE | Freq: Two times a day (BID) | ORAL | Status: DC

## 2014-08-11 MED ORDER — DEXTROSE-NACL 5-0.45 % IV SOLN: INTRAVENOUS | Status: DC

## 2014-08-11 MED ORDER — BACLOFEN 10 MG PO TABS
10.0000 mg | ORAL_TABLET | Freq: Every day | ORAL | Status: DC
  Administered 2014-08-11 – 2014-08-12 (×2): 10 mg via ORAL
  Filled 2014-08-11 (×2): qty 1

## 2014-08-11 MED ORDER — SODIUM CHLORIDE 0.9 % IV SOLN
INTRAVENOUS | Status: DC
  Administered 2014-08-11: 2.5 [IU]/h via INTRAVENOUS
  Filled 2014-08-11: qty 2.5

## 2014-08-11 MED ORDER — INSULIN ASPART 100 UNIT/ML ~~LOC~~ SOLN
0.0000 [IU] | Freq: Three times a day (TID) | SUBCUTANEOUS | Status: DC
  Administered 2014-08-11: 3 [IU] via SUBCUTANEOUS
  Administered 2014-08-12: 5 [IU] via SUBCUTANEOUS
  Administered 2014-08-12: 8 [IU] via SUBCUTANEOUS

## 2014-08-11 MED ORDER — NORTRIPTYLINE HCL 25 MG PO CAPS
100.0000 mg | ORAL_CAPSULE | Freq: Every day | ORAL | Status: DC
  Administered 2014-08-11: 100 mg via ORAL
  Filled 2014-08-11 (×2): qty 4

## 2014-08-11 NOTE — Progress Notes (Signed)
Report received from ED nurse.

## 2014-08-11 NOTE — ED Provider Notes (Signed)
CSN: [id number]     Arrival date & time 08/11/14  1158 History   First MD Initiated Contact with Patient 08/11/14 1202     Chief Complaint  Patient presents with  . Hyperglycemia  . Polyuria  . Nausea     (Consider location/radiation/quality/duration/timing/severity/associated sxs/prior Treatment) HPI  This is a 39 year old female with a history of fibromyalgia, multiple sclerosis, hyperlipidemia, hypertension who presents with new onset hyperglycemia. Patient reports that she was seen by her primary physician today. She was noted to have a CBG in the office of "almost 600." She reports over the last several weeks nausea without vomiting or diarrhea, polyuria, blurred vision and both eyes. She states "I just don't feel well." Denies any history of diabetes. Denies any abdominal pain, chest pain, shortness of breath, urinary symptoms.  Past Medical History  Diagnosis Date  . GERD (gastroesophageal reflux disease)   . Fibromyalgia   . Chronic fatigue   . CTS (carpal tunnel syndrome)   . Anemia, B12 deficiency   . Multiple sclerosis   . Hyperlipidemia   . C. difficile colitis   . Anxiety   . Heart murmur   . VITAMIN D DEFICIENCY 01/13/2010  . OBSTRUCTIVE SLEEP APNEA 12/08/2008  . Irritable bowel syndrome 05/24/2010  . HYPERLIPIDEMIA 03/13/2007  . ESSENTIAL HYPERTENSION 03/13/2007  . CARPAL TUNNEL SYNDROME, BILATERAL 01/13/2010  . ALLERGIC RHINITIS 03/13/2007  . Cervical lesion 07/13/2011    Spine lesion - indeterminate, for f/u MRI may 2013 per neurology  . Adenomatous polyp 04/22/2014   Past Surgical History  Procedure Laterality Date  . Carpal tunnel release  01/2010  . Colonoscopy w/ biopsies and polypectomy  05/2009    2 tubular adenomas  . Laparoscopic appendectomy N/A 07/23/2012    Procedure: APPENDECTOMY LAPAROSCOPIC;  Surgeon: Gayland Curry, MD;  Location: [facility];  Service: General;  Laterality: N/A;  . Appendectomy  07/23/12   Family History  Problem Relation Age of Onset  .  Migraines Mother   . Prostate cancer Maternal Uncle     prostate cancer  . Lung cancer Maternal Grandmother   . Stroke Maternal Grandfather   . Heart attack Maternal Grandfather   . Diabetes Other   . Hypertension Other   . Colon cancer Neg Hx   . Hypertension Father   . COPD Father   . Diabetes Father    History  Substance Use Topics  . Smoking status: Never Smoker   . Smokeless tobacco: Never Used  . Alcohol Use: No   OB History    No data available     Review of Systems  Constitutional: Negative for fever.  Respiratory: Negative for cough, chest tightness and shortness of breath.   Cardiovascular: Negative for chest pain.  Gastrointestinal: Positive for nausea. Negative for vomiting and abdominal pain.  Endocrine: Positive for polydipsia and polyuria.  Genitourinary: Negative for dysuria.  Musculoskeletal: Negative for back pain.  Neurological: Positive for weakness. Negative for headaches.  All other systems reviewed and are negative.     Allergies  Ciprofloxacin in d5w; Penicillins; and Tricor  Home Medications   Prior to Admission medications   Medication Sig Start Date End Date Taking? Authorizing Provider  baclofen (LIORESAL) 10 MG tablet Take 10 mg by mouth daily at 2 PM daily at 2 PM.   Yes Historical Provider, MD  cetirizine (ZYRTEC) 10 MG tablet Take 10 mg by mouth daily.     Yes Historical Provider, MD  Cholecalciferol (VITAMIN D) 2000 UNITS CAPS Take  1 capsule by mouth every morning.   Yes Historical Provider, MD  cyanocobalamin (,VITAMIN B-12,) 1000 MCG/ML injection Inject 1,000 mcg into the muscle every 30 (thirty) days.   Yes Historical Provider, MD  Dimethyl Fumarate (TECFIDERA) 240 MG CPDR Take by mouth 2 (two) times daily.   Yes Historical Provider, MD  furosemide (LASIX) 40 MG tablet TAKE 1 TABLET DAILY 07/07/14  Yes Biagio Borg, MD  gabapentin (NEURONTIN) 300 MG capsule Take 300 mg by mouth 3 (three) times daily.  05/09/12  Yes Historical  Provider, MD  gemfibrozil (LOPID) 600 MG tablet Take 1 tablet (600 mg total) by mouth 2 (two) times daily before a meal. 05/27/14  Yes Biagio Borg, MD  KLOR-CON M10 10 MEQ tablet TAKE 1 TABLET DAILY 12/11/13  Yes Biagio Borg, MD  loperamide (IMODIUM) 2 MG capsule Take 2 mg by mouth as needed for diarrhea or loose stools.   Yes Historical Provider, MD  medroxyPROGESTERone (DEPO-PROVERA) 150 MG/ML injection Inject 150 mg into the muscle every 3 (three) months.     Yes Historical Provider, MD  metoprolol (LOPRESSOR) 100 MG tablet TAKE 2 TABLETS DAILY 12/11/13  Yes Biagio Borg, MD  nortriptyline (PAMELOR) 50 MG capsule Take 100 mg by mouth at bedtime. At bedtime 04/01/11  Yes Biagio Borg, MD  omeprazole-sodium bicarbonate (ZEGERID) 40-1100 MG per capsule Take 1 capsule by mouth daily.     Yes Historical Provider, MD  potassium chloride (K-DUR) 10 MEQ tablet TAKE 1 TABLET DAILY 12/11/12  Yes Biagio Borg, MD  simvastatin (ZOCOR) 40 MG tablet Take 1 tablet by mouth daily 07/07/14  Yes Biagio Borg, MD   BP 145/98 mmHg  Pulse 91  Temp(Src) 98.2 F (36.8 C) (Oral)  Resp 16  SpO2 97% Physical Exam  Constitutional: She is oriented to person, place, and time. She appears well-developed and well-nourished. No distress.  Overweight  HENT:  Head: Normocephalic and atraumatic.  Mucous membranes dry  Neck: Neck supple.  Cardiovascular: Normal rate, regular rhythm and normal heart sounds.   No murmur heard. Pulmonary/Chest: Effort normal and breath sounds normal. No respiratory distress. She has no wheezes.  Abdominal: Soft. Bowel sounds are normal. There is no tenderness. There is no rebound.  Musculoskeletal: She exhibits edema.  Neurological: She is alert and oriented to person, place, and time.  Skin: Skin is warm and dry.  Psychiatric: She has a normal mood and affect.  Nursing note and vitals reviewed.   ED Course  Procedures (including critical care time) Labs Review Labs Reviewed   COMPREHENSIVE METABOLIC PANEL - Abnormal; Notable for the following:    Glucose, Bld 504 (*)    Total Protein 9.5 (*)    GFR calc non Af Amer 83 (*)    Anion gap 17 (*)    All other components within normal limits  URINALYSIS, ROUTINE W REFLEX MICROSCOPIC - Abnormal; Notable for the following:    APPearance CLOUDY (*)    Glucose, UA >1000 (*)    All other components within normal limits  URINE MICROSCOPIC-ADD ON - Abnormal; Notable for the following:    Squamous Epithelial / LPF FEW (*)    All other components within normal limits  CBG MONITORING, ED - Abnormal; Notable for the following:    Glucose-Capillary 497 (*)    All other components within normal limits  CBC  CBG MONITORING, ED  POC URINE PREG, ED  CBG MONITORING, ED  CBG MONITORING, ED  Imaging Review No results found.   EKG Interpretation None      MDM   Final diagnoses:  Diabetes mellitus, new onset    Patient presents with polydipsia, polyuria, and hyperglycemia. Nontoxic on exam. Exam is largely unremarkable. Patient's symptoms likely secondary to elevated blood sugars. Repeat blood sugar here 497.  No ketones noted in the urine; however, patient does have an anion gap of 17. Given this, will initiate fluids and glucose stabilizer. Given that this is new onset diabetes and patient will likely need insulin at home, will admit for glucose management, stabilization, and diabetes education.    Merryl Hacker, MD 08/11/14 1357

## 2014-08-11 NOTE — Assessment & Plan Note (Signed)
Her blood sugar is over 500 Will send to the ER for emergent evaluation and treatment

## 2014-08-11 NOTE — Progress Notes (Signed)
Pt placed on CPAP qhs. Machine plugged into red outlet and Pt refused humidity.  Settings are automode 5-20 cm H2O via medium FFM.

## 2014-08-11 NOTE — Progress Notes (Signed)
   Subjective:    Patient ID: Courtney [name], female    DOB: June 24, 1975, 39 y.o.   MRN: [id number]  HPI Comments: New to me she complains of a 2 week history of nausea, LOA, blurred vision, fatigue, polydipsia, polyuria, and head pressure.     Review of Systems  Constitutional: Positive for activity change, appetite change and fatigue. Negative for fever, chills, diaphoresis and unexpected weight change.  HENT: Negative.   Eyes: Positive for visual disturbance. Negative for photophobia and redness.  Respiratory: Negative.   Cardiovascular: Negative.  Negative for chest pain, palpitations and leg swelling.  Gastrointestinal: Positive for nausea. Negative for vomiting, abdominal pain, diarrhea, constipation and rectal pain.  Endocrine: Positive for polydipsia and polyuria. Negative for polyphagia.  Genitourinary: Negative.   Musculoskeletal: Negative.  Negative for back pain and arthralgias.  Skin: Negative.  Negative for rash.  Allergic/Immunologic: Negative.   Neurological: Negative.   Hematological: Negative.  Negative for adenopathy. Does not bruise/bleed easily.  Psychiatric/Behavioral: Negative.        Objective:   Physical Exam  Constitutional: She is oriented to person, place, and time. She appears well-developed and well-nourished. She appears distressed.  HENT:  Head: Normocephalic and atraumatic.  Mouth/Throat: Oropharynx is clear and moist. No oropharyngeal exudate.  Eyes: Conjunctivae are normal. Right eye exhibits no discharge. Left eye exhibits no discharge. No scleral icterus.  Neck: Normal range of motion. Neck supple. No JVD present. No tracheal deviation present. No thyromegaly present.  Cardiovascular: Normal rate, regular rhythm, normal heart sounds and intact distal pulses.  Exam reveals no gallop and no friction rub.   No murmur heard. Pulmonary/Chest: Effort normal and breath sounds normal. No stridor. No respiratory distress. She has no wheezes. She has no  rales. She exhibits no tenderness.  Abdominal: Soft. Bowel sounds are normal. She exhibits no distension and no mass. There is no tenderness. There is no rebound and no guarding.  Musculoskeletal: Normal range of motion. She exhibits no edema or tenderness.  Lymphadenopathy:    She has no cervical adenopathy.  Neurological: She is oriented to person, place, and time.  Skin: Skin is warm and dry. No rash noted. She is not diaphoretic. No erythema. No pallor.  Vitals reviewed.    Lab Results  Component Value Date   WBC 5.3 04/22/2014   HGB 11.7* 04/22/2014   HCT 35.7* 04/22/2014   PLT 333.0 04/22/2014   GLUCOSE 95 04/22/2014   CHOL 210* 04/22/2014   TRIG * 04/22/2014    502.0 Triglyceride is over 400; calculations on Lipids are invalid.   HDL 33.10* 04/22/2014   LDLDIRECT 116.4 04/22/2014   ALT 17 04/22/2014   AST 16 04/22/2014   NA 137 04/22/2014   K 3.6 04/22/2014   CL 105 04/22/2014   CREATININE 0.7 04/22/2014   BUN 10 04/22/2014   CO2 26 04/22/2014   TSH 1.96 04/22/2014   INR 1.0 12/23/2010   HGBA1C 5.5 01/09/2012       Assessment & Plan:

## 2014-08-11 NOTE — ED Notes (Signed)
Pt sent by PCP after having a CBG of "almost 600."  C/o blurred vision, nausea, polyuria, and generally not feeling well x 2 weeks.  Pain score 5/10.  No Hx of DM.

## 2014-08-11 NOTE — H&P (Addendum)
[facility] History and Physical  Courtney [name] GDJ:[id number] DOB: [birth year] DOA: 08/11/2014   PCP: Cathlean Cower, MD    Chief Complaint: Sent from [place] office for hyperglycemia  HPI: Courtney [name] is a 39 y.o. female with past medical history of multiple sclerosis, GERD, fibromyalgia, irritable bowel syndrome with diarrhea, hypertriglyceridemia who has noticed blurred vision nocturia, dry mouth, nausea and fatigue for the past 2 weeks. She was very worried by the symptoms today especially her blurred vision and therefore went to her PCPs office where her sugar was found to be greater than 600. She was sent to the ER for further treatment. Glucose was noted to be 504. She was placed on glucose stabilizer and referred for admission. She states she has never been told that she has diabetes. She's never had gestational diabetes. She hasn't had any recent rapid weight gain. No symptoms of infection such as cough, cold, dysuria or fevers.   General: + anorexia, no fever, no weight loss Cardiac: Denies chest pain, syncope, palpitations, pedal edema  Respiratory: Denies cough, shortness of breath, wheezing GI: Denies severe indigestion/heartburn, abdominal pain, nausea, vomiting, + diarrhea (IBS), no constipation GU: Denies hematuria, incontinence, dysuria - has been getting up to urinate 4 x a night Musculoskeletal: Denies arthritis  Skin: Denies suspicious skin lesions Neurologic: Denies focal weakness or change in vision- has right hand tremor and numbness in right hand, right foot drop and numbness in leg Psychiatry: Denies depression + anxiety over past few days due to symptoms. Hematologic: no easy bruising or bleeding   Past Medical History  Diagnosis Date  . GERD (gastroesophageal reflux disease)   . Fibromyalgia   . Chronic fatigue   . Anemia, B12 deficiency   . Multiple sclerosis   . Hyperlipidemia   . C. difficile colitis   . Anxiety   . Heart murmur   . VITAMIN D  DEFICIENCY 01/13/2010  . OBSTRUCTIVE SLEEP APNEA 12/08/2008  . Irritable bowel syndrome 05/24/2010  . ESSENTIAL HYPERTENSION 03/13/2007  . CARPAL TUNNEL SYNDROME, BILATERAL 01/13/2010  . ALLERGIC RHINITIS 03/13/2007  . Cervical lesion 07/13/2011    Spine lesion - indeterminate, for f/u MRI may 2013 per neurology  . Adenomatous polyp 04/22/2014    Past Surgical History  Procedure Laterality Date  . Carpal tunnel release  01/2010  . Colonoscopy w/ biopsies and polypectomy  05/2009    2 tubular adenomas  . Laparoscopic appendectomy N/A 07/23/2012    Procedure: APPENDECTOMY LAPAROSCOPIC;  Surgeon: Gayland Curry, MD;  Location: [facility];  Service: General;  Laterality: N/A;  . Appendectomy  07/23/12    Social History: does not smoke or drink alcohol Lives at home with parents and children, on disability    Allergies  Allergen Reactions  . Ciprofloxacin In D5w Rash  . Penicillins     REACTION: rash  . Tricor [Fenofibrate] Rash    Family History  Problem Relation Age of Onset  . Migraines Mother   . Prostate cancer Maternal Uncle     prostate cancer  . Lung cancer Maternal Grandmother   . Stroke Maternal Grandfather   . Heart attack Maternal Grandfather   . Diabetes Other   . Hypertension Other   . Colon cancer Neg Hx   . Hypertension Father   . COPD Father   . Diabetes Father      Prior to Admission medications   Medication Sig Start Date End Date Taking? Authorizing Provider  baclofen (LIORESAL) 10 MG tablet Take 10  mg by mouth daily at 2 PM daily at 2 PM.   Yes Historical Provider, MD  cetirizine (ZYRTEC) 10 MG tablet Take 10 mg by mouth daily.     Yes Historical Provider, MD  Cholecalciferol (VITAMIN D) 2000 UNITS CAPS Take 1 capsule by mouth every morning.   Yes Historical Provider, MD  cyanocobalamin (,VITAMIN B-12,) 1000 MCG/ML injection Inject 1,000 mcg into the muscle every 30 (thirty) days.   Yes Historical Provider, MD  Dimethyl Fumarate (TECFIDERA) 240 MG CPDR Take by  mouth 2 (two) times daily.   Yes Historical Provider, MD  furosemide (LASIX) 40 MG tablet TAKE 1 TABLET DAILY 07/07/14  Yes Biagio Borg, MD  gabapentin (NEURONTIN) 300 MG capsule Take 300 mg by mouth 3 (three) times daily.  05/09/12  Yes Historical Provider, MD  gemfibrozil (LOPID) 600 MG tablet Take 1 tablet (600 mg total) by mouth 2 (two) times daily before a meal. 05/27/14  Yes Biagio Borg, MD  KLOR-CON M10 10 MEQ tablet TAKE 1 TABLET DAILY 12/11/13  Yes Biagio Borg, MD  loperamide (IMODIUM) 2 MG capsule Take 2 mg by mouth as needed for diarrhea or loose stools.   Yes Historical Provider, MD  medroxyPROGESTERone (DEPO-PROVERA) 150 MG/ML injection Inject 150 mg into the muscle every 3 (three) months.     Yes Historical Provider, MD  metoprolol (LOPRESSOR) 100 MG tablet TAKE 2 TABLETS DAILY 12/11/13  Yes Biagio Borg, MD  nortriptyline (PAMELOR) 50 MG capsule Take 100 mg by mouth at bedtime. At bedtime 04/01/11  Yes Biagio Borg, MD  omeprazole-sodium bicarbonate (ZEGERID) 40-1100 MG per capsule Take 1 capsule by mouth daily.     Yes Historical Provider, MD  potassium chloride (K-DUR) 10 MEQ tablet TAKE 1 TABLET DAILY 12/11/12  Yes Biagio Borg, MD  simvastatin (ZOCOR) 40 MG tablet Take 1 tablet by mouth daily 07/07/14  Yes Biagio Borg, MD     Physical Exam: Filed Vitals:   08/11/14 1207 08/11/14 1412  BP: 145/98 120/75  Pulse: 91 83  Temp: 98.2 F (36.8 C)   TempSrc: Oral   Resp: 16 17  SpO2: 97% 97%     General: AAO x 3, no acute distress, morbidly obese HEENT: Normocephalic and Atraumatic, Mucous membranes pink- oral mucosa dry                PERRLA; EOM intact; No scleral icterus,                 Nares: Patent, Oropharynx: Clear, Fair Dentition                 Neck: FROM, no cervical lymphadenopathy, thyromegaly, carotid bruit or JVD;  Breasts: deferred CHEST WALL: No tenderness  CHEST: Normal respiration, clear to auscultation bilaterally  HEART: Regular rate and rhythm; no  murmurs rubs or gallops  BACK: No kyphosis or scoliosis; no CVA tenderness  GI: Positive Bowel Sounds, soft, non-tender; no masses, no organomegaly Rectal Exam: deferred MSK: No cyanosis, clubbing, or edema Genitalia: not examined  SKIN:  no rash or ulceration  CNS: Alert and Oriented x 4, Nonfocal exam, CN 2-12 intact- mild tremor of right hand  Labs on Admission:  Basic Metabolic Panel:  Recent Labs Lab 08/11/14 1232  NA 136  K 4.0  CL 96  CO2 23  GLUCOSE 504*  BUN 12  CREATININE 0.87  CALCIUM 10.3   Liver Function Tests:  Recent Labs Lab 08/11/14 1232  AST 27  ALT 22  ALKPHOS 99  BILITOT 0.8  PROT 9.5*  ALBUMIN 5.2   No results for input(s): LIPASE, AMYLASE in the last 168 hours. No results for input(s): AMMONIA in the last 168 hours. CBC:  Recent Labs Lab 08/11/14 1232  WBC 5.9  HGB 14.4  HCT 42.5  MCV 85.3  PLT 336   Cardiac Enzymes: No results for input(s): CKTOTAL, CKMB, CKMBINDEX, TROPONINI in the last 168 hours.  BNP (last 3 results) No results for input(s): BNP in the last 8760 hours.  ProBNP (last 3 results) No results for input(s): PROBNP in the last 8760 hours.  CBG:  Recent Labs Lab 08/11/14 1206 08/11/14 1400 08/11/14 1504  GLUCAP 497* 306* 257*    Radiological Exams on Admission: No results found.   Assessment/Plan Principal Problem:   DM type 2 (diabetes mellitus, type 2), hyperglycemia - sugars now down to 200s with glucose infusion - will transition from insulin infusion to s/c Levemir 20 U and moderate dose sliding scale Novolog with bedtime coverage - consult dietician, diabetes coordinator and will ask RN to teach patient how to inject insulin - HbA1c ordered  Active Problems:   Obstructive sleep apnea - will order CPAP at bedtime    Multiple sclerosis - cont Tecfidera and Baclofen    Essential hypertension -cont  Metoprolol- hold Lasix and Kdur    Irritable bowel syndrome - takes Lomotil  daily  Fibromyalgia - cont Neurontin and nortriptyline  GERD - takes Zegrid at home- will give Protonix     Consulted:   Code Status: Full code  Family Communication:   DVT Prophylaxis:Lovenox  Time spent: 8 min  [place], MD [facility]  If 7PM-7AM, please contact night-coverage [email or web address] 08/11/2014, 3:51 PM

## 2014-08-11 NOTE — Progress Notes (Signed)
Pre visit review using our clinic review tool, if applicable. No additional management support is needed unless otherwise documented below in the visit note. 

## 2014-08-11 NOTE — Progress Notes (Signed)
Inpatient Diabetes Program Recommendations  AACE/ADA: New Consensus Statement on Inpatient Glycemic Control (2013)  Target Ranges:  Prepandial:   less than 140 mg/dL      Peak postprandial:   less than 180 mg/dL (1-2 hours)      Critically ill patients:  140 - 180 mg/dL   Reason for Visit: Diabetes Consult  Diabetes history: None Outpatient Diabetes medications: N/A Current orders for Inpatient glycemic control: Levemir 20 units QD, Novolog moderate tidwc and hs  39 year old female with a history of fibromyalgia, multiple sclerosis, hyperlipidemia, hypertension who presents with new onset hyperglycemia. Patient reports that she was seen by her primary physician today. She was noted to have a CBG in the office of "almost 600." She reports over the last several weeks nausea without vomiting or diarrhea, polyuria, blurred vision and both eyes. She states "I just don't feel well." Denies any history of diabetes.  Results for Courtney, [name] (MRN [id number]) as of 08/11/2014 18:10  Ref. Range 08/11/2014 12:06 08/11/2014 14:00 08/11/2014 15:04 08/11/2014 16:24  Glucose-Capillary Latest Ref Range: 70-99 mg/dL 497 (H) 306 (H) 257 (H) 175 (H)  Results for Courtney, [name] (MRN [id number]) as of 08/11/2014 18:10  Ref. Range 08/11/2014 12:32  Sodium Latest Ref Range: 135-145 mmol/L 136  Potassium Latest Ref Range: 3.5-5.1 mmol/L 4.0  Chloride Latest Ref Range: 96-112 mmol/L 96  CO2 Latest Ref Range: 19-32 mmol/L 23  BUN Latest Ref Range: 6-23 mg/dL 12  Creatinine Latest Ref Range: 0.50-1.10 mg/dL 0.87  Calcium Latest Ref Range: 8.4-10.5 mg/dL 10.3  EGFR (Non-African Amer.) Latest Ref Range: >90 mL/min 83 (L)  EGFR (African American) Latest Ref Range: >90 mL/min >90  Glucose Latest Ref Range: 70-99 mg/dL 504 (H)  Anion gap Latest Ref Range: 5-15  17 (H)   HgbA1C pending. New diagnosis of DM.  Will likely need meal coverage insulin. Recommendations: Add Novolog 4 units tidwc for meal coverage insulin  if pt eats >50% meal. Will order insulin starter kit and RN to begin teaching insulin administration. Will order Living Well With Diabetes book. Encourage pt to view diabetes videos on pt ed channel. Will order OP Diabetes Education consult for newly-diagnosed DM.  Will f/u with pt in am to discuss diabets management. Thank you. Lorenda Peck, RD, LDN, CDE Inpatient Diabetes Coordinator [phone]

## 2014-08-12 LAB — BASIC METABOLIC PANEL
Anion gap: 9 (ref 5–15)
BUN: 10 mg/dL (ref 6–23)
CALCIUM: 8.7 mg/dL (ref 8.4–10.5)
CHLORIDE: 105 mmol/L (ref 96–112)
CO2: 23 mmol/L (ref 19–32)
CREATININE: 0.75 mg/dL (ref 0.50–1.10)
GFR calc non Af Amer: >90 mL/min (ref >90)
GLUCOSE: 277 mg/dL — AB (ref 70–99)
Potassium: 3.6 mmol/L (ref 3.5–5.1)
Sodium: 137 mmol/L (ref 135–145)

## 2014-08-12 LAB — GLUCOSE, CAPILLARY
GLUCOSE-CAPILLARY: 171 mg/dL — AB (ref 70–99)
Glucose-Capillary: 264 mg/dL — ABNORMAL HIGH (ref 70–99)
Glucose-Capillary: 227 mg/dL — ABNORMAL HIGH (ref 70–99)

## 2014-08-12 LAB — HEMOGLOBIN A1C
Hgb A1c MFr Bld: 9 % — ABNORMAL HIGH (ref 4.8–5.6)
Mean Plasma Glucose: 212 mg/dL

## 2014-08-12 MED ORDER — INSULIN LISPRO 100 UNIT/ML ~~LOC~~ SOLN
2.0000 [IU] | Freq: Three times a day (TID) | SUBCUTANEOUS | Status: DC
Start: 2014-08-12 — End: 2014-09-16

## 2014-08-12 MED ORDER — INSULIN GLARGINE 100 UNIT/ML ~~LOC~~ SOLN: 20.0000 [IU] | Freq: Every day | SUBCUTANEOUS | Status: DC

## 2014-08-12 MED ORDER — INSULIN ASPART 100 UNIT/ML ~~LOC~~ SOLN
4.0000 [IU] | Freq: Three times a day (TID) | SUBCUTANEOUS | Status: DC
  Administered 2014-08-12: 4 [IU] via SUBCUTANEOUS

## 2014-08-12 MED ORDER — "INSULIN SYRINGE 28G X 1/2"" 0.5 ML MISC": 1.0000 | Freq: Three times a day (TID) | Status: DC

## 2014-08-12 MED ORDER — FREESTYLE SYSTEM KIT: 1.0000 | PACK | Status: DC | PRN

## 2014-08-12 NOTE — Progress Notes (Signed)
Insulin teaching complete. Pt able to draw up her own insulin and administer as called for by her SSI table teach back method used. All questions and concerns have been addressed. Pt is alert and oriented times 4, VSS, skin intact, blood sugars are controlled, no c/o pain, no signs or symptoms of distress or discomfort at this time. Pt waiting on her ride home to arrive. Will continue to monitor until pt has left the floor.

## 2014-08-12 NOTE — Discharge Summary (Signed)
Physician Discharge Summary  Courtney [name] [facility] EHO:[id number] DOB: 05/23/1975 DOA: 08/11/2014  PCP: Cathlean Cower, MD  Admit date: 08/11/2014 Discharge date: 08/12/2014  Time spent: 55 minutes  Recommendations for Outpatient Follow-up:  1. outpt diabetes education consulted - recommend endocrine referral  Discharge Condition: stable Diet recommendation: carb modified, low sodium, heart healthy  Discharge Diagnoses:  Principal Problem:   DM type 2 (diabetes mellitus, type 2) Active Problems:   Obstructive sleep apnea   Multiple sclerosis   Essential hypertension   Irritable bowel syndrome   History of present illness:  Courtney [name] is a 39 y.o. female with past medical history of multiple sclerosis, GERD, fibromyalgia, irritable bowel syndrome with diarrhea, hypertriglyceridemia who has noticed blurred vision nocturia, dry mouth, nausea and fatigue for the past 2 weeks. She was very worried by the symptoms today especially her blurred vision and therefore went to her PCPs office where her sugar was found to be greater than 600. She was sent to the ER for further treatment. Glucose was noted to be 504. She was placed on glucose stabilizer and referred for admission. She states she has never been told that she has diabetes. She's never had gestational diabetes. She hasn't had any recent rapid weight gain. No symptoms of infection such as cough, cold, dysuria or fevers  Hospital Course:  Principal Problem:  DM type 2 (diabetes mellitus, type 2), hyperglycemia - sugars now down to 200s with addition of Levemir and Novolog sliding scale - will continue Levemir 20 U daily and moderate dose sliding scale Novolog with bedtime coverage - consulted dietician and diabetes coordinator to assist with teaching- RN has taught patient how to inject insulin - HbA1c found to be 9.0 - educated on weight loss and lifestyle changes  Active Problems:  Obstructive sleep apnea - will order CPAP at  bedtime   Multiple sclerosis - cont Tecfidera and Baclofen   Essential hypertension -cont Metoprolol- held Lasix and Kdur today- can resume tomorrow   Irritable bowel syndrome - takes Lomotil daily  Fibromyalgia - cont Neurontin and nortriptyline  GERD - takes Zegrid at home-    Discharge Exam: Filed Weights   08/11/14 1556  Weight: 89.812 kg (198 lb)   Filed Vitals:   08/12/14 1344  BP: 130/78  Pulse: 80  Temp: 98.4 F (36.9 C)  Resp: 16    General: AAO x 3, no distress Cardiovascular: RRR, no murmurs  Respiratory: clear to auscultation bilaterally GI: soft, non-tender, non-distended, bowel sound positive  Discharge Instructions You were cared for by a hospitalist during your hospital stay. If you have any questions about your discharge medications or the care you received while you were in the hospital after you are discharged, you can call the unit and asked to speak with the hospitalist on call if the hospitalist that took care of you is not available. Once you are discharged, your primary care physician will handle any further medical issues. Please note that NO REFILLS for any discharge medications will be authorized once you are discharged, as it is imperative that you return to your primary care physician (or establish a relationship with a primary care physician if you do not have one) for your aftercare needs so that they can reassess your need for medications and monitor your lab values.      Discharge Instructions    Ambulatory referral to Nutrition and Diabetic Education    Complete by:  As directed      Discharge instructions  Complete by:  As directed   Low sodium heart healthy and diabetic diet     Increase activity slowly    Complete by:  As directed             Medication List    STOP taking these medications        potassium chloride 10 MEQ tablet  Commonly known as:  K-DUR     simvastatin 40 MG tablet  Commonly known as:  ZOCOR       TAKE these medications        baclofen 10 MG tablet  Commonly known as:  LIORESAL  Take 10 mg by mouth daily at 2 PM daily at 2 PM.     cetirizine 10 MG tablet  Commonly known as:  ZYRTEC  Take 10 mg by mouth daily.     cyanocobalamin 1000 MCG/ML injection  Commonly known as:  (VITAMIN B-12)  Inject 1,000 mcg into the muscle every 30 (thirty) days.     DEPO-PROVERA 150 MG/ML injection  Generic drug:  medroxyPROGESTERone  Inject 150 mg into the muscle every 3 (three) months.     furosemide 40 MG tablet  Commonly known as:  LASIX  TAKE 1 TABLET DAILY     gabapentin 300 MG capsule  Commonly known as:  NEURONTIN  Take 300 mg by mouth 3 (three) times daily.     gemfibrozil 600 MG tablet  Commonly known as:  LOPID  Take 1 tablet (600 mg total) by mouth 2 (two) times daily before a meal.     insulin glargine 100 UNIT/ML injection  Commonly known as:  LANTUS  Inject 0.2 mLs (20 Units total) into the skin daily.     insulin lispro 100 UNIT/ML injection  Commonly known as:  HUMALOG  Inject 0.02-0.14 mLs (2-14 Units total) into the skin 4 (four) times daily -  before meals and at bedtime.     INSULIN SYRINGE .5CC/28G 28G X 1/2" 0.5 ML Misc  1 each by Does not apply route 4 (four) times daily -  before meals and at bedtime.     KLOR-CON M10 10 MEQ tablet  Generic drug:  potassium chloride  TAKE 1 TABLET DAILY     loperamide 2 MG capsule  Commonly known as:  IMODIUM  Take 2 mg by mouth as needed for diarrhea or loose stools.     metoprolol 100 MG tablet  Commonly known as:  LOPRESSOR  TAKE 2 TABLETS DAILY     nortriptyline 50 MG capsule  Commonly known as:  PAMELOR  Take 100 mg by mouth at bedtime. At bedtime     omeprazole-sodium bicarbonate 40-1100 MG per capsule  Commonly known as:  ZEGERID  Take 1 capsule by mouth daily.     TECFIDERA 240 MG Cpdr  Generic drug:  Dimethyl Fumarate  Take by mouth 2 (two) times daily.     Vitamin D 2000 UNITS Caps  Take 1  capsule by mouth every morning.       Allergies  Allergen Reactions  . Ciprofloxacin In D5w Rash  . Penicillins     REACTION: rash  . Tricor [Fenofibrate] Rash      The results of significant diagnostics from this hospitalization (including imaging, microbiology, ancillary and laboratory) are listed below for reference.    Significant Diagnostic Studies: No results found.  Microbiology: No results found for this or any previous visit (from the past 240 hour(s)).   Labs: Basic Metabolic Panel:  Recent  Labs Lab 08/11/14 1232 08/12/14 0440  NA 136 137  K 4.0 3.6  CL 96 105  CO2 23 23  GLUCOSE 504* 277*  BUN 12 10  CREATININE 0.87 0.75  CALCIUM 10.3 8.7   Liver Function Tests:  Recent Labs Lab 08/11/14 1232  AST 27  ALT 22  ALKPHOS 99  BILITOT 0.8  PROT 9.5*  ALBUMIN 5.2   No results for input(s): LIPASE, AMYLASE in the last 168 hours. No results for input(s): AMMONIA in the last 168 hours. CBC:  Recent Labs Lab 08/11/14 1232  WBC 5.9  HGB 14.4  HCT 42.5  MCV 85.3  PLT 336   Cardiac Enzymes: No results for input(s): CKTOTAL, CKMB, CKMBINDEX, TROPONINI in the last 168 hours. BNP: BNP (last 3 results) No results for input(s): BNP in the last 8760 hours.  ProBNP (last 3 results) No results for input(s): PROBNP in the last 8760 hours.  CBG:  Recent Labs Lab 08/11/14 1624 08/11/14 1747 08/11/14 2138 08/12/14 0734 08/12/14 1232  GLUCAP 175* 171* 305* 264* 227*       SignedDebbe Odea, MD [facility] 08/12/2014, 2:51 PM

## 2014-08-12 NOTE — Plan of Care (Signed)
Problem: Food- and Nutrition-Related Knowledge Deficit (NB-1.1) Goal: Nutrition education Formal process to instruct or train a patient/client in a skill or to impart knowledge to help patients/clients voluntarily manage or modify food choices and eating behavior to maintain or improve health. Outcome: Completed/Met Date Met:  08/12/14  RD consulted for nutrition education regarding diabetes.     Lab Results  Component Value Date    HGBA1C 9.0* 08/11/2014    RD provided "Carbohydrate Counting for People with Diabetes", "Heart Healthy Label Tips" handout from the Academy of Nutrition and Dietetics and "Plate Method" handout. Discussed different food groups and their effects on blood sugar, emphasizing carbohydrate-containing foods. Provided list of carbohydrates and recommended serving sizes of common foods.  Discussed importance of controlled and consistent carbohydrate intake throughout the day. Provided examples of ways to balance meals/snacks and encouraged intake of high-fiber, whole grain complex carbohydrates. Teach back method used.  Expect good compliance.  Body mass index is 37.43 kg/(m^2). Pt meets criteria for obesity based on current BMI.  Current diet order is CHO/Heart Healthy, patient is consuming approximately 100% of meals at this time. Labs and medications reviewed. No further nutrition interventions warranted at this time. RD contact information provided. If additional nutrition issues arise, please re-consult RD.  Clayton Bibles, MS, RD, LDN Pager: [phone] After Hours Pager: [phone]

## 2014-08-15 ENCOUNTER — Ambulatory Visit (INDEPENDENT_AMBULATORY_CARE_PROVIDER_SITE_OTHER): Payer: Medicare Other | Admitting: Internal Medicine

## 2014-08-15 ENCOUNTER — Encounter: Payer: Self-pay | Admitting: Internal Medicine

## 2014-08-15 VITALS — BP 114/80 | HR 99 | Temp 98.8°F | Resp 18 | Ht 61.0 in | Wt 198.1 lb

## 2014-08-15 DIAGNOSIS — I10 Essential (primary) hypertension: Secondary | ICD-10-CM | POA: Diagnosis not present

## 2014-08-15 DIAGNOSIS — E119 Type 2 diabetes mellitus without complications: Secondary | ICD-10-CM

## 2014-08-15 DIAGNOSIS — E785 Hyperlipidemia, unspecified: Secondary | ICD-10-CM | POA: Diagnosis not present

## 2014-08-15 NOTE — Assessment & Plan Note (Signed)
stable overall by history and exam, recent data reviewed with pt, and pt to continue medical treatment as before,  to f/u any worsening symptoms or concerns, for f/u lipid after better Dm control Lab Results  Component Value Date   CHOL 210* 04/22/2014   HDL 33.10* 04/22/2014   LDLDIRECT 116.4 04/22/2014   TRIG * 04/22/2014    502.0 Triglyceride is over 400; calculations on Lipids are invalid.   CHOLHDL 6 04/22/2014

## 2014-08-15 NOTE — Assessment & Plan Note (Signed)
Mild uncontrolled, for increaed lantus to 24 units, refer DM education, refer endocrinology per pt request

## 2014-08-15 NOTE — Assessment & Plan Note (Signed)
stable overall by history and exam, recent data reviewed with pt, and pt to continue medical treatment as before,  to f/u any worsening symptoms or concerns BP Readings from Last 3 Encounters:  08/15/14 114/80  08/12/14 130/78  08/11/14 112/88

## 2014-08-15 NOTE — Progress Notes (Signed)
Pre visit review using our clinic review tool, if applicable. No additional management support is needed unless otherwise documented below in the visit note. 

## 2014-08-15 NOTE — Patient Instructions (Signed)
Ok to increase the lantus to 24 units per day  OK to stay off the simvastatin for now  Please continue all other medications as before, and refills have been done if requested.  Please have the pharmacy call with any other refills you may need.  Please continue your efforts at being more active, low cholesterol diet, and weight control.  You will be contacted regarding the referral for: endocrinology  Please keep your appointments with your specialists as you may have planned - Diabetes education

## 2014-08-15 NOTE — Progress Notes (Signed)
Subjective:    Patient ID: Courtney [name], female    DOB: 07-07-1975, 39 y.o.   MRN: [id number]  HPI  Here to f/u recent hospn withj hyperglycemia, started insulin intensive tx, eyesight clearing now. Has not yet seen DM education.  Stopped statin in favor of lopid new start.  CBG's in low 200's. Pt denies chest pain, increased sob or doe, wheezing, orthopnea, PND, increased LE swelling, palpitations, dizziness or syncope.  Pt denies new neurological symptoms such as new headache, or facial or extremity weakness or numbness   Pt denies polydipsia, polyuria.  Plans to be more active with exercise, wt loss. Currently on lantus 20 units, and SSI Humalog; taking three times per day, for DM educatin soon to be contacted.   Wt Readings from Last 3 Encounters:  08/15/14 198 lb 1.9 oz (89.867 kg)  08/11/14 198 lb (89.812 kg)  08/11/14 198 lb (89.812 kg)   Past Medical History  Diagnosis Date  . GERD (gastroesophageal reflux disease)   . Fibromyalgia   . Chronic fatigue   . CTS (carpal tunnel syndrome)   . Anemia, B12 deficiency   . Multiple sclerosis   . Hyperlipidemia   . C. difficile colitis   . Anxiety   . Heart murmur   . VITAMIN D DEFICIENCY 01/13/2010  . OBSTRUCTIVE SLEEP APNEA 12/08/2008  . Irritable bowel syndrome 05/24/2010  . HYPERLIPIDEMIA 03/13/2007  . ESSENTIAL HYPERTENSION 03/13/2007  . CARPAL TUNNEL SYNDROME, BILATERAL 01/13/2010  . ALLERGIC RHINITIS 03/13/2007  . Cervical lesion 07/13/2011    Spine lesion - indeterminate, for f/u MRI may 2013 per neurology  . Adenomatous polyp 04/22/2014   Past Surgical History  Procedure Laterality Date  . Carpal tunnel release  01/2010  . Colonoscopy w/ biopsies and polypectomy  05/2009    2 tubular adenomas  . Laparoscopic appendectomy N/A 07/23/2012    Procedure: APPENDECTOMY LAPAROSCOPIC;  Surgeon: Gayland Curry, MD;  Location: [facility];  Service: General;  Laterality: N/A;  . Appendectomy  07/23/12    reports that she has never smoked. She  has never used smokeless tobacco. She reports that she does not drink alcohol or use illicit drugs. family history includes COPD in her father; Diabetes in her father and other; Heart attack in her maternal grandfather; Hypertension in her father and other; Lung cancer in her maternal grandmother; Migraines in her mother; Prostate cancer in her maternal uncle; Stroke in her maternal grandfather. There is no history of Colon cancer. Allergies  Allergen Reactions  . Ciprofloxacin In D5w Rash  . Penicillins     REACTION: rash  . Tricor [Fenofibrate] Rash   Current Outpatient Prescriptions on File Prior to Visit  Medication Sig Dispense Refill  . baclofen (LIORESAL) 10 MG tablet Take 10 mg by mouth daily at 2 PM daily at 2 PM.    . cetirizine (ZYRTEC) 10 MG tablet Take 10 mg by mouth daily.      . Cholecalciferol (VITAMIN D) 2000 UNITS CAPS Take 1 capsule by mouth every morning.    . cyanocobalamin (,VITAMIN B-12,) 1000 MCG/ML injection Inject 1,000 mcg into the muscle every 30 (thirty) days.    . Dimethyl Fumarate (TECFIDERA) 240 MG CPDR Take by mouth 2 (two) times daily.    . furosemide (LASIX) 40 MG tablet TAKE 1 TABLET DAILY 90 tablet 3  . gabapentin (NEURONTIN) 300 MG capsule Take 300 mg by mouth 3 (three) times daily.     Marland Kitchen gemfibrozil (LOPID) 600 MG tablet Take  1 tablet (600 mg total) by mouth 2 (two) times daily before a meal. 180 tablet 3  . glucose monitoring kit (FREESTYLE) monitoring kit 1 each by Does not apply route as needed for other. Dispense any model that is covered- dispense testing supplies for Q AC/ HS accuchecks- 1 month supply with one refil. 1 each 1  . insulin glargine (LANTUS) 100 UNIT/ML injection Inject 0.2 mLs (20 Units total) into the skin daily. 10 mL 11  . insulin lispro (HUMALOG) 100 UNIT/ML injection Inject 0.02-0.14 mLs (2-14 Units total) into the skin 4 (four) times daily -  before meals and at bedtime. 10 mL 11  . INSULIN SYRINGE .5CC/28G 28G X 1/2" 0.5 ML MISC  1 each by Does not apply route 4 (four) times daily -  before meals and at bedtime. 100 each 6  . KLOR-CON M10 10 MEQ tablet TAKE 1 TABLET DAILY 90 tablet 2  . loperamide (IMODIUM) 2 MG capsule Take 2 mg by mouth as needed for diarrhea or loose stools.    . medroxyPROGESTERone (DEPO-PROVERA) 150 MG/ML injection Inject 150 mg into the muscle every 3 (three) months.      . metoprolol (LOPRESSOR) 100 MG tablet TAKE 2 TABLETS DAILY 180 tablet 2  . nortriptyline (PAMELOR) 50 MG capsule Take 100 mg by mouth at bedtime. At bedtime    . omeprazole-sodium bicarbonate (ZEGERID) 40-1100 MG per capsule Take 1 capsule by mouth daily.       No current facility-administered medications on file prior to visit.   Review of Systems  Constitutional: Negative for unusual diaphoresis or night sweats HENT: Negative for ringing in ear or discharge Eyes: Negative for double vision or worsening visual disturbance.  Respiratory: Negative for choking and stridor.   Gastrointestinal: Negative for vomiting or other signifcant bowel change Genitourinary: Negative for hematuria or change in urine volume.  Musculoskeletal: Negative for other MSK pain or swelling Skin: Negative for color change and worsening wound.  Neurological: Negative for tremors and numbness other than noted  Psychiatric/Behavioral: Negative for decreased concentration or agitation other than above       Objective:   Physical Exam BP 114/80 mmHg  Pulse 99  Temp(Src) 98.8 F (37.1 C) (Oral)  Resp 18  Ht _0  (1.549 m)  Wt 198 lb 1.9 oz (89.867 kg)  BMI 37.45 kg/m2  SpO2 96% VS noted,  Constitutional: Pt appears in no significant distress HENT: Head: NCAT.  Right Ear: External ear normal.  Left Ear: External ear normal.  Eyes: . Pupils are equal, round, and reactive to light. Conjunctivae and EOM are normal Neck: Normal range of motion. Neck supple.  Cardiovascular: Normal rate and regular rhythm.   Pulmonary/Chest: Effort normal and  breath sounds without rales or wheezing.  Abd:  Soft, NT, ND, + BS Neurological: Pt is alert. Not confused , motor grossly intact Skin: Skin is warm. No rash, no LE edema Psychiatric: Pt behavior is normal. No agitation.      Assessment & Plan:

## 2014-08-21 ENCOUNTER — Ambulatory Visit (INDEPENDENT_AMBULATORY_CARE_PROVIDER_SITE_OTHER): Payer: Medicare Other | Admitting: *Deleted

## 2014-08-21 DIAGNOSIS — E538 Deficiency of other specified B group vitamins: Secondary | ICD-10-CM

## 2014-08-21 MED ORDER — CYANOCOBALAMIN 1000 MCG/ML IJ SOLN
1000.0000 ug | Freq: Once | INTRAMUSCULAR | Status: AC
  Administered 2014-08-21: 1000 ug via INTRAMUSCULAR

## 2014-08-22 ENCOUNTER — Ambulatory Visit (AMBULATORY_SURGERY_CENTER): Payer: Self-pay | Admitting: *Deleted

## 2014-08-22 VITALS — Ht 61.0 in | Wt 204.6 lb

## 2014-08-22 DIAGNOSIS — Z86010 Personal history of colonic polyps: Secondary | ICD-10-CM

## 2014-08-22 NOTE — Progress Notes (Signed)
No egg or soy allergy No issues with past sedation No diet pills No home 02 use emmi video declined

## 2014-09-02 ENCOUNTER — Ambulatory Visit (INDEPENDENT_AMBULATORY_CARE_PROVIDER_SITE_OTHER): Payer: Medicare Other | Admitting: Endocrinology

## 2014-09-02 ENCOUNTER — Other Ambulatory Visit: Payer: Self-pay

## 2014-09-02 ENCOUNTER — Encounter: Payer: Self-pay | Admitting: Endocrinology

## 2014-09-02 VITALS — BP 122/86 | HR 83 | Temp 98.4°F | Wt 200.0 lb

## 2014-09-02 DIAGNOSIS — E119 Type 2 diabetes mellitus without complications: Secondary | ICD-10-CM | POA: Diagnosis not present

## 2014-09-02 MED ORDER — INSULIN GLARGINE 100 UNIT/ML ~~LOC~~ SOLN: 10.0000 [IU] | Freq: Every day | SUBCUTANEOUS | Status: DC

## 2014-09-02 MED ORDER — METFORMIN HCL ER 500 MG PO TB24: 2000.0000 mg | ORAL_TABLET | Freq: Every day | ORAL | Status: DC

## 2014-09-02 NOTE — Patient Instructions (Addendum)
good diet and exercise significantly improve the control of your diabetes.  please let me know if you wish to be referred to a dietician.  high blood sugar is very risky to your health.  you should see an eye doctor and dentist every year.  It is very important to get all recommended vaccinations.  controlling your blood pressure and cholesterol drastically reduces the damage diabetes does to your body.  Those who smoke should quit.  please discuss these with your doctor.  check your blood sugar twice a day.  vary the time of day when you check, between before the 3 meals, and at bedtime.  also check if you have symptoms of your blood sugar being too high or too low.  please keep a record of the readings and bring it to your next appointment here.  You can write it on any piece of paper.  please call us sooner if your blood sugar goes below 70, or if you have a lot of readings over 200. At our office, we are fortunate to have two specialists who are happy to help you:   Leonia Reader, RN, CDE, is a diabetes educator and pump trainer.  She is here on Monday mornings, and all day Tuesday and Wednesday.  She is can help you with low blood sugar avoidance and treatment, injecting insulin, sick day management, and others.   Antonieta Iba, RD is our dietician.  She is here all day Thursday and Friday.  She can advise you about a healthy diet.  She can also help you about a variety of special diabetes situations, such as shift work, Actor, gluten-free, diet for kidney patients, traveling with diabetes, and help for those who need to gain weight.   we will need to take this complex situation in stages i have sent a prescription to your pharmacy, to add metformin. Please reduce the lantus to 10 units daily.  Please take the humalog only if the blood sugar is over 250.  Please come back for a follow-up appointment in 2 weeks.  Please call next week, to tell us how the blood sugar is doing.  If it is high,  we'll add another pill.  If it is good, we can change the lantus to another diabetes pill.  Please consider having weight loss surgery.  It is good for your health.  Here is some information about it.  If you decide to consider further, please call the phone number in the papers, and register for a free informational meeting.

## 2014-09-02 NOTE — Progress Notes (Signed)
Subjective:    Patient ID: Courtney [name], female    DOB: [birth year], 39 y.o.   MRN: [id number]  HPI pt states DM was dx'ed in 2016, when she presented with nonketotic hyperosmolar hyperglycemic state, but not DKA; she has mild if any neuropathy of the lower extremities; she is unaware of any associated chronic complications; she has been on insulin since; pt says his diet and exercise are good; she has never had GDM, pancreatitis, severe hypoglycemia or DKA.  She was started on lantus qd, and prn humalog (she takes only qam).  she brings a record of her cbg's which i have reviewed today.  It varies from 109-200.  It is highest in am, but she does not check at hs.   Past Medical History  Diagnosis Date  . GERD (gastroesophageal reflux disease)   . Fibromyalgia   . Chronic fatigue   . CTS (carpal tunnel syndrome)   . Anemia, B12 deficiency   . Multiple sclerosis   . Hyperlipidemia   . C. difficile colitis   . Anxiety   . Heart murmur   . VITAMIN D DEFICIENCY 01/13/2010  . OBSTRUCTIVE SLEEP APNEA 12/08/2008  . Irritable bowel syndrome 05/24/2010  . HYPERLIPIDEMIA 03/13/2007  . ESSENTIAL HYPERTENSION 03/13/2007  . CARPAL TUNNEL SYNDROME, BILATERAL 01/13/2010  . ALLERGIC RHINITIS 03/13/2007  . Cervical lesion 07/13/2011    Spine lesion - indeterminate, for f/u MRI may 2013 per neurology  . Adenomatous polyp 04/22/2014  . Diabetes mellitus without complication   . Allergy   . Sleep apnea     uses c pap    Past Surgical History  Procedure Laterality Date  . Carpal tunnel release  01/2010    bilateral  . Colonoscopy w/ biopsies and polypectomy  05/2009    2 tubular adenomas  . Laparoscopic appendectomy N/A 07/23/2012    Procedure: APPENDECTOMY LAPAROSCOPIC;  Surgeon: Gayland Curry, MD;  Location: [facility];  Service: General;  Laterality: N/A;  . Appendectomy  07/23/12  . Polypectomy    . Colonoscopy      History   Social History  . Marital Status: Married    Spouse Name: N/A  . Number  of Children: 2  . Years of Education: N/A   Occupational History  . 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 Smoking status: Never Smoker   . Smokeless tobacco: Never Used  . Alcohol Use: No  . Drug Use: No  . Sexual Activity: Not on file   Other Topics Concern  . Not on file   Social History Narrative   Daily Caffeine    Current Outpatient Prescriptions on File Prior to Visit  Medication Sig Dispense Refill  . baclofen (LIORESAL) 10 MG tablet Take 10 mg by mouth daily at 2 PM daily at 2 PM.    . bisacodyl (DULCOLAX) 5 MG EC tablet Take 5 mg by mouth once. For 5-20 colon only    . cetirizine (ZYRTEC) 10 MG tablet Take 10 mg by mouth daily.      . Cholecalciferol (VITAMIN D) 2000 UNITS CAPS Take 1 capsule by mouth every morning.    . cyanocobalamin (,VITAMIN B-12,) 1000 MCG/ML injection Inject 1,000 mcg into the muscle every 30 (thirty) days.    . Dimethyl Fumarate (TECFIDERA) 240 MG CPDR Take by mouth 2 (two) times daily.    . furosemide (LASIX) 40 MG tablet TAKE 1 TABLET DAILY 90 tablet 3  . gabapentin (NEURONTIN) 300  MG capsule Take 300 mg by mouth 3 (three) times daily.     Marland Kitchen gemfibrozil (LOPID) 600 MG tablet Take 1 tablet (600 mg total) by mouth 2 (two) times daily before a meal. 180 tablet 3  . glucose monitoring kit (FREESTYLE) monitoring kit 1 each by Does not apply route as needed for other. Dispense any model that is covered- dispense testing supplies for Q AC/ HS accuchecks- 1 month supply with one refil. 1 each 1  . insulin lispro (HUMALOG) 100 UNIT/ML injection Inject 0.02-0.14 mLs (2-14 Units total) into the skin 4 (four) times daily -  before meals and at bedtime. 10 mL 11  . INSULIN SYRINGE .5CC/28G 28G X 1/2" 0.5 ML MISC 1 each by Does not apply route 4 (four) times daily -  before meals and at bedtime. 100 each 6  . KLOR-CON M10 10 MEQ tablet TAKE 1 TABLET DAILY 90 tablet 2  . loperamide (IMODIUM) 2 MG capsule Take 2 mg by mouth as needed for  diarrhea or loose stools.    . medroxyPROGESTERone (DEPO-PROVERA) 150 MG/ML injection Inject 150 mg into the muscle every 3 (three) months.      . metoprolol (LOPRESSOR) 100 MG tablet TAKE 2 TABLETS DAILY 180 tablet 2  . nortriptyline (PAMELOR) 50 MG capsule Take 100 mg by mouth at bedtime. At bedtime    . omeprazole-sodium bicarbonate (ZEGERID) 40-1100 MG per capsule Take 1 capsule by mouth daily.      . polyethylene glycol powder (GLYCOLAX/MIRALAX) powder Take 1 Container by mouth once. For 5-20 colon only     No current facility-administered medications on file prior to visit.    Allergies  Allergen Reactions  . Ciprofloxacin In D5w Rash  . Penicillins     REACTION: rash  . Tricor [Fenofibrate] Rash    Family History  Problem Relation Age of Onset  . Migraines Mother   . Prostate cancer Maternal Uncle     prostate cancer  . Lung cancer Maternal Grandmother   . Stroke Maternal Grandfather   . Heart attack Maternal Grandfather   . Diabetes Other   . Hypertension Other   . Colon cancer Neg Hx   . Esophageal cancer Neg Hx   . Rectal cancer Neg Hx   . Stomach cancer Neg Hx   . Hypertension Father   . COPD Father   . Diabetes Father     BP 122/86 mmHg  Pulse 83  Temp(Src) 98.4 F (36.9 C) (Oral)  Wt 200 lb (90.719 kg)  SpO2 96%   Review of Systems denies blurry vision, headache, chest pain, sob, n/v, urinary frequency, muscle cramps, excessive diaphoresis, depression, cold intolerance, rhinorrhea, and easy bruising.  she has lost 20 lbs.      Objective:   Physical Exam VITAL SIGNS:  See vs page GENERAL: no distress Pulses: dorsalis pedis intact bilat.   MSK: no deformity of the feet CV: no leg edema Skin:  no ulcer on the feet.  normal color and temp on the feet. Neuro: sensation is intact to touch on the feet.     Lab Results  Component Value Date   HGBA1C 9.0* 08/11/2014      Assessment & Plan:  DM: severe exacerbation.  We discussed.  Pt says he  wants to go back to orals if possible.  Obesity, improved: new to me  Patient is advised the following: Patient Instructions  good diet and exercise significantly improve the control of your diabetes.  please let me  know if you wish to be referred to a dietician.  high blood sugar is very risky to your health.  you should see an eye doctor and dentist every year.  It is very important to get all recommended vaccinations.  controlling your blood pressure and cholesterol drastically reduces the damage diabetes does to your body.  Those who smoke should quit.  please discuss these with your doctor.  check your blood sugar twice a day.  vary the time of day when you check, between before the 3 meals, and at bedtime.  also check if you have symptoms of your blood sugar being too high or too low.  please keep a record of the readings and bring it to your next appointment here.  You can write it on any piece of paper.  please call us sooner if your blood sugar goes below 70, or if you have a lot of readings over 200. At our office, we are fortunate to have two specialists who are happy to help you:   Leonia Reader, RN, CDE, is a diabetes educator and pump trainer.  She is here on Monday mornings, and all day Tuesday and Wednesday.  She is can help you with low blood sugar avoidance and treatment, injecting insulin, sick day management, and others.   Antonieta Iba, RD is our dietician.  She is here all day Thursday and Friday.  She can advise you about a healthy diet.  She can also help you about a variety of special diabetes situations, such as shift work, Actor, gluten-free, diet for kidney patients, traveling with diabetes, and help for those who need to gain weight.   we will need to take this complex situation in stages i have sent a prescription to your pharmacy, to add metformin. Please reduce the lantus to 10 units daily.  Please take the humalog only if the blood sugar is over 250.  Please  come back for a follow-up appointment in 2 weeks.  Please call next week, to tell us how the blood sugar is doing.  If it is high, we'll add another pill.  If it is good, we can change the lantus to another diabetes pill.  Please consider having weight loss surgery.  It is good for your health.  Here is some information about it.  If you decide to consider further, please call the phone number in the papers, and register for a free informational meeting.

## 2014-09-05 ENCOUNTER — Encounter: Payer: Self-pay | Admitting: Internal Medicine

## 2014-09-05 ENCOUNTER — Ambulatory Visit (AMBULATORY_SURGERY_CENTER): Payer: Medicare Other | Admitting: Internal Medicine

## 2014-09-05 VITALS — BP 108/61 | HR 61 | Temp 97.9°F | Resp 25 | Ht 61.0 in | Wt 204.0 lb

## 2014-09-05 DIAGNOSIS — E119 Type 2 diabetes mellitus without complications: Secondary | ICD-10-CM | POA: Diagnosis not present

## 2014-09-05 DIAGNOSIS — Z86010 Personal history of colonic polyps: Secondary | ICD-10-CM | POA: Diagnosis not present

## 2014-09-05 DIAGNOSIS — G35 Multiple sclerosis: Secondary | ICD-10-CM | POA: Diagnosis not present

## 2014-09-05 DIAGNOSIS — I10 Essential (primary) hypertension: Secondary | ICD-10-CM | POA: Diagnosis not present

## 2014-09-05 DIAGNOSIS — G4730 Sleep apnea, unspecified: Secondary | ICD-10-CM | POA: Diagnosis not present

## 2014-09-05 LAB — GLUCOSE, CAPILLARY: GLUCOSE-CAPILLARY: 81 mg/dL (ref 65–99)

## 2014-09-05 MED ORDER — SODIUM CHLORIDE 0.9 % IV SOLN: 500.0000 mL | INTRAVENOUS | Status: DC

## 2014-09-05 NOTE — Op Note (Signed)
[facility]  [address] [place], [id number]   COLONOSCOPY PROCEDURE REPORT  PATIENT: Courtney [name], Courtney [name]  MR#: [id number] BIRTHDATE: 10-08-1975 , 39  yrs. old GENDER: female ENDOSCOPIST: Gatha Mayer, MD, [facility] PROCEDURE DATE:  09/05/2014 PROCEDURE:   Colonoscopy, surveillance First Screening Colonoscopy - Avg.  risk and is 50 yrs.  old or older - No.  Prior Negative Screening - Now for repeat screening. N/A  History of Adenoma - Now for follow-up colonoscopy & has been > or = to 3 yrs.  Yes hx of adenoma.  Has been 3 or more years since last colonoscopy.  Polyps removed today? No Recommend repeat exam, <10 yrs? No ASA CLASS:   Class III INDICATIONS:Surveillance due to prior colonic neoplasia and PH Colon Adenoma. MEDICATIONS: Propofol 400 mg IV and Monitored anesthesia care  DESCRIPTION OF PROCEDURE:   After the risks benefits and alternatives of the procedure were thoroughly explained, informed consent was obtained.  The digital rectal exam revealed no abnormalities of the rectum.   The LB [id number]  endoscope was introduced through the anus and advanced to the cecum, which was identified by both the appendix and ileocecal valve. No adverse events experienced.   The quality of the prep was good.  (MiraLax was used)  The instrument was then slowly withdrawn as the colon was fully examined. Estimated blood loss is zero unless otherwise noted in this procedure report.      COLON FINDINGS: A normal appearing cecum, ileocecal valve, and appendiceal orifice were identified.  The ascending, transverse, descending, sigmoid colon, and rectum appeared unremarkable. Retroflexed views revealed no abnormalities. The time to cecum = 2.9 Withdrawal time = 14.8   The scope was withdrawn and the procedure completed. COMPLICATIONS: There were no immediate complications.  ENDOSCOPIC IMPRESSION: Normal colonoscopy - good prep  RECOMMENDATIONS: Repeat colonoscopy  10 years.  2026 (only 1 diminutive adenoma 5 yrs ago)  eSigned:  Gatha Mayer, MD, [facility] 09/05/2014 9:46 AM   cc: The Patient

## 2014-09-05 NOTE — Progress Notes (Signed)
Pt's blood sugar- 81.  No symptoms of low blood sugar- dizziness, feeling faint.  I made Mart Piggs RN- recovery room nurse made aware.

## 2014-09-05 NOTE — Progress Notes (Signed)
Pt awake and alert, pleased with MAC. Report to RN 

## 2014-09-05 NOTE — Patient Instructions (Addendum)
No polyps today! Next routine colonoscopy in 10 years - 2026  I appreciate the opportunity to care for you. Gatha Mayer, MD, FACG  YOU HAD AN ENDOSCOPIC PROCEDURE TODAY AT [facility]:   Refer to the procedure report that was given to you for any specific questions about what was found during the examination.  If the procedure report does not answer your questions, please call your gastroenterologist to clarify.  If you requested that your care partner not be given the details of your procedure findings, then the procedure report has been included in a sealed envelope for you to review at your convenience later.  YOU SHOULD EXPECT: Some feelings of bloating in the abdomen. Passage of more gas than usual.  Walking can help get rid of the air that was put into your GI tract during the procedure and reduce the bloating. If you had a lower endoscopy (such as a colonoscopy or flexible sigmoidoscopy) you may notice spotting of blood in your stool or on the toilet paper. If you underwent a bowel prep for your procedure, you may not have a normal bowel movement for a few days.  Please Note:  You might notice some irritation and congestion in your nose or some drainage.  This is from the oxygen used during your procedure.  There is no need for concern and it should clear up in a day or so.  SYMPTOMS TO REPORT IMMEDIATELY:   Following lower endoscopy (colonoscopy or flexible sigmoidoscopy):  Excessive amounts of blood in the stool  Significant tenderness or worsening of abdominal pains  Swelling of the abdomen that is new, acute  Fever of 100F or higher   For urgent or emergent issues, a gastroenterologist can be reached at any hour by calling [phone].   DIET: Your first meal following the procedure should be a small meal and then it is ok to progress to your normal diet. Heavy or fried foods are harder to digest and may make you feel nauseous or bloated.   Likewise, meals heavy in dairy and vegetables can increase bloating.  Drink plenty of fluids but you should avoid alcoholic beverages for 24 hours.  ACTIVITY:  You should plan to take it easy for the rest of today and you should NOT DRIVE or use heavy machinery until tomorrow (because of the sedation medicines used during the test).    FOLLOW UP: Our staff will call the number listed on your records the next business day following your procedure to check on you and address any questions or concerns that you may have regarding the information given to you following your procedure. If we do not reach you, we will leave a message.  However, if you are feeling well and you are not experiencing any problems, there is no need to return our call.  We will assume that you have returned to your regular daily activities without incident.  If any biopsies were taken you will be contacted by phone or by letter within the next 1-3 weeks.  Please call us at [phone] if you have not heard about the biopsies in 3 weeks.    SIGNATURES/CONFIDENTIALITY: You and/or your care partner have signed paperwork which will be entered into your electronic medical record.  These signatures attest to the fact that that the information above on your After Visit Summary has been reviewed and is understood.  Full responsibility of the confidentiality of this discharge information lies with you and/or  your care-partner. 

## 2014-09-08 ENCOUNTER — Telehealth: Payer: Self-pay | Admitting: *Deleted

## 2014-09-08 NOTE — Telephone Encounter (Signed)
  Follow up Call-  Call back number 09/05/2014  Post procedure Call Back phone  # [phone]  Permission to leave phone message Yes     Patient questions:  Do you have a fever, pain , or abdominal swelling? No. Pain Score  0 *  Have you tolerated food without any problems? Yes.    Have you been able to return to your normal activities? Yes.    Do you have any questions about your discharge instructions: Diet   No. Medications  No. Follow up visit  No.  Do you have questions or concerns about your Care? No.  Actions: * If pain score is 4 or above: No action needed, pain <4.

## 2014-09-12 ENCOUNTER — Other Ambulatory Visit: Payer: Self-pay | Admitting: Internal Medicine

## 2014-09-12 ENCOUNTER — Encounter: Payer: Self-pay | Admitting: Endocrinology

## 2014-09-16 ENCOUNTER — Ambulatory Visit (INDEPENDENT_AMBULATORY_CARE_PROVIDER_SITE_OTHER): Payer: Medicare Other | Admitting: Endocrinology

## 2014-09-16 ENCOUNTER — Encounter: Payer: Self-pay | Admitting: Endocrinology

## 2014-09-16 VITALS — BP 126/84 | HR 80 | Temp 98.4°F | Ht 61.0 in | Wt 199.0 lb

## 2014-09-16 DIAGNOSIS — E119 Type 2 diabetes mellitus without complications: Secondary | ICD-10-CM | POA: Diagnosis not present

## 2014-09-16 MED ORDER — REPAGLINIDE 0.5 MG PO TABS: 0.5000 mg | ORAL_TABLET | Freq: Three times a day (TID) | ORAL | Status: DC

## 2014-09-16 NOTE — Patient Instructions (Addendum)
check your blood sugar twice a day.  vary the time of day when you check, between before the 3 meals, and at bedtime.  also check if you have symptoms of your blood sugar being too high or too low.  please keep a record of the readings and bring it to your next appointment here.  You can write it on any piece of paper.  please call us sooner if your blood sugar goes below 70, or if you have a lot of readings over 200.   we will need to take this complex situation in stages. Please change the lantus to "repaglinide."  i have sent a prescription to your pharmacy.  We can increase this if necessary. Please take the humalog only if the blood sugar is over 250.  Please come back for a follow-up appointment in 6 weeks.

## 2014-09-16 NOTE — Progress Notes (Signed)
Subjective:    Patient ID: Courtney [name], female    DOB: 1976-02-17, 39 y.o.   MRN: [id number]  HPI Pt returns for f/u of diabetes mellitus: DM type: 2 Dx'ed: 2016, when she presented with nonketotic hyperosmolar hyperglycemic state, but not DKA Complications:  Therapy: metformin GDM: never DKA: never Severe hypoglycemia: never Pancreatitis: never Other: she took lantus for a brief time in early 2016 Interval history: she brings a record of her cbg's which i have reviewed today.  All are in the mid-100's.  There is no trend throughout the day.  pt states she feels well in general.  Past Medical History  Diagnosis Date  . GERD (gastroesophageal reflux disease)   . Fibromyalgia   . Chronic fatigue   . CTS (carpal tunnel syndrome)   . Anemia, B12 deficiency   . Multiple sclerosis   . Hyperlipidemia   . C. difficile colitis   . Anxiety   . Heart murmur   . VITAMIN D DEFICIENCY 01/13/2010  . OBSTRUCTIVE SLEEP APNEA 12/08/2008  . Irritable bowel syndrome 05/24/2010  . HYPERLIPIDEMIA 03/13/2007  . ESSENTIAL HYPERTENSION 03/13/2007  . CARPAL TUNNEL SYNDROME, BILATERAL 01/13/2010  . ALLERGIC RHINITIS 03/13/2007  . Cervical lesion 07/13/2011    Spine lesion - indeterminate, for f/u MRI may 2013 per neurology  . Diabetes mellitus without complication   . Allergy   . Sleep apnea     uses c pap  . Hx of adenomatous polyp of colon 06/09/2009    Past Surgical History  Procedure Laterality Date  . Carpal tunnel release  01/2010    bilateral  . Colonoscopy w/ biopsies and polypectomy  05/2009    2 tubular adenomas  . Laparoscopic appendectomy N/A 07/23/2012    Procedure: APPENDECTOMY LAPAROSCOPIC;  Surgeon: Gayland Curry, MD;  Location: [place];  Service: General;  Laterality: N/A;  . Appendectomy  07/23/12    History   Social History  . Marital Status: Married    Spouse Name: N/A  . Number of Children: 2  . Years of Education: N/A   Occupational History  . 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 Smoking status: Never Smoker   . Smokeless tobacco: Never Used  . Alcohol Use: No  . Drug Use: No  . Sexual Activity: Not on file   Other Topics Concern  . Not on file   Social History Narrative   Daily Caffeine    Current Outpatient Prescriptions on File Prior to Visit  Medication Sig Dispense Refill  . baclofen (LIORESAL) 10 MG tablet Take 10 mg by mouth daily at 2 PM daily at 2 PM.    . cetirizine (ZYRTEC) 10 MG tablet Take 10 mg by mouth daily.      . Cholecalciferol (VITAMIN D) 2000 UNITS CAPS Take 1 capsule by mouth every morning.    . cyanocobalamin (,VITAMIN B-12,) 1000 MCG/ML injection Inject 1,000 mcg into the muscle every 30 (thirty) days.    . Dimethyl Fumarate (TECFIDERA) 240 MG CPDR Take by mouth 2 (two) times daily.    . furosemide (LASIX) 40 MG tablet TAKE 1 TABLET DAILY 90 tablet 3  . gabapentin (NEURONTIN) 300 MG capsule Take 300 mg by mouth 3 (three) times daily.     Marland Kitchen gemfibrozil (LOPID) 600 MG tablet Take 1 tablet (600 mg total) by mouth 2 (two) times daily before a meal. 180 tablet 3  . glucose monitoring kit (FREESTYLE) monitoring kit 1 each  by Does not apply route as needed for other. Dispense any model that is covered- dispense testing supplies for Q AC/ HS accuchecks- 1 month supply with one refil. 1 each 1  . INSULIN SYRINGE .5CC/28G 28G X 1/2" 0.5 ML MISC 1 each by Does not apply route 4 (four) times daily -  before meals and at bedtime. 100 each 6  . KLOR-CON M10 10 MEQ tablet TAKE 1 TABLET DAILY 90 tablet 1  . loperamide (IMODIUM) 2 MG capsule Take 2 mg by mouth as needed for diarrhea or loose stools.    . medroxyPROGESTERone (DEPO-PROVERA) 150 MG/ML injection Inject 150 mg into the muscle every 3 (three) months.      . metoprolol (LOPRESSOR) 100 MG tablet TAKE 2 TABLETS DAILY 180 tablet 1  . nortriptyline (PAMELOR) 50 MG capsule Take 100 mg by mouth at bedtime. At bedtime    . omeprazole-sodium bicarbonate (ZEGERID)  40-1100 MG per capsule Take 1 capsule by mouth daily.       No current facility-administered medications on file prior to visit.    Allergies  Allergen Reactions  . Ciprofloxacin In D5w Rash  . Penicillins     REACTION: rash  . Tricor [Fenofibrate] Rash    Family History  Problem Relation Age of Onset  . Migraines Mother   . Prostate cancer Maternal Uncle     prostate cancer  . Lung cancer Maternal Grandmother   . Stroke Maternal Grandfather   . Heart attack Maternal Grandfather   . Diabetes Other   . Hypertension Other   . Colon cancer Neg Hx   . Esophageal cancer Neg Hx   . Rectal cancer Neg Hx   . Stomach cancer Neg Hx   . Hypertension Father   . COPD Father   . Diabetes Father     BP 126/84 mmHg  Pulse 80  Temp(Src) 98.4 F (36.9 C) (Oral)  Ht 5' 1" (1.549 m)  Wt 199 lb (90.266 kg)  BMI 37.62 kg/m2  SpO2 98%   Review of Systems She denies hypoglycemia.    Objective:   Physical Exam VITAL SIGNS:  See vs page GENERAL: no distress Pulses: dorsalis pedis intact bilat.   MSK: no deformity of the feet CV: no leg edema Skin:  no ulcer on the feet.  normal color and temp on the feet. Neuro: sensation is intact to touch on the feet     Assessment & Plan:  DM: she can go off insulin now.   Patient is advised the following: Patient Instructions  check your blood sugar twice a day.  vary the time of day when you check, between before the 3 meals, and at bedtime.  also check if you have symptoms of your blood sugar being too high or too low.  please keep a record of the readings and bring it to your next appointment here.  You can write it on any piece of paper.  please call us sooner if your blood sugar goes below 70, or if you have a lot of readings over 200.   we will need to take this complex situation in stages. Please change the lantus to "repaglinide."  i have sent a prescription to your pharmacy.  We can increase this if necessary. Please take the  humalog only if the blood sugar is over 250.  Please come back for a follow-up appointment in 6 weeks.

## 2014-09-22 ENCOUNTER — Ambulatory Visit: Payer: Medicare Other

## 2014-09-26 ENCOUNTER — Telehealth: Payer: Self-pay

## 2014-09-26 NOTE — Telephone Encounter (Signed)
Gemfibrozil is not supposed to be taken with Repaglinide med. Could you please inform pt which med to take.

## 2014-10-07 ENCOUNTER — Telehealth: Payer: Self-pay | Admitting: *Deleted

## 2014-10-07 NOTE — Telephone Encounter (Signed)
Patient has upcoming appointment on 10/29/14.

## 2014-10-14 ENCOUNTER — Ambulatory Visit (INDEPENDENT_AMBULATORY_CARE_PROVIDER_SITE_OTHER): Payer: Medicare Other

## 2014-10-14 DIAGNOSIS — E538 Deficiency of other specified B group vitamins: Secondary | ICD-10-CM | POA: Diagnosis not present

## 2014-10-14 DIAGNOSIS — Z30013 Encounter for initial prescription of injectable contraceptive: Secondary | ICD-10-CM | POA: Diagnosis not present

## 2014-10-14 MED ORDER — MEDROXYPROGESTERONE ACETATE 150 MG/ML IM SUSP
150.0000 mg | Freq: Once | INTRAMUSCULAR | Status: AC
  Administered 2014-10-14: 150 mg via INTRAMUSCULAR

## 2014-10-14 MED ORDER — CYANOCOBALAMIN 1000 MCG/ML IJ SOLN
1000.0000 ug | Freq: Once | INTRAMUSCULAR | Status: AC
  Administered 2014-10-14: 1000 ug via INTRAMUSCULAR

## 2014-10-15 ENCOUNTER — Ambulatory Visit: Payer: Medicare Other

## 2014-10-29 ENCOUNTER — Encounter: Payer: Self-pay | Admitting: Endocrinology

## 2014-10-29 ENCOUNTER — Ambulatory Visit (INDEPENDENT_AMBULATORY_CARE_PROVIDER_SITE_OTHER): Payer: Medicare Other | Admitting: Endocrinology

## 2014-10-29 VITALS — BP 124/82 | HR 70 | Temp 98.0°F | Ht 61.0 in | Wt 196.0 lb

## 2014-10-29 DIAGNOSIS — E119 Type 2 diabetes mellitus without complications: Secondary | ICD-10-CM | POA: Diagnosis not present

## 2014-10-29 LAB — POCT GLYCOSYLATED HEMOGLOBIN (HGB A1C): Hemoglobin A1C: 6

## 2014-10-29 MED ORDER — SITAGLIPTIN PHOSPHATE 100 MG PO TABS: 100.0000 mg | ORAL_TABLET | Freq: Every day | ORAL | Status: DC

## 2014-10-29 NOTE — Patient Instructions (Addendum)
check your blood sugar twice a day.  vary the time of day when you check, between before the 3 meals, and at bedtime.  also check if you have symptoms of your blood sugar being too high or too low.  please keep a record of the readings and bring it to your next appointment here.  You can write it on any piece of paper.  please call us sooner if your blood sugar goes below 70, or if you have a lot of readings over 200, so we can add "invokana."   Please change the lantus to "januvia."  i have sent a prescription to your pharmacy.  Please come back for a follow-up appointment in 3 months.

## 2014-10-29 NOTE — Progress Notes (Signed)
Subjective:    Patient ID: Courtney [name], female    DOB: Sep 15, 1975, 39 y.o.   MRN: [id number]  HPI Pt returns for f/u of diabetes mellitus: DM type: 2 Dx'ed: 2016, when she presented with nonketotic hyperosmolar hyperglycemic state, but not DKA Complications:  Therapy: insulin since 2016.  GDM: never DKA: never Severe hypoglycemia: never. Pancreatitis: never.  Other: she took lantus for a brief time in early 2016; metformin dosage is limited by nausea.    Interval history: Pt says the prandin caused n/v, so she went back to the lantus.  she brings a record of her cbg's which i have reviewed today.   It varies from 100-250.  It is in general higher as the day goes on.  She is still considering weight loss surgery.   Past Medical History  Diagnosis Date  . GERD (gastroesophageal reflux disease)   . Fibromyalgia   . Chronic fatigue   . CTS (carpal tunnel syndrome)   . Anemia, B12 deficiency   . Multiple sclerosis   . Hyperlipidemia   . C. difficile colitis   . Anxiety   . Heart murmur   . VITAMIN D DEFICIENCY 01/13/2010  . OBSTRUCTIVE SLEEP APNEA 12/08/2008  . Irritable bowel syndrome 05/24/2010  . HYPERLIPIDEMIA 03/13/2007  . ESSENTIAL HYPERTENSION 03/13/2007  . CARPAL TUNNEL SYNDROME, BILATERAL 01/13/2010  . ALLERGIC RHINITIS 03/13/2007  . Cervical lesion 07/13/2011    Spine lesion - indeterminate, for f/u MRI may 2013 per neurology  . Diabetes mellitus without complication   . Allergy   . Sleep apnea     uses c pap  . Hx of adenomatous polyp of colon 06/09/2009    Past Surgical History  Procedure Laterality Date  . Carpal tunnel release  01/2010    bilateral  . Colonoscopy w/ biopsies and polypectomy  05/2009    2 tubular adenomas  . Laparoscopic appendectomy N/A 07/23/2012    Procedure: APPENDECTOMY LAPAROSCOPIC;  Surgeon: Gayland Curry, MD;  Location: [place];  Service: General;  Laterality: N/A;  . Appendectomy  07/23/12    History   Social History  . Marital  Status: Married    Spouse Name: N/A  . Number of Children: 2  . Years of Education: N/A   Occupational History  . 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 Smoking status: Never Smoker   . Smokeless tobacco: Never Used  . Alcohol Use: No  . Drug Use: No  . Sexual Activity: Not on file   Other Topics Concern  . Not on file   Social History Narrative   Daily Caffeine    Current Outpatient Prescriptions on File Prior to Visit  Medication Sig Dispense Refill  . baclofen (LIORESAL) 10 MG tablet Take 10 mg by mouth daily at 2 PM daily at 2 PM.    . cetirizine (ZYRTEC) 10 MG tablet Take 10 mg by mouth daily.      . Cholecalciferol (VITAMIN D) 2000 UNITS CAPS Take 1 capsule by mouth every morning.    . cyanocobalamin (,VITAMIN B-12,) 1000 MCG/ML injection Inject 1,000 mcg into the muscle every 30 (thirty) days.    . Dimethyl Fumarate (TECFIDERA) 240 MG CPDR Take by mouth 2 (two) times daily.    . furosemide (LASIX) 40 MG tablet TAKE 1 TABLET DAILY 90 tablet 3  . gabapentin (NEURONTIN) 300 MG capsule Take 300 mg by mouth 3 (three) times daily.     Marland Kitchen gemfibrozil (LOPID)  600 MG tablet Take 1 tablet (600 mg total) by mouth 2 (two) times daily before a meal. 180 tablet 3  . glucose monitoring kit (FREESTYLE) monitoring kit 1 each by Does not apply route as needed for other. Dispense any model that is covered- dispense testing supplies for Q AC/ HS accuchecks- 1 month supply with one refil. 1 each 1  . INSULIN SYRINGE .5CC/28G 28G X 1/2" 0.5 ML MISC 1 each by Does not apply route 4 (four) times daily -  before meals and at bedtime. 100 each 6  . KLOR-CON M10 10 MEQ tablet TAKE 1 TABLET DAILY 90 tablet 1  . loperamide (IMODIUM) 2 MG capsule Take 2 mg by mouth as needed for diarrhea or loose stools.    . medroxyPROGESTERone (DEPO-PROVERA) 150 MG/ML injection Inject 150 mg into the muscle every 3 (three) months.      . metFORMIN (GLUCOPHAGE-XR) 500 MG 24 hr tablet Take 500 mg by  mouth 2 (two) times daily.    . metoprolol (LOPRESSOR) 100 MG tablet TAKE 2 TABLETS DAILY 180 tablet 1  . nortriptyline (PAMELOR) 50 MG capsule Take 100 mg by mouth at bedtime. At bedtime    . omeprazole-sodium bicarbonate (ZEGERID) 40-1100 MG per capsule Take 1 capsule by mouth daily.       No current facility-administered medications on file prior to visit.    Allergies  Allergen Reactions  . Ciprofloxacin In D5w Rash  . Penicillins     REACTION: rash  . Tricor [Fenofibrate] Rash    Family History  Problem Relation Age of Onset  . Migraines Mother   . Prostate cancer Maternal Uncle     prostate cancer  . Lung cancer Maternal Grandmother   . Stroke Maternal Grandfather   . Heart attack Maternal Grandfather   . Diabetes Other   . Hypertension Other   . Colon cancer Neg Hx   . Esophageal cancer Neg Hx   . Rectal cancer Neg Hx   . Stomach cancer Neg Hx   . Hypertension Father   . COPD Father   . Diabetes Father     BP 124/82 mmHg  Pulse 70  Temp(Src) 98 F (36.7 C) (Oral)  Ht _0  (1.549 m)  Wt 196 lb (88.905 kg)  BMI 37.05 kg/m2  SpO2 97%  Review of Systems She denies hypoglycemia.     Objective:   Physical Exam VITAL SIGNS:  See vs page GENERAL: no distress Pulses: dorsalis pedis intact bilat.   MSK: no deformity of the feet CV: no leg edema Skin:  no ulcer on the feet.  normal color and temp on the feet. Neuro: sensation is intact to touch on the feet.     Lab Results  Component Value Date   HGBA1C 6 0 10/29/2014      Assessment & Plan:  DM: well-controlled.  She can go back to oral rx.   Nausea: unlikely due to prandin, but alternatives are available.    Patient is advised the following: Patient Instructions  check your blood sugar twice a day.  vary the time of day when you check, between before the 3 meals, and at bedtime.  also check if you have symptoms of your blood sugar being too high or too low.  please keep a record of the readings and  bring it to your next appointment here.  You can write it on any piece of paper.  please call us sooner if your blood sugar goes below 70,  or if you have a lot of readings over 200, so we can add "invokana."   Please change the lantus to "januvia."  i have sent a prescription to your pharmacy.  Please come back for a follow-up appointment in 3 months.

## 2014-11-11 ENCOUNTER — Ambulatory Visit (INDEPENDENT_AMBULATORY_CARE_PROVIDER_SITE_OTHER): Payer: Medicare Other

## 2014-11-11 DIAGNOSIS — E538 Deficiency of other specified B group vitamins: Secondary | ICD-10-CM

## 2014-11-11 MED ORDER — CYANOCOBALAMIN 1000 MCG/ML IJ SOLN
1000.0000 ug | Freq: Once | INTRAMUSCULAR | Status: AC
  Administered 2014-11-11: 1000 ug via SUBCUTANEOUS

## 2014-12-09 ENCOUNTER — Ambulatory Visit (INDEPENDENT_AMBULATORY_CARE_PROVIDER_SITE_OTHER): Payer: Medicare Other

## 2014-12-09 DIAGNOSIS — D649 Anemia, unspecified: Secondary | ICD-10-CM

## 2014-12-09 MED ORDER — CYANOCOBALAMIN 1000 MCG/ML IJ SOLN
1000.0000 ug | Freq: Once | INTRAMUSCULAR | Status: AC
  Administered 2014-12-09: 1000 ug via INTRAMUSCULAR

## 2014-12-15 DIAGNOSIS — G35 Multiple sclerosis: Secondary | ICD-10-CM | POA: Diagnosis not present

## 2014-12-23 ENCOUNTER — Other Ambulatory Visit: Payer: Self-pay

## 2014-12-23 MED ORDER — METFORMIN HCL ER 500 MG PO TB24: 500.0000 mg | ORAL_TABLET | Freq: Two times a day (BID) | ORAL | Status: DC

## 2015-01-13 ENCOUNTER — Ambulatory Visit (INDEPENDENT_AMBULATORY_CARE_PROVIDER_SITE_OTHER): Payer: Medicare Other | Admitting: *Deleted

## 2015-01-13 DIAGNOSIS — E538 Deficiency of other specified B group vitamins: Secondary | ICD-10-CM | POA: Diagnosis not present

## 2015-01-13 DIAGNOSIS — Z30013 Encounter for initial prescription of injectable contraceptive: Secondary | ICD-10-CM

## 2015-01-13 MED ORDER — MEDROXYPROGESTERONE ACETATE 150 MG/ML IM SUSP
150.0000 mg | Freq: Once | INTRAMUSCULAR | Status: AC
  Administered 2015-01-13: 150 mg via INTRAMUSCULAR

## 2015-01-13 MED ORDER — CYANOCOBALAMIN 1000 MCG/ML IJ SOLN
1000.0000 ug | Freq: Once | INTRAMUSCULAR | Status: AC
  Administered 2015-01-13: 1000 ug via INTRAMUSCULAR

## 2015-01-16 DIAGNOSIS — G35 Multiple sclerosis: Secondary | ICD-10-CM | POA: Diagnosis not present

## 2015-01-16 DIAGNOSIS — Z880 Allergy status to penicillin: Secondary | ICD-10-CM | POA: Diagnosis not present

## 2015-01-16 DIAGNOSIS — Z881 Allergy status to other antibiotic agents status: Secondary | ICD-10-CM | POA: Diagnosis not present

## 2015-01-16 DIAGNOSIS — E119 Type 2 diabetes mellitus without complications: Secondary | ICD-10-CM | POA: Diagnosis not present

## 2015-01-16 DIAGNOSIS — Z79899 Other long term (current) drug therapy: Secondary | ICD-10-CM | POA: Diagnosis not present

## 2015-01-29 ENCOUNTER — Ambulatory Visit (INDEPENDENT_AMBULATORY_CARE_PROVIDER_SITE_OTHER): Payer: Medicare Other | Admitting: Endocrinology

## 2015-01-29 ENCOUNTER — Encounter: Payer: Self-pay | Admitting: Endocrinology

## 2015-01-29 VITALS — BP 112/86 | HR 97 | Temp 98.1°F | Ht 61.0 in | Wt 201.0 lb

## 2015-01-29 DIAGNOSIS — E119 Type 2 diabetes mellitus without complications: Secondary | ICD-10-CM

## 2015-01-29 LAB — POCT GLYCOSYLATED HEMOGLOBIN (HGB A1C): Hemoglobin A1C: 5.8

## 2015-01-29 NOTE — Patient Instructions (Addendum)
check your blood sugar twice a day.  vary the time of day when you check, between before the 3 meals, and at bedtime.  also check if you have symptoms of your blood sugar being too high or too low.  please keep a record of the readings and bring it to your next appointment here.  You can write it on any piece of paper.  please call us sooner if your blood sugar goes below 70, or if you have a lot of readings over 200, so we can add "invokana."   blood tests are requested for you today.  This would confirm or refute the results of the a1c.  We'll let you know about the results. Please come back for a follow-up appointment in 3 months.

## 2015-01-29 NOTE — Progress Notes (Signed)
Subjective:    Patient ID: Courtney [name], female    DOB: 1975-12-28, 39 y.o.   MRN: [id number]  HPI Pt returns for f/u of diabetes mellitus: DM type: 2 Dx'ed: 2016, when she presented with nonketotic hyperosmolar hyperglycemic state, but not DKA Complications: none Therapy: 2 oral meds GDM: never DKA: never Severe hypoglycemia: never.  Pancreatitis: never.   Other: she took lantus for a brief time in early 2016; metformin dosage is limited by nausea.    Interval history: Pt says the prandin caused n/v, so she went back to the lantus.  she brings a record of her cbg's which i have reviewed today.   It varies from 130-230.  It is in general higher as the day goes on.  She declines weight loss surgery for now.   Past Medical History  Diagnosis Date  . GERD (gastroesophageal reflux disease)   . Fibromyalgia   . Chronic fatigue   . CTS (carpal tunnel syndrome)   . Anemia, B12 deficiency   . Multiple sclerosis ([place])   . Hyperlipidemia   . C. difficile colitis   . Anxiety   . Heart murmur   . VITAMIN D DEFICIENCY 01/13/2010  . OBSTRUCTIVE SLEEP APNEA 12/08/2008  . Irritable bowel syndrome 05/24/2010  . HYPERLIPIDEMIA 03/13/2007  . ESSENTIAL HYPERTENSION 03/13/2007  . CARPAL TUNNEL SYNDROME, BILATERAL 01/13/2010  . ALLERGIC RHINITIS 03/13/2007  . Cervical lesion 07/13/2011    Spine lesion - indeterminate, for f/u MRI may 2013 per neurology  . Diabetes mellitus without complication ([place])   . Allergy   . Sleep apnea     uses c pap  . Hx of adenomatous polyp of colon 06/09/2009    Past Surgical History  Procedure Laterality Date  . Carpal tunnel release  01/2010    bilateral  . Colonoscopy w/ biopsies and polypectomy  05/2009    2 tubular adenomas  . Laparoscopic appendectomy N/A 07/23/2012    Procedure: APPENDECTOMY LAPAROSCOPIC;  Surgeon: Gayland Curry, MD;  Location: [place];  Service: General;  Laterality: N/A;  . Appendectomy  07/23/12    Social History   Social History  .  Marital Status: Married    Spouse Name: N/A  . Number of Children: 2  . Years of Education: N/A   Occupational History  . 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 Smoking status: Never Smoker   . Smokeless tobacco: Never Used  . Alcohol Use: No  . Drug Use: No  . Sexual Activity: Not on file   Other Topics Concern  . Not on file   Social History Narrative   Daily Caffeine    Current Outpatient Prescriptions on File Prior to Visit  Medication Sig Dispense Refill  . baclofen (LIORESAL) 10 MG tablet Take 10 mg by mouth daily at 2 PM daily at 2 PM.    . cetirizine (ZYRTEC) 10 MG tablet Take 10 mg by mouth daily.      . Cholecalciferol (VITAMIN D) 2000 UNITS CAPS Take 1 capsule by mouth every morning.    . cyanocobalamin (,VITAMIN B-12,) 1000 MCG/ML injection Inject 1,000 mcg into the muscle every 30 (thirty) days.    . Dimethyl Fumarate (TECFIDERA) 240 MG CPDR Take by mouth 2 (two) times daily.    . furosemide (LASIX) 40 MG tablet TAKE 1 TABLET DAILY 90 tablet 3  . gabapentin (NEURONTIN) 300 MG capsule Take 300 mg by mouth 3 (three) times daily.     Marland Kitchen  gemfibrozil (LOPID) 600 MG tablet Take 1 tablet (600 mg total) by mouth 2 (two) times daily before a meal. 180 tablet 3  . glucose monitoring kit (FREESTYLE) monitoring kit 1 each by Does not apply route as needed for other. Dispense any model that is covered- dispense testing supplies for Q AC/ HS accuchecks- 1 month supply with one refil. 1 each 1  . INSULIN SYRINGE .5CC/28G 28G X 1/2" 0.5 ML MISC 1 each by Does not apply route 4 (four) times daily -  before meals and at bedtime. 100 each 6  . KLOR-CON M10 10 MEQ tablet TAKE 1 TABLET DAILY 90 tablet 1  . loperamide (IMODIUM) 2 MG capsule Take 2 mg by mouth as needed for diarrhea or loose stools.    . medroxyPROGESTERone (DEPO-PROVERA) 150 MG/ML injection Inject 150 mg into the muscle every 3 (three) months.      . metFORMIN (GLUCOPHAGE-XR) 500 MG 24 hr tablet Take 1  tablet (500 mg total) by mouth 2 (two) times daily. 180 tablet 2  . metoprolol (LOPRESSOR) 100 MG tablet TAKE 2 TABLETS DAILY 180 tablet 1  . nortriptyline (PAMELOR) 50 MG capsule Take 100 mg by mouth at bedtime. At bedtime    . omeprazole-sodium bicarbonate (ZEGERID) 40-1100 MG per capsule Take 1 capsule by mouth daily.      . sitaGLIPtin (JANUVIA) 100 MG tablet Take 1 tablet (100 mg total) by mouth daily. 90 tablet 3   No current facility-administered medications on file prior to visit.    Allergies  Allergen Reactions  . Ciprofloxacin In D5w Rash  . Penicillins     REACTION: rash  . Tricor [Fenofibrate] Rash    Family History  Problem Relation Age of Onset  . Migraines Mother   . Prostate cancer Maternal Uncle     prostate cancer  . Lung cancer Maternal Grandmother   . Stroke Maternal Grandfather   . Heart attack Maternal Grandfather   . Diabetes Other   . Hypertension Other   . Colon cancer Neg Hx   . Esophageal cancer Neg Hx   . Rectal cancer Neg Hx   . Stomach cancer Neg Hx   . Hypertension Father   . COPD Father   . Diabetes Father     BP 112/86 mmHg  Pulse 97  Temp(Src) 98.1 F (36.7 C) (Oral)  Ht _0  (1.549 m)  Wt 201 lb (91.173 kg)  BMI 38.00 kg/m2  SpO2 97%  Review of Systems Denies edema    Objective:   Physical Exam VITAL SIGNS:  See vs page GENERAL: no distress Pulses: dorsalis pedis intact bilat.   MSK: no deformity of the feet CV: no leg edema Skin:  no ulcer on the feet.  normal color and temp on the feet. Neuro: sensation is intact to touch on the feet   A1c=5.8%     Assessment & Plan:  DM: pt feels her cbg's suggest a much higher a1c than this.    Patient is advised the following: Patient Instructions  check your blood sugar twice a day.  vary the time of day when you check, between before the 3 meals, and at bedtime.  also check if you have symptoms of your blood sugar being too high or too low.  please keep a record of the  readings and bring it to your next appointment here.  You can write it on any piece of paper.  please call us sooner if your blood sugar goes below 70,  or if you have a lot of readings over 200, so we can add "invokana."   blood tests are requested for you today.  This would confirm or refute the results of the a1c.  We'll let you know about the results. Please come back for a follow-up appointment in 3 months.

## 2015-02-02 LAB — FRUCTOSAMINE: Fructosamine: 223 umol/L (ref 190–270)

## 2015-02-13 ENCOUNTER — Ambulatory Visit (INDEPENDENT_AMBULATORY_CARE_PROVIDER_SITE_OTHER): Payer: Medicare Other | Admitting: Internal Medicine

## 2015-02-13 ENCOUNTER — Encounter: Payer: Self-pay | Admitting: Internal Medicine

## 2015-02-13 ENCOUNTER — Other Ambulatory Visit (INDEPENDENT_AMBULATORY_CARE_PROVIDER_SITE_OTHER): Payer: Medicare Other

## 2015-02-13 VITALS — BP 108/74 | HR 78 | Temp 98.6°F | Ht 61.0 in | Wt 204.0 lb

## 2015-02-13 DIAGNOSIS — I10 Essential (primary) hypertension: Secondary | ICD-10-CM

## 2015-02-13 DIAGNOSIS — M25561 Pain in right knee: Secondary | ICD-10-CM

## 2015-02-13 DIAGNOSIS — Z23 Encounter for immunization: Secondary | ICD-10-CM | POA: Diagnosis not present

## 2015-02-13 DIAGNOSIS — E785 Hyperlipidemia, unspecified: Secondary | ICD-10-CM

## 2015-02-13 DIAGNOSIS — E538 Deficiency of other specified B group vitamins: Secondary | ICD-10-CM

## 2015-02-13 DIAGNOSIS — M25562 Pain in left knee: Secondary | ICD-10-CM | POA: Diagnosis not present

## 2015-02-13 LAB — BASIC METABOLIC PANEL
BUN: 12 mg/dL (ref 6–23)
CALCIUM: 10.2 mg/dL (ref 8.4–10.5)
CHLORIDE: 103 meq/L (ref 96–112)
CO2: 30 meq/L (ref 19–32)
CREATININE: 0.74 mg/dL (ref 0.40–1.20)
GFR: 92.98 mL/min (ref >60.00)
Glucose, Bld: 92 mg/dL (ref 70–99)
Potassium: 4.1 mEq/L (ref 3.5–5.1)
SODIUM: 142 meq/L (ref 135–145)

## 2015-02-13 LAB — HEPATIC FUNCTION PANEL
ALBUMIN: 4.3 g/dL (ref 3.5–5.2)
ALT: 10 U/L (ref 0–35)
AST: 12 U/L (ref 0–37)
Alkaline Phosphatase: 56 U/L (ref 39–117)
Bilirubin, Direct: 0.1 mg/dL (ref 0.0–0.3)
TOTAL PROTEIN: 7.9 g/dL (ref 6.0–8.3)
Total Bilirubin: 0.4 mg/dL (ref 0.2–1.2)

## 2015-02-13 LAB — LIPID PANEL
CHOL/HDL RATIO: 5
CHOLESTEROL: 138 mg/dL (ref 0–200)
HDL: 28.6 mg/dL — AB (ref >39.00)
NONHDL: 109.34
TRIGLYCERIDES: 214 mg/dL — AB (ref 0.0–149.0)
VLDL: 42.8 mg/dL — ABNORMAL HIGH (ref 0.0–40.0)

## 2015-02-13 LAB — LDL CHOLESTEROL, DIRECT: LDL DIRECT: 76 mg/dL

## 2015-02-13 MED ORDER — DICLOFENAC SODIUM 1 % TD GEL: 4.0000 g | Freq: Four times a day (QID) | TRANSDERMAL | Status: DC | PRN

## 2015-02-13 MED ORDER — CYANOCOBALAMIN 1000 MCG/ML IJ SOLN
1000.0000 ug | Freq: Once | INTRAMUSCULAR | Status: AC
  Administered 2015-02-13: 1000 ug via INTRAMUSCULAR

## 2015-02-13 NOTE — Progress Notes (Signed)
Pre visit review using our clinic review tool, if applicable. No additional management support is needed unless otherwise documented below in the visit note. 

## 2015-02-13 NOTE — Progress Notes (Signed)
Subjective:    Patient ID: Courtney [name], female    DOB: 1975-12-31, 39 y.o.   MRN: [id number]  HPI  Here for yearly f/u;  Overall doing ok;  Pt denies Chest pain, worsening SOB, DOE, wheezing, orthopnea, PND, worsening LE edema, palpitations, dizziness or syncope.  Pt denies neurological change such as new headache, facial or extremity weakness.  Pt denies polydipsia, polyuria, or low sugar symptoms. Pt states overall good compliance with treatment and medications, good tolerability, and has been trying to follow appropriate diet.  Pt denies worsening depressive symptoms, suicidal ideation or panic. No fever, night sweats, wt loss, loss of appetite, or other constitutional symptoms.  Pt states good ability with ADL's, has low fall risk, home safety reviewed and adequate, no other significant changes in hearing or vision, and only occasionally active with exercise. Had recent meningioma brain slightly enlarged, no surgical or other plans. Had C-spine MRI with some plaque noted. C/o bilat knee pan and swelling worsening in the past several weeks that has slowed her down more than the MS, worse pain to walk, better to sit, but swelling will not resolve. Has had several follow up with endo, with excellent improvement in a1c.  Due for lipid f/u Past Medical History  Diagnosis Date  . GERD (gastroesophageal reflux disease)   . Fibromyalgia   . Chronic fatigue   . CTS (carpal tunnel syndrome)   . Anemia, B12 deficiency   . Multiple sclerosis ([place])   . Hyperlipidemia   . C. difficile colitis   . Anxiety   . Heart murmur   . VITAMIN D DEFICIENCY 01/13/2010  . OBSTRUCTIVE SLEEP APNEA 12/08/2008  . Irritable bowel syndrome 05/24/2010  . HYPERLIPIDEMIA 03/13/2007  . ESSENTIAL HYPERTENSION 03/13/2007  . CARPAL TUNNEL SYNDROME, BILATERAL 01/13/2010  . ALLERGIC RHINITIS 03/13/2007  . Cervical lesion 07/13/2011    Spine lesion - indeterminate, for f/u MRI may 2013 per neurology  . Diabetes mellitus without  complication ([place])   . Allergy   . Sleep apnea     uses c pap  . Hx of adenomatous polyp of colon 06/09/2009   Past Surgical History  Procedure Laterality Date  . Carpal tunnel release  01/2010    bilateral  . Colonoscopy w/ biopsies and polypectomy  05/2009    2 tubular adenomas  . Laparoscopic appendectomy N/A 07/23/2012    Procedure: APPENDECTOMY LAPAROSCOPIC;  Surgeon: Gayland Curry, MD;  Location: [facility];  Service: General;  Laterality: N/A;  . Appendectomy  07/23/12    reports that she has never smoked. She has never used smokeless tobacco. She reports that she does not drink alcohol or use illicit drugs. family history includes COPD in her father; Diabetes in her father and other; Heart attack in her maternal grandfather; Hypertension in her father and other; Lung cancer in her maternal grandmother; Migraines in her mother; Prostate cancer in her maternal uncle; Stroke in her maternal grandfather. There is no history of Colon cancer, Esophageal cancer, Rectal cancer, or Stomach cancer. Allergies  Allergen Reactions  . Ciprofloxacin In D5w Rash  . Penicillins     REACTION: rash  . Tricor [Fenofibrate] Rash   Current Outpatient Prescriptions on File Prior to Visit  Medication Sig Dispense Refill  . baclofen (LIORESAL) 10 MG tablet Take 10 mg by mouth daily at 2 PM daily at 2 PM.    . cetirizine (ZYRTEC) 10 MG tablet Take 10 mg by mouth daily.      . Cholecalciferol (  VITAMIN D) 2000 UNITS CAPS Take 1 capsule by mouth every morning.    . cyanocobalamin (,VITAMIN B-12,) 1000 MCG/ML injection Inject 1,000 mcg into the muscle every 30 (thirty) days.    . Dimethyl Fumarate (TECFIDERA) 240 MG CPDR Take by mouth 2 (two) times daily.    . furosemide (LASIX) 40 MG tablet TAKE 1 TABLET DAILY 90 tablet 3  . gabapentin (NEURONTIN) 300 MG capsule Take 300 mg by mouth 3 (three) times daily.     Marland Kitchen gemfibrozil (LOPID) 600 MG tablet Take 1 tablet (600 mg total) by mouth 2 (two) times daily before a  meal. 180 tablet 3  . glucose monitoring kit (FREESTYLE) monitoring kit 1 each by Does not apply route as needed for other. Dispense any model that is covered- dispense testing supplies for Q AC/ HS accuchecks- 1 month supply with one refil. 1 each 1  . INSULIN SYRINGE .5CC/28G 28G X 1/2" 0.5 ML MISC 1 each by Does not apply route 4 (four) times daily -  before meals and at bedtime. 100 each 6  . KLOR-CON M10 10 MEQ tablet TAKE 1 TABLET DAILY 90 tablet 1  . loperamide (IMODIUM) 2 MG capsule Take 2 mg by mouth as needed for diarrhea or loose stools.    . medroxyPROGESTERone (DEPO-PROVERA) 150 MG/ML injection Inject 150 mg into the muscle every 3 (three) months.      . metFORMIN (GLUCOPHAGE-XR) 500 MG 24 hr tablet Take 1 tablet (500 mg total) by mouth 2 (two) times daily. 180 tablet 2  . metoprolol (LOPRESSOR) 100 MG tablet TAKE 2 TABLETS DAILY 180 tablet 1  . nortriptyline (PAMELOR) 50 MG capsule Take 100 mg by mouth at bedtime. At bedtime    . omeprazole-sodium bicarbonate (ZEGERID) 40-1100 MG per capsule Take 1 capsule by mouth daily.      . sitaGLIPtin (JANUVIA) 100 MG tablet Take 1 tablet (100 mg total) by mouth daily. 90 tablet 3   No current facility-administered medications on file prior to visit.   Review of Systems Constitutional: Negative for increased diaphoresis, other activity, appetite or siginficant weight change other than noted HENT: Negative for worsening hearing loss, ear pain, facial swelling, mouth sores and neck stiffness.   Eyes: Negative for other worsening pain, redness or visual disturbance.  Respiratory: Negative for shortness of breath and wheezing  Cardiovascular: Negative for chest pain and palpitations.  Gastrointestinal: Negative for diarrhea, blood in stool, abdominal distention or other pain Genitourinary: Negative for hematuria, flank pain or change in urine volume.  Musculoskeletal: Negative for myalgias or other joint complaints.  Skin: Negative for color  change and wound or drainage.  Neurological: Negative for syncope and numbness. other than noted Hematological: Negative for adenopathy. or other swelling Psychiatric/Behavioral: Negative for hallucinations, SI, self-injury, decreased concentration or other worsening agitation.      Objective:   Physical Exam BP 108/74 mmHg  Pulse 78  Temp(Src) 98.6 F (37 C) (Oral)  Ht '5\' 1"'  (1.549 m)  Wt 204 lb (92.534 kg)  BMI 38.57 kg/m2  SpO2 98% VS noted,  Constitutional: Pt is oriented to person, place, and time. Appears well-developed and well-nourished, in no significant distress Head: Normocephalic and atraumatic.  Right Ear: External ear normal.  Left Ear: External ear normal.  Nose: Nose normal.  Mouth/Throat: Oropharynx is clear and moist.  Eyes: Conjunctivae and EOM are normal. Pupils are equal, round, and reactive to light.  Neck: Normal range of motion. Neck supple. No JVD present. No tracheal deviation  present or significant neck LA or mass Cardiovascular: Normal rate, regular rhythm, normal heart sounds and intact distal pulses.   Pulmonary/Chest: Effort normal and breath sounds without rales or wheezing  Abdominal: Soft. Bowel sounds are normal. NT. No HSM  Musculoskeletal: Normal range of motion. Exhibits no edema.  Lymphadenopathy:  Has no cervical adenopathy.  Neurological: Pt is alert and oriented to person, place, and time. Pt has normal reflexes. No cranial nerve deficit. Motor grossly intact o/w not done in detail Skin: Skin is warm and dry. No rash noted.  bilat knees with trace effusions, right with FROM, left with decrased ROM to full extnesion by approx 10 degress, neg lockmans, has bilat crepitus Psychiatric:  Has normal mood and affect. Behavior is normal.     Assessment & Plan:

## 2015-02-13 NOTE — Assessment & Plan Note (Signed)
stable overall by history and exam, recent data reviewed with pt, and pt to continue medical treatment as before,  to f/u any worsening symptoms or concerns BP Readings from Last 3 Encounters:  02/13/15 108/74  01/29/15 112/86  10/29/14 124/82

## 2015-02-13 NOTE — Assessment & Plan Note (Signed)
C/w bilat OA likely, with bilat effusion - for volt gel, also refer sports medicine this office for f/u

## 2015-02-13 NOTE — Patient Instructions (Signed)
You had the b12 and flu shot today, and the Pneumovax shot today  We will plan on given the Prevnar 13 pneumonia shot later  Please take all new medication as prescribed - the voltaren gel  You will be contacted regarding the referral for: Dr Smith/sports medicine for the knees  Please continue all other medications as before, and refills have been done if requested.  Please have the pharmacy call with any other refills you may need.  Please continue your efforts at being more active, low cholesterol diet, and weight control.  You are otherwise up to date with prevention measures today.  Please keep your appointments with your specialists as you may have planned  Please go to the LAB in the Basement (turn left off the elevator) for the tests to be done today  You will be contacted by phone if any changes need to be made immediately.  Otherwise, you will receive a letter about your results with an explanation, but please check with MyChart first.  Please remember to sign up for MyChart if you have not done so, as this will be important to you in the future with finding out test results, communicating by private email, and scheduling acute appointments online when needed.  Please return in 1 year for your yearly visit, or sooner if needed

## 2015-02-13 NOTE — Assessment & Plan Note (Signed)
Uncontrolled last visit Lab Results  Component Value Date   CHOL 210* 04/22/2014   HDL 33.10* 04/22/2014   LDLDIRECT 116.4 04/22/2014   TRIG * 04/22/2014    502.0 Triglyceride is over 400; calculations on Lipids are invalid.   CHOLHDL 6 04/22/2014   But with improved a1c suspect tg and LDL to improve as well , for f/u labs

## 2015-02-25 ENCOUNTER — Encounter: Payer: Self-pay | Admitting: Family Medicine

## 2015-02-25 ENCOUNTER — Ambulatory Visit (INDEPENDENT_AMBULATORY_CARE_PROVIDER_SITE_OTHER): Payer: Medicare Other | Admitting: Family Medicine

## 2015-02-25 ENCOUNTER — Other Ambulatory Visit (INDEPENDENT_AMBULATORY_CARE_PROVIDER_SITE_OTHER): Payer: Medicare Other

## 2015-02-25 VITALS — BP 118/80 | HR 69 | Ht 61.0 in | Wt 201.0 lb

## 2015-02-25 DIAGNOSIS — M25569 Pain in unspecified knee: Secondary | ICD-10-CM

## 2015-02-25 DIAGNOSIS — M13169 Monoarthritis, not elsewhere classified, unspecified knee: Secondary | ICD-10-CM | POA: Diagnosis not present

## 2015-02-25 DIAGNOSIS — M1710 Unilateral primary osteoarthritis, unspecified knee: Secondary | ICD-10-CM | POA: Insufficient documentation

## 2015-02-25 MED ORDER — VITAMIN D (ERGOCALCIFEROL) 1.25 MG (50000 UNIT) PO CAPS: 50000.0000 [IU] | ORAL_CAPSULE | ORAL | Status: DC

## 2015-02-25 NOTE — Assessment & Plan Note (Signed)
Discussed with patient about conservative therapy. Patient elected to try this instead of any type of injection. I do think the patient's underlying multiple sclerosis discussed some weakness as well as some atrophy of the musculature that likely is contributing to it. We discussed with patient about strengthening, home exercises, topical anti-inflammatories, over-the-counter natural supplementations. X-rays ordered today for further evaluation. Patient given a patella stabilizing brace to see if this will be beneficial. Patient and will come back and see me again in 3 weeks. If continuing have trouble I would encourage her to try an injection. Patient is a refill to formal physical therapy. She could be a candidate for viscous supplementation in the long run. She may also need surgical intervention at some point.

## 2015-02-25 NOTE — Progress Notes (Signed)
[name] [place], [place] [address] Phone: [phone] Subjective:    I'm seeing this patient by the request  of:  Cathlean Cower, MD   CC: Bilateral knee pain  [id number] Courtney [name] is a 39 y.o. female coming in with complaint of bilateral knee pain. She has had this pain for quite some time. She has had workup before and an outside facility. She states that she did have an MRI of her knee recently. MRI showed the patient did have moderate to severe arthritis. Patient states that she was told that only option she had was potential injection. Patient is here for a second opinion. States that the pain is severe when going up and down stairs and walking a long amount of time. Patient's past medical history significant for multiple sclerosis. Patient is only tried some icing and pain medication without any significant improvement. Patient would like to remain active but finds it difficult secondary to the pain.     Past Medical History  Diagnosis Date  . GERD (gastroesophageal reflux disease)   . Fibromyalgia   . Chronic fatigue   . CTS (carpal tunnel syndrome)   . Anemia, B12 deficiency   . Multiple sclerosis ([place])   . Hyperlipidemia   . C. difficile colitis   . Anxiety   . Heart murmur   . VITAMIN D DEFICIENCY 01/13/2010  . OBSTRUCTIVE SLEEP APNEA 12/08/2008  . Irritable bowel syndrome 05/24/2010  . HYPERLIPIDEMIA 03/13/2007  . ESSENTIAL HYPERTENSION 03/13/2007  . CARPAL TUNNEL SYNDROME, BILATERAL 01/13/2010  . ALLERGIC RHINITIS 03/13/2007  . Cervical lesion 07/13/2011    Spine lesion - indeterminate, for f/u MRI may 2013 per neurology  . Diabetes mellitus without complication ([place])   . Allergy   . Sleep apnea     uses c pap  . Hx of adenomatous polyp of colon 06/09/2009   Past Surgical History  Procedure Laterality Date  . Carpal tunnel release  01/2010    bilateral  . Colonoscopy w/ biopsies and polypectomy  05/2009    2  tubular adenomas  . Laparoscopic appendectomy N/A 07/23/2012    Procedure: APPENDECTOMY LAPAROSCOPIC;  Surgeon: Gayland Curry, MD;  Location: [facility];  Service: General;  Laterality: N/A;  . Appendectomy  07/23/12   Social History  Substance Use Topics  . Smoking status: Never Smoker   . Smokeless tobacco: Never Used  . Alcohol Use: No   Allergies  Allergen Reactions  . Ciprofloxacin In D5w Rash  . Penicillins     REACTION: rash  . Tricor [Fenofibrate] Rash   Family History  Problem Relation Age of Onset  . Migraines Mother   . Prostate cancer Maternal Uncle     prostate cancer  . Lung cancer Maternal Grandmother   . Stroke Maternal Grandfather   . Heart attack Maternal Grandfather   . Diabetes Other   . Hypertension Other   . Colon cancer Neg Hx   . Esophageal cancer Neg Hx   . Rectal cancer Neg Hx   . Stomach cancer Neg Hx   . Hypertension Father   . COPD Father   . Diabetes Father      Past medical history, social, surgical and family history all reviewed in electronic medical record.   Review of Systems: No headache, visual changes, nausea, vomiting, diarrhea, constipation, dizziness, abdominal pain, skin rash, fevers, chills, night sweats, weight loss, swollen lymph nodes, body aches, joint swelling, muscle aches, chest pain,  shortness of breath, mood changes.   Objective Blood pressure 118/80, pulse 69, height 5\' 1"  (1.549 m), weight 201 lb (91.173 kg), SpO2 94 %.  General: No apparent distress alert and oriented x3 mood and affect normal, dressed appropriately.  HEENT: Pupils equal, extraocular movements intact  Respiratory: Patient's speak in full sentences and does not appear short of breath  Cardiovascular: No lower extremity edema, non tender, no erythema  Skin: Warm dry intact with no signs of infection or rash on extremities or on axial skeleton.  Abdomen: Soft nontender  Neuro: Cranial nerves II through XII are intact, neurovascularly intact in all  extremities with 2+ DTRs and 2+ pulses.  Lymph: No lymphadenopathy of posterior or anterior cervical chain or axillae bilaterally.  Gait mild antalgic gait MSK:  Non tender with full range of motion and good stability and symmetric strength and tone of shoulders, elbows, wrist, hip, and ankles bilaterally.  Knee: Bilateral Mild patella alta. Tenderness to palpation over the patella on the lateral aspect as well as somewhat over the medial joint line bilaterally ROM full in flexion and extension and lower leg rotation. Ligaments with solid consistent endpoints including ACL, PCL, LCL, MCL. Mild pain with Thessaly. Patellar glide without crepitus. Patellar and quadriceps tendons unremarkable. Hamstring and quadriceps strength is normal.    MSK US performed of: Bilateral This study was ordered, performed, and interpreted by Charlann Boxer D.O.  Knee: All structures visualized. Moderate narrowing of the medial joint line bilaterally as well as severe narrowing of the patellofemoral joint. Patellar Tendon unremarkable on long and transverse views without effusion. No abnormality of prepatellar bursa. LCL and MCL unremarkable on long and transverse views. No abnormality of origin of medial or lateral head of the gastrocnemius.  IMPRESSION:  Severe patellofemoral arthritis   Procedure note 44034; 15 minutes spent for Therapeutic exercises as stated in above notes.  This included exercises focusing on stretching, strengthening, with significant focus on eccentric aspects.Patellofemoral Syndrome  Reviewed anatomy using anatomical model and how PFS occurs.  Given rehab exercises handout for VMO, hip abductors, core, entire kinetic chain including proprioception exercises including cone touches, step downs, hip elevations and turn outs.  Could benefit from PT, regular exercise, upright biking, and a PFS knee brace to assist with tracking abnormalities.    Proper technique shown and discussed  handout in great detail with ATC.  All questions were discussed and answered.     Impression and Recommendations:     This case required medical decision making of moderate complexity.

## 2015-02-25 NOTE — Patient Instructions (Addendum)
Good to see you.  Ice 20 minutes 2 times daily. Usually after activity and before bed. Exercises 3 times a week.  Try brace on left knee pennsaid pinkie amount topically 2 times daily as needed.  Vitamin D once weekly  Turmeric 500mg  twice daily Good shoes with rigid bottom.  Courtney [name], [place] or New balance greater then 700 See me again in 4 weeks.

## 2015-02-25 NOTE — Progress Notes (Signed)
Pre visit review using our clinic review tool, if applicable. No additional management support is needed unless otherwise documented below in the visit note. 

## 2015-03-02 ENCOUNTER — Telehealth: Payer: Self-pay

## 2015-03-02 ENCOUNTER — Ambulatory Visit: Payer: Medicare Other

## 2015-03-02 NOTE — Telephone Encounter (Signed)
Left voicemail explaining that patient was just here one month ago and recd pneumo 23 vaccine, it is too early to get prevnar 13 vaccine (these 2 vaccines need to be one year apart), i could not find any notes in dr Judi Cong office visit that would suggest patient needs to be seen (nurse visit) today to get prevnar 13 this soon----advised patient to call me back, her appt needs to be cancelled for today and she can get prevnar 13 in one year at her next annual physical---if any questions, patient needs to talk with tamara

## 2015-03-03 ENCOUNTER — Telehealth: Payer: Self-pay

## 2015-03-03 ENCOUNTER — Other Ambulatory Visit: Payer: Self-pay | Admitting: Internal Medicine

## 2015-03-03 NOTE — Telephone Encounter (Signed)
Patient did call back, and per dr Jenny Reichmann, prevnar needs to be 1 year after pneumo vaccine, patient advised

## 2015-03-17 ENCOUNTER — Ambulatory Visit (INDEPENDENT_AMBULATORY_CARE_PROVIDER_SITE_OTHER): Payer: Medicare Other

## 2015-03-17 DIAGNOSIS — E538 Deficiency of other specified B group vitamins: Secondary | ICD-10-CM

## 2015-03-17 MED ORDER — CYANOCOBALAMIN 1000 MCG/ML IJ SOLN
1000.0000 ug | Freq: Once | INTRAMUSCULAR | Status: AC
  Administered 2015-03-17: 1000 ug via INTRAMUSCULAR

## 2015-03-25 ENCOUNTER — Encounter: Payer: Self-pay | Admitting: Family Medicine

## 2015-03-25 ENCOUNTER — Ambulatory Visit (INDEPENDENT_AMBULATORY_CARE_PROVIDER_SITE_OTHER): Payer: Medicare Other | Admitting: Family Medicine

## 2015-03-25 VITALS — BP 108/78 | HR 76 | Wt 205.0 lb

## 2015-03-25 DIAGNOSIS — M13169 Monoarthritis, not elsewhere classified, unspecified knee: Secondary | ICD-10-CM | POA: Diagnosis not present

## 2015-03-25 NOTE — Progress Notes (Signed)
[name] [place], [place] [address] Phone: [phone] Subjective:     CC: Bilateral knee pain follow up  Courtney [name] Courtney [name] is a 39 y.o. female coming in with complaint of bilateral knee pain. She has had this pain for quite some time. She has had workup before and an outside facility. She states that she did have an MRI of her knee recently. MRI showed the patient did have moderate to severe arthritis.  Patient was seen by me and patient did have severe patellofemoral arthritis of the knee. Patient elected continue with conservative therapy. Patient was on doing home exercises, icing, bracing and topical anti-inflammatories. We discussed over-the-counter natural supplementations. Patient states she is approximately 75% better. Still has some mild discomfort especially going up and down stairs. States that the brace for her left knee has been significantly improving. Denies any locking or giving out on her. Denies that the knees are stopping her from any activities at this time.     Past Medical History  Diagnosis Date  . GERD (gastroesophageal reflux disease)   . Fibromyalgia   . Chronic fatigue   . CTS (carpal tunnel syndrome)   . Anemia, B12 deficiency   . Multiple sclerosis ([place])   . Hyperlipidemia   . C. difficile colitis   . Anxiety   . Heart murmur   . VITAMIN D DEFICIENCY 01/13/2010  . OBSTRUCTIVE SLEEP APNEA 12/08/2008  . Irritable bowel syndrome 05/24/2010  . HYPERLIPIDEMIA 03/13/2007  . ESSENTIAL HYPERTENSION 03/13/2007  . CARPAL TUNNEL SYNDROME, BILATERAL 01/13/2010  . ALLERGIC RHINITIS 03/13/2007  . Cervical lesion 07/13/2011    Spine lesion - indeterminate, for f/u MRI may 2013 per neurology  . Diabetes mellitus without complication ([place])   . Allergy   . Sleep apnea     uses c pap  . Hx of adenomatous polyp of colon 06/09/2009   Past Surgical History  Procedure Laterality Date  . Carpal tunnel release  01/2010     bilateral  . Colonoscopy w/ biopsies and polypectomy  05/2009    2 tubular adenomas  . Laparoscopic appendectomy N/A 07/23/2012    Procedure: APPENDECTOMY LAPAROSCOPIC;  Surgeon: Gayland Curry, MD;  Location: [facility];  Service: General;  Laterality: N/A;  . Appendectomy  07/23/12   Social History  Substance Use Topics  . Smoking status: Never Smoker   . Smokeless tobacco: Never Used  . Alcohol Use: No   Allergies  Allergen Reactions  . Ciprofloxacin In D5w Rash  . Penicillins     REACTION: rash  . Tricor [Fenofibrate] Rash   Family History  Problem Relation Age of Onset  . Migraines Mother   . Prostate cancer Maternal Uncle     prostate cancer  . Lung cancer Maternal Grandmother   . Stroke Maternal Grandfather   . Heart attack Maternal Grandfather   . Diabetes Other   . Hypertension Other   . Colon cancer Neg Hx   . Esophageal cancer Neg Hx   . Rectal cancer Neg Hx   . Stomach cancer Neg Hx   . Hypertension Father   . COPD Father   . Diabetes Father      Past medical history, social, surgical and family history all reviewed in electronic medical record.   Review of Systems: No headache, visual changes, nausea, vomiting, diarrhea, constipation, dizziness, abdominal pain, skin rash, fevers, chills, night sweats, weight loss, swollen lymph nodes, body aches, joint swelling, muscle aches,  chest pain, shortness of breath, mood changes.   Objective Blood pressure 108/78, pulse 76, weight 205 lb (92.987 kg), SpO2 98 %.  General: No apparent distress alert and oriented x3 mood and affect normal, dressed appropriately.  HEENT: Pupils equal, extraocular movements intact  Respiratory: Patient's speak in full sentences and does not appear short of breath  Cardiovascular: No lower extremity edema, non tender, no erythema  Skin: Warm dry intact with no signs of infection or rash on extremities or on axial skeleton.  Abdomen: Soft nontender  Neuro: Cranial nerves II through XII are  intact, neurovascularly intact in all extremities with 2+ DTRs and 2+ pulses.  Lymph: No lymphadenopathy of posterior or anterior cervical chain or axillae bilaterally.  Gait mild antalgic gait MSK:  Non tender with full range of motion and good stability and symmetric strength and tone of shoulders, elbows, wrist, hip, and ankles bilaterally.  Knee: Bilateral Mild patella alta. Less tenderness than previous exam but still over the patellofemoral joint ROM full in flexion and extension and lower leg rotation. Ligaments with solid consistent endpoints including ACL, PCL, LCL, MCL. Decreased pain with Thessaly Patellar glide without significant crepitus Patellar and quadriceps tendons unremarkable. Hamstring and quadriceps strength is normal.        Impression and Recommendations:     This case required medical decision making of moderate complexity.

## 2015-03-25 NOTE — Assessment & Plan Note (Signed)
Patient does have the arthritis and is likely secondary to more of a multiple sclerosis and the underlying weakness. Patient will be given a brace for her right knee. Patient will continue with the conservative therapy at this time. We discussed continuing the vitamin D supplementation and how this is important for her underlying comorbidities. His lungs patient continues to do well at like to see her again in 6 weeks. Injections are always a possibility if necessary.

## 2015-03-25 NOTE — Patient Instructions (Signed)
Great to see you Courtney [name] is your friend Do your exercises and continue the vitamins.  You are doing great!!! Stay active Keep wearing good shoes.  See me again in 6 weeks if not better than 85% We will call when we get the brace.

## 2015-03-26 ENCOUNTER — Telehealth: Payer: Self-pay

## 2015-03-26 NOTE — Telephone Encounter (Signed)
Left message stating that a brace for her right knee will be left at the front desk as well as paperwork for her to sign. Can pick up at her earliest convenience

## 2015-04-16 ENCOUNTER — Ambulatory Visit (INDEPENDENT_AMBULATORY_CARE_PROVIDER_SITE_OTHER): Payer: Medicare Other

## 2015-04-16 DIAGNOSIS — Z30013 Encounter for initial prescription of injectable contraceptive: Secondary | ICD-10-CM | POA: Diagnosis not present

## 2015-04-16 DIAGNOSIS — Z3200 Encounter for pregnancy test, result unknown: Secondary | ICD-10-CM

## 2015-04-16 DIAGNOSIS — E538 Deficiency of other specified B group vitamins: Secondary | ICD-10-CM | POA: Diagnosis not present

## 2015-04-16 LAB — POCT PREGNANCY, URINE: PREG TEST UR: NEGATIVE

## 2015-04-16 MED ORDER — CYANOCOBALAMIN 1000 MCG/ML IJ SOLN
1000.0000 ug | Freq: Once | INTRAMUSCULAR | Status: AC
  Administered 2015-04-16: 1000 ug via INTRAMUSCULAR

## 2015-04-16 MED ORDER — MEDROXYPROGESTERONE ACETATE 150 MG/ML IM SUSP
150.0000 mg | Freq: Once | INTRAMUSCULAR | Status: AC
  Administered 2015-04-16: 150 mg via INTRAMUSCULAR

## 2015-05-06 ENCOUNTER — Ambulatory Visit (INDEPENDENT_AMBULATORY_CARE_PROVIDER_SITE_OTHER): Payer: Medicare Other | Admitting: Family Medicine

## 2015-05-06 ENCOUNTER — Ambulatory Visit (INDEPENDENT_AMBULATORY_CARE_PROVIDER_SITE_OTHER)
Admission: RE | Admit: 2015-05-06 | Discharge: 2015-05-06 | Disposition: A | Discharge status: Home / Self Care | Payer: Medicare Other | Source: Ambulatory Visit | Attending: Family Medicine | Admitting: Family Medicine

## 2015-05-06 ENCOUNTER — Encounter: Payer: Self-pay | Admitting: Family Medicine

## 2015-05-06 VITALS — BP 114/82 | HR 84 | Ht 61.0 in | Wt 206.0 lb

## 2015-05-06 DIAGNOSIS — M179 Osteoarthritis of knee, unspecified: Secondary | ICD-10-CM | POA: Diagnosis not present

## 2015-05-06 DIAGNOSIS — M13169 Monoarthritis, not elsewhere classified, unspecified knee: Secondary | ICD-10-CM

## 2015-05-06 DIAGNOSIS — Q682 Congenital deformity of knee: Secondary | ICD-10-CM | POA: Diagnosis not present

## 2015-05-06 DIAGNOSIS — M25562 Pain in left knee: Secondary | ICD-10-CM | POA: Diagnosis not present

## 2015-05-06 NOTE — Assessment & Plan Note (Signed)
Patient does have an severe and does have seem to have further exacerbation. I do believe that with patient's multiple sclerosis she's had some weakness over the course of time. Encourage her to go to formal physical therapy and she was referred today. We discussed the possibility of needing injections to decrease any inflammation. Patient declined today. We discussed continuing home exercises, proper shoes, the patient will start her topical anti-inflammatory. Patient then will come back and see me again in 4-6 weeks for further evaluation.  Spent  25 minutes with patient face-to-face and had greater than 50% of counseling including as described above in assessment and plan.

## 2015-05-06 NOTE — Progress Notes (Signed)
[name], [place] [address] Phone: [phone] Subjective:     CC: Bilateral knee pain follow up  [id number] DARCY Courtney [name] is a 40 y.o. female coming in with complaint of bilateral knee pain. She has had this pain for quite some time. She has had workup before and an outside facility. She states that she did have an MRI of her knee recently. MRI showed the patient did have moderate to severe arthritis.  Patient was seen by me and patient did have severe patellofemoral arthritis of the knee. Patient elected continue with conservative therapy.  Patient at last follow-up was doing a proximal a 75% better. Started having increasing discomfort again. Patient states that she may be 10% worse when she was previously. Starting have the discomfort more on the left side of the right side. Up and down stairs have been very difficult. Think she was more active during the holiday and this has made it uncomfortable. No giving out on her any signs of internal derangement. No nighttime awakening. No signs of flares of her multiple sclerosis.     Past Medical History  Diagnosis Date  . GERD (gastroesophageal reflux disease)   . Fibromyalgia   . Chronic fatigue   . CTS (carpal tunnel syndrome)   . Anemia, B12 deficiency   . Multiple sclerosis ([place])   . Hyperlipidemia   . C. difficile colitis   . Anxiety   . Heart murmur   . VITAMIN D DEFICIENCY 01/13/2010  . OBSTRUCTIVE SLEEP APNEA 12/08/2008  . Irritable bowel syndrome 05/24/2010  . HYPERLIPIDEMIA 03/13/2007  . ESSENTIAL HYPERTENSION 03/13/2007  . CARPAL TUNNEL SYNDROME, BILATERAL 01/13/2010  . ALLERGIC RHINITIS 03/13/2007  . Cervical lesion 07/13/2011    Spine lesion - indeterminate, for f/u MRI may 2013 per neurology  . Diabetes mellitus without complication ([place])   . Allergy   . Sleep apnea     uses c pap  . Hx of adenomatous polyp of colon 06/09/2009   Past Surgical History  Procedure  Laterality Date  . Carpal tunnel release  01/2010    bilateral  . Colonoscopy w/ biopsies and polypectomy  05/2009    2 tubular adenomas  . Laparoscopic appendectomy N/A 07/23/2012    Procedure: APPENDECTOMY LAPAROSCOPIC;  Surgeon: Gayland Curry, MD;  Location: [facility];  Service: General;  Laterality: N/A;  . Appendectomy  07/23/12   Social History  Substance Use Topics  . Smoking status: Never Smoker   . Smokeless tobacco: Never Used  . Alcohol Use: No   Allergies  Allergen Reactions  . Ciprofloxacin In D5w Rash  . Penicillins     REACTION: rash  . Tricor [Fenofibrate] Rash   Family History  Problem Relation Age of Onset  . Migraines Mother   . Prostate cancer Maternal Uncle     prostate cancer  . Lung cancer Maternal Grandmother   . Stroke Maternal Grandfather   . Heart attack Maternal Grandfather   . Diabetes Other   . Hypertension Other   . Colon cancer Neg Hx   . Esophageal cancer Neg Hx   . Rectal cancer Neg Hx   . Stomach cancer Neg Hx   . Hypertension Father   . COPD Father   . Diabetes Father      Past medical history, social, surgical and family history all reviewed in electronic medical record.   Review of Systems: No headache, visual changes, nausea, vomiting, diarrhea, constipation, dizziness, abdominal  pain, skin rash, fevers, chills, night sweats, weight loss, swollen lymph nodes, body aches, joint swelling, muscle aches, chest pain, shortness of breath, mood changes.   Objective Blood pressure 114/82, pulse 84, height 5\' 1"  (1.549 m), weight 206 lb (93.441 kg), SpO2 98 %.  General: No apparent distress alert and oriented x3 mood and affect normal, dressed appropriately.  HEENT: Pupils equal, extraocular movements intact  Respiratory: Patient's speak in full sentences and does not appear short of breath  Cardiovascular: No lower extremity edema, non tender, no erythema  Skin: Warm dry intact with no signs of infection or rash on extremities or on axial  skeleton.  Abdomen: Soft nontender  Neuro: Cranial nerves II through XII are intact, neurovascularly intact in all extremities with 2+ DTRs and 2+ pulses.  Lymph: No lymphadenopathy of posterior or anterior cervical chain or axillae bilaterally.  Gait mild antalgic gait MSK:  Non tender with full range of motion and good stability and symmetric strength and tone of shoulders, elbows, wrist, hip, and ankles bilaterally.  Knee: Bilateral Mild patella alta. Continued tenderness mostly over the medial joint line and the patellofemoral joint mostly on the left side ROM full in flexion and extension and lower leg rotation. Ligaments with solid consistent endpoints including ACL, PCL, LCL, MCL. Mental pain with [place] Patellar glide mild increase increpitus Patellar and quadriceps tendons unremarkable. Hamstring and quadriceps strength is normal.        Impression and Recommendations:     This case required medical decision making of moderate complexity.

## 2015-05-06 NOTE — Progress Notes (Signed)
Pre visit review using our clinic review tool, if applicable. No additional management support is needed unless otherwise documented below in the visit note. 

## 2015-05-06 NOTE — Patient Instructions (Signed)
Good to see you  I am sorry not much better Lets get xray today Physical therapy will be calling you Keep trucking along See me again in 4-6 weeks and lets see if we have made improvements or may need to try an injection.

## 2015-05-18 ENCOUNTER — Ambulatory Visit (INDEPENDENT_AMBULATORY_CARE_PROVIDER_SITE_OTHER): Payer: Medicare Other

## 2015-05-18 DIAGNOSIS — E538 Deficiency of other specified B group vitamins: Secondary | ICD-10-CM | POA: Diagnosis not present

## 2015-05-18 MED ORDER — CYANOCOBALAMIN 1000 MCG/ML IJ SOLN
1000.0000 ug | Freq: Once | INTRAMUSCULAR | Status: AC
  Administered 2015-05-18: 1000 ug via INTRAMUSCULAR

## 2015-05-26 ENCOUNTER — Other Ambulatory Visit: Payer: Self-pay | Admitting: Family Medicine

## 2015-06-05 ENCOUNTER — Ambulatory Visit: Payer: Medicare Other | Attending: Internal Medicine | Admitting: Physical Therapy

## 2015-06-05 DIAGNOSIS — M25561 Pain in right knee: Secondary | ICD-10-CM | POA: Diagnosis not present

## 2015-06-05 DIAGNOSIS — M25562 Pain in left knee: Secondary | ICD-10-CM | POA: Insufficient documentation

## 2015-06-05 DIAGNOSIS — R2689 Other abnormalities of gait and mobility: Secondary | ICD-10-CM | POA: Diagnosis not present

## 2015-06-05 DIAGNOSIS — R29898 Other symptoms and signs involving the musculoskeletal system: Secondary | ICD-10-CM | POA: Insufficient documentation

## 2015-06-05 NOTE — Patient Instructions (Signed)
Quadriceps Set (Prone)    With toes supporting lower legs, tighten thigh muscles to straighten knees. Hold __5-10__ seconds. Relax. Repeat __10__ times per set. Do __1-2__ sets per session. Do _2___ sessions per day.  http://orth.exer.us/727   Copyright  VHI. All rights reserved.    Abduction: Side Leg Lift (Eccentric) - Side-Lying    Lie on side. Lift top leg slightly higher than shoulder level. Keep top leg straight with body, toes pointing forward. Slowly lower for 3-5 seconds. __10_ reps per set, _1_ sets per day, __5_ days per week.  http://ecce.exer.us/63   Copyright  VHI. All rights reserved.

## 2015-06-05 NOTE — Therapy (Signed)
[place], [place], [address] Phone: [phone]   Fax:  [phone]  Physical Therapy Evaluation  Patient Details  Name: Courtney [name] MRN: [id number] Date of Birth: 1976/04/04 Referring Provider: Dr. Hulan Saas   Encounter Date: 06/05/2015      PT End of Session - 06/05/15 1123    Visit Number 1   Number of Visits 16   Date for PT Re-Evaluation 07/31/15   PT Start Time 1020   PT Stop Time 1105   PT Time Calculation (min) 45 min   Activity Tolerance Patient tolerated treatment well   Behavior During Therapy [facility] for tasks assessed/performed      Past Medical History  Diagnosis Date  . GERD (gastroesophageal reflux disease)   . Fibromyalgia   . Chronic fatigue   . CTS (carpal tunnel syndrome)   . Anemia, B12 deficiency   . Multiple sclerosis ([place])   . Hyperlipidemia   . C. difficile colitis   . Anxiety   . Heart murmur   . VITAMIN D DEFICIENCY 01/13/2010  . OBSTRUCTIVE SLEEP APNEA 12/08/2008  . Irritable bowel syndrome 05/24/2010  . HYPERLIPIDEMIA 03/13/2007  . ESSENTIAL HYPERTENSION 03/13/2007  . CARPAL TUNNEL SYNDROME, BILATERAL 01/13/2010  . ALLERGIC RHINITIS 03/13/2007  . Cervical lesion 07/13/2011    Spine lesion - indeterminate, for f/u MRI may 2013 per neurology  . Diabetes mellitus without complication ([place])   . Allergy   . Sleep apnea     uses c pap  . Hx of adenomatous polyp of colon 06/09/2009    Past Surgical History  Procedure Laterality Date  . Carpal tunnel release  01/2010    bilateral  . Colonoscopy w/ biopsies and polypectomy  05/2009    2 tubular adenomas  . Laparoscopic appendectomy N/A 07/23/2012    Procedure: APPENDECTOMY LAPAROSCOPIC;  Surgeon: Gayland Curry, MD;  Location: [facility];  Service: General;  Laterality: N/A;  . Appendectomy  07/23/12    There were no vitals filed for this visit.  Visit Diagnosis:  Weakness of both legs  Arthralgia of both  knees  Decreased functional mobility      Subjective Assessment - 06/05/15 1022    Subjective Pt reports L chronic knee pain (approx. 3 yrs ago) due to severe arthritis.  She has had PT for this before with min benefit.  Pain continues and significant weakness due to neurological impairments.  She is limited in her ability to walk, negotiate stairs, she cannot squat, painful with activity and even with sleeping. She c/o stiiffness with overactivtiy or if she sits too long. She has bilateral knee braces for stability but does not always use.     Pertinent History MS with cervical lesion, fibromyalgia, diabetes, HTN, see snapshot.  Originally her L LE was stronger than her Rt. side, cervical lesion produced Rt. sided UE and LE weakness. Arthritis limited her L knee and now is much weaker.    Limitations Sitting;Standing;Walking;House hold activities   How long can you sit comfortably? >1 hr   How long can you stand comfortably? always hurts but she is up most of the day.    Diagnostic tests MRI a few yrs ago., [name]    Patient Stated Goals be able to walk and keep moving   Currently in Pain? Yes   Pain Score 5    Pain Location Knee   Pain Orientation Left;Anterior;Medial   Pain Descriptors / Indicators Aching;Tightness;Burning   Pain Type Surgical  pain   Pain Onset More than a month ago   Pain Frequency Constant   Aggravating Factors  overactivity   Pain Relieving Factors propping knee, but nothing really   Effect of Pain on Daily Activities painful for all activities, relies on her mother at times for laundry, heavier household duties, has 2 teenagers    Multiple Pain Sites Yes   Pain Score 7   Pain Location Knee   Pain Orientation Right;Anterior;Lateral   Pain Descriptors / Indicators Tightness;Aching   Pain Type Chronic pain   Pain Onset More than a month ago   Pain Frequency Constant   Aggravating Factors  weightbearing   Pain Relieving Factors nothing , positioning at times     Effect of Pain on Daily Activities see above            [facility] PT Assessment - 06/05/15 1034    Assessment   Medical Diagnosis L knee pain    Referring Provider Dr. Hulan Saas    Onset Date/Surgical Date --  chronic   Next MD Visit 4 weeks    Prior Therapy Yes [facility] hospital    Precautions   Precautions Fall   Precaution Comments due to :LE weakness, avoid overheating due to MS   Required Braces or Orthoses --  bilateral braces usually wears one at a time, upright    Restrictions   Weight Bearing Restrictions No   Balance Screen   Has the patient fallen in the past 6 months No   Has the patient had a decrease in activity level because of a fear of falling?  Yes   Is the patient reluctant to leave their home because of a fear of falling?  No   Home Environment   Living Environment Private residence   Living Arrangements Children   Available Help at Discharge Family   Type of [place] to enter   Entrance Stairs-Number of Steps 6   Entrance Stairs-Rails Right   Home Layout Two level   [place] - single point   Additional Comments laundry in basement, has difficulty descending 13 steps    Prior Function   Level of Independence Independent   Leisure teenage children, being outside    Cognition   Overall Cognitive Status Within Functional Limits for tasks assessed   Observation/Other Assessments   Focus on Therapeutic Outcomes (FOTO)  57% CK  goal 44   Circumferential Edema   Circumferential - Right 18 inch   Circumferential - Left  17 inch    Sensation   Light Touch Appears Intact   Coordination   Gross Motor Movements are Fluid and Coordinated Not tested   Posture/Postural Control   Posture/Postural Control Postural limitations   Posture Comments genu recurvatum in standing    AROM   Right Knee Extension 4   Right Knee Flexion 117   Left Knee Extension 2   Left Knee Flexion 114   Strength   Right Hip Flexion 4-/5   Right  Hip Extension 4+/5   Right Hip ABduction 3+/5   Left Hip Flexion 3-/5   Left Hip Extension 3-/5   Left Hip ABduction 3-/5   Right Knee Flexion 3+/5   Right Knee Extension 3/5   Left Knee Flexion 3/5   Left Knee Extension 3-/5   Right Ankle Dorsiflexion 3+/5   Left Ankle Dorsiflexion 3/5   Palpation   Patella mobility hypomobile   min pain with mobilization   Palpation  comment poor quad contraction    Special Tests   Knee Special tests  Patellofemoral Grind Test (Clarke's Sign)   Patellofemoral Grind test (Clark's Sign)   Findings Postive   Side  Right;Left   Comments pain    Transfers   Transfers Sit to Stand   Comments occ has difficulty standing from low surfaces           PT Education - 06/05/15 1123    Education provided Yes   Education Details HEP, PT/POC   Person(s) Educated Patient   Methods Explanation;Demonstration;Handout   Comprehension Verbalized understanding;Returned demonstration          PT Short Term Goals - 06/05/15 1130    PT SHORT TERM GOAL #1   Title Pt will be I with initial HEP for LE strength    Time 4   Period Weeks   Status New   PT SHORT TERM GOAL #2   Title Pt will be able to perform L knee SAQ without assist x 10 to demo increased strength    Time 4   Period Weeks   Status New   PT SHORT TERM GOAL #3   Title Pt will be able to report 25% less pain and stiffness/pain with sit to stand transfers    PT SHORT TERM GOAL #4   Title Balance/mobility screen to assess fall risk (TUG, DGI)   Time 4   Period Weeks           PT Long Term Goals - 06/05/15 1131    PT LONG TERM GOAL #1   Title Pt will be I with concepts of RICE, body mechanics to prevent reinjury   Time 8   Period Weeks   Status New   PT LONG TERM GOAL #2   Title Pt will score <38% limited on FOTO to demo functional improvement    Time 8   Period Weeks   Status New   PT LONG TERM GOAL #3   Title Pt will be able to perform a modified squat in order to be able to  retrieve items from floor using good body mechanics.    Time 8   Period Weeks   Status New   PT LONG TERM GOAL #4   Title Pt will be able to strengthen knee and hip muscle groups by 1/2 muscle grade each.     Time 8   Period Weeks   Status New   PT LONG TERM GOAL #5   Title Pt will be able to negotiate stairs in home (6 large) 2 times per day with only min difficulty.    Time 8   Period Weeks   Status New               Plan - 06/05/15 1124    Clinical Impression Statement Pt with mod complexity evaluation for chronic knee pain (L>R) and impairment in functional mobility.  Her situation is complicated by neurological weakness, LLE has originally been her stronger leg.  Proximal strength is good.  She has had PT on her L knee before but never was fully able to regain  abiloity to full extend knee in sitting.     Pt will benefit from skilled therapeutic intervention in order to improve on the following deficits Decreased range of motion;Difficulty walking;Increased fascial restricitons;Obesity;Increased muscle spasms;Decreased endurance;Decreased activity tolerance;Pain;Hypomobility;Decreased balance;Decreased mobility;Decreased strength;Increased edema;Postural dysfunction   Rehab Potential Good   PT Frequency 2x / week   PT Duration 8 weeks  PT Treatment/Interventions ADLs/Self Care Home Management;Ultrasound;Neuromuscular re-education;DME Instruction;Patient/family education;Gait training;Stair training;Cryotherapy;Electrical Stimulation;Functional mobility training;Iontophoresis 4mg /ml Dexamethasone;Therapeutic activities;Therapeutic exercise;Moist Heat;Balance training;Manual techniques;Vasopneumatic Device;Taping   PT Next Visit Plan Assess tape effect, pt given HEP from Dr. Tamala Julian, asked her to bring in to assess, able to get better quad set in prone, focus on strength and patellar mobs, stretch quads    PT Home Exercise Plan gave hip abd and quad set prone    Consulted and Agree  with Plan of Care Patient          G-Codes - 06-23-2015 1138    Functional Assessment Tool Used FOTO   Functional Limitation Mobility: Walking and moving around   Mobility: Walking and Moving Around Current Status [phone]) At least 40 percent but less than 60 percent impaired, limited or restricted   Mobility: Walking and Moving Around Goal Status [phone]) At least 20 percent but less than 40 percent impaired, limited or restricted       Problem List Patient Active Problem List   Diagnosis Date Noted  . Patella alta 05/06/2015  . Patellofemoral arthritis 02/25/2015  . Bilateral knee pain 02/13/2015  . Nausea without vomiting 08/11/2014  . DM type 2 (diabetes mellitus, type 2) ([place]) 08/11/2014  . Vitamin B 12 deficiency 04/24/2014  . Acute appendicitis without mention of peritonitis 07/23/2012  . Fatigue 01/09/2012  . Cervical lesion 07/13/2011  . Chest pain 12/23/2010  . Arm swelling 12/23/2010  . Preventative health care 10/20/2010  . Contraceptive management 10/17/2010  . Irritable bowel syndrome 05/24/2010  . VITAMIN D DEFICIENCY 01/13/2010  . CARPAL TUNNEL SYNDROME, BILATERAL 01/13/2010  . Hx of adenomatous polyp of colon 06/09/2009  . Obstructive sleep apnea 12/08/2008  . HYPERSOMNIA UNSPECIFIED 10/30/2008  . CHRONIC DIARRHEA SUSPECT IBS 10/30/2008  . ANXIETY 09/25/2007  . RASH-NONVESICULAR 08/22/2007  . LIPOMA 04/30/2007  . Hyperlipidemia 03/13/2007  . Multiple sclerosis ([place]) 03/13/2007  . Essential hypertension 03/13/2007  . ALLERGIC RHINITIS 03/13/2007  . CLOSTRIDIUM DIFFICILE COLITIS, HX OF 03/13/2007  . GERD 09/18/2006    PAA,JENNIFER 06/23/2015, 11:41 AM  [facility] [address] [place], [place], [address] Phone: [phone]   Fax:  [phone]  Name: JEENA MANU MRN: [id number] Date of Birth: 17-Jul-1975   Raeford Razor, PT 2015-06-23 11:41 AM Phone: [phone] Fax: [phone]

## 2015-06-10 ENCOUNTER — Ambulatory Visit: Payer: Medicare Other | Admitting: Physical Therapy

## 2015-06-10 DIAGNOSIS — M25562 Pain in left knee: Secondary | ICD-10-CM

## 2015-06-10 DIAGNOSIS — R29898 Other symptoms and signs involving the musculoskeletal system: Secondary | ICD-10-CM | POA: Diagnosis not present

## 2015-06-10 DIAGNOSIS — M25561 Pain in right knee: Secondary | ICD-10-CM

## 2015-06-10 DIAGNOSIS — R2689 Other abnormalities of gait and mobility: Secondary | ICD-10-CM

## 2015-06-10 NOTE — Therapy (Signed)
[place], [place], [address] Phone: [phone]   Fax:  [phone]  Physical Therapy Treatment  Patient Details  Name: Courtney [name] MRN: [id number] Date of Birth: [birth year] Referring Provider: Dr. Hulan Saas   Encounter Date: 06/10/2015      PT End of Session - 06/10/15 1219    Visit Number 2   Number of Visits 16   PT Start Time 0936   PT Stop Time 1017   PT Time Calculation (min) 41 min   Activity Tolerance Patient tolerated treatment well   Behavior During Therapy [facility] for tasks assessed/performed      Past Medical History  Diagnosis Date  . GERD (gastroesophageal reflux disease)   . Fibromyalgia   . Chronic fatigue   . CTS (carpal tunnel syndrome)   . Anemia, B12 deficiency   . Multiple sclerosis ([place])   . Hyperlipidemia   . C. difficile colitis   . Anxiety   . Heart murmur   . VITAMIN D DEFICIENCY 01/13/2010  . OBSTRUCTIVE SLEEP APNEA 12/08/2008  . Irritable bowel syndrome 05/24/2010  . HYPERLIPIDEMIA 03/13/2007  . ESSENTIAL HYPERTENSION 03/13/2007  . CARPAL TUNNEL SYNDROME, BILATERAL 01/13/2010  . ALLERGIC RHINITIS 03/13/2007  . Cervical lesion 07/13/2011    Spine lesion - indeterminate, for f/u MRI may 2013 per neurology  . Diabetes mellitus without complication ([place])   . Allergy   . Sleep apnea     uses c pap  . Hx of adenomatous polyp of colon 06/09/2009    Past Surgical History  Procedure Laterality Date  . Carpal tunnel release  01/2010    bilateral  . Colonoscopy w/ biopsies and polypectomy  05/2009    2 tubular adenomas  . Laparoscopic appendectomy N/A 07/23/2012    Procedure: APPENDECTOMY LAPAROSCOPIC;  Surgeon: Gayland Curry, MD;  Location: [place];  Service: General;  Laterality: N/A;  . Appendectomy  07/23/12    There were no vitals filed for this visit.  Visit Diagnosis:  Weakness of both legs  Arthralgia of both knees  Decreased functional mobility       Subjective Assessment - 06/10/15 0939    Subjective Rt knee Painful medial  7/10  .   Different from evaluation soreness.  tape helped some LT knee .Marland Kitchen Stayed on for a couple days   Currently in Pain? Yes   Pain Score 7    Pain Location Knee   Pain Orientation Right;Left   Pain Descriptors / Indicators Aching   Pain Frequency Constant   Aggravating Factors  walking,    Pain Relieving Factors nothing                         [facility] Adult PT Treatment/Exercise - 06/10/15 0001    Self-Care   Self-Care --  How to decrease swelling.    Knee/Hip Exercises: Supine   Quad Sets 10 reps;1 set;Both   Quad Sets Limitations poor contractions , both   Heel Slides 5 reps  AA   Knee/Hip Exercises: Prone   Other Prone Exercises terminal knee extension.   Manual Therapy   Manual Therapy --  patellar mobilization,  increased post manual.   Manual therapy comments retrograde, myofascial release, patellar mobilization Both, instrument assist intermittantly  tissues tight and congested.  they were softened tho not com                PT Education - 06/10/15 1219  Education provided Yes   Education Details terminal knee extension prone,  Retrograde how to do   Person(s) Educated Patient   Methods Explanation;Demonstration;Tactile cues;Verbal cues;Handout   Comprehension Verbalized understanding;Returned demonstration          PT Short Term Goals - 06/10/15 1224    PT SHORT TERM GOAL #1   Title Pt will be I with initial HEP for LE strength    Time 4   Period Weeks   Status On-going   PT SHORT TERM GOAL #2   Title Pt will be able to perform L knee SAQ without assist x 10 to demo increased strength    Time 4   Period Weeks   Status On-going   PT SHORT TERM GOAL #3   Title Pt will be able to report 25% less pain and stiffness/pain with sit to stand transfers    Status On-going   PT SHORT TERM GOAL #4   Title Balance/mobility screen to assess fall risk (TUG, DGI)    Time 4   Period Weeks   Status On-going           PT Long Term Goals - 06/05/15 1131    PT LONG TERM GOAL #1   Title Pt will be I with concepts of RICE, body mechanics to prevent reinjury   Time 8   Period Weeks   Status New   PT LONG TERM GOAL #2   Title Pt will score <38% limited on FOTO to demo functional improvement    Time 8   Period Weeks   Status New   PT LONG TERM GOAL #3   Title Pt will be able to perform a modified squat in order to be able to retrieve items from floor using good body mechanics.    Time 8   Period Weeks   Status New   PT LONG TERM GOAL #4   Title Pt will be able to strengthen knee and hip muscle groups by 1/2 muscle grade each.     Time 8   Period Weeks   Status New   PT LONG TERM GOAL #5   Title Pt will be able to negotiate stairs in home (6 large) 2 times per day with only min difficulty.    Time 8   Period Weeks   Status New               Plan - 06/10/15 1220    Clinical Impression Statement Legs swollwn and congested.  Manual helpfu, so hopefully she will continue to work on this at home.  Quads show poor contractions supine tho work better in prone.  She makes little time for herself at home so I encouraged her to make toime for exercises and  manual. for swelling.  She had some increased pain post session from manual.    PT Next Visit Plan Re tape,  Review exercises from DR. Smith stretching quads, gastroc .  TUG?   PT Home Exercise Plan Continue   Consulted and Agree with Plan of Care Patient        Problem List Patient Active Problem List   Diagnosis Date Noted  . Patella alta 05/06/2015  . Patellofemoral arthritis 02/25/2015  . Bilateral knee pain 02/13/2015  . Nausea without vomiting 08/11/2014  . DM type 2 (diabetes mellitus, type 2) ([place]) 08/11/2014  . Vitamin B 12 deficiency 04/24/2014  . Acute appendicitis without mention of peritonitis 07/23/2012  . Fatigue 01/09/2012  . Cervical lesion 07/13/2011  .  Chest  pain 12/23/2010  . Arm swelling 12/23/2010  . Preventative health care 10/20/2010  . Contraceptive management 10/17/2010  . Irritable bowel syndrome 05/24/2010  . VITAMIN D DEFICIENCY 01/13/2010  . CARPAL TUNNEL SYNDROME, BILATERAL 01/13/2010  . Hx of adenomatous polyp of colon 06/09/2009  . Obstructive sleep apnea 12/08/2008  . HYPERSOMNIA UNSPECIFIED 10/30/2008  . CHRONIC DIARRHEA SUSPECT IBS 10/30/2008  . ANXIETY 09/25/2007  . RASH-NONVESICULAR 08/22/2007  . LIPOMA 04/30/2007  . Hyperlipidemia 03/13/2007  . Multiple sclerosis ([place]) 03/13/2007  . Essential hypertension 03/13/2007  . ALLERGIC RHINITIS 03/13/2007  . CLOSTRIDIUM DIFFICILE COLITIS, HX OF 03/13/2007  . GERD 09/18/2006    HARRIS,KAREN 06/10/2015, 12:26 PM  [facility] [address] [place], [place], [address] Phone: [phone]   Fax:  [phone]  Name: GLORIETTA PERILLI MRN: [id number] Date of Birth: [birth year]    Melvenia Needles, PTA 06/10/2015 12:26 PM Phone: [phone] Fax: [phone]

## 2015-06-10 NOTE — Patient Instructions (Signed)
Hand drawn -terminal knee extension.

## 2015-06-16 ENCOUNTER — Ambulatory Visit: Payer: Medicare Other | Admitting: Physical Therapy

## 2015-06-16 ENCOUNTER — Ambulatory Visit (INDEPENDENT_AMBULATORY_CARE_PROVIDER_SITE_OTHER): Payer: Medicare Other | Admitting: General Practice

## 2015-06-16 DIAGNOSIS — M25562 Pain in left knee: Secondary | ICD-10-CM | POA: Diagnosis not present

## 2015-06-16 DIAGNOSIS — R2689 Other abnormalities of gait and mobility: Secondary | ICD-10-CM | POA: Diagnosis not present

## 2015-06-16 DIAGNOSIS — E539 Vitamin B deficiency, unspecified: Secondary | ICD-10-CM | POA: Diagnosis not present

## 2015-06-16 DIAGNOSIS — R29898 Other symptoms and signs involving the musculoskeletal system: Secondary | ICD-10-CM | POA: Diagnosis not present

## 2015-06-16 DIAGNOSIS — M25561 Pain in right knee: Secondary | ICD-10-CM

## 2015-06-16 MED ORDER — CYANOCOBALAMIN 1000 MCG/ML IJ SOLN
1000.0000 ug | Freq: Once | INTRAMUSCULAR | Status: AC
  Administered 2015-06-16: 1000 ug via INTRAMUSCULAR

## 2015-06-16 NOTE — Therapy (Signed)
[name], [place], [address] Phone: [phone]   Fax:  [phone]  Physical Therapy Treatment  Patient Details  Name: Courtney [name] MRN: [id number] Date of Birth: 05-27-1975 Referring Provider: Dr. Hulan Saas   Encounter Date: 06/16/2015      PT End of Session - 06/16/15 1034    Visit Number 3   Number of Visits 16   Date for PT Re-Evaluation 07/31/15   PT Start Time 0851   PT Stop Time 0938   PT Time Calculation (min) 47 min   Activity Tolerance Patient tolerated treatment well   Behavior During Therapy [facility] for tasks assessed/performed      Past Medical History  Diagnosis Date  . GERD (gastroesophageal reflux disease)   . Fibromyalgia   . Chronic fatigue   . CTS (carpal tunnel syndrome)   . Anemia, B12 deficiency   . Multiple sclerosis (Courtney [name])   . Hyperlipidemia   . C. difficile colitis   . Anxiety   . Heart murmur   . VITAMIN D DEFICIENCY 01/13/2010  . OBSTRUCTIVE SLEEP APNEA 12/08/2008  . Irritable bowel syndrome 05/24/2010  . HYPERLIPIDEMIA 03/13/2007  . ESSENTIAL HYPERTENSION 03/13/2007  . CARPAL TUNNEL SYNDROME, BILATERAL 01/13/2010  . ALLERGIC RHINITIS 03/13/2007  . Cervical lesion 07/13/2011    Spine lesion - indeterminate, for f/u MRI may 2013 per neurology  . Diabetes mellitus without complication ([place] [name])   . Allergy   . Sleep apnea     uses c pap  . Hx of adenomatous polyp of colon 06/09/2009    Past Surgical History  Procedure Laterality Date  . Carpal tunnel release  01/2010    bilateral  . Colonoscopy w/ biopsies and polypectomy  05/2009    2 tubular adenomas  . Laparoscopic appendectomy N/A 07/23/2012    Procedure: APPENDECTOMY LAPAROSCOPIC;  Surgeon: Courtney Curry, MD;  Location: [place];  Service: General;  Laterality: N/A;  . Appendectomy  07/23/12    There were no vitals filed for this visit.  Visit Diagnosis:  Weakness of both legs  Arthralgia of both knees       Subjective Assessment - 06/16/15 0856    Subjective Wears braces every other day    Currently in Pain? Yes   Pain Score 7    Pain Location Knee   Pain Orientation Right;Left   Pain Descriptors / Indicators Aching   Pain Frequency Constant   Aggravating Factors  walking,   Pain Relieving Factors braces, tape a little            [facility] PT Assessment - 06/16/15 0904    Circumferential Edema   Circumferential - Right 17 3/4   Circumferential - Left  17 9/18                     [facility] Adult PT Treatment/Exercise - 06/16/15 0001    Knee/Hip Exercises: Stretches   Passive Hamstring Stretch 3 reps;30 seconds  strap, both   Quad Stretch 3 reps;30 seconds;Both  standing   Gastroc Stretch 3 reps;30 seconds;Both  strap supine   Manual Therapy   Manual therapy comments retrograde soft tissue work RT,  2 fans taping medial knee, thigh and 1 X 505% stretch middle 1/3 medial knee LT                PT Education - 06/16/15 1033    Education provided Yes   Education Details stretching   Person(s) Educated  Patient   Methods Explanation;Demonstration;Verbal cues;Handout   Comprehension Verbalized understanding;Returned demonstration          PT Short Term Goals - 06/16/15 1037    PT SHORT TERM GOAL #1   Title Pt will be I with initial HEP for LE strength    Baseline cues   Time 4   Period Weeks   Status On-going   PT SHORT TERM GOAL #2   Title Pt will be able to perform L knee SAQ without assist x 10 to demo increased strength    Time 4   Period Weeks   Status Unable to assess   PT SHORT TERM GOAL #3   Title Pt will be able to report 25% less pain and stiffness/pain with sit to stand transfers    Period Weeks   Status On-going   PT SHORT TERM GOAL #4   Title Balance/mobility screen to assess fall risk (TUG, DGI)   Time 4   Period Weeks   Status Unable to assess           PT Long Term Goals - 06/05/15 1131    PT LONG TERM GOAL #1   Title Pt  will be I with concepts of RICE, body mechanics to prevent reinjury   Time 8   Period Weeks   Status New   PT LONG TERM GOAL #2   Title Pt will score <38% limited on FOTO to demo functional improvement    Time 8   Period Weeks   Status New   PT LONG TERM GOAL #3   Title Pt will be able to perform a modified squat in order to be able to retrieve items from floor using good body mechanics.    Time 8   Period Weeks   Status New   PT LONG TERM GOAL #4   Title Pt will be able to strengthen knee and hip muscle groups by 1/2 muscle grade each.     Time 8   Period Weeks   Status New   PT LONG TERM GOAL #5   Title Pt will be able to negotiate stairs in home (6 large) 2 times per day with only min difficulty.    Time 8   Period Weeks   Status New               Plan - 06/16/15 1034    Clinical Impression Statement Progress toward home exercise goals.  Pain still constant.  Edema improving RT knee. LT increased.    PT Next Visit Plan TUG  review stretches.  Beginning strengthening.  IFC? for pain.  Assess taping   PT Home Exercise Plan stretching hamstrings, calf- modified due to standing calf hurts patella, quads/ anterior hip   Consulted and Agree with Plan of Care Patient        Problem List Patient Active Problem List   Diagnosis Date Noted  . Patella alta 05/06/2015  . Patellofemoral arthritis 02/25/2015  . Bilateral knee pain 02/13/2015  . Nausea without vomiting 08/11/2014  . DM type 2 (diabetes mellitus, type 2) ([place]) 08/11/2014  . Vitamin B 12 deficiency 04/24/2014  . Acute appendicitis without mention of peritonitis 07/23/2012  . Fatigue 01/09/2012  . Cervical lesion 07/13/2011  . Chest pain 12/23/2010  . Arm swelling 12/23/2010  . Preventative health care 10/20/2010  . Contraceptive management 10/17/2010  . Irritable bowel syndrome 05/24/2010  . VITAMIN D DEFICIENCY 01/13/2010  . CARPAL TUNNEL SYNDROME, BILATERAL 01/13/2010  . Hx  of adenomatous polyp of  colon 06/09/2009  . Obstructive sleep apnea 12/08/2008  . HYPERSOMNIA UNSPECIFIED 10/30/2008  . CHRONIC DIARRHEA SUSPECT IBS 10/30/2008  . ANXIETY 09/25/2007  . RASH-NONVESICULAR 08/22/2007  . LIPOMA 04/30/2007  . Hyperlipidemia 03/13/2007  . Multiple sclerosis (Courtney [name]) 03/13/2007  . Essential hypertension 03/13/2007  . ALLERGIC RHINITIS 03/13/2007  . CLOSTRIDIUM DIFFICILE COLITIS, HX OF 03/13/2007  . GERD 09/18/2006    [facility] 06/16/2015, 10:38 AM  [facility] [address] [place], [place], [address] Phone: [phone]   Fax:  [phone]  Name: VONI RUTTAN MRN: [id number] Date of Birth: 01-19-1976    Melvenia Needles, PTA 06/16/2015 10:38 AM Phone: [phone] Fax: [phone]

## 2015-06-16 NOTE — Patient Instructions (Addendum)
Calf Stretch    With towel around forefoot, keep knee straight and pull back on towel until a stretch is felt in the calf. Hold 30____ seconds. Repeat __3__ times. Do 1____ sessions per day.  Copyright  VHI. All rights reserved.  Stretching: Hip Flexor (Modified)   And Quad stretch.  May kneel back leg on chair seat.    With right leg supported, chair in front for balance, slowly bend other leg until stretch is felt in thigh of supported leg. Hold __30__ seconds. Repeat __3_ times per set. Do __1__ sets per session. Do __1__ sessions per day.  http://orth.exer.us/703 May choose one of these for quads/hip stretch.  Copyright  VHI. All rights reserved.  Hip Flexor Stretch    Lying on back near edge of bed, bend one leg, foot flat. Hang other leg over edge, relaxed, thigh resting entirely on bed for 30____ seconds. Repeat __3_ times. Do ___1_ sessions per day. Advanced Exercise: Bend knee back keeping thigh in contact with bed.  http://gt2.exer.us/347   Copyright  VHI. All rights reserved.

## 2015-06-18 ENCOUNTER — Ambulatory Visit: Payer: Medicare Other | Attending: Internal Medicine | Admitting: Physical Therapy

## 2015-06-18 DIAGNOSIS — R29898 Other symptoms and signs involving the musculoskeletal system: Secondary | ICD-10-CM | POA: Insufficient documentation

## 2015-06-18 DIAGNOSIS — R2689 Other abnormalities of gait and mobility: Secondary | ICD-10-CM | POA: Diagnosis not present

## 2015-06-18 DIAGNOSIS — M25561 Pain in right knee: Secondary | ICD-10-CM

## 2015-06-18 DIAGNOSIS — M25562 Pain in left knee: Secondary | ICD-10-CM | POA: Diagnosis not present

## 2015-06-18 NOTE — Therapy (Signed)
[place], [place], [address] Phone: [phone]   Fax:  [phone]  Physical Therapy Treatment  Patient Details  Name: Courtney [name] MRN: [id number] Date of Birth: Oct 13, 1975 Referring Provider: Dr. Hulan Saas   Encounter Date: 06/18/2015      PT End of Session - 06/18/15 1117    Visit Number 4   Number of Visits 16   Date for PT Re-Evaluation 07/31/15   PT Start Time 1100   PT Stop Time 1155   PT Time Calculation (min) 55 min   Activity Tolerance Patient tolerated treatment well   Behavior During Therapy [facility] for tasks assessed/performed      Past Medical History  Diagnosis Date  . GERD (gastroesophageal reflux disease)   . Fibromyalgia   . Chronic fatigue   . CTS (carpal tunnel syndrome)   . Anemia, B12 deficiency   . Multiple sclerosis ([place])   . Hyperlipidemia   . C. difficile colitis   . Anxiety   . Heart murmur   . VITAMIN D DEFICIENCY 01/13/2010  . OBSTRUCTIVE SLEEP APNEA 12/08/2008  . Irritable bowel syndrome 05/24/2010  . HYPERLIPIDEMIA 03/13/2007  . ESSENTIAL HYPERTENSION 03/13/2007  . CARPAL TUNNEL SYNDROME, BILATERAL 01/13/2010  . ALLERGIC RHINITIS 03/13/2007  . Cervical lesion 07/13/2011    Spine lesion - indeterminate, for f/u MRI may 2013 per neurology  . Diabetes mellitus without complication ([place])   . Allergy   . Sleep apnea     uses c pap  . Hx of adenomatous polyp of colon 06/09/2009    Past Surgical History  Procedure Laterality Date  . Carpal tunnel release  01/2010    bilateral  . Colonoscopy w/ biopsies and polypectomy  05/2009    2 tubular adenomas  . Laparoscopic appendectomy N/A 07/23/2012    Procedure: APPENDECTOMY LAPAROSCOPIC;  Surgeon: Gayland Curry, MD;  Location: [place];  Service: General;  Laterality: N/A;  . Appendectomy  07/23/12    There were no vitals filed for this visit.  Visit Diagnosis:  Weakness of both legs  Arthralgia of both  knees  Decreased functional mobility      Subjective Assessment - 06/18/15 1107    Subjective Rt. really swollen and was twice the size yesterday.  Tape did not help.    Currently in Pain? Yes   Pain Score 7    Pain Location Knee   Pain Orientation Right   Pain Descriptors / Indicators Aching   Pain Type Chronic pain   Pain Onset More than a month ago   Pain Frequency Constant                         [facility] Adult PT Treatment/Exercise - 06/18/15 1120    Knee/Hip Exercises: Stretches   Active Hamstring Stretch 2 reps;30 seconds   Other Knee/Hip Stretches knee flexion with sheet x 5, 15 sec    Knee/Hip Exercises: Aerobic   Nustep 6 min level 6 Ue and LE    Knee/Hip Exercises: Supine   Quad Sets Strengthening;Both;1 set;20 reps   Short Arc Quad Sets Strengthening;Both;1 set;20 reps   Bridges with [employer] Strengthening;Both;1 set;10 reps   Other Supine Knee/Hip Exercises clam red band x 20    Knee/Hip Exercises: Sidelying   Hip ABduction Strengthening;Both;1 set;15 reps   Modalities   Modalities --  18.25, 17.25 inches (R, L)   Vasopneumatic   Number Minutes Vasopneumatic  15 minutes  Vasopnuematic Location  Knee  R   Vasopneumatic Pressure High   Vasopneumatic Temperature  32 deg C   Manual Therapy   Manual therapy comments retrograde massage to Rt. knee with elevation                 PT Education - 06/18/15 1144    Education provided Yes   Education Details HEP reinforcement   Person(s) Educated Patient   Methods Explanation;Demonstration;Verbal cues   Comprehension Verbalized understanding;Returned demonstration          PT Short Term Goals - 06/16/15 1037    PT SHORT TERM GOAL #1   Title Pt will be I with initial HEP for LE strength    Baseline cues   Time 4   Period Weeks   Status On-going   PT SHORT TERM GOAL #2   Title Pt will be able to perform L knee SAQ without assist x 10 to demo increased strength    Time 4    Period Weeks   Status Unable to assess   PT SHORT TERM GOAL #3   Title Pt will be able to report 25% less pain and stiffness/pain with sit to stand transfers    Period Weeks   Status On-going   PT SHORT TERM GOAL #4   Title Balance/mobility screen to assess fall risk (TUG, DGI)   Time 4   Period Weeks   Status Unable to assess           PT Long Term Goals - 06/05/15 1131    PT LONG TERM GOAL #1   Title Pt will be I with concepts of RICE, body mechanics to prevent reinjury   Time 8   Period Weeks   Status New   PT LONG TERM GOAL #2   Title Pt will score <38% limited on FOTO to demo functional improvement    Time 8   Period Weeks   Status New   PT LONG TERM GOAL #3   Title Pt will be able to perform a modified squat in order to be able to retrieve items from floor using good body mechanics.    Time 8   Period Weeks   Status New   PT LONG TERM GOAL #4   Title Pt will be able to strengthen knee and hip muscle groups by 1/2 muscle grade each.     Time 8   Period Weeks   Status New   PT LONG TERM GOAL #5   Title Pt will be able to negotiate stairs in home (6 large) 2 times per day with only min difficulty.    Time 8   Period Weeks   Status New               Plan - 06/18/15 1145    Clinical Impression Statement Patient with edema limiting function and pain continues.  May call MD for appt.  No goals met. Edema improved 1/4 inch post vaso/massage.    PT Next Visit Plan TUG, cont mat level strengthening, consider IFC along with vaso (if she liked vaso)   PT Home Exercise Plan stretching hamstrings, calf- modified due to standing calf hurts patella, quads/ anterior hip, hip abd and quad sets   Consulted and Agree with Plan of Care Patient        Problem List Patient Active Problem List   Diagnosis Date Noted  . Patella alta 05/06/2015  . Patellofemoral arthritis 02/25/2015  . Bilateral knee pain 02/13/2015  .  Nausea without vomiting 08/11/2014  . DM type 2  (diabetes mellitus, type 2) ([place]) 08/11/2014  . Vitamin B 12 deficiency 04/24/2014  . Acute appendicitis without mention of peritonitis 07/23/2012  . Fatigue 01/09/2012  . Cervical lesion 07/13/2011  . Chest pain 12/23/2010  . Arm swelling 12/23/2010  . Preventative health care 10/20/2010  . Contraceptive management 10/17/2010  . Irritable bowel syndrome 05/24/2010  . VITAMIN D DEFICIENCY 01/13/2010  . CARPAL TUNNEL SYNDROME, BILATERAL 01/13/2010  . Hx of adenomatous polyp of colon 06/09/2009  . Obstructive sleep apnea 12/08/2008  . HYPERSOMNIA UNSPECIFIED 10/30/2008  . CHRONIC DIARRHEA SUSPECT IBS 10/30/2008  . ANXIETY 09/25/2007  . RASH-NONVESICULAR 08/22/2007  . LIPOMA 04/30/2007  . Hyperlipidemia 03/13/2007  . Multiple sclerosis () 03/13/2007  . Essential hypertension 03/13/2007  . ALLERGIC RHINITIS 03/13/2007  . CLOSTRIDIUM DIFFICILE COLITIS, HX OF 03/13/2007  . GERD 09/18/2006    Keneshia Tena 06/18/2015, 12:08 PM  [facility] [address] [place], [place], [address] Phone: [phone]   Fax:  [phone]  Name: Courtney [name] MRN: [id number] Date of Birth: 06-01-1975   Raeford Razor, PT 06/18/2015 12:08 PM Phone: [phone] Fax: [phone]

## 2015-06-21 ENCOUNTER — Other Ambulatory Visit: Payer: Self-pay | Admitting: Internal Medicine

## 2015-06-23 ENCOUNTER — Ambulatory Visit: Payer: Medicare Other | Admitting: Physical Therapy

## 2015-06-23 DIAGNOSIS — M25561 Pain in right knee: Secondary | ICD-10-CM | POA: Diagnosis not present

## 2015-06-23 DIAGNOSIS — M25562 Pain in left knee: Secondary | ICD-10-CM | POA: Diagnosis not present

## 2015-06-23 DIAGNOSIS — R2689 Other abnormalities of gait and mobility: Secondary | ICD-10-CM | POA: Diagnosis not present

## 2015-06-23 DIAGNOSIS — R29898 Other symptoms and signs involving the musculoskeletal system: Secondary | ICD-10-CM

## 2015-06-23 NOTE — Therapy (Signed)
[place], [place], [address] Phone: [phone]   Fax:  [phone]  Physical Therapy Treatment  Patient Details  Name: Courtney [name] MRN: [id number] Date of Birth: Jul 05, 1975 Referring Provider: Dr. Hulan Saas   Encounter Date: 06/23/2015      PT End of Session - 06/23/15 1109    Visit Number 5   Number of Visits 16   Date for PT Re-Evaluation 07/31/15   PT Start Time 1102   PT Stop Time 1202   PT Time Calculation (min) 60 min   Behavior During Therapy [facility] for tasks assessed/performed      Past Medical History  Diagnosis Date  . GERD (gastroesophageal reflux disease)   . Fibromyalgia   . Chronic fatigue   . CTS (carpal tunnel syndrome)   . Anemia, B12 deficiency   . Multiple sclerosis ([place])   . Hyperlipidemia   . C. difficile colitis   . Anxiety   . Heart murmur   . VITAMIN D DEFICIENCY 01/13/2010  . OBSTRUCTIVE SLEEP APNEA 12/08/2008  . Irritable bowel syndrome 05/24/2010  . HYPERLIPIDEMIA 03/13/2007  . ESSENTIAL HYPERTENSION 03/13/2007  . CARPAL TUNNEL SYNDROME, BILATERAL 01/13/2010  . ALLERGIC RHINITIS 03/13/2007  . Cervical lesion 07/13/2011    Spine lesion - indeterminate, for f/u MRI may 2013 per neurology  . Diabetes mellitus without complication ([place])   . Allergy   . Sleep apnea     uses c pap  . Hx of adenomatous polyp of colon 06/09/2009    Past Surgical History  Procedure Laterality Date  . Carpal tunnel release  01/2010    bilateral  . Colonoscopy w/ biopsies and polypectomy  05/2009    2 tubular adenomas  . Laparoscopic appendectomy N/A 07/23/2012    Procedure: APPENDECTOMY LAPAROSCOPIC;  Surgeon: Gayland Curry, MD;  Location: [facility];  Service: General;  Laterality: N/A;  . Appendectomy  07/23/12    There were no vitals filed for this visit.  Visit Diagnosis:  Weakness of both legs  Arthralgia of both knees  Decreased functional mobility      Subjective Assessment  - 06/23/15 1107    Subjective Having a good day, min knee pain overall   Currently in Pain? Yes   Pain Score 2    Pain Location Knee   Pain Orientation Right;Left   Pain Descriptors / Indicators Aching   Pain Type Chronic pain   Pain Onset More than a month ago   Pain Frequency Constant   Aggravating Factors  walking    Pain Relieving Factors braces (does not wear to PT)             [facility] PT Assessment - 06/23/15 1140    AROM   Right Knee Flexion 120   Left Knee Flexion 115                     [facility] Adult PT Treatment/Exercise - 06/23/15 1111    Knee/Hip Exercises: Stretches   Active Hamstring Stretch 2 reps;30 seconds   Other Knee/Hip Stretches knee flexion with sheet x 5, 15 sec    Other Knee/Hip Stretches ITB 30 sec x 2    Knee/Hip Exercises: Aerobic   Nustep 6 min level 6 Ue and LE    Knee/Hip Exercises: Machines for Strengthening   Other Machine Omega bilat  x 20 25 lbs, unilat. 20 each x 10    Knee/Hip Exercises: Standing   Lateral Step Up  Both;1 set;20 reps;Hand Hold: 1   Forward Step Up Both;1 set;20 reps;Hand Hold: 1   SLS with Vectors hip abd and et x 10 off step    Knee/Hip Exercises: Seated   Long Arc Quad Strengthening;Both;2 sets;10 reps   Long Arc Quad Limitations ball squeeze   Sit to [employer] 1 set;10 reps;without UE support   Knee/Hip Exercises: Supine   Quad Sets Strengthening;Both;1 set;20 reps   Short Arc [employer] Strengthening;Both;1 set;20 reps   Short Arc [employer] Limitations 2 lbs    Heel Slides Strengthening;Both;1 set;15 reps   Heel Slides Limitations foam roller supine   Bridges with [employer] Strengthening;Both;1 set;10 reps   Other Supine Knee/Hip Exercises clam green band x 20    Vasopneumatic   Number Minutes Vasopneumatic  15 minutes   Vasopnuematic Location  Knee  R   Vasopneumatic Pressure High   Vasopneumatic Temperature  32 deg C                  PT Short Term Goals - 06/23/15 1126    PT SHORT TERM  GOAL #1   Title Pt will be I with initial HEP for LE strength    Status Achieved   PT SHORT TERM GOAL #2   Title Pt will be able to perform L knee SAQ without assist x 10 to demo increased strength    PT SHORT TERM GOAL #3   Title Pt will be able to report 25% less pain and stiffness/pain with sit to stand transfers    Baseline no pain today, but has had recently   Status Partially Met           PT Long Term Goals - 06/23/15 1128    PT LONG TERM GOAL #1   Title Pt will be I with concepts of RICE, body mechanics to prevent reinjury   Status Achieved   PT LONG TERM GOAL #2   Title Pt will score <38% limited on FOTO to demo functional improvement    Status Unable to assess   PT LONG TERM GOAL #3   Title Pt will be able to perform a modified squat in order to be able to retrieve items from floor using good body mechanics.    Status On-going   PT LONG TERM GOAL #4   Title Pt will be able to strengthen knee and hip muscle groups by 1/2 muscle grade each.     Status On-going   PT LONG TERM GOAL #5   Title Pt will be able to negotiate stairs in home (6 large) 2 times per day with only min difficulty.    Baseline improving   Status Partially Met               Plan - 06/23/15 1146    Clinical Impression Statement Pt did not need to see MD for swelling, improved today and was able to exercise without limitation of pain today.  See goals met.  Stairs improving and pt can usually stand without UE assist and no increase in pain with this.     PT Next Visit Plan TUG or BERG , cont to progress quad and Hamstring strength    PT Home Exercise Plan stretching hamstrings, calf- modified due to standing calf hurts patella, quads/ anterior hip, hip abd and quad sets   Consulted and Agree with Plan of Care Patient        Problem List Patient Active Problem List   Diagnosis Date  Noted  . Patella alta 05/06/2015  . Patellofemoral arthritis 02/25/2015  . Bilateral knee pain 02/13/2015   . Nausea without vomiting 08/11/2014  . DM type 2 (diabetes mellitus, type 2) ([place]) 08/11/2014  . Vitamin B 12 deficiency 04/24/2014  . Acute appendicitis without mention of peritonitis 07/23/2012  . Fatigue 01/09/2012  . Cervical lesion 07/13/2011  . Chest pain 12/23/2010  . Arm swelling 12/23/2010  . Preventative health care 10/20/2010  . Contraceptive management 10/17/2010  . Irritable bowel syndrome 05/24/2010  . VITAMIN D DEFICIENCY 01/13/2010  . CARPAL TUNNEL SYNDROME, BILATERAL 01/13/2010  . Hx of adenomatous polyp of colon 06/09/2009  . Obstructive sleep apnea 12/08/2008  . HYPERSOMNIA UNSPECIFIED 10/30/2008  . CHRONIC DIARRHEA SUSPECT IBS 10/30/2008  . ANXIETY 09/25/2007  . RASH-NONVESICULAR 08/22/2007  . LIPOMA 04/30/2007  . Hyperlipidemia 03/13/2007  . Multiple sclerosis ([place]) 03/13/2007  . Essential hypertension 03/13/2007  . ALLERGIC RHINITIS 03/13/2007  . CLOSTRIDIUM DIFFICILE COLITIS, HX OF 03/13/2007  . GERD 09/18/2006    Dartanian Knaggs 06/23/2015, 11:48 AM  [facility] [address] [place], [place], [address] Phone: [phone]   Fax:  [phone]  Name: Courtney [name] MRN: [id number] Date of Birth: 01-08-1976    Raeford Razor, PT 06/23/2015 11:48 AM Phone: [phone] Fax: [phone]

## 2015-06-25 ENCOUNTER — Ambulatory Visit: Payer: Medicare Other | Admitting: Physical Therapy

## 2015-06-25 DIAGNOSIS — R29898 Other symptoms and signs involving the musculoskeletal system: Secondary | ICD-10-CM | POA: Diagnosis not present

## 2015-06-25 DIAGNOSIS — M25562 Pain in left knee: Secondary | ICD-10-CM

## 2015-06-25 DIAGNOSIS — M25561 Pain in right knee: Secondary | ICD-10-CM

## 2015-06-25 DIAGNOSIS — R2689 Other abnormalities of gait and mobility: Secondary | ICD-10-CM | POA: Diagnosis not present

## 2015-06-25 NOTE — Therapy (Signed)
[place], [place], [address] Phone: [phone]   Fax:  [phone]  Physical Therapy Treatment  Patient Details  Name: Courtney [name] MRN: [id number] Date of Birth: 02-May-1975 Referring Provider: Dr. Hulan Saas   Encounter Date: 06/25/2015      PT End of Session - 06/25/15 1023    Visit Number 6   Number of Visits 16   Date for PT Re-Evaluation 07/31/15   PT Start Time 0933   PT Stop Time 1030   PT Time Calculation (min) 57 min   Activity Tolerance Patient tolerated treatment well;No increased pain   Behavior During Therapy [facility] for tasks assessed/performed      Past Medical History  Diagnosis Date  . GERD (gastroesophageal reflux disease)   . Fibromyalgia   . Chronic fatigue   . CTS (carpal tunnel syndrome)   . Anemia, B12 deficiency   . Multiple sclerosis ([place])   . Hyperlipidemia   . C. difficile colitis   . Anxiety   . Heart murmur   . VITAMIN D DEFICIENCY 01/13/2010  . OBSTRUCTIVE SLEEP APNEA 12/08/2008  . Irritable bowel syndrome 05/24/2010  . HYPERLIPIDEMIA 03/13/2007  . ESSENTIAL HYPERTENSION 03/13/2007  . CARPAL TUNNEL SYNDROME, BILATERAL 01/13/2010  . ALLERGIC RHINITIS 03/13/2007  . Cervical lesion 07/13/2011    Spine lesion - indeterminate, for f/u MRI may 2013 per neurology  . Diabetes mellitus without complication ([place])   . Allergy   . Sleep apnea     uses c pap  . Hx of adenomatous polyp of colon 06/09/2009    Past Surgical History  Procedure Laterality Date  . Carpal tunnel release  01/2010    bilateral  . Colonoscopy w/ biopsies and polypectomy  05/2009    2 tubular adenomas  . Laparoscopic appendectomy N/A 07/23/2012    Procedure: APPENDECTOMY LAPAROSCOPIC;  Surgeon: Gayland Curry, MD;  Location: [facility];  Service: General;  Laterality: N/A;  . Appendectomy  07/23/12    There were no vitals filed for this visit.  Visit Diagnosis:  Weakness of both legs  Arthralgia of  both knees  Decreased functional mobility      Subjective Assessment - 06/25/15 0932    Subjective 2/10.  All of a sudden something flipped and I'm doing good.  Pops have not changed. I don't feel like I have a real problem with balance.  So lets do the TUG.   Currently in Pain? Yes   Pain Score 2    Pain Location Knee   Pain Orientation Right;Left;Anterior   Pain Descriptors / Indicators Aching            [facility] PT Assessment - 06/25/15 0001    Circumferential Edema   Circumferential - Right 18 9/16   Circumferential - Left  17 3/4 inches   Other:   Other/ Comments TUG 11 seconds                     [facility] Adult PT Treatment/Exercise - 06/25/15 0938    Knee/Hip Exercises: Stretches   Other Knee/Hip Stretches knee flexion with sheet x 5, 15 sec    Knee/Hip Exercises: Aerobic   Nustep 6 minutes L6, arms/legs    Knee/Hip Exercises: Standing   Other Standing Knee Exercises hip hinge 2 positions,  pain free   Knee/Hip Exercises: Seated   Long Arc Quad Strengthening   Long Arc Quad Limitations RT 20 X,  LT 10  X AA  extension, 5 second holds then slowlt lower.    Stool Scoot - Round Trips 20 X1, 24 X1 feet.   Sit to Sand 2 sets;5 reps  different height   Knee/Hip Exercises: Supine   Quad Sets Strengthening;10 reps;2 sets;Both   Quad Sets Limitations Patella moves slightly  LT>RT   Short Arc [employer] Strengthening;10 reps  with strap. AROM    Vasopneumatic   Number Minutes Vasopneumatic  15 minutes   Vasopnuematic Location  Knee  RT   Vasopneumatic Pressure High   Vasopneumatic Temperature  32                PT Education - 06/25/15 1023    Education provided Yes   Education Details Try stool scoot at home   Person(s) Educated Patient   Methods Explanation;Demonstration;Verbal cues   Comprehension Verbalized understanding;Returned demonstration          PT Short Term Goals - 06/23/15 1126    PT SHORT TERM GOAL #1   Title Pt will be I with  initial HEP for LE strength    Status Achieved   PT SHORT TERM GOAL #2   Title Pt will be able to perform L knee SAQ without assist x 10 to demo increased strength    PT SHORT TERM GOAL #3   Title Pt will be able to report 25% less pain and stiffness/pain with sit to stand transfers    Baseline no pain today, but has had recently   Status Partially Met           PT Long Term Goals - 06/23/15 1128    PT LONG TERM GOAL #1   Title Pt will be I with concepts of RICE, body mechanics to prevent reinjury   Status Achieved   PT LONG TERM GOAL #2   Title Pt will score <38% limited on FOTO to demo functional improvement    Status Unable to assess   PT LONG TERM GOAL #3   Title Pt will be able to perform a modified squat in order to be able to retrieve items from floor using good body mechanics.    Status On-going   PT LONG TERM GOAL #4   Title Pt will be able to strengthen knee and hip muscle groups by 1/2 muscle grade each.     Status On-going   PT LONG TERM GOAL #5   Title Pt will be able to negotiate stairs in home (6 large) 2 times per day with only min difficulty.    Baseline improving   Status Partially Met               Plan - 06/25/15 1024    Clinical Impression Statement No pain at end of session.  TUG 11 seconds.  She was able to recruite muscles from whole LE to do hip hinge.  No new goals met.  Edema increased some RT knee today.   PT Next Visit Plan Quad and hamstring strength   PT Home Exercise Plan stool scoot   Consulted and Agree with Plan of Care Patient        Problem List Patient Active Problem List   Diagnosis Date Noted  . Patella alta 05/06/2015  . Patellofemoral arthritis 02/25/2015  . Bilateral knee pain 02/13/2015  . Nausea without vomiting 08/11/2014  . DM type 2 (diabetes mellitus, type 2) ([place]) 08/11/2014  . Vitamin B 12 deficiency 04/24/2014  . Acute appendicitis without mention of peritonitis 07/23/2012  . Fatigue  01/09/2012  .  Cervical lesion 07/13/2011  . Chest pain 12/23/2010  . Arm swelling 12/23/2010  . Preventative health care 10/20/2010  . Contraceptive management 10/17/2010  . Irritable bowel syndrome 05/24/2010  . VITAMIN D DEFICIENCY 01/13/2010  . CARPAL TUNNEL SYNDROME, BILATERAL 01/13/2010  . Hx of adenomatous polyp of colon 06/09/2009  . Obstructive sleep apnea 12/08/2008  . HYPERSOMNIA UNSPECIFIED 10/30/2008  . CHRONIC DIARRHEA SUSPECT IBS 10/30/2008  . ANXIETY 09/25/2007  . RASH-NONVESICULAR 08/22/2007  . LIPOMA 04/30/2007  . Hyperlipidemia 03/13/2007  . Multiple sclerosis ([place]) 03/13/2007  . Essential hypertension 03/13/2007  . ALLERGIC RHINITIS 03/13/2007  . CLOSTRIDIUM DIFFICILE COLITIS, HX OF 03/13/2007  . GERD 09/18/2006    [facility] 06/25/2015, 6:43 PM  [facility] [address] [place], [place], [address] Phone: [phone]   Fax:  [phone]  Name: SYNAI PRETTYMAN MRN: [id number] Date of Birth: December 17, 1975    Melvenia Needles, PTA 06/25/2015 6:43 PM Phone: [phone] Fax: [phone]

## 2015-06-29 ENCOUNTER — Ambulatory Visit: Payer: Medicare Other | Admitting: Physical Therapy

## 2015-06-29 DIAGNOSIS — R2689 Other abnormalities of gait and mobility: Secondary | ICD-10-CM

## 2015-06-29 DIAGNOSIS — M25561 Pain in right knee: Secondary | ICD-10-CM | POA: Diagnosis not present

## 2015-06-29 DIAGNOSIS — M25562 Pain in left knee: Secondary | ICD-10-CM

## 2015-06-29 DIAGNOSIS — R29898 Other symptoms and signs involving the musculoskeletal system: Secondary | ICD-10-CM | POA: Diagnosis not present

## 2015-06-29 NOTE — Therapy (Addendum)
[place], [place], [address] Phone: [phone]   Fax:  [phone]  Physical Therapy Treatment  Patient Details  Name: Courtney [name] MRN: [id number] Date of Birth: 27-Dec-1975 Referring Provider: Dr. Hulan Saas   Encounter Date: 06/29/2015      PT End of Session - 06/29/15 0936    Visit Number 7   Number of Visits 16   Date for PT Re-Evaluation 07/31/15   PT Start Time 0930   PT Stop Time 1030   PT Time Calculation (min) 60 min   Activity Tolerance Patient tolerated treatment well   Behavior During Therapy [facility] for tasks assessed/performed      Past Medical History  Diagnosis Date  . GERD (gastroesophageal reflux disease)   . Fibromyalgia   . Chronic fatigue   . CTS (carpal tunnel syndrome)   . Anemia, B12 deficiency   . Multiple sclerosis ([place])   . Hyperlipidemia   . C. difficile colitis   . Anxiety   . Heart murmur   . VITAMIN D DEFICIENCY 01/13/2010  . OBSTRUCTIVE SLEEP APNEA 12/08/2008  . Irritable bowel syndrome 05/24/2010  . HYPERLIPIDEMIA 03/13/2007  . ESSENTIAL HYPERTENSION 03/13/2007  . CARPAL TUNNEL SYNDROME, BILATERAL 01/13/2010  . ALLERGIC RHINITIS 03/13/2007  . Cervical lesion 07/13/2011    Spine lesion - indeterminate, for f/u MRI may 2013 per neurology  . Diabetes mellitus without complication ([place])   . Allergy   . Sleep apnea     uses c pap  . Hx of adenomatous polyp of colon 06/09/2009    Past Surgical History  Procedure Laterality Date  . Carpal tunnel release  01/2010    bilateral  . Colonoscopy w/ biopsies and polypectomy  05/2009    2 tubular adenomas  . Laparoscopic appendectomy N/A 07/23/2012    Procedure: APPENDECTOMY LAPAROSCOPIC;  Surgeon: Gayland Curry, MD;  Location: [facility];  Service: General;  Laterality: N/A;  . Appendectomy  07/23/12    There were no vitals filed for this visit.  Visit Diagnosis:  Weakness of both legs  Arthralgia of both  knees  Decreased functional mobility      Subjective Assessment - 06/29/15 0928    Subjective My pain is the same, about a 2/10 in Rt. knee.        Pilates Reformer used for LE/core strength, postural strength, lumbopelvic disassociation and core control.  Exercises included: Footwork 2 Red 1 Blue parallel and forefoot.  Needed to modify for 1st positon.  2 Red single leg cues for full ext L>R Bridging all springs with ball squeeze x 10  Feet in Straps 1 Red 1 Yellow arcs,  Single leg 1 Red for hamstring and ITB stretch each LE. Opposite leg gets an ant hip stretch   NuStep L5 Ue and LE for 6:30 post for endurance, ROM       [facility] Adult PT Treatment/Exercise - 06/29/15 0939    Knee/Hip Exercises: Machines for Strengthening   Other Machine PIlates Reformer    Vasopneumatic   Number Minutes Vasopneumatic  15 minutes   Vasopnuematic Location  Knee  RT   Vasopneumatic Pressure High   Vasopneumatic Temperature  32          PT Education - 06/29/15 1010    Education provided Yes   Education Details PIlates Reformer and concepts of alignment, core    Person(s) Educated Patient   Methods Explanation;Verbal cues;Tactile cues   Comprehension Verbalized understanding;Returned demonstration;Need further instruction  PT Short Term Goals - 06/23/15 1126    PT SHORT TERM GOAL #1   Title Pt will be I with initial HEP for LE strength    Status Achieved   PT SHORT TERM GOAL #2   Title Pt will be able to perform L knee SAQ without assist x 10 to demo increased strength    PT SHORT TERM GOAL #3   Title Pt will be able to report 25% less pain and stiffness/pain with sit to stand transfers    Baseline no pain today, but has had recently   Status Partially Met           PT Long Term Goals - 06/23/15 1128    PT LONG TERM GOAL #1   Title Pt will be I with concepts of RICE, body mechanics to prevent reinjury   Status Achieved   PT LONG TERM GOAL #2   Title Pt will  score <38% limited on FOTO to demo functional improvement    Status Unable to assess   PT LONG TERM GOAL #3   Title Pt will be able to perform a modified squat in order to be able to retrieve items from floor using good body mechanics.    Status On-going   PT LONG TERM GOAL #4   Title Pt will be able to strengthen knee and hip muscle groups by 1/2 muscle grade each.     Status On-going   PT LONG TERM GOAL #5   Title Pt will be able to negotiate stairs in home (6 large) 2 times per day with only min difficulty.    Baseline improving   Status Partially Met               Plan - 06/29/15 1011    Clinical Impression Statement Patient able to use Pilates Reformer for strengthening and stretching without increase in pain.  Pt felt a good stretch for post knees (hamstrings).  Has difficulty with getting last degrees of TKE due to quad weakness.    PT Next Visit Plan Quad and hamstring strength   PT Home Exercise Plan none new today   Consulted and Agree with Plan of Care Patient        Problem List Patient Active Problem List   Diagnosis Date Noted  . Patella alta 05/06/2015  . Patellofemoral arthritis 02/25/2015  . Bilateral knee pain 02/13/2015  . Nausea without vomiting 08/11/2014  . DM type 2 (diabetes mellitus, type 2) ([place]) 08/11/2014  . Vitamin B 12 deficiency 04/24/2014  . Acute appendicitis without mention of peritonitis 07/23/2012  . Fatigue 01/09/2012  . Cervical lesion 07/13/2011  . Chest pain 12/23/2010  . Arm swelling 12/23/2010  . Preventative health care 10/20/2010  . Contraceptive management 10/17/2010  . Irritable bowel syndrome 05/24/2010  . VITAMIN D DEFICIENCY 01/13/2010  . CARPAL TUNNEL SYNDROME, BILATERAL 01/13/2010  . Hx of adenomatous polyp of colon 06/09/2009  . Obstructive sleep apnea 12/08/2008  . HYPERSOMNIA UNSPECIFIED 10/30/2008  . CHRONIC DIARRHEA SUSPECT IBS 10/30/2008  . ANXIETY 09/25/2007  . RASH-NONVESICULAR 08/22/2007  . LIPOMA  04/30/2007  . Hyperlipidemia 03/13/2007  . Multiple sclerosis ([place]) 03/13/2007  . Essential hypertension 03/13/2007  . ALLERGIC RHINITIS 03/13/2007  . CLOSTRIDIUM DIFFICILE COLITIS, HX OF 03/13/2007  . GERD 09/18/2006    [name] 06/29/2015, 10:14 AM  [facility] [address] [place], [place], [address] Phone: [phone]   Fax:  [phone]  Name: Courtney [name] MRN: [id number]  Date of Birth: 1975-11-28    Raeford Razor, PT 06/29/2015 10:15 AM Phone: [phone] Fax: [phone]   PHYSICAL THERAPY DISCHARGE SUMMARY  Visits from Start of Care: 7  Current functional level related to goals / functional outcomes: Unknown, see above for most recent info   Remaining deficits: Unknown   Education / Equipment: HEP, posture, core strength, LE alignment Plan: Patient agrees to discharge.  Patient goals were partially met. Patient is being discharged due to not returning since the last visit.  ?????  Raeford Razor, PT 12/01/15 9:25 AM Phone: [phone] Fax: [phone]

## 2015-07-02 ENCOUNTER — Ambulatory Visit: Payer: Medicare Other | Admitting: Physical Therapy

## 2015-07-08 ENCOUNTER — Encounter: Payer: Medicare Other | Admitting: Physical Therapy

## 2015-07-09 ENCOUNTER — Encounter: Payer: Medicare Other | Admitting: Physical Therapy

## 2015-07-13 ENCOUNTER — Encounter: Payer: Medicare Other | Admitting: Physical Therapy

## 2015-07-14 ENCOUNTER — Encounter: Payer: Medicare Other | Admitting: Physical Therapy

## 2015-07-15 ENCOUNTER — Ambulatory Visit (INDEPENDENT_AMBULATORY_CARE_PROVIDER_SITE_OTHER): Payer: Medicare Other

## 2015-07-15 DIAGNOSIS — Z308 Encounter for other contraceptive management: Secondary | ICD-10-CM

## 2015-07-15 DIAGNOSIS — E538 Deficiency of other specified B group vitamins: Secondary | ICD-10-CM | POA: Diagnosis not present

## 2015-07-15 MED ORDER — MEDROXYPROGESTERONE ACETATE 150 MG/ML IM SUSY
150.0000 mg | PREFILLED_SYRINGE | INTRAMUSCULAR | Status: AC
  Administered 2015-07-15: 150 mg via INTRAMUSCULAR

## 2015-07-15 MED ORDER — CYANOCOBALAMIN 1000 MCG/ML IJ SOLN
1000.0000 ug | Freq: Once | INTRAMUSCULAR | Status: AC
  Administered 2015-07-15: 1000 ug via INTRAMUSCULAR

## 2015-07-20 ENCOUNTER — Encounter: Payer: Medicare Other | Admitting: Physical Therapy

## 2015-07-21 ENCOUNTER — Encounter: Payer: Medicare Other | Admitting: Physical Therapy

## 2015-08-07 ENCOUNTER — Encounter: Payer: Self-pay | Admitting: Endocrinology

## 2015-08-12 ENCOUNTER — Ambulatory Visit (INDEPENDENT_AMBULATORY_CARE_PROVIDER_SITE_OTHER): Payer: Medicare Other

## 2015-08-12 DIAGNOSIS — E538 Deficiency of other specified B group vitamins: Secondary | ICD-10-CM

## 2015-08-12 MED ORDER — CYANOCOBALAMIN 1000 MCG/ML IJ SOLN
1000.0000 ug | Freq: Once | INTRAMUSCULAR | Status: AC
  Administered 2015-08-12: 1000 ug via INTRAMUSCULAR

## 2015-08-24 ENCOUNTER — Ambulatory Visit (INDEPENDENT_AMBULATORY_CARE_PROVIDER_SITE_OTHER): Payer: Medicare Other | Admitting: Endocrinology

## 2015-08-24 ENCOUNTER — Encounter: Payer: Self-pay | Admitting: Endocrinology

## 2015-08-24 VITALS — BP 110/88 | HR 85 | Wt 196.6 lb

## 2015-08-24 DIAGNOSIS — E119 Type 2 diabetes mellitus without complications: Secondary | ICD-10-CM | POA: Diagnosis not present

## 2015-08-24 DIAGNOSIS — Z794 Long term (current) use of insulin: Secondary | ICD-10-CM

## 2015-08-24 LAB — POCT GLYCOSYLATED HEMOGLOBIN (HGB A1C): HEMOGLOBIN A1C: 10.3

## 2015-08-24 MED ORDER — INSULIN LISPRO 100 UNIT/ML (KWIKPEN): 5.0000 [IU] | PEN_INJECTOR | Freq: Three times a day (TID) | SUBCUTANEOUS | Status: DC

## 2015-08-24 NOTE — Progress Notes (Signed)
Subjective:    Patient ID: MARIGOLD MOM, female    DOB: [birth year], 40 y.o.   MRN: [id number]  HPI Pt returns for f/u of diabetes mellitus: DM type: 2 Dx'ed: 2016, when she presented with nonketotic hyperosmolar hyperglycemic state, but not DKA Complications: none Therapy: 2 oral meds. GDM: never. DKA: never. Severe hypoglycemia: never.  Pancreatitis: never.   Other: she took lantus for a brief time in early 2016; metformin dosage is limited by nausea.   She declines weight loss surgery Interval history: She takes 2 orals as rx'ed.  she brings a record of her cbg's which i have reviewed today.  It varies from 300-500.  There is no trend throughout the day. Past Medical History  Diagnosis Date  . GERD (gastroesophageal reflux disease)   . Fibromyalgia   . Chronic fatigue   . CTS (carpal tunnel syndrome)   . Anemia, B12 deficiency   . Multiple sclerosis ([place])   . Hyperlipidemia   . C. difficile colitis   . Anxiety   . Heart murmur   . VITAMIN D DEFICIENCY 01/13/2010  . OBSTRUCTIVE SLEEP APNEA 12/08/2008  . Irritable bowel syndrome 05/24/2010  . HYPERLIPIDEMIA 03/13/2007  . ESSENTIAL HYPERTENSION 03/13/2007  . CARPAL TUNNEL SYNDROME, BILATERAL 01/13/2010  . ALLERGIC RHINITIS 03/13/2007  . Cervical lesion 07/13/2011    Spine lesion - indeterminate, for f/u MRI may 2013 per neurology  . Diabetes mellitus without complication ([place])   . Allergy   . Sleep apnea     uses c pap  . Hx of adenomatous polyp of colon 06/09/2009    Past Surgical History  Procedure Laterality Date  . Carpal tunnel release  01/2010    bilateral  . Colonoscopy w/ biopsies and polypectomy  05/2009    2 tubular adenomas  . Laparoscopic appendectomy N/A 07/23/2012    Procedure: APPENDECTOMY LAPAROSCOPIC;  Surgeon: Gayland Curry, MD;  Location: [facility];  Service: General;  Laterality: N/A;  . Appendectomy  07/23/12    Social History   Social History  . Marital Status: Married    Spouse Name: N/A  . Number  of Children: 2  . Years of Education: N/A   Occupational History  . 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 Smoking status: Never Smoker   . Smokeless tobacco: Never Used  . Alcohol Use: No  . Drug Use: No  . Sexual Activity: Not on file   Other Topics Concern  . Not on file   Social History Narrative   Daily Caffeine    Current Outpatient Prescriptions on File Prior to Visit  Medication Sig Dispense Refill  . baclofen (LIORESAL) 10 MG tablet Take 10 mg by mouth daily at 2 PM daily at 2 PM.    . cetirizine (ZYRTEC) 10 MG tablet Take 10 mg by mouth daily.      . Cholecalciferol (VITAMIN D) 2000 UNITS CAPS Take 1 capsule by mouth every morning. Reported on 06/05/2015    . diclofenac sodium (VOLTAREN) 1 % GEL Apply 4 g topically 4 (four) times daily as needed. 400 g 11  . Dimethyl Fumarate (TECFIDERA) 240 MG CPDR Take by mouth 2 (two) times daily. Reported on 06/05/2015    . furosemide (LASIX) 40 MG tablet TAKE 1 TABLET DAILY 90 tablet 2  . gabapentin (NEURONTIN) 300 MG capsule Take 300 mg by mouth 3 (three) times daily.     Marland Kitchen gemfibrozil (LOPID) 600 MG tablet TAKE 1 TABLET  TWICE A DAY BEFORE MEALS 180 tablet 2  . glucose monitoring kit (FREESTYLE) monitoring kit 1 each by Does not apply route as needed for other. Dispense any model that is covered- dispense testing supplies for Q AC/ HS accuchecks- 1 month supply with one refil. 1 each 1  . INSULIN SYRINGE .5CC/28G 28G X 1/2" 0.5 ML MISC 1 each by Does not apply route 4 (four) times daily -  before meals and at bedtime. 100 each 6  . KLOR-CON M10 10 MEQ tablet TAKE 1 TABLET DAILY 90 tablet 1  . loperamide (IMODIUM) 2 MG capsule Take 2 mg by mouth as needed for diarrhea or loose stools.    . medroxyPROGESTERone (DEPO-PROVERA) 150 MG/ML injection Inject 150 mg into the muscle every 3 (three) months.      . metFORMIN (GLUCOPHAGE-XR) 500 MG 24 hr tablet Take 1 tablet (500 mg total) by mouth 2 (two) times daily. 180 tablet 2   . metoprolol (LOPRESSOR) 100 MG tablet TAKE 2 TABLETS DAILY 180 tablet 1  . nortriptyline (PAMELOR) 50 MG capsule Take 100 mg by mouth at bedtime. At bedtime    . omeprazole-sodium bicarbonate (ZEGERID) 40-1100 MG per capsule Take 1 capsule by mouth daily.      . sitaGLIPtin (JANUVIA) 100 MG tablet Take 1 tablet (100 mg total) by mouth daily. 90 tablet 3  . Vitamin D, Ergocalciferol, (DRISDOL) 50000 units CAPS capsule TAKE 1 CAPSULE BY MOUTH EVERY 7 DAYS 12 capsule 0   No current facility-administered medications on file prior to visit.    Allergies  Allergen Reactions  . Ciprofloxacin In D5w Rash  . Penicillins     REACTION: rash  . Tricor [Fenofibrate] Rash    Family History  Problem Relation Age of Onset  . Migraines Mother   . Prostate cancer Maternal Uncle     prostate cancer  . Lung cancer Maternal Grandmother   . Stroke Maternal Grandfather   . Heart attack Maternal Grandfather   . Diabetes Other   . Hypertension Other   . Colon cancer Neg Hx   . Esophageal cancer Neg Hx   . Rectal cancer Neg Hx   . Stomach cancer Neg Hx   . Hypertension Father   . COPD Father   . Diabetes Father     BP 110/88 mmHg  Pulse 85  Wt 196 lb 9.6 oz (89.177 kg)  SpO2 99%  Review of Systems She has lost a few lbs.      Objective:   Physical Exam VITAL SIGNS:  See vs page GENERAL: no distress Pulses: dorsalis pedis intact bilat.   MSK: no deformity of the feet CV: no leg edema Skin:  no ulcer on the feet.  normal color and temp on the feet. Neuro: sensation is intact to touch on the feet.     A1c=10.3%    Assessment & Plan:  DM: worse for uncertain reason--prob disease progression.    Patient is advised the following: Patient Instructions  check your blood sugar twice a day.  vary the time of day when you check, between before the 3 meals, and at bedtime.  also check if you have symptoms of your blood sugar being too high or too low.  please keep a record of the readings  and bring it to your next appointment here.  You can write it on any piece of paper.  please call us sooner if your blood sugar goes below 70, or if you have a lot of  readings over 200, so we can add "invokana."   i have sent a prescription to your pharmacy, to resume the insulin.  You would take this just before you eat, and only when you eat.  Please call is a few days, to tell us how the blood sugar is doing.   When the sugar improved to the 100's, the next step will be to stop the diabetes pills, and readjust the insulin as we need to.   Please come back for a follow-up appointment in 3 months.

## 2015-08-24 NOTE — Patient Instructions (Addendum)
check your blood sugar twice a day.  vary the time of day when you check, between before the 3 meals, and at bedtime.  also check if you have symptoms of your blood sugar being too high or too low.  please keep a record of the readings and bring it to your next appointment here.  You can write it on any piece of paper.  please call us sooner if your blood sugar goes below 70, or if you have a lot of readings over 200, so we can add "invokana."   i have sent a prescription to your pharmacy, to resume the insulin.  You would take this just before you eat, and only when you eat.  Please call is a few days, to tell us how the blood sugar is doing.   When the sugar improved to the 100's, the next step will be to stop the diabetes pills, and readjust the insulin as we need to.   Please come back for a follow-up appointment in 3 months.

## 2015-08-25 DIAGNOSIS — E119 Type 2 diabetes mellitus without complications: Secondary | ICD-10-CM | POA: Insufficient documentation

## 2015-08-25 DIAGNOSIS — E1165 Type 2 diabetes mellitus with hyperglycemia: Secondary | ICD-10-CM | POA: Insufficient documentation

## 2015-08-26 ENCOUNTER — Other Ambulatory Visit: Payer: Self-pay | Admitting: Family Medicine

## 2015-08-26 NOTE — Telephone Encounter (Signed)
Refill done.  

## 2015-08-31 ENCOUNTER — Telehealth: Payer: Self-pay | Admitting: Endocrinology

## 2015-08-31 NOTE — Telephone Encounter (Signed)
Ok, please increase the humalog to 15 units 3 times a day (just before each meal) Call in 2-3 days, to report cbg's

## 2015-08-31 NOTE — Telephone Encounter (Signed)
Pt advised of note below and voiced understanding.  

## 2015-08-31 NOTE — Telephone Encounter (Signed)
See note below and please advise, Thanks! 

## 2015-08-31 NOTE — Telephone Encounter (Signed)
PT said that she has been taking the new Rx for the [facility] and that her sugars are still reading in the high 300's to 400's

## 2015-09-08 ENCOUNTER — Other Ambulatory Visit: Payer: Self-pay | Admitting: Endocrinology

## 2015-09-08 ENCOUNTER — Encounter: Payer: Self-pay | Admitting: Endocrinology

## 2015-09-08 MED ORDER — INSULIN LISPRO 100 UNIT/ML (KWIKPEN): 25.0000 [IU] | PEN_INJECTOR | Freq: Three times a day (TID) | SUBCUTANEOUS | Status: DC

## 2015-09-27 ENCOUNTER — Other Ambulatory Visit: Payer: Self-pay | Admitting: Endocrinology

## 2015-09-27 ENCOUNTER — Other Ambulatory Visit: Payer: Self-pay | Admitting: Internal Medicine

## 2015-09-27 ENCOUNTER — Encounter: Payer: Self-pay | Admitting: Endocrinology

## 2015-09-28 ENCOUNTER — Other Ambulatory Visit: Payer: Self-pay

## 2015-09-28 MED ORDER — GLUCOSE BLOOD VI STRP: ORAL_STRIP | Status: DC

## 2015-09-28 MED ORDER — FREESTYLE LANCETS MISC: Status: DC

## 2015-09-29 ENCOUNTER — Encounter: Payer: Self-pay | Admitting: Endocrinology

## 2015-09-30 ENCOUNTER — Other Ambulatory Visit: Payer: Self-pay

## 2015-09-30 MED ORDER — GLUCOSE BLOOD VI STRP: ORAL_STRIP | Status: DC

## 2015-09-30 MED ORDER — FREESTYLE SYSTEM KIT: 1.0000 | PACK | Status: AC | PRN

## 2015-10-07 ENCOUNTER — Telehealth: Payer: Self-pay | Admitting: *Deleted

## 2015-10-07 NOTE — Telephone Encounter (Signed)
I contacted the pt and advised Dr. Loanne Drilling submitted the glucometer supplies on 09/30/2015 to [facility] per her request. Pt was advised to contact her pharmacy to verify if they have the prescription.

## 2015-10-07 NOTE — Telephone Encounter (Signed)
Left msg on triage stating never receive pt diabetic supplies that was suppose to have been sent. Per chart pt see Dr. Loanne Drilling for diabetes rx was sent on 6/14. Forwarding msg to Endo Dr. Loanne Drilling nurse to f/u...Johny Chess

## 2015-10-10 DIAGNOSIS — G35 Multiple sclerosis: Secondary | ICD-10-CM | POA: Diagnosis not present

## 2015-10-15 ENCOUNTER — Ambulatory Visit (INDEPENDENT_AMBULATORY_CARE_PROVIDER_SITE_OTHER): Payer: Medicare Other

## 2015-10-15 DIAGNOSIS — Z30018 Encounter for initial prescription of other contraceptives: Secondary | ICD-10-CM | POA: Diagnosis not present

## 2015-10-15 MED ORDER — MEDROXYPROGESTERONE ACETATE 150 MG/ML IM SUSP
150.0000 mg | Freq: Once | INTRAMUSCULAR | Status: AC
  Administered 2015-10-15: 150 mg via INTRAMUSCULAR

## 2015-10-15 MED ORDER — CYANOCOBALAMIN 1000 MCG/ML IJ SOLN
1000.0000 ug | Freq: Once | INTRAMUSCULAR | Status: AC
  Administered 2015-10-15: 1000 ug via INTRAMUSCULAR

## 2015-10-15 NOTE — Progress Notes (Deleted)
   Subjective:    Patient ID: Courtney [name], female    DOB: 1975/06/16, 40 y.o.   MRN: [id number]  HPI    Review of Systems     Objective:   Physical Exam        Assessment & Plan:

## 2015-10-26 DIAGNOSIS — G35 Multiple sclerosis: Secondary | ICD-10-CM | POA: Diagnosis not present

## 2015-12-04 ENCOUNTER — Other Ambulatory Visit: Payer: Self-pay | Admitting: *Deleted

## 2015-12-09 ENCOUNTER — Other Ambulatory Visit: Payer: Self-pay | Admitting: *Deleted

## 2015-12-09 MED ORDER — POTASSIUM CHLORIDE CRYS ER 10 MEQ PO TBCR: 10.0000 meq | EXTENDED_RELEASE_TABLET | Freq: Every day | ORAL | 0 refills | Status: DC

## 2016-01-14 ENCOUNTER — Ambulatory Visit (INDEPENDENT_AMBULATORY_CARE_PROVIDER_SITE_OTHER): Payer: Medicare Other | Admitting: Emergency Medicine

## 2016-01-14 DIAGNOSIS — E538 Deficiency of other specified B group vitamins: Secondary | ICD-10-CM

## 2016-01-14 DIAGNOSIS — Z30018 Encounter for initial prescription of other contraceptives: Secondary | ICD-10-CM

## 2016-01-14 MED ORDER — MEDROXYPROGESTERONE ACETATE 150 MG/ML IM SUSY
150.0000 mg | PREFILLED_SYRINGE | INTRAMUSCULAR | Status: AC
  Administered 2016-01-14: 150 mg via INTRAMUSCULAR

## 2016-01-14 MED ORDER — CYANOCOBALAMIN 1000 MCG/ML IJ SOLN
1000.0000 ug | Freq: Once | INTRAMUSCULAR | Status: AC
  Administered 2016-01-14: 1000 ug via INTRAMUSCULAR

## 2016-01-19 ENCOUNTER — Other Ambulatory Visit: Payer: Self-pay | Admitting: Internal Medicine

## 2016-02-15 ENCOUNTER — Ambulatory Visit: Payer: Medicare Other

## 2016-02-17 ENCOUNTER — Encounter: Payer: Self-pay | Admitting: Internal Medicine

## 2016-02-17 ENCOUNTER — Other Ambulatory Visit (INDEPENDENT_AMBULATORY_CARE_PROVIDER_SITE_OTHER): Payer: Medicare Other

## 2016-02-17 ENCOUNTER — Other Ambulatory Visit: Payer: Self-pay | Admitting: Internal Medicine

## 2016-02-17 ENCOUNTER — Ambulatory Visit (INDEPENDENT_AMBULATORY_CARE_PROVIDER_SITE_OTHER): Payer: Medicare Other | Admitting: Internal Medicine

## 2016-02-17 VITALS — BP 136/72 | HR 99 | Temp 98.4°F | Resp 20 | Wt 191.2 lb

## 2016-02-17 DIAGNOSIS — E119 Type 2 diabetes mellitus without complications: Secondary | ICD-10-CM | POA: Diagnosis not present

## 2016-02-17 DIAGNOSIS — Z794 Long term (current) use of insulin: Secondary | ICD-10-CM | POA: Diagnosis not present

## 2016-02-17 DIAGNOSIS — E785 Hyperlipidemia, unspecified: Secondary | ICD-10-CM

## 2016-02-17 DIAGNOSIS — Z23 Encounter for immunization: Secondary | ICD-10-CM | POA: Diagnosis not present

## 2016-02-17 DIAGNOSIS — I10 Essential (primary) hypertension: Secondary | ICD-10-CM | POA: Diagnosis not present

## 2016-02-17 LAB — CBC WITH DIFFERENTIAL/PLATELET
Basophils Absolute: 0 10*3/uL (ref 0.0–0.1)
Basophils Relative: 0.6 % (ref 0.0–3.0)
EOS PCT: 7.1 % — AB (ref 0.0–5.0)
Eosinophils Absolute: 0.4 10*3/uL (ref 0.0–0.7)
HCT: 36.8 % (ref 36.0–46.0)
HEMOGLOBIN: 12.4 g/dL (ref 12.0–15.0)
LYMPHS PCT: 19.1 % (ref 12.0–46.0)
Lymphs Abs: 1.1 10*3/uL (ref 0.7–4.0)
MCHC: 33.7 g/dL (ref 30.0–36.0)
MCV: 84.5 fl (ref 78.0–100.0)
MONOS PCT: 7.5 % (ref 3.0–12.0)
Monocytes Absolute: 0.4 10*3/uL (ref 0.1–1.0)
Neutro Abs: 3.7 10*3/uL (ref 1.4–7.7)
Neutrophils Relative %: 65.7 % (ref 43.0–77.0)
Platelets: 334 10*3/uL (ref 150.0–400.0)
RBC: 4.36 Mil/uL (ref 3.87–5.11)
RDW: 16.3 % — ABNORMAL HIGH (ref 11.5–15.5)
WBC: 5.7 10*3/uL (ref 4.0–10.5)

## 2016-02-17 LAB — URINALYSIS, ROUTINE W REFLEX MICROSCOPIC
Hgb urine dipstick: NEGATIVE
KETONES UR: 40 — AB
LEUKOCYTES UA: NEGATIVE
NITRITE: NEGATIVE
PH: 6 (ref 5.0–8.0)
RBC / HPF: NONE SEEN (ref 0–?)
Specific Gravity, Urine: 1.025 (ref 1.000–1.030)
Total Protein, Urine: NEGATIVE
Urine Glucose: >=1000 — AB
Urobilinogen, UA: 0.2 (ref 0.0–1.0)

## 2016-02-17 LAB — HEPATIC FUNCTION PANEL
ALBUMIN: 3.8 g/dL (ref 3.5–5.2)
ALT: 9 U/L (ref 0–35)
AST: 8 U/L (ref 0–37)
Alkaline Phosphatase: 64 U/L (ref 39–117)
Bilirubin, Direct: 0.1 mg/dL (ref 0.0–0.3)
Total Bilirubin: 0.5 mg/dL (ref 0.2–1.2)
Total Protein: 6.9 g/dL (ref 6.0–8.3)

## 2016-02-17 LAB — BASIC METABOLIC PANEL
BUN: 6 mg/dL (ref 6–23)
CALCIUM: 9.5 mg/dL (ref 8.4–10.5)
CO2: 24 mEq/L (ref 19–32)
Chloride: 102 mEq/L (ref 96–112)
Creatinine, Ser: 0.63 mg/dL (ref 0.40–1.20)
GFR: 111.37 mL/min (ref >60.00)
GLUCOSE: 245 mg/dL — AB (ref 70–99)
Potassium: 3.4 mEq/L — ABNORMAL LOW (ref 3.5–5.1)
SODIUM: 139 meq/L (ref 135–145)

## 2016-02-17 LAB — LDL CHOLESTEROL, DIRECT: LDL DIRECT: 121 mg/dL

## 2016-02-17 LAB — LIPID PANEL
Cholesterol: 181 mg/dL (ref 0–200)
HDL: 28.5 mg/dL — AB (ref >39.00)
NonHDL: 152.24
Total CHOL/HDL Ratio: 6
Triglycerides: 304 mg/dL — ABNORMAL HIGH (ref 0.0–149.0)
VLDL: 60.8 mg/dL — AB (ref 0.0–40.0)

## 2016-02-17 LAB — MICROALBUMIN / CREATININE URINE RATIO
Creatinine,U: 145 mg/dL
Microalb Creat Ratio: 1.9 mg/g (ref 0.0–30.0)
Microalb, Ur: 2.7 mg/dL — ABNORMAL HIGH (ref 0.0–1.9)

## 2016-02-17 LAB — HEMOGLOBIN A1C: HEMOGLOBIN A1C: 9.5 % — AB (ref 4.6–6.5)

## 2016-02-17 LAB — TSH: TSH: 1.42 u[IU]/mL (ref 0.35–4.50)

## 2016-02-17 MED ORDER — INSULIN LISPRO 100 UNIT/ML (KWIKPEN): 35.0000 [IU] | PEN_INJECTOR | Freq: Three times a day (TID) | SUBCUTANEOUS | 11 refills | Status: DC

## 2016-02-17 MED ORDER — ROSUVASTATIN CALCIUM 10 MG PO TABS
10.0000 mg | ORAL_TABLET | Freq: Every day | ORAL | 3 refills | Status: DC
Start: 2016-02-17 — End: 2017-01-05

## 2016-02-17 NOTE — Patient Instructions (Addendum)
You had the flu shot today  Please make a Nurse Visit appt for 2 wks to get the Prevnar 13 pneumonia shot  Please remember to make your yearly Diabetic EYE exam with opthalmology, as well as follow up with Endocrinology and Neurology as you do  Please continue all other medications as before, and refills have been done if requested.  Please have the pharmacy call with any other refills you may need.  Please continue your efforts at being more active, low cholesterol diet, and weight control.  You are otherwise up to date with prevention measures today.  Please keep your appointments with your specialists as you may have planned  Please go to the LAB in the Basement (turn left off the elevator) for the tests to be done today  You will be contacted by phone if any changes need to be made immediately.  Otherwise, you will receive a letter about your results with an explanation, but please check with MyChart first.  Please remember to sign up for MyChart if you have not done so, as this will be important to you in the future with finding out test results, communicating by private email, and scheduling acute appointments online when needed.  Please return in 1 year for your yearly visit, or sooner if needed, with Lab testing done 3-5 days before

## 2016-02-17 NOTE — Progress Notes (Signed)
Pre visit review using our clinic review tool, if applicable. No additional management support is needed unless otherwise documented below in the visit note. 

## 2016-02-17 NOTE — Progress Notes (Signed)
Subjective:    Patient ID: Courtney [name], female    DOB: 03-May-1975, 40 y.o.   MRN: [id number]  HPI  Here to f/u; overall doing ok,  Pt denies chest pain, increasing sob or doe, wheezing, orthopnea, PND, increased LE swelling, palpitations, dizziness or syncope.  Pt denies new neurological symptoms such as new headache, or facial or extremity weakness or numbness.  Pt denies polydipsia, polyuria, or low sugar episode, but sugars are consistently uncontrolled with cbg > 200-300.   Pt denies new neurological symptoms such as new headache, or facial or extremity weakness or numbness.   Pt states overall good compliance with meds, mostly trying to follow appropriate diet, with wt overall stable,  but little exercise however.  Plans to be more active but under quite a bit of stress. Denies worsening depressive symptoms, suicidal ideation, or panic;  Has 2 teens and mother and father living with her, pt disabled from [place] since 2009, also has bilateral worsening knee DJD with chronic pain, without effusion, giveaways or falls recently Past Medical History:  Diagnosis Date  . ALLERGIC RHINITIS 03/13/2007  . Allergy   . Anemia, B12 deficiency   . Anxiety   . C. difficile colitis   . CARPAL TUNNEL SYNDROME, BILATERAL 01/13/2010  . Cervical lesion 07/13/2011   Spine lesion - indeterminate, for f/u MRI may 2013 per neurology  . Chronic fatigue   . CTS (carpal tunnel syndrome)   . Diabetes mellitus without complication ([place])   . ESSENTIAL HYPERTENSION 03/13/2007  . Fibromyalgia   . GERD (gastroesophageal reflux disease)   . Heart murmur   . Hx of adenomatous polyp of colon 06/09/2009  . Hyperlipidemia   . HYPERLIPIDEMIA 03/13/2007  . Irritable bowel syndrome 05/24/2010  . Multiple sclerosis ([place])   . OBSTRUCTIVE SLEEP APNEA 12/08/2008  . Sleep apnea    uses c pap  . VITAMIN D DEFICIENCY 01/13/2010   Past Surgical History:  Procedure Laterality Date  . APPENDECTOMY  07/23/12  . CARPAL TUNNEL RELEASE   01/2010   bilateral  . COLONOSCOPY W/ BIOPSIES AND POLYPECTOMY  05/2009   2 tubular adenomas  . LAPAROSCOPIC APPENDECTOMY N/A 07/23/2012   Procedure: APPENDECTOMY LAPAROSCOPIC;  Surgeon: Gayland Curry, MD;  Location: [facility];  Service: General;  Laterality: N/A;    reports that she has never smoked. She has never used smokeless tobacco. She reports that she does not drink alcohol or use drugs. family history includes COPD in her father; Diabetes in her father and other; Heart attack in her maternal grandfather; Hypertension in her father and other; Lung cancer in her maternal grandmother; Migraines in her mother; Prostate cancer in her maternal uncle; Stroke in her maternal grandfather. Allergies  Allergen Reactions  . Ciprofloxacin In D5w Rash  . Penicillins     REACTION: rash  . Tricor [Fenofibrate] Rash   Current Outpatient Prescriptions on File Prior to Visit  Medication Sig Dispense Refill  . baclofen (LIORESAL) 10 MG tablet Take 10 mg by mouth daily at 2 PM daily at 2 PM.    . cetirizine (ZYRTEC) 10 MG tablet Take 10 mg by mouth daily.      . Cholecalciferol (VITAMIN D) 2000 UNITS CAPS Take 1 capsule by mouth every morning. Reported on 06/05/2015    . diclofenac sodium (VOLTAREN) 1 % GEL Apply 4 g topically 4 (four) times daily as needed. 400 g 11  . furosemide (LASIX) 40 MG tablet TAKE 1 TABLET DAILY 90 tablet 2  .  gabapentin (NEURONTIN) 300 MG capsule Take 300 mg by mouth 3 (three) times daily.     Marland Kitchen glucose blood (FREESTYLE TEST STRIPS) test strip Use to check blood sugar 2 times daily. 150 each 2  . glucose monitoring kit (FREESTYLE) monitoring kit 1 each by Does not apply route as needed for other. Dispense any model that is covered- dispense testing supplies for Q AC/ HS accuchecks- 1 month supply with one refil. 1 each 1  . INSULIN SYRINGE .5CC/28G 28G X 1/2" 0.5 ML MISC 1 each by Does not apply route 4 (four) times daily -  before meals and at bedtime. 100 each 6  . JANUVIA 100 MG  tablet TAKE 1 TABLET DAILY 90 tablet 2  . Lancets (FREESTYLE) lancets Use to check blood sugar two time daily. 150 each 2  . loperamide (IMODIUM) 2 MG capsule Take 2 mg by mouth as needed for diarrhea or loose stools.    . medroxyPROGESTERone (DEPO-PROVERA) 150 MG/ML injection Inject 150 mg into the muscle every 3 (three) months.      . metFORMIN (GLUCOPHAGE-XR) 500 MG 24 hr tablet TAKE 1 TABLET TWICE A DAY 180 tablet 1  . metoprolol (LOPRESSOR) 100 MG tablet TAKE 2 TABLETS DAILY (OVERDUE FOR YEARLY PHYSICAL WITH LABS, MUST SEE DOCTOR FOR REFILLS) 180 tablet 0  . nortriptyline (PAMELOR) 50 MG capsule Take 100 mg by mouth at bedtime. At bedtime    . omeprazole-sodium bicarbonate (ZEGERID) 40-1100 MG per capsule Take 1 capsule by mouth daily.      . potassium chloride (KLOR-CON M10) 10 MEQ tablet Take 1 tablet (10 mEq total) by mouth daily. Keep Nov appt for future refills 90 tablet 0  . Vitamin D, Ergocalciferol, (DRISDOL) 50000 units CAPS capsule TAKE 1 CAPSULE BY MOUTH EVERY 7 DAYS 12 capsule 0   No current facility-administered medications on file prior to visit.    Review of Systems  Constitutional: Negative for unusual diaphoresis or night sweats HENT: Negative for ear swelling or discharge Eyes: Negative for worsening visual haziness  Respiratory: Negative for choking and stridor.   Gastrointestinal: Negative for distension or worsening eructation Genitourinary: Negative for retention or change in urine volume.  Musculoskeletal: Negative for other MSK pain or swelling Skin: Negative for color change and worsening wound Neurological: Negative for tremors and numbness other than noted  Psychiatric/Behavioral: Negative for decreased concentration or agitation other than above   All other system neg per pt    Objective:   Physical Exam BP 136/72   Pulse 99   Temp 98.4 F (36.9 C) (Oral)   Resp 20   Wt 191 lb 4 oz (86.8 kg)   SpO2 97%   BMI 36.14 kg/m  VS noted,  Constitutional:  Pt appears in no apparent distress HENT: Head: NCAT.  Right Ear: External ear normal.  Left Ear: External ear normal.  Eyes: . Pupils are equal, round, and reactive to light. Conjunctivae and EOM are normal Neck: Normal range of motion. Neck supple.  Cardiovascular: Normal rate and regular rhythm.   Pulmonary/Chest: Effort normal and breath sounds without rales or wheezing.  Bilat knee crepitus right > left, no effusions, FROM, NT Neurological: Pt is alert. Not confused , motor grossly intact Skin: Skin is warm. No rash, no LE edema Psychiatric: Pt behavior is normal. No agitation.     Assessment & Plan:

## 2016-02-18 ENCOUNTER — Encounter: Payer: Self-pay | Admitting: Internal Medicine

## 2016-02-18 DIAGNOSIS — E785 Hyperlipidemia, unspecified: Secondary | ICD-10-CM

## 2016-02-18 MED ORDER — FENOFIBRATE 145 MG PO TABS: 145.0000 mg | ORAL_TABLET | Freq: Every day | ORAL | 3 refills | Status: DC

## 2016-02-19 ENCOUNTER — Telehealth: Payer: Self-pay | Admitting: Internal Medicine

## 2016-02-19 NOTE — Telephone Encounter (Signed)
Patient is returning your phone call. 

## 2016-02-19 NOTE — Telephone Encounter (Signed)
Patient aware of lab results.

## 2016-02-22 NOTE — Assessment & Plan Note (Signed)
stable overall by history and exam, recent data reviewed with pt, and pt to continue medical treatment as before,  to f/u any worsening symptoms or concerns BP Readings from Last 3 Encounters:  02/17/16 136/72  08/24/15 110/88  05/06/15 114/82

## 2016-02-22 NOTE — Assessment & Plan Note (Signed)
Recently mild to mod uncontrolled, o/w stable overall by history and exam, for f/u lab today , and pt to continue medical treatment as before,  to f/u any worsening symptoms or concerns

## 2016-02-22 NOTE — Assessment & Plan Note (Signed)
For lower chol diet o/w stable overall by history and exam, recent data reviewed with pt, and pt to continue medical treatment as before,  to f/u any worsening symptoms or concerns Lab Results  Component Value Date   CHOL 181 02/17/2016   HDL 28.50 (L) 02/17/2016   LDLDIRECT 121.0 02/17/2016   TRIG 304.0 (H) 02/17/2016   CHOLHDL 6 02/17/2016

## 2016-03-01 ENCOUNTER — Ambulatory Visit (INDEPENDENT_AMBULATORY_CARE_PROVIDER_SITE_OTHER): Payer: Medicare Other | Admitting: Internal Medicine

## 2016-03-01 ENCOUNTER — Encounter: Payer: Self-pay | Admitting: Internal Medicine

## 2016-03-01 VITALS — BP 128/80 | HR 102 | Temp 98.5°F | Resp 20 | Wt 195.0 lb

## 2016-03-01 DIAGNOSIS — J069 Acute upper respiratory infection, unspecified: Secondary | ICD-10-CM | POA: Diagnosis not present

## 2016-03-01 DIAGNOSIS — I10 Essential (primary) hypertension: Secondary | ICD-10-CM | POA: Diagnosis not present

## 2016-03-01 DIAGNOSIS — E119 Type 2 diabetes mellitus without complications: Secondary | ICD-10-CM | POA: Diagnosis not present

## 2016-03-01 DIAGNOSIS — Z794 Long term (current) use of insulin: Secondary | ICD-10-CM

## 2016-03-01 MED ORDER — HYDROCODONE-HOMATROPINE 5-1.5 MG/5ML PO SYRP: 5.0000 mL | ORAL_SOLUTION | Freq: Four times a day (QID) | ORAL | 0 refills | Status: AC | PRN

## 2016-03-01 MED ORDER — AZITHROMYCIN 250 MG PO TABS: ORAL_TABLET | ORAL | 1 refills | Status: DC

## 2016-03-01 NOTE — Patient Instructions (Addendum)
You are given the Prevnar pneumonia shot today  Please take all new medication as prescribed - the antibiotic, and cough medicine if needed  Please continue all other medications as before, and refills have been done if requested.  Please have the pharmacy call with any other refills you may need.  Please keep your appointments with your specialists as you may have planned

## 2016-03-01 NOTE — Progress Notes (Signed)
Pre visit review using our clinic review tool, if applicable. No additional management support is needed unless otherwise documented below in the visit note. 

## 2016-03-02 ENCOUNTER — Ambulatory Visit: Payer: Medicare Other

## 2016-03-06 NOTE — Assessment & Plan Note (Signed)
Asympt, o/w stable overall by history and exam, recent data reviewed with pt, and pt to continue current med tx,  to f/u any worsening symptoms or concerns Lab Results  Component Value Date   HGBA1C 9.5 (H) 02/17/2016

## 2016-03-06 NOTE — Progress Notes (Signed)
Subjective:    Patient ID: Courtney [name], female    DOB: 1975-10-04, 40 y.o.   MRN: [id number]  HPI   Here with 2-3 days acute onset fever, facial pain, pressure, headache, general weakness and malaise, and greenish d/c, with mild ST and cough, but pt denies chest pain, wheezing, increased sob or doe, orthopnea, PND, increased LE swelling, palpitations, dizziness or syncope. Pt denies new neurological symptoms such as new headache, or facial or extremity weakness or numbness   Pt denies polydipsia, polyuria, or low sugar symptoms  Pt states overall good compliance with meds  Past Medical History:  Diagnosis Date  . ALLERGIC RHINITIS 03/13/2007  . Allergy   . Anemia, B12 deficiency   . Anxiety   . C. difficile colitis   . CARPAL TUNNEL SYNDROME, BILATERAL 01/13/2010  . Cervical lesion 07/13/2011   Spine lesion - indeterminate, for f/u MRI may 2013 per neurology  . Chronic fatigue   . CTS (carpal tunnel syndrome)   . Diabetes mellitus without complication ([place])   . ESSENTIAL HYPERTENSION 03/13/2007  . Fibromyalgia   . GERD (gastroesophageal reflux disease)   . Heart murmur   . Hx of adenomatous polyp of colon 06/09/2009  . Hyperlipidemia   . HYPERLIPIDEMIA 03/13/2007  . Irritable bowel syndrome 05/24/2010  . Multiple sclerosis ([place])   . OBSTRUCTIVE SLEEP APNEA 12/08/2008  . Sleep apnea    uses c pap  . VITAMIN D DEFICIENCY 01/13/2010   Past Surgical History:  Procedure Laterality Date  . APPENDECTOMY  07/23/12  . CARPAL TUNNEL RELEASE  01/2010   bilateral  . COLONOSCOPY W/ BIOPSIES AND POLYPECTOMY  05/2009   2 tubular adenomas  . LAPAROSCOPIC APPENDECTOMY N/A 07/23/2012   Procedure: APPENDECTOMY LAPAROSCOPIC;  Surgeon: Gayland Curry, MD;  Location: [facility];  Service: General;  Laterality: N/A;    reports that she has never smoked. She has never used smokeless tobacco. She reports that she does not drink alcohol or use drugs. family history includes COPD in her father; Diabetes in her  father and other; Heart attack in her maternal grandfather; Hypertension in her father and other; Lung cancer in her maternal grandmother; Migraines in her mother; Prostate cancer in her maternal uncle; Stroke in her maternal grandfather. Allergies  Allergen Reactions  . Ciprofloxacin In D5w Rash  . Penicillins     REACTION: rash  . Tricor [Fenofibrate] Rash   Current Outpatient Prescriptions on File Prior to Visit  Medication Sig Dispense Refill  . baclofen (LIORESAL) 10 MG tablet Take 10 mg by mouth daily at 2 PM daily at 2 PM.    . cetirizine (ZYRTEC) 10 MG tablet Take 10 mg by mouth daily.      . Cholecalciferol (VITAMIN D) 2000 UNITS CAPS Take 1 capsule by mouth every morning. Reported on 06/05/2015    . diclofenac sodium (VOLTAREN) 1 % GEL Apply 4 g topically 4 (four) times daily as needed. 400 g 11  . furosemide (LASIX) 40 MG tablet TAKE 1 TABLET DAILY 90 tablet 2  . gabapentin (NEURONTIN) 300 MG capsule Take 300 mg by mouth 3 (three) times daily.     Marland Kitchen glucose blood (FREESTYLE TEST STRIPS) test strip Use to check blood sugar 2 times daily. 150 each 2  . glucose monitoring kit (FREESTYLE) monitoring kit 1 each by Does not apply route as needed for other. Dispense any model that is covered- dispense testing supplies for Q AC/ HS accuchecks- 1 month supply with one refil.  1 each 1  . insulin lispro (HUMALOG KWIKPEN) 100 UNIT/ML KiwkPen Inject 0.35 mLs (35 Units total) into the skin 3 (three) times daily with meals. And pen needles 3/day 30 mL 11  . INSULIN SYRINGE .5CC/28G 28G X 1/2" 0.5 ML MISC 1 each by Does not apply route 4 (four) times daily -  before meals and at bedtime. 100 each 6  . JANUVIA 100 MG tablet TAKE 1 TABLET DAILY 90 tablet 2  . Lancets (FREESTYLE) lancets Use to check blood sugar two time daily. 150 each 2  . loperamide (IMODIUM) 2 MG capsule Take 2 mg by mouth as needed for diarrhea or loose stools.    . medroxyPROGESTERone (DEPO-PROVERA) 150 MG/ML injection Inject 150  mg into the muscle every 3 (three) months.      . metFORMIN (GLUCOPHAGE-XR) 500 MG 24 hr tablet TAKE 1 TABLET TWICE A DAY 180 tablet 1  . metoprolol (LOPRESSOR) 100 MG tablet TAKE 2 TABLETS DAILY (OVERDUE FOR YEARLY PHYSICAL WITH LABS, MUST SEE DOCTOR FOR REFILLS) 180 tablet 0  . nortriptyline (PAMELOR) 50 MG capsule Take 100 mg by mouth at bedtime. At bedtime    . omeprazole-sodium bicarbonate (ZEGERID) 40-1100 MG per capsule Take 1 capsule by mouth daily.      . potassium chloride (KLOR-CON M10) 10 MEQ tablet Take 1 tablet (10 mEq total) by mouth daily. Keep Nov appt for future refills 90 tablet 0  . rosuvastatin (CRESTOR) 10 MG tablet Take 1 tablet (10 mg total) by mouth daily. 90 tablet 3  . Vitamin D, Ergocalciferol, (DRISDOL) 50000 units CAPS capsule TAKE 1 CAPSULE BY MOUTH EVERY 7 DAYS 12 capsule 0   No current facility-administered medications on file prior to visit.    Review of Systems  Constitutional: Negative for unusual diaphoresis or night sweats HENT: Negative for ear swelling or discharge Eyes: Negative for worsening visual haziness  Respiratory: Negative for choking and stridor.   Gastrointestinal: Negative for distension or worsening eructation Genitourinary: Negative for retention or change in urine volume.  Musculoskeletal: Negative for other MSK pain or swelling Skin: Negative for color change and worsening wound Neurological: Negative for tremors and numbness other than noted  Psychiatric/Behavioral: Negative for decreased concentration or agitation other than above   All other system neg per pt    Objective:   Physical Exam BP 128/80   Pulse (!) 102   Temp 98.5 F (36.9 C) (Oral)   Resp 20   Wt 195 lb (88.5 kg)   SpO2 95%   BMI 36.84 kg/m  VS noted,  Constitutional: Pt appears in no apparent distress HENT: Head: NCAT.  Right Ear: External ear normal.  Left Ear: External ear normal.  Eyes: . Pupils are equal, round, and reactive to light. Conjunctivae and  EOM are normal Bilat tm's with mild erythema.  Max sinus areas mild tender.  Pharynx with mild erythema, no exudate Neck: Normal range of motion. Neck supple.  Cardiovascular: Normal rate and regular rhythm.   Pulmonary/Chest: Effort normal and breath sounds decreased without rales or wheezing.  Neurological: Pt is alert. Not confused , motor grossly intact Skin: Skin is warm. No rash, no LE edema Psychiatric: Pt behavior is normal. No agitation.  No other new exam findings    Assessment & Plan:

## 2016-03-06 NOTE — Assessment & Plan Note (Signed)
BP Readings from Last 3 Encounters:  03/01/16 128/80  02/17/16 136/72  08/24/15 110/88   stable overall by history and exam, recent data reviewed with pt, and pt to continue medical treatment as before,  to f/u any worsening symptoms or concerns

## 2016-03-06 NOTE — Assessment & Plan Note (Signed)
Mild to mod, for antibx course,  to f/u any worsening symptoms or concerns 

## 2016-03-22 ENCOUNTER — Other Ambulatory Visit: Payer: Self-pay | Admitting: Internal Medicine

## 2016-03-22 ENCOUNTER — Other Ambulatory Visit: Payer: Self-pay | Admitting: Endocrinology

## 2016-03-22 ENCOUNTER — Encounter: Payer: Self-pay | Admitting: Internal Medicine

## 2016-03-22 MED ORDER — METOPROLOL TARTRATE 100 MG PO TABS: ORAL_TABLET | ORAL | 3 refills | Status: DC

## 2016-04-14 ENCOUNTER — Ambulatory Visit (INDEPENDENT_AMBULATORY_CARE_PROVIDER_SITE_OTHER): Payer: Medicare Other

## 2016-04-14 DIAGNOSIS — Z309 Encounter for contraceptive management, unspecified: Secondary | ICD-10-CM

## 2016-04-14 DIAGNOSIS — E538 Deficiency of other specified B group vitamins: Secondary | ICD-10-CM

## 2016-04-14 MED ORDER — CYANOCOBALAMIN 1000 MCG/ML IJ SOLN
1000.0000 ug | Freq: Once | INTRAMUSCULAR | Status: AC
  Administered 2016-04-14: 1000 ug via INTRAMUSCULAR

## 2016-04-14 MED ORDER — MEDROXYPROGESTERONE ACETATE 150 MG/ML IM SUSP
150.0000 mg | Freq: Once | INTRAMUSCULAR | Status: AC
Start: 2016-04-14 — End: 2016-04-14
  Administered 2016-04-14: 150 mg via INTRAMUSCULAR

## 2016-05-16 ENCOUNTER — Telehealth: Payer: Self-pay

## 2016-05-16 ENCOUNTER — Ambulatory Visit (INDEPENDENT_AMBULATORY_CARE_PROVIDER_SITE_OTHER): Payer: Medicare Other

## 2016-05-16 DIAGNOSIS — E538 Deficiency of other specified B group vitamins: Secondary | ICD-10-CM | POA: Diagnosis not present

## 2016-05-16 MED ORDER — CYANOCOBALAMIN 1000 MCG/ML IJ SOLN
1000.0000 ug | Freq: Once | INTRAMUSCULAR | Status: AC
  Administered 2016-05-16: 1000 ug via INTRAMUSCULAR

## 2016-05-16 NOTE — Telephone Encounter (Signed)
error 

## 2016-05-16 NOTE — Telephone Encounter (Signed)
Patient advised we need to check b12 lab so that we know if b12 injections need to continue, order entered, patient advised to have lab done, we will call patient back with further instructions after lab results

## 2016-05-26 ENCOUNTER — Encounter: Payer: Self-pay | Admitting: Family Medicine

## 2016-05-26 ENCOUNTER — Ambulatory Visit (INDEPENDENT_AMBULATORY_CARE_PROVIDER_SITE_OTHER): Payer: Medicare Other | Admitting: Family Medicine

## 2016-05-26 VITALS — BP 120/88 | HR 74 | Temp 99.1°F | Wt 204.2 lb

## 2016-05-26 DIAGNOSIS — M533 Sacrococcygeal disorders, not elsewhere classified: Secondary | ICD-10-CM | POA: Diagnosis not present

## 2016-05-26 DIAGNOSIS — W19XXXA Unspecified fall, initial encounter: Secondary | ICD-10-CM | POA: Diagnosis not present

## 2016-05-26 NOTE — Patient Instructions (Signed)
Please take your baclofen as directed by your physician. Also, you can add tylenol with baclofen. Please consider heat and use of donut cushion for comfort as needed.   If your symptoms do not improve with these treatments or they persist; imaging will be ordered for you.  Please seek immediate treatment for symptoms that are worsening or you are experiencing numbness, tingling, or weakness or loss of bowel/bladder control.

## 2016-05-26 NOTE — Progress Notes (Signed)
Pre visit review using our clinic review tool, if applicable. No additional management support is needed unless otherwise documented below in the visit note. 

## 2016-05-26 NOTE — Progress Notes (Signed)
Subjective:    Patient ID: Courtney [name], female    DOB: Oct 17, 1975, 41 y.o.   MRN: [id number]  HPI  Courtney [name] is a 41 year old female who presents after a fall on ice 4 days ago. She reports that she walked onto her deck and was unaware that ice had formed overnight. She slipped on the step and then fell onto the step hitting her back and coccyx.  She denies hitting her head and denies cardio/neuro prodrome prior to fall. Lower back pain and "tailbone" pain that is rated as high as a  9 with movement on her tailbone and pain in lower back as a 2 to 3. Described as sharp with movement and pressure when sitting still. She denies numbness, tingling, weakness, saddle anesthesia, fever, chills, dyspnea, dysuria, or pelvic pain.  No treatment at home at this time. Aggravating factors noted as going from sitting to standing, squatting, and climbing steps Alleviating factors noted at this time.    Review of Systems  Constitutional: Negative for chills, fatigue and fever.  Respiratory: Negative for cough, shortness of breath and wheezing.   Cardiovascular: Negative for chest pain and palpitations.  Gastrointestinal: Negative for abdominal pain, diarrhea, nausea and vomiting.  Musculoskeletal: Positive for back pain. Negative for myalgias.       Low back and coccyx pain  Neurological: Negative for dizziness, light-headedness and headaches.   Past Medical History:  Diagnosis Date  . ALLERGIC RHINITIS 03/13/2007  . Allergy   . Anemia, B12 deficiency   . Anxiety   . C. difficile colitis   . CARPAL TUNNEL SYNDROME, BILATERAL 01/13/2010  . Cervical lesion 07/13/2011   Spine lesion - indeterminate, for f/u MRI may 2013 per neurology  . Chronic fatigue   . CTS (carpal tunnel syndrome)   . Diabetes mellitus without complication ([place])   . ESSENTIAL HYPERTENSION 03/13/2007  . Fibromyalgia   . GERD (gastroesophageal reflux disease)   . Heart murmur   . Hx of adenomatous polyp of colon 06/09/2009    . Hyperlipidemia   . HYPERLIPIDEMIA 03/13/2007  . Irritable bowel syndrome 05/24/2010  . Multiple sclerosis ([place])   . OBSTRUCTIVE SLEEP APNEA 12/08/2008  . Sleep apnea    uses c pap  . VITAMIN D DEFICIENCY 01/13/2010     Social History   Social History  . Marital status: Married    Spouse name: N/A  . Number of children: 2  . Years of education: N/A   Occupational History  . disabled since 2009 MS Unemployed   Social History Main Topics  . Smoking status: Never Smoker  . Smokeless tobacco: Never Used  . Alcohol use No  . Drug use: No  . Sexual activity: Not on file   Other Topics Concern  . Not on file   Social History Narrative   Daily Caffeine    Past Surgical History:  Procedure Laterality Date  . APPENDECTOMY  07/23/12  . CARPAL TUNNEL RELEASE  01/2010   bilateral  . COLONOSCOPY W/ BIOPSIES AND POLYPECTOMY  05/2009   2 tubular adenomas  . LAPAROSCOPIC APPENDECTOMY N/A 07/23/2012   Procedure: APPENDECTOMY LAPAROSCOPIC;  Surgeon: Gayland Curry, MD;  Location: [facility] OR;  Service: General;  Laterality: N/A;    Family History  Problem Relation Age of Onset  . Migraines Mother   . Prostate cancer Maternal Uncle     prostate cancer  . Lung cancer Maternal Grandmother   . Stroke Maternal Grandfather   . Heart  attack Maternal Grandfather   . Diabetes Other   . Hypertension Other   . Colon cancer Neg Hx   . Esophageal cancer Neg Hx   . Rectal cancer Neg Hx   . Stomach cancer Neg Hx   . Hypertension Father   . COPD Father   . Diabetes Father     Allergies  Allergen Reactions  . Ciprofloxacin In D5w Rash  . Penicillins     REACTION: rash  . Tricor [Fenofibrate] Rash    Current Outpatient Prescriptions on File Prior to Visit  Medication Sig Dispense Refill  . baclofen (LIORESAL) 10 MG tablet Take 10 mg by mouth daily at 2 PM daily at 2 PM.    . cetirizine (ZYRTEC) 10 MG tablet Take 10 mg by mouth daily.      . Cholecalciferol (VITAMIN D) 2000 UNITS CAPS Take  1 capsule by mouth every morning. Reported on 06/05/2015    . diclofenac sodium (VOLTAREN) 1 % GEL Apply 4 g topically 4 (four) times daily as needed. 400 g 11  . furosemide (LASIX) 40 MG tablet TAKE 1 TABLET DAILY 90 tablet 2  . gabapentin (NEURONTIN) 300 MG capsule TAKE 1 CAPSULE THREE TIMES A DAY 270 capsule 0  . glucose blood (FREESTYLE TEST STRIPS) test strip Use to check blood sugar 2 times daily. 150 each 2  . glucose monitoring kit (FREESTYLE) monitoring kit 1 each by Does not apply route as needed for other. Dispense any model that is covered- dispense testing supplies for Q AC/ HS accuchecks- 1 month supply with one refil. 1 each 1  . insulin lispro (HUMALOG KWIKPEN) 100 UNIT/ML KiwkPen Inject 0.35 mLs (35 Units total) into the skin 3 (three) times daily with meals. And pen needles 3/day 30 mL 11  . INSULIN SYRINGE .5CC/28G 28G X 1/2" 0.5 ML MISC 1 each by Does not apply route 4 (four) times daily -  before meals and at bedtime. 100 each 6  . JANUVIA 100 MG tablet TAKE 1 TABLET DAILY 90 tablet 2  . Lancets (FREESTYLE) lancets Use to check blood sugar two time daily. 150 each 2  . loperamide (IMODIUM) 2 MG capsule Take 2 mg by mouth as needed for diarrhea or loose stools.    . medroxyPROGESTERone (DEPO-PROVERA) 150 MG/ML injection Inject 150 mg into the muscle every 3 (three) months.      . metFORMIN (GLUCOPHAGE-XR) 500 MG 24 hr tablet TAKE 1 TABLET TWICE A DAY 180 tablet 1  . metoprolol (LOPRESSOR) 100 MG tablet TAKE 2 TABLETS DAILY 180 tablet 3  . nortriptyline (PAMELOR) 50 MG capsule Take 100 mg by mouth at bedtime. At bedtime    . omeprazole-sodium bicarbonate (ZEGERID) 40-1100 MG per capsule Take 1 capsule by mouth daily.      . potassium chloride (K-DUR,KLOR-CON) 10 MEQ tablet TAKE 1 TABLET DAILY (KEEP NOVEMBER APPOINTMENT FOR FUTURE REFILLS) 90 tablet 0  . Vitamin D, Ergocalciferol, (DRISDOL) 50000 units CAPS capsule TAKE 1 CAPSULE BY MOUTH EVERY 7 DAYS 12 capsule 0  . rosuvastatin  (CRESTOR) 10 MG tablet Take 1 tablet (10 mg total) by mouth daily. (Patient not taking: Reported on 05/26/2016) 90 tablet 3   No current facility-administered medications on file prior to visit.     BP 120/88 (BP Location: Left Arm, Patient Position: Sitting, Cuff Size: Large)   Pulse 74   Temp 99.1 F (37.3 C) (Oral)   Wt 204 lb 3.2 oz (92.6 kg)   SpO2 99%   BMI  38.58 kg/m       Objective:   Physical Exam  Constitutional: She is oriented to person, place, and time. She appears well-developed and well-nourished.  Eyes: Pupils are equal, round, and reactive to light. No scleral icterus.  Neck: Neck supple.  Cardiovascular: Normal rate and regular rhythm.   Pulmonary/Chest: Effort normal and breath sounds normal. She has no wheezes.  Abdominal: Soft. Bowel sounds are normal. There is no tenderness.  Musculoskeletal: She exhibits no edema.  Spine with normal alignment and no deformity. No tenderness to vertebral process with palpation with the exception of moderate localized tenderness in area of coccyx.  Paraspinous muscles are mildly tender and patient notes this area as painful when transitioning from sitting to standing position. ROM is full at lumbar sacral regions. Straight Leg raise negative. No CVA tenderness present. Able to heel/toe walk without pain.   Lymphadenopathy:    She has no cervical adenopathy.  Neurological: She is alert and oriented to person, place, and time.       Assessment & Plan:  1. Coccyxdynia Pain well localized to coccyx and exacerbated with sitting/leaning back, and moving from sitting to standing position. Patient currently prescribed baclofen TID but only uses it at night but can use more if needed. Advised her to take baclofen as directed by her physician and add acetaminophen for pain relief. Further advised her to use a donut cushion and heat 20 minutes on and 20 minutes off for pain relief. We discussed follow up if symptoms do not improve with  treatment, worsen, or persist; imaging will be considered. Return precautions provided.  2. Fall, initial encounter Mechanical fall due to ice. No cardio/neuroprodrome. No head injury.  Exam and history are reassuring today.  Close follow up recommended with trial of conservative therapy before imaging of coccyx.  Delano Metz, FNP-C

## 2016-06-12 ENCOUNTER — Encounter: Payer: Self-pay | Admitting: Internal Medicine

## 2016-06-17 ENCOUNTER — Other Ambulatory Visit (INDEPENDENT_AMBULATORY_CARE_PROVIDER_SITE_OTHER): Payer: Medicare Other

## 2016-06-17 DIAGNOSIS — E538 Deficiency of other specified B group vitamins: Secondary | ICD-10-CM | POA: Diagnosis not present

## 2016-06-17 LAB — VITAMIN B12: Vitamin B-12: 309 pg/mL (ref 211–911)

## 2016-06-24 ENCOUNTER — Telehealth: Payer: Self-pay | Admitting: Internal Medicine

## 2016-06-24 NOTE — Telephone Encounter (Signed)
Called to let her know disability paper is ready for pick up

## 2016-06-30 ENCOUNTER — Other Ambulatory Visit: Payer: Self-pay | Admitting: Endocrinology

## 2016-06-30 ENCOUNTER — Other Ambulatory Visit: Payer: Self-pay | Admitting: Internal Medicine

## 2016-07-13 ENCOUNTER — Ambulatory Visit (INDEPENDENT_AMBULATORY_CARE_PROVIDER_SITE_OTHER): Payer: Medicare Other

## 2016-07-13 DIAGNOSIS — E538 Deficiency of other specified B group vitamins: Secondary | ICD-10-CM

## 2016-07-13 DIAGNOSIS — Z309 Encounter for contraceptive management, unspecified: Secondary | ICD-10-CM | POA: Diagnosis not present

## 2016-07-13 MED ORDER — CYANOCOBALAMIN 1000 MCG/ML IJ SOLN
1000.0000 ug | Freq: Once | INTRAMUSCULAR | Status: AC
  Administered 2016-07-13: 1000 ug via INTRAMUSCULAR

## 2016-07-13 MED ORDER — MEDROXYPROGESTERONE ACETATE 150 MG/ML IM SUSP
150.0000 mg | Freq: Once | INTRAMUSCULAR | Status: AC
  Administered 2016-07-13: 150 mg via INTRAMUSCULAR

## 2016-07-15 ENCOUNTER — Encounter: Payer: Self-pay | Admitting: Internal Medicine

## 2016-07-18 MED ORDER — FUROSEMIDE 40 MG PO TABS: 40.0000 mg | ORAL_TABLET | Freq: Every day | ORAL | 2 refills | Status: DC

## 2016-08-03 DIAGNOSIS — E119 Type 2 diabetes mellitus without complications: Secondary | ICD-10-CM | POA: Diagnosis not present

## 2016-08-03 DIAGNOSIS — H5213 Myopia, bilateral: Secondary | ICD-10-CM | POA: Diagnosis not present

## 2016-08-03 DIAGNOSIS — H47012 Ischemic optic neuropathy, left eye: Secondary | ICD-10-CM | POA: Diagnosis not present

## 2016-08-03 LAB — HM DIABETES EYE EXAM

## 2016-08-15 ENCOUNTER — Ambulatory Visit (INDEPENDENT_AMBULATORY_CARE_PROVIDER_SITE_OTHER): Payer: Medicare Other

## 2016-08-15 DIAGNOSIS — E538 Deficiency of other specified B group vitamins: Secondary | ICD-10-CM | POA: Diagnosis not present

## 2016-08-15 MED ORDER — CYANOCOBALAMIN 1000 MCG/ML IJ SOLN
1000.0000 ug | Freq: Once | INTRAMUSCULAR | Status: AC
  Administered 2016-08-15: 1000 ug via INTRAMUSCULAR

## 2016-09-26 ENCOUNTER — Encounter: Payer: Self-pay | Admitting: Internal Medicine

## 2016-09-26 DIAGNOSIS — G35 Multiple sclerosis: Secondary | ICD-10-CM

## 2016-09-28 ENCOUNTER — Encounter: Payer: Self-pay | Admitting: Neurology

## 2016-10-04 ENCOUNTER — Other Ambulatory Visit: Payer: Self-pay | Admitting: Internal Medicine

## 2016-10-13 ENCOUNTER — Ambulatory Visit (INDEPENDENT_AMBULATORY_CARE_PROVIDER_SITE_OTHER): Payer: Medicare Other

## 2016-10-13 DIAGNOSIS — Z309 Encounter for contraceptive management, unspecified: Secondary | ICD-10-CM

## 2016-10-13 DIAGNOSIS — E538 Deficiency of other specified B group vitamins: Secondary | ICD-10-CM | POA: Diagnosis not present

## 2016-10-13 MED ORDER — CYANOCOBALAMIN 1000 MCG/ML IJ SOLN
1000.0000 ug | Freq: Once | INTRAMUSCULAR | Status: AC
  Administered 2016-10-13: 1000 ug via INTRAMUSCULAR

## 2016-10-13 MED ORDER — MEDROXYPROGESTERONE ACETATE 150 MG/ML IM SUSP
150.0000 mg | Freq: Once | INTRAMUSCULAR | Status: AC
  Administered 2016-10-13: 150 mg via INTRAMUSCULAR

## 2016-12-06 ENCOUNTER — Encounter: Payer: Self-pay | Admitting: Internal Medicine

## 2016-12-26 ENCOUNTER — Telehealth: Payer: Self-pay | Admitting: Neurology

## 2016-12-26 NOTE — Telephone Encounter (Signed)
Pt called and wanted to know if her medical records were received before her appointment

## 2016-12-26 NOTE — Telephone Encounter (Signed)
Called [facility] [place] [facility], advsd her we rcvd records

## 2017-01-05 ENCOUNTER — Encounter: Payer: Self-pay | Admitting: Neurology

## 2017-01-05 ENCOUNTER — Ambulatory Visit (INDEPENDENT_AMBULATORY_CARE_PROVIDER_SITE_OTHER): Payer: Medicare Other | Admitting: Neurology

## 2017-01-05 ENCOUNTER — Other Ambulatory Visit (INDEPENDENT_AMBULATORY_CARE_PROVIDER_SITE_OTHER): Payer: Medicare Other

## 2017-01-05 VITALS — BP 98/70 | HR 80 | Ht 61.0 in | Wt 203.0 lb

## 2017-01-05 DIAGNOSIS — G35 Multiple sclerosis: Secondary | ICD-10-CM

## 2017-01-05 DIAGNOSIS — G4733 Obstructive sleep apnea (adult) (pediatric): Secondary | ICD-10-CM

## 2017-01-05 LAB — CBC WITH DIFFERENTIAL/PLATELET
BASOS PCT: 0.7 %
Basophils Absolute: 56 cells/uL (ref 0–200)
EOS ABS: 392 {cells}/uL (ref 15–500)
Eosinophils Relative: 4.9 %
HCT: 35.4 % (ref 35.0–45.0)
Hemoglobin: 11.6 g/dL — ABNORMAL LOW (ref 11.7–15.5)
Lymphs Abs: 1728 cells/uL (ref 850–3900)
MCH: 25.8 pg — AB (ref 27.0–33.0)
MCHC: 32.8 g/dL (ref 32.0–36.0)
MCV: 78.8 fL — AB (ref 80.0–100.0)
MPV: 9.9 fL (ref 7.5–12.5)
Monocytes Relative: 4.9 %
NEUTROS PCT: 67.9 %
Neutro Abs: 5432 cells/uL (ref 1500–7800)
Platelets: 310 10*3/uL (ref 140–400)
RBC: 4.49 10*6/uL (ref 3.80–5.10)
RDW: 15.9 % — AB (ref 11.0–15.0)
TOTAL LYMPHOCYTE: 21.6 %
WBC: 8 10*3/uL (ref 3.8–10.8)
WBCMIX: 392 {cells}/uL (ref 200–950)

## 2017-01-05 LAB — HEPATIC FUNCTION PANEL
AG Ratio: 1.5 (calc) (ref 1.0–2.5)
ALT: 15 U/L (ref 6–29)
AST: 15 U/L (ref 10–30)
Albumin: 4.1 g/dL (ref 3.6–5.1)
Alkaline phosphatase (APISO): 74 U/L (ref 33–115)
BILIRUBIN DIRECT: 0.1 mg/dL (ref 0.0–0.2)
BILIRUBIN INDIRECT: 0.4 mg/dL (ref 0.2–1.2)
GLOBULIN: 2.7 g/dL (ref 1.9–3.7)
Total Bilirubin: 0.5 mg/dL (ref 0.2–1.2)
Total Protein: 6.8 g/dL (ref 6.1–8.1)

## 2017-01-05 LAB — VITAMIN D 25 HYDROXY (VIT D DEFICIENCY, FRACTURES): VITD: 24.16 ng/mL — AB (ref 30.00–100.00)

## 2017-01-05 MED ORDER — TRAMADOL HCL 50 MG PO TABS: 50.0000 mg | ORAL_TABLET | Freq: Four times a day (QID) | ORAL | 2 refills | Status: DC | PRN

## 2017-01-05 NOTE — Patient Instructions (Signed)
1.  We will start you on Aubagio  Check CBC with diff, hepatic panel, pregnancy test, TB, vitamin D  After starting Aubagio, we will recheck hepatic panel every month for 6 months, then every 6 months  We will repeat CBC with diff every 6 months 2.  Continue gabapentin 300mg  three times daily 3.  Continue D3 2000 IU daily 4.  Will prescribe tramadol for breakthrough nerve pain 5.  Check MRI of brain and cervical spine with and without contrast 6.  Follow up in 6 months after starting Aubagio

## 2017-01-05 NOTE — Progress Notes (Signed)
NEUROLOGY CONSULTATION NOTE  [name] MRN: [id number] DOB: Jun 26, 1975  Referring provider: Dr. Jenny Reichmann Primary care provider: Dr. Jenny Reichmann  Reason for consult:  Multiple sclerosis  HISTORY OF PRESENT ILLNESS: Courtney [name] is a 41 year old right-handed Caucasian female who presents to establish care for relapsing-remitting multiple sclerosis.  History supplemented by prior neurologist's notes.  Onset of symptoms and diagnosis occurred in 2006.  She experienced an episode of numbness in her right arm and both lower extremities.  She was dropping objects.  MRI of brain reportedly showed "solitary right mid-to-posterior frontal periventricular white matter lesion".  MRI of spine revealed no cord demyelination.  CSF analysis from lumbar puncture reportedly revealed oligoclonal bands and an elevated IgG index.  She was diagnosed with clinically isolated syndrome but was officially diagnosed with multiple sclerosis a few months later when she developed a relapse with left hand numbness.  In February 2013, she developed left arm pain and swelling.  MRI of cervical spine revealed an enhancing spinal cord lesion at C4 on the right.  She was treated with IV Solumedrol.  In 2014, she had an episode of optic neuritis in her right eye, treated with IV Solumedrol  Chronic symptoms: Occasional pain and tightness in her lower extremities Residual right upper and lower extremity weakness Fatigue.  She has diagnosis of OSA, which is untreated. Occasional bowel and bladder dysfunction but has not yet been incontinent. Memory:  She sometimes has trouble remembering new information.  There is no family history of MS.  Current disease modifying therapy:  She has been off of therapy for 2 years. Past disease modifying therapy:  Avonex (difficulty injecting), Rebif (painful injections), Tecfidera (GI side effects)  Imaging: 06/03/11:  MRI brain with and without contrast:  single right frontal periventricular  white matter lesion with high T2 FLAIR signal 06/03/11:  MRI cervical spine with and without contrast:  abnormal rim enhancing lesion in the spinal cord at the level of C4 on the right which mildly expands the cord. 01/21/12:  MRI brain with and without contrast:  unchanged nonspecific right periventricular white matter lesion 01/21/12: MRI cervical spine with and without contrast: stable nonenhancing cord lesion at C4 11/14/12: MRI brain with and without contrast.  No change 11/14/12:  MRI cervical spine with and without contrast:  No change 12/02/13: MRI brain with and without contrast:  1. No substantial change in the right frontal juxtacortical, right corona radiata, and left periatrial white matter T2/FLAIR hyperintensities. While nonspecific, these likely reflect demyelinating lesions in this patient with a history of multiple sclerosis and concomitant cervical cord lesion. No new lesions are identified and none of the existing lesions enhance or restrict diffusion.  2. Subcentimeter left parafalcine dural based lesion as described above has slowly enlarged over time and demonstrates equivocal enhancement, but may represent a meningioma.  Recommend attention on follow up imaging. 12/02/13:  MRI cervical spine with and without contrast:  No change 12/15/14:  MRI brain with and without contrast:  1.  Similar appearance of focal T2/FLAIR hyperintense white matter lesion within the right lateral body of the corpus callosum without associated enhancement to suggest active demyelination.  2.  No new or enlarging white matter lesions identified.  3.  Slowly enlarging nonenhancing nodular lesion along the left anterior aspect of the falx cerebri, which may reflect a small meningioma. 12/15/14:  MRI cervical spine with and without contrast:  no change 10/10/15:  MRI brain with and without contrast:  no change  10/10/15:  MRI cervical spine with and without contrast:  no change  PAST MEDICAL HISTORY: Past Medical  History:  Diagnosis Date  . ALLERGIC RHINITIS 03/13/2007  . Allergy   . Anemia, B12 deficiency   . Anxiety   . C. difficile colitis   . CARPAL TUNNEL SYNDROME, BILATERAL 01/13/2010  . Cervical lesion 07/13/2011   Spine lesion - indeterminate, for f/u MRI may 2013 per neurology  . Chronic fatigue   . CTS (carpal tunnel syndrome)   . Diabetes mellitus without complication ([place])   . ESSENTIAL HYPERTENSION 03/13/2007  . Fibromyalgia   . GERD (gastroesophageal reflux disease)   . Heart murmur   . Hx of adenomatous polyp of colon 06/09/2009  . Hyperlipidemia   . HYPERLIPIDEMIA 03/13/2007  . Irritable bowel syndrome 05/24/2010  . Multiple sclerosis ([place])   . OBSTRUCTIVE SLEEP APNEA 12/08/2008  . Sleep apnea    uses c pap  . VITAMIN D DEFICIENCY 01/13/2010    PAST SURGICAL HISTORY: Past Surgical History:  Procedure Laterality Date  . APPENDECTOMY  07/23/12  . CARPAL TUNNEL RELEASE  01/2010   bilateral  . COLONOSCOPY W/ BIOPSIES AND POLYPECTOMY  05/2009   2 tubular adenomas  . LAPAROSCOPIC APPENDECTOMY N/A 07/23/2012   Procedure: APPENDECTOMY LAPAROSCOPIC;  Surgeon: Gayland Curry, MD;  Location: [facility];  Service: General;  Laterality: N/A;    MEDICATIONS: Current Outpatient Prescriptions on File Prior to Visit  Medication Sig Dispense Refill  . baclofen (LIORESAL) 10 MG tablet Take 10 mg by mouth daily at 2 PM daily at 2 PM.    . cetirizine (ZYRTEC) 10 MG tablet Take 10 mg by mouth daily.      . Cholecalciferol (VITAMIN D) 2000 UNITS CAPS Take 1 capsule by mouth every morning. Reported on 06/05/2015    . furosemide (LASIX) 40 MG tablet Take 1 tablet (40 mg total) by mouth daily. 90 tablet 2  . gabapentin (NEURONTIN) 300 MG capsule TAKE 1 CAPSULE THREE TIMES A DAY 270 capsule 0  . insulin lispro (HUMALOG KWIKPEN) 100 UNIT/ML KiwkPen Inject 0.35 mLs (35 Units total) into the skin 3 (three) times daily with meals. And pen needles 3/day 30 mL 11  . JANUVIA 100 MG tablet TAKE 1 TABLET DAILY 90  tablet 2  . loperamide (IMODIUM) 2 MG capsule Take 2 mg by mouth as needed for diarrhea or loose stools.    . medroxyPROGESTERone (DEPO-PROVERA) 150 MG/ML injection Inject 150 mg into the muscle every 3 (three) months.      . metFORMIN (GLUCOPHAGE-XR) 500 MG 24 hr tablet TAKE 1 TABLET TWICE A DAY 180 tablet 1  . metoprolol (LOPRESSOR) 100 MG tablet TAKE 2 TABLETS DAILY 180 tablet 3  . nortriptyline (PAMELOR) 50 MG capsule Take 100 mg by mouth at bedtime. At bedtime    . omeprazole-sodium bicarbonate (ZEGERID) 40-1100 MG per capsule Take 1 capsule by mouth daily.      . potassium chloride (K-DUR,KLOR-CON) 10 MEQ tablet Take 1 tablet (10 mEq total) by mouth daily. 90 tablet 2  . glucose blood (FREESTYLE TEST STRIPS) test strip Use to check blood sugar 2 times daily. (Patient not taking: Reported on 01/05/2017) 150 each 2  . glucose monitoring kit (FREESTYLE) monitoring kit 1 each by Does not apply route as needed for other. Dispense any model that is covered- dispense testing supplies for Q AC/ HS accuchecks- 1 month supply with one refil. (Patient not taking: Reported on 01/05/2017) 1 each 1  . INSULIN SYRINGE .  5CC/28G 28G X 1/2" 0.5 ML MISC 1 each by Does not apply route 4 (four) times daily -  before meals and at bedtime. (Patient not taking: Reported on 01/05/2017) 100 each 6  . Lancets (FREESTYLE) lancets Use to check blood sugar two time daily. (Patient not taking: Reported on 01/05/2017) 150 each 2   No current facility-administered medications on file prior to visit.     ALLERGIES: Allergies  Allergen Reactions  . Ciprofloxacin In D5w Rash  . Penicillins     REACTION: rash  . Tricor [Fenofibrate] Rash    FAMILY HISTORY: Family History  Problem Relation Age of Onset  . Migraines Mother   . Hypertension Father   . COPD Father   . Diabetes Father   . Prostate cancer Maternal Uncle        prostate cancer  . Lung cancer Maternal Grandmother   . Stroke Maternal Grandfather   . Heart  attack Maternal Grandfather   . Diabetes Other   . Hypertension Other   . Colon cancer Neg Hx   . Esophageal cancer Neg Hx   . Rectal cancer Neg Hx   . Stomach cancer Neg Hx     SOCIAL HISTORY: Social History   Social History  . Marital status: Married    Spouse name: N/A  . Number of children: 2  . Years of education: N/A   Occupational History  . disabled since 2009 MS Unemployed   Social History Main Topics  . Smoking status: Never Smoker  . Smokeless tobacco: Never Used  . Alcohol use No  . Drug use: No  . Sexual activity: Not on file   Other Topics Concern  . Not on file   Social History Narrative   Daily Caffeine    REVIEW OF SYSTEMS: Constitutional: No fevers, chills, or sweats, no generalized fatigue, change in appetite Eyes: No visual changes, double vision, eye pain Ear, nose and throat: No hearing loss, ear pain, nasal congestion, sore throat Cardiovascular: No chest pain, palpitations Respiratory:  No shortness of breath at rest or with exertion, wheezes GastrointestinaI: No nausea, vomiting, diarrhea, abdominal pain, fecal incontinence Genitourinary:  No dysuria, urinary retention or frequency Musculoskeletal:  No neck pain, back pain Integumentary: No rash, pruritus, skin lesions Neurological: as above Psychiatric: No depression, insomnia, anxiety Endocrine: No palpitations, fatigue, diaphoresis, mood swings, change in appetite, change in weight, increased thirst Hematologic/Lymphatic:  No purpura, petechiae. Allergic/Immunologic: no itchy/runny eyes, nasal congestion, recent allergic reactions, rashes  PHYSICAL EXAM: Vitals:   01/05/17 1255  BP: 98/70  Pulse: 80  SpO2: 98%   General: No acute distress.  Patient appears well-groomed.  Head:  Normocephalic/atraumatic Eyes:  fundi examined but not visualized Neck: supple, no paraspinal tenderness, full range of motion Back: No paraspinal tenderness Heart: regular rate and rhythm Lungs: Clear  to auscultation bilaterally. Vascular: No carotid bruits. Neurological Exam: Mental status: alert and oriented to person, place, and time, recent and remote memory intact, fund of knowledge intact, attention and concentration intact, speech fluent and not dysarthric, language intact. Cranial nerves: CN I: not tested CN II: pupils equal, round and reactive to light, visual fields intact CN III, IV, VI:  full range of motion, no nystagmus, no ptosis CN V: facial sensation intact CN VII: upper and lower face symmetric CN VIII: hearing intact CN IX, X: gag intact, uvula midline CN XI: sternocleidomastoid and trapezius muscles intact CN XII: tongue midline Bulk & Tone: mildly increased in lower extremities, no fasciculations. Motor:  5-/5 right deltoid, 4/5 right triceps/biceps/grip/lower extremity; 5-/5 left triceps but otherwise 5/5. Sensation: temperature and vibration sensation intact. Deep Tendon Reflexes:  2+ throughout, toes downgoing.  Finger to nose testing:  Without dysmetria.   Heel to shin:  Without dysmetria.   Gait:  Normal station with slow stride.  Able to turn and tandem walk. Timed 25 foot walk 7.28 seconds.  Romberg negative.  IMPRESSION: 1.  Relapsing-remitting multiple sclerosis 2.  Obstructive Sleep Apnea  Even though MRI and clinical presentation has been stable, I recommend disease modifying therapy.  We discussed several options (side effects and benefits) including Gilenya, Aubagio and Ocrevus.  She would like to start Aubagio.  PLAN: 1.  We will start you on Aubagio  Check CBC with diff, hepatic panel, pregnancy test, TB, vitamin D  After starting Aubagio, we will recheck hepatic panel every month for 6 months, then every 6 months  We will repeat CBC with diff every 6 months 2.  Continue gabapentin 333m three times daily 3.  Continue D3 2000 IU daily 4.  Will prescribe tramadol for breakthrough nerve pain 5.  Check MRI of brain and cervical spine with and  without contrast 6.  She experiences fatigue and has untreated OSA.  Will refer to sleep medicine. 7.  Follow up in 6 months after starting Aubagio  Thank you for allowing me to take part in the care of this patient.  AMetta Clines DO  CC:  JCathlean Cower MD

## 2017-01-07 LAB — QUANTIFERON TB GOLD ASSAY (BLOOD)
MITOGEN-NIL SO: 4.95 [IU]/mL
QUANTIFERON(R)-TB GOLD: NEGATIVE
Quantiferon Nil Value: 0.02 IU/mL
Quantiferon Tb Ag Minus Nil Value: 0.01 IU/mL

## 2017-01-07 LAB — PREGNANCY, URINE: Preg Test, Ur: NEGATIVE

## 2017-01-07 LAB — EXTRA URINE SPECIMEN

## 2017-01-09 ENCOUNTER — Telehealth: Payer: Self-pay

## 2017-01-09 NOTE — Telephone Encounter (Signed)
Spoke with Pt, she is aware to increase D3 frpm 2000 IU to 4000 IU

## 2017-01-09 NOTE — Telephone Encounter (Signed)
-----   Message from Pieter Partridge, DO sent at 01/06/2017  6:48 AM EDT ----- Vitamin D level is still low.  I would like her to increase the D3 from 2000 IU daily to 4000 IU daily. Otherwise, her labs are unremarkable.

## 2017-01-13 ENCOUNTER — Ambulatory Visit (INDEPENDENT_AMBULATORY_CARE_PROVIDER_SITE_OTHER): Payer: Medicare Other

## 2017-01-13 ENCOUNTER — Telehealth: Payer: Self-pay | Admitting: Neurology

## 2017-01-13 DIAGNOSIS — Z23 Encounter for immunization: Secondary | ICD-10-CM

## 2017-01-13 DIAGNOSIS — E538 Deficiency of other specified B group vitamins: Secondary | ICD-10-CM | POA: Diagnosis not present

## 2017-01-13 DIAGNOSIS — Z309 Encounter for contraceptive management, unspecified: Secondary | ICD-10-CM | POA: Diagnosis not present

## 2017-01-13 MED ORDER — MEDROXYPROGESTERONE ACETATE 150 MG/ML IM SUSP
150.0000 mg | Freq: Once | INTRAMUSCULAR | Status: AC
  Administered 2017-01-13: 150 mg via INTRAMUSCULAR

## 2017-01-13 MED ORDER — CYANOCOBALAMIN 1000 MCG/ML IJ SOLN
1000.0000 ug | Freq: Once | INTRAMUSCULAR | Status: AC
  Administered 2017-01-13: 1000 ug via INTRAMUSCULAR

## 2017-01-13 NOTE — Telephone Encounter (Signed)
[facility] left a message that pt's medication needs a prior auth CB# [phone]

## 2017-01-16 NOTE — Telephone Encounter (Signed)
[facility] called again.Marland Kitchen

## 2017-01-18 NOTE — Telephone Encounter (Signed)
PA initiated online on covermymeds

## 2017-01-19 ENCOUNTER — Encounter: Payer: Self-pay | Admitting: Internal Medicine

## 2017-01-19 ENCOUNTER — Telehealth: Payer: Self-pay | Admitting: Neurology

## 2017-01-19 ENCOUNTER — Ambulatory Visit (INDEPENDENT_AMBULATORY_CARE_PROVIDER_SITE_OTHER): Payer: Medicare Other | Admitting: Internal Medicine

## 2017-01-19 VITALS — BP 134/82 | HR 71 | Ht 61.0 in | Wt 203.0 lb

## 2017-01-19 DIAGNOSIS — G4733 Obstructive sleep apnea (adult) (pediatric): Secondary | ICD-10-CM | POA: Diagnosis not present

## 2017-01-19 DIAGNOSIS — R5383 Other fatigue: Secondary | ICD-10-CM

## 2017-01-19 NOTE — Progress Notes (Signed)
01/19/17-41 year old female never smoker for sleep evaluation. Referred by Dr. Tomi Likens for sleep consult.  Pt states she was dx'ed with sleep apnea "years ago", does not currently wear cpap.  pt c/o worsening daytime sleepiness. Epworth score: 16.  NPSG 11/29/08- AHI 4/ hr, RDI 10/ hr, desaturation to 90%,  Medical problem list includes allergic rhinitis, anxiety, IBS, DM 2, HBP, GERD, MS She complains of daytime sleepiness especially on long drives, reported loud snoring and witnessed apneas. She sleeps alone without current reporter. No sleep medications. Caffeine described as 3 Dr. Samson Frederic per day No parasomnias. Naps most days at lunchtime. Bedtime around 11 PM with short sleep latency, waking 3 or 4 times before up at 6:30. Epworth score 16/24  Prior to Admission medications   Medication Sig Start Date End Date Taking? Authorizing Provider  atorvastatin (LIPITOR) 20 MG tablet Take 20 mg by mouth daily. 10/31/16  Yes [provider]  baclofen (LIORESAL) 10 MG tablet Take 10 mg by mouth daily at 2 PM daily at 2 PM.   Yes [provider]  cetirizine (ZYRTEC) 10 MG tablet Take 10 mg by mouth daily.     Yes [provider]  Cholecalciferol (VITAMIN D) 2000 UNITS CAPS Take 2 capsules by mouth every morning. Reported on 06/05/2015   Yes [provider]  cyanocobalamin (,VITAMIN B-12,) 1000 MCG/ML injection Inject into the muscle every 30 (thirty) days.   Yes [provider]  dalfampridine (AMPYRA) 10 MG TB12 Take 10 mg by mouth 2 (two) times daily. 05/04/16  Yes [provider]  furosemide (LASIX) 40 MG tablet Take 1 tablet (40 mg total) by mouth daily. 07/18/16  Yes Biagio Borg, MD  gabapentin (NEURONTIN) 300 MG capsule TAKE 1 CAPSULE THREE TIMES A DAY 03/22/16  Yes Biagio Borg, MD  glucose blood (FREESTYLE TEST STRIPS) test strip Use to check blood sugar 2 times daily. 09/30/15  Yes Renato Shin, MD  glucose monitoring kit (FREESTYLE) monitoring kit  1 each by Does not apply route as needed for other. Dispense any model that is covered- dispense testing supplies for Q AC/ HS accuchecks- 1 month supply with one refil. 09/30/15  Yes Renato Shin, MD  insulin lispro (HUMALOG KWIKPEN) 100 UNIT/ML KiwkPen Inject 0.35 mLs (35 Units total) into the skin 3 (three) times daily with meals. And pen needles 3/day 02/17/16  Yes Biagio Borg, MD  INSULIN SYRINGE .5CC/28G 28G X 1/2" 0.5 ML MISC 1 each by Does not apply route 4 (four) times daily -  before meals and at bedtime. 08/12/14  Yes Debbe Odea, MD  JANUVIA 100 MG tablet TAKE 1 TABLET DAILY 06/30/16  Yes Renato Shin, MD  Lancets (FREESTYLE) lancets Use to check blood sugar two time daily. 09/28/15  Yes Renato Shin, MD  loperamide (IMODIUM) 2 MG capsule Take 2 mg by mouth as needed for diarrhea or loose stools.   Yes [provider]  medroxyPROGESTERone (DEPO-PROVERA) 150 MG/ML injection Inject 150 mg into the muscle every 3 (three) months.     Yes [provider]  metFORMIN (GLUCOPHAGE-XR) 500 MG 24 hr tablet TAKE 1 TABLET TWICE A DAY 10/04/16  Yes Biagio Borg, MD  metoprolol (LOPRESSOR) 100 MG tablet TAKE 2 TABLETS DAILY 03/22/16  Yes Biagio Borg, MD  nortriptyline (PAMELOR) 50 MG capsule Take 100 mg by mouth at bedtime. At bedtime 04/01/11  Yes Biagio Borg, MD  omeprazole-sodium bicarbonate (ZEGERID) 40-1100 MG per capsule Take 1 capsule by mouth daily.  Yes [provider]  potassium chloride (K-DUR,KLOR-CON) 10 MEQ tablet Take 1 tablet (10 mEq total) by mouth daily. 06/30/16  Yes Biagio Borg, MD  traMADol (ULTRAM) 50 MG tablet Take 1 tablet (50 mg total) by mouth every 6 (six) hours as needed. 01/05/17  Yes Pieter Partridge, DO   Past Medical History:  Diagnosis Date  . ALLERGIC RHINITIS 03/13/2007  . Allergy   . Anemia, B12 deficiency   . Anxiety   . C. difficile colitis   . CARPAL TUNNEL SYNDROME, BILATERAL 01/13/2010  . Cervical lesion 07/13/2011   Spine  lesion - indeterminate, for f/u MRI may 2013 per neurology  . Chronic fatigue   . CTS (carpal tunnel syndrome)   . Diabetes mellitus without complication ([place])   . ESSENTIAL HYPERTENSION 03/13/2007  . Fibromyalgia   . GERD (gastroesophageal reflux disease)   . Heart murmur   . Hx of adenomatous polyp of colon 06/09/2009  . Hyperlipidemia   . HYPERLIPIDEMIA 03/13/2007  . Irritable bowel syndrome 05/24/2010  . Multiple sclerosis ([place])   . OBSTRUCTIVE SLEEP APNEA 12/08/2008  . Sleep apnea    uses c pap  . VITAMIN D DEFICIENCY 01/13/2010   Past Surgical History:  Procedure Laterality Date  . APPENDECTOMY  07/23/12  . CARPAL TUNNEL RELEASE  01/2010   bilateral  . COLONOSCOPY W/ BIOPSIES AND POLYPECTOMY  05/2009   2 tubular adenomas  . LAPAROSCOPIC APPENDECTOMY N/A 07/23/2012   Procedure: APPENDECTOMY LAPAROSCOPIC;  Surgeon: Gayland Curry, MD;  [facility];  Service: General;  Laterality: N/A;   Family History  Problem Relation Age of Onset  . Migraines Mother   . Hypertension Father   . COPD Father   . Diabetes Father   . Prostate cancer Maternal Uncle        prostate cancer  . Lung cancer Maternal Grandmother   . Stroke Maternal Grandfather   . Heart attack Maternal Grandfather   . Diabetes Other   . Hypertension Other   . Asthma Daughter   . Colon cancer Neg Hx   . Esophageal cancer Neg Hx   . Rectal cancer Neg Hx   . Stomach cancer Neg Hx    Social History   Social History  . Marital status: Married    Spouse name: N/A  . Number of children: 2  . Years of education: N/A   Occupational History  . disabled since 2009 MS Unemployed   Social History Main Topics  . Smoking status: Never Smoker  . Smokeless tobacco: Never Used  . Alcohol use No  . Drug use: No  . Sexual activity: Not on file   Other Topics Concern  . Not on file   Social History Narrative   Daily Caffeine   ROS-see HPI   + = Positive Constitutional:    weight loss, night sweats, fevers,  chills, fatigue, lassitude. HEENT:    headaches, difficulty swallowing, tooth/dental problems, sore throat,       sneezing, itching, ear ache, nasal congestion, post nasal drip, snoring CV:    chest pain, orthopnea, PND, swelling in lower extremities, anasarca,                                                            dizziness, palpitations Resp:  shortness of breath with exertion or at rest.                productive cough,   non-productive cough, coughing up of blood.              change in color of mucus.  wheezing.   Skin:    rash or lesions. GI:  No-   heartburn, indigestion, abdominal pain, nausea, vomiting, diarrhea,                 change in bowel habits, loss of appetite GU: dysuria, change in color of urine, no urgency or frequency.   flank pain. MS:   joint pain, stiffness, decreased range of motion, back pain. Neuro-     nothing unusual Psych:  change in mood or affect.  depression or anxiety.   memory loss.  OBJ- Physical Exam General- Alert, Oriented, Affect-appropriate, Distress- none acute Skin- rash-none, lesions- none, excoriation- none Lymphadenopathy- none Head- atraumatic            Eyes- Gross vision intact, PERRLA, conjunctivae and secretions clear            Ears- Hearing, canals-normal            Nose- Clear, no-Septal dev, mucus, polyps, erosion, perforation             Throat- Mallampati II , mucosa clear , drainage- none, tonsils- atrophic Neck- flexible , trachea midline, no stridor , thyroid nl, carotid no bruit Chest - symmetrical excursion , unlabored           Heart/CV- RRR , no murmur , no gallop  , no rub, nl s1 s2                           - JVD- none , edema- none, stasis changes- none, varices- none           Lung- clear to P&A, wheeze- none, cough- none , dullness-none, rub- none           Chest wall-  Abd-  Br/ Gen/ Rectal- Not done, not indicated Extrem- cyanosis- none, clubbing, none, atrophy- none, strength- nl Neuro- grossly intact to  observation

## 2017-01-19 NOTE — Patient Instructions (Signed)
Order- please schedule unattended home sleep test     Dx OSA   Please call me for results and recommendations about 2 weeks after your sleep test

## 2017-01-19 NOTE — Telephone Encounter (Signed)
[facility] uses [facility]. Needing a Prior Auth for the medication Auvagio? Please Advise. Thanks Fax # [phone]/ [phone]

## 2017-01-19 NOTE — Assessment & Plan Note (Signed)
Not clear if she has a significant sleep disorder. This symptom may reflect her MS

## 2017-01-19 NOTE — Assessment & Plan Note (Signed)
We might not have accepted results of her original sleep study as diagnostic of obstructive sleep apnea syndrome by current standards. She deserves reassessment. Her primary complaint is excessive daytime sleepiness without cataplexy or hypnagogic hallucination reported. Plan-sleep study

## 2017-01-27 ENCOUNTER — Other Ambulatory Visit: Payer: Medicare Other

## 2017-01-27 ENCOUNTER — Telehealth: Payer: Self-pay | Admitting: Neurology

## 2017-01-27 NOTE — Telephone Encounter (Signed)
This was already sent on 01/19/17. Please address need for prior authorization.

## 2017-01-27 NOTE — Telephone Encounter (Signed)
[facility] called in regards to a prior authorization for medication Aubagio for pt CB# [phone]

## 2017-01-30 NOTE — Telephone Encounter (Signed)
Called [facility], spoke with Clair Gulling, he states there has not been any information rcvd from our office, I advsd him it was faxed again on 01/26/17-I asked to speak with a PA rep-spoke with Samella Parr, gave her all info. Rogelia Rohrer is approved until 01/30/18 case [id number]. Called Pt to advise her, LM on VM

## 2017-02-03 ENCOUNTER — Other Ambulatory Visit: Payer: Self-pay | Admitting: Internal Medicine

## 2017-02-07 ENCOUNTER — Ambulatory Visit
Admission: RE | Admit: 2017-02-07 | Discharge: 2017-02-07 | Disposition: A | Discharge status: Home / Self Care | Payer: Medicare Other | Source: Ambulatory Visit | Attending: Neurology | Admitting: Neurology

## 2017-02-07 DIAGNOSIS — G35 Multiple sclerosis: Secondary | ICD-10-CM | POA: Diagnosis not present

## 2017-02-07 DIAGNOSIS — M50222 Other cervical disc displacement at C5-C6 level: Secondary | ICD-10-CM | POA: Diagnosis not present

## 2017-02-07 MED ORDER — GADOBENATE DIMEGLUMINE 529 MG/ML IV SOLN
20.0000 mL | Freq: Once | INTRAVENOUS | Status: AC | PRN
  Administered 2017-02-07: 20 mL via INTRAVENOUS

## 2017-02-08 ENCOUNTER — Telehealth: Payer: Self-pay

## 2017-02-08 NOTE — Telephone Encounter (Signed)
-----   Message from Pieter Partridge, DO sent at 02/08/2017  6:57 AM EDT ----- MRIs are stable.  No change since prior MRI, which is good.

## 2017-02-08 NOTE — Telephone Encounter (Signed)
Called Pt, it rang several times, then disconnected, will try again later

## 2017-02-09 ENCOUNTER — Telehealth: Payer: Self-pay

## 2017-02-09 NOTE — Telephone Encounter (Signed)
-----   Message from Pieter Partridge, DO sent at 02/08/2017  6:57 AM EDT ----- MRIs are stable.  No change since prior MRI, which is good.

## 2017-02-09 NOTE — Telephone Encounter (Signed)
Called Pt, a female ansered and said hello, I introduced myself and asked for Courtney [name], the person did not answer for 15-20 seconds then disconnected, will try again later

## 2017-02-10 DIAGNOSIS — G4733 Obstructive sleep apnea (adult) (pediatric): Secondary | ICD-10-CM | POA: Diagnosis not present

## 2017-02-10 DIAGNOSIS — R0683 Snoring: Secondary | ICD-10-CM | POA: Diagnosis not present

## 2017-02-13 ENCOUNTER — Ambulatory Visit (INDEPENDENT_AMBULATORY_CARE_PROVIDER_SITE_OTHER): Payer: Medicare Other

## 2017-02-13 DIAGNOSIS — E538 Deficiency of other specified B group vitamins: Secondary | ICD-10-CM | POA: Diagnosis not present

## 2017-02-13 MED ORDER — CYANOCOBALAMIN 1000 MCG/ML IJ SOLN
1000.0000 ug | Freq: Once | INTRAMUSCULAR | Status: AC
  Administered 2017-02-13: 1000 ug via INTRAMUSCULAR

## 2017-02-13 NOTE — Progress Notes (Signed)
Medical screening examination/treatment/procedure(s) were performed by non-physician practitioner and as supervising physician I was immediately available for consultation/collaboration. I agree with above. James John, MD   

## 2017-02-15 DIAGNOSIS — R0683 Snoring: Secondary | ICD-10-CM | POA: Diagnosis not present

## 2017-02-16 ENCOUNTER — Telehealth: Payer: Self-pay

## 2017-02-16 NOTE — Telephone Encounter (Signed)
-----   Message from Pieter Partridge, DO sent at 02/08/2017  6:57 AM EDT ----- MRIs are stable.  No change since prior MRI, which is good.

## 2017-02-16 NOTE — Telephone Encounter (Signed)
LM on Pts VM advising of MRI results

## 2017-02-20 ENCOUNTER — Telehealth: Payer: Self-pay | Admitting: Neurology

## 2017-02-20 DIAGNOSIS — Z79899 Other long term (current) drug therapy: Secondary | ICD-10-CM

## 2017-02-20 NOTE — Telephone Encounter (Signed)
She started the Aubagio today. She wanted you to know so labs could be checked in 6 weeks. Please Call. Thanks

## 2017-02-20 NOTE — Telephone Encounter (Signed)
Called and talked with Pt, set lab appt for 03/23/17, reminded her also she will need once a month for 6 months

## 2017-02-23 ENCOUNTER — Other Ambulatory Visit: Payer: Self-pay | Admitting: *Deleted

## 2017-02-23 DIAGNOSIS — G4733 Obstructive sleep apnea (adult) (pediatric): Secondary | ICD-10-CM

## 2017-03-13 ENCOUNTER — Telehealth: Payer: Self-pay | Admitting: Endocrinology

## 2017-03-13 NOTE — Telephone Encounter (Signed)
Called patient to try to find out if she knew of any reason blood sugars would be higher. I left her a VM to call back for more info. I stated I would route you the message regardless.

## 2017-03-13 NOTE — Telephone Encounter (Signed)
B/s is running 398 last night supper, this mor 325,  2:45 today it was 260, please advise on what to do.

## 2017-03-13 NOTE — Telephone Encounter (Signed)
Please sched f/u appt this week.

## 2017-03-14 NOTE — Telephone Encounter (Signed)
Patient made appt tomorrow 2:30.

## 2017-03-15 ENCOUNTER — Encounter: Payer: Self-pay | Admitting: Endocrinology

## 2017-03-15 ENCOUNTER — Ambulatory Visit (INDEPENDENT_AMBULATORY_CARE_PROVIDER_SITE_OTHER): Payer: Medicare Other | Admitting: Endocrinology

## 2017-03-15 VITALS — BP 123/89 | HR 105 | Wt 195.4 lb

## 2017-03-15 DIAGNOSIS — Z794 Long term (current) use of insulin: Secondary | ICD-10-CM

## 2017-03-15 DIAGNOSIS — E119 Type 2 diabetes mellitus without complications: Secondary | ICD-10-CM

## 2017-03-15 LAB — POCT GLYCOSYLATED HEMOGLOBIN (HGB A1C): HEMOGLOBIN A1C: 9.5

## 2017-03-15 MED ORDER — INSULIN LISPRO 100 UNIT/ML (KWIKPEN): 35.0000 [IU] | PEN_INJECTOR | Freq: Three times a day (TID) | SUBCUTANEOUS | 3 refills | Status: DC

## 2017-03-15 MED ORDER — CLOTRIMAZOLE 10 MG MT TROC: 10.0000 mg | Freq: Every day | OROMUCOSAL | 2 refills | Status: DC

## 2017-03-15 NOTE — Patient Instructions (Addendum)
check your blood sugar twice a day.  vary the time of day when you check, between before the 3 meals, and at bedtime.  also check if you have symptoms of your blood sugar being too high or too low.  please keep a record of the readings and bring it to your next appointment here.  You can write it on any piece of paper.   i have sent a prescription to your pharmacy, to resume the insulin.  You would take this just before you eat, and only when you eat.  Please increase the insulin to 35 units 3 times a day (just before each meal).   Please call or message [name] next week, to tell us how the blood sugar is doing.  I have sent a prescription to your pharmacy, for a lozenge for the red tongue Please come back for a follow-up appointment in 2 months.

## 2017-03-15 NOTE — Progress Notes (Signed)
Subjective:    Patient ID: Courtney [name], female    DOB: March 11, 1976, 41 y.o.   MRN: [id number]  HPI Pt returns for f/u of diabetes mellitus: DM type: Insulin-requiring type 2 Dx'ed: 2016, when she presented with nonketotic hyperosmolar hyperglycemic state, but not DKA Complications: none Therapy: insulin since 2018, and 2 oral meds. GDM: never. DKA: never. Severe hypoglycemia: never.  Pancreatitis: never.   Other: she took lantus for a brief time in early 2016; metformin dosage is limited by nausea.   She declines weight loss surgery Interval history:  she brings a record of her cbg's which i have reviewed today.  It varies from 200-400.  There is no trend throughout the day.  She says she never misses the insulin.  She takes 22 units 3 times a day (just before each meal).  She has polyuria.   Past Medical History:  Diagnosis Date  . ALLERGIC RHINITIS 03/13/2007  . Allergy   . Anemia, B12 deficiency   . Anxiety   . C. difficile colitis   . CARPAL TUNNEL SYNDROME, BILATERAL 01/13/2010  . Cervical lesion 07/13/2011   Spine lesion - indeterminate, for f/u MRI may 2013 per neurology  . Chronic fatigue   . CTS (carpal tunnel syndrome)   . Diabetes mellitus without complication ([place])   . ESSENTIAL HYPERTENSION 03/13/2007  . Fibromyalgia   . GERD (gastroesophageal reflux disease)   . Heart murmur   . Hx of adenomatous polyp of colon 06/09/2009  . Hyperlipidemia   . HYPERLIPIDEMIA 03/13/2007  . Irritable bowel syndrome 05/24/2010  . Multiple sclerosis ([place])   . OBSTRUCTIVE SLEEP APNEA 12/08/2008  . Sleep apnea    uses c pap  . VITAMIN D DEFICIENCY 01/13/2010    Past Surgical History:  Procedure Laterality Date  . APPENDECTOMY  07/23/12  . CARPAL TUNNEL RELEASE  01/2010   bilateral  . COLONOSCOPY W/ BIOPSIES AND POLYPECTOMY  05/2009   2 tubular adenomas  . LAPAROSCOPIC APPENDECTOMY N/A 07/23/2012   Procedure: APPENDECTOMY LAPAROSCOPIC;  Surgeon: Gayland Curry, MD;  Location: [facility];   Service: General;  Laterality: N/A;    Social History   Socioeconomic History  . Marital status: Married    Spouse name: Not on file  . Number of children: 2  . Years of education: Not on file  . Highest education level: Not on file  Social Needs  . Financial resource strain: Not on file  . Food insecurity - worry: Not on file  . Food insecurity - inability: Not on file  . Transportation needs - medical: Not on file  . Transportation needs - non-medical: Not on file  Occupational History  . 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 Smoking status: Never Smoker  . Smokeless tobacco: Never Used  Substance and Sexual Activity  . Alcohol use: No    Alcohol/week: 0.0 oz  . Drug use: No  . Sexual activity: Not on file  Other Topics Concern  . Not on file  Social History Narrative   Daily Caffeine    Current Outpatient Medications on File Prior to Visit  Medication Sig Dispense Refill  . atorvastatin (LIPITOR) 20 MG tablet TAKE 1 TABLET DAILY 90 tablet 0  . baclofen (LIORESAL) 10 MG tablet Take 10 mg by mouth daily at 2 PM daily at 2 PM.    . cetirizine (ZYRTEC) 10 MG tablet Take 10 mg by mouth daily.      Marland Kitchen  Cholecalciferol (VITAMIN D) 2000 UNITS CAPS Take 2 capsules by mouth every morning. Reported on 06/05/2015    . cyanocobalamin (,VITAMIN B-12,) 1000 MCG/ML injection Inject into the muscle every 30 (thirty) days.    Marland Kitchen dalfampridine (AMPYRA) 10 MG TB12 Take 10 mg by mouth 2 (two) times daily.    . furosemide (LASIX) 40 MG tablet Take 1 tablet (40 mg total) by mouth daily. 90 tablet 2  . gabapentin (NEURONTIN) 300 MG capsule TAKE 1 CAPSULE THREE TIMES A DAY 270 capsule 0  . glucose blood (FREESTYLE TEST STRIPS) test strip Use to check blood sugar 2 times daily. 150 each 2  . glucose monitoring kit (FREESTYLE) monitoring kit 1 each by Does not apply route as needed for other. Dispense any model that is covered- dispense testing supplies for Q AC/  HS accuchecks- 1 month supply with one refil. 1 each 1  . INSULIN SYRINGE .5CC/28G 28G X 1/2" 0.5 ML MISC 1 each by Does not apply route 4 (four) times daily -  before meals and at bedtime. 100 each 6  . JANUVIA 100 MG tablet TAKE 1 TABLET DAILY 90 tablet 2  . Lancets (FREESTYLE) lancets Use to check blood sugar two time daily. 150 each 2  . loperamide (IMODIUM) 2 MG capsule Take 2 mg by mouth as needed for diarrhea or loose stools.    . medroxyPROGESTERone (DEPO-PROVERA) 150 MG/ML injection Inject 150 mg into the muscle every 3 (three) months.      . metFORMIN (GLUCOPHAGE-XR) 500 MG 24 hr tablet TAKE 1 TABLET TWICE A DAY 180 tablet 1  . metoprolol (LOPRESSOR) 100 MG tablet TAKE 2 TABLETS DAILY 180 tablet 3  . nortriptyline (PAMELOR) 50 MG capsule Take 100 mg by mouth at bedtime. At bedtime    . omeprazole-sodium bicarbonate (ZEGERID) 40-1100 MG per capsule Take 1 capsule by mouth daily.      . potassium chloride (K-DUR,KLOR-CON) 10 MEQ tablet Take 1 tablet (10 mEq total) by mouth daily. 90 tablet 2  . Teriflunomide (AUBAGIO) 14 MG TABS Take 14 mg by mouth daily.    . traMADol (ULTRAM) 50 MG tablet Take 1 tablet (50 mg total) by mouth every 6 (six) hours as needed. 20 tablet 2   No current facility-administered medications on file prior to visit.     Allergies  Allergen Reactions  . Ciprofloxacin In D5w Rash  . Penicillins     REACTION: rash  . Tricor [Fenofibrate] Rash    Family History  Problem Relation Age of Onset  . Migraines Mother   . Hypertension Father   . COPD Father   . Diabetes Father   . Prostate cancer Maternal Uncle        prostate cancer  . Lung cancer Maternal Grandmother   . Stroke Maternal Grandfather   . Heart attack Maternal Grandfather   . Diabetes Other   . Hypertension Other   . Asthma Daughter   . Colon cancer Neg Hx   . Esophageal cancer Neg Hx   . Rectal cancer Neg Hx   . Stomach cancer Neg Hx     BP 123/89 (BP Location: Left Arm, Patient  Position: Sitting, Cuff Size: Normal)   Pulse (!) 105   Wt 195 lb 6.4 oz (88.6 kg)   SpO2 95%   BMI 36.92 kg/m    Review of Systems She denies hypoglycemia and weight change.      Objective:   Physical Exam VITAL SIGNS:  See vs page GENERAL:  no distress Mouth: tongue is slightly red Pulses: dorsalis pedis intact bilat.   MSK: no deformity of the feet CV: no leg edema Skin:  no ulcer on the feet.  normal color and temp on the feet. Neuro: sensation is intact to touch on the feet   Lab Results  Component Value Date   HGBA1C 9.5 03/15/2017       Assessment & Plan:  Insulin-requiring type 2 DM: she needs increased rx.    Thrush: new.   Patient Instructions  check your blood sugar twice a day.  vary the time of day when you check, between before the 3 meals, and at bedtime.  also check if you have symptoms of your blood sugar being too high or too low.  please keep a record of the readings and bring it to your next appointment here.  You can write it on any piece of paper.   i have sent a prescription to your pharmacy, to resume the insulin.  You would take this just before you eat, and only when you eat.  Please increase the insulin to 35 units 3 times a day (just before each meal).   Please call or message [name] next week, to tell us how the blood sugar is doing.  I have sent a prescription to your pharmacy, for a lozenge for the red tongue Please come back for a follow-up appointment in 2 months.

## 2017-03-23 ENCOUNTER — Other Ambulatory Visit: Payer: Medicare Other

## 2017-03-23 DIAGNOSIS — Z79899 Other long term (current) drug therapy: Secondary | ICD-10-CM

## 2017-03-23 LAB — HEPATIC FUNCTION PANEL
AG Ratio: 1.4 (calc) (ref 1.0–2.5)
ALBUMIN MSPROF: 3.6 g/dL (ref 3.6–5.1)
ALKALINE PHOSPHATASE (APISO): 60 U/L (ref 33–115)
ALT: 21 U/L (ref 6–29)
AST: 17 U/L (ref 10–30)
Bilirubin, Direct: 0.1 mg/dL (ref 0.0–0.2)
Globulin: 2.6 g/dL (calc) (ref 1.9–3.7)
Indirect Bilirubin: 0.4 mg/dL (calc) (ref 0.2–1.2)
TOTAL PROTEIN: 6.2 g/dL (ref 6.1–8.1)
Total Bilirubin: 0.5 mg/dL (ref 0.2–1.2)

## 2017-03-27 ENCOUNTER — Encounter: Payer: Self-pay | Admitting: Endocrinology

## 2017-04-01 ENCOUNTER — Other Ambulatory Visit: Payer: Self-pay | Admitting: Endocrinology

## 2017-04-01 ENCOUNTER — Other Ambulatory Visit: Payer: Self-pay | Admitting: Internal Medicine

## 2017-04-04 ENCOUNTER — Encounter: Payer: Self-pay | Admitting: Endocrinology

## 2017-04-14 ENCOUNTER — Ambulatory Visit (INDEPENDENT_AMBULATORY_CARE_PROVIDER_SITE_OTHER): Payer: Medicare Other

## 2017-04-14 DIAGNOSIS — Z309 Encounter for contraceptive management, unspecified: Secondary | ICD-10-CM | POA: Diagnosis not present

## 2017-04-14 DIAGNOSIS — E538 Deficiency of other specified B group vitamins: Secondary | ICD-10-CM | POA: Diagnosis not present

## 2017-04-14 MED ORDER — MEDROXYPROGESTERONE ACETATE 150 MG/ML IM SUSP
150.0000 mg | Freq: Once | INTRAMUSCULAR | Status: AC
  Administered 2017-04-14: 150 mg via INTRAMUSCULAR

## 2017-04-14 MED ORDER — CYANOCOBALAMIN 1000 MCG/ML IJ SOLN
1000.0000 ug | Freq: Once | INTRAMUSCULAR | Status: AC
  Administered 2017-04-14: 1000 ug via INTRAMUSCULAR

## 2017-04-14 NOTE — Progress Notes (Signed)
Medical screening examination/treatment/procedure(s) were performed by non-physician practitioner and as supervising physician I was immediately available for consultation/collaboration. I agree with above. Lalena Salas, MD   

## 2017-04-21 ENCOUNTER — Ambulatory Visit: Payer: Medicare Other | Admitting: Internal Medicine

## 2017-04-24 ENCOUNTER — Other Ambulatory Visit: Payer: Medicare Other

## 2017-04-24 ENCOUNTER — Other Ambulatory Visit: Payer: Self-pay

## 2017-04-24 DIAGNOSIS — Z79899 Other long term (current) drug therapy: Secondary | ICD-10-CM

## 2017-04-24 LAB — HEPATIC FUNCTION PANEL
AG Ratio: 1.6 (calc) (ref 1.0–2.5)
ALBUMIN MSPROF: 3.9 g/dL (ref 3.6–5.1)
ALT: 19 U/L (ref 6–29)
AST: 19 U/L (ref 10–30)
Alkaline phosphatase (APISO): 58 U/L (ref 33–115)
BILIRUBIN DIRECT: 0.1 mg/dL (ref 0.0–0.2)
BILIRUBIN TOTAL: 0.4 mg/dL (ref 0.2–1.2)
Globulin: 2.5 g/dL (calc) (ref 1.9–3.7)
Indirect Bilirubin: 0.3 mg/dL (calc) (ref 0.2–1.2)
Total Protein: 6.4 g/dL (ref 6.1–8.1)

## 2017-04-25 ENCOUNTER — Telehealth: Payer: Self-pay

## 2017-04-25 DIAGNOSIS — Z79899 Other long term (current) drug therapy: Secondary | ICD-10-CM

## 2017-04-25 NOTE — Telephone Encounter (Signed)
-----   Message from Pieter Partridge, DO sent at 04/25/2017  7:39 AM EST ----- Labs normal.  Recheck hepatic function panel in one month.

## 2017-04-25 NOTE — Telephone Encounter (Signed)
Called Pt, LM on VM advising her of normal hepatic function labs and to come back 05/25/17 for labs again. Advsd Pt to call with any questions

## 2017-05-01 ENCOUNTER — Ambulatory Visit (INDEPENDENT_AMBULATORY_CARE_PROVIDER_SITE_OTHER): Payer: Medicare Other | Admitting: Internal Medicine

## 2017-05-01 ENCOUNTER — Encounter: Payer: Self-pay | Admitting: Internal Medicine

## 2017-05-01 DIAGNOSIS — G4733 Obstructive sleep apnea (adult) (pediatric): Secondary | ICD-10-CM

## 2017-05-01 DIAGNOSIS — G4710 Hypersomnia, unspecified: Secondary | ICD-10-CM | POA: Diagnosis not present

## 2017-05-01 NOTE — Progress Notes (Signed)
01/19/17-42 year old female never smoker for sleep evaluation. Referred by Dr. Tomi Likens for sleep consult.  Pt states she was dx'ed with sleep apnea "years ago", does not currently wear cpap.  pt c/o worsening daytime sleepiness. Epworth score: 16.  NPSG 11/29/08- AHI 4/ hr, RDI 10/ hr, desaturation to 90%, Body weight 205 lbs Medical problem list includes allergic rhinitis, anxiety, IBS, DM 2, HBP, GERD, MS She complains of daytime sleepiness especially on long drives, reported loud snoring and witnessed apneas. She sleeps alone without current reporter. No sleep medications. Caffeine described as 3 Dr. Samson Frederic per day No parasomnias. Naps most days at lunchtime. Bedtime around 11 PM with short sleep latency, waking 3 or 4 times before up at 6:30. Epworth score 16/24  05/01/17-42 year old female never smoker followed for OSA, complicated by allergic rhinitis, anxiety, IBS, DM 2, HBP, GERD, MS HST-02/10/17- AHI 1.9/hour, desaturation to 85%, body weight 203 pounds We reviewed her sleep study which is very similar to 2010 and does not indicate clinically significant obstructive sleep apnea.  She describes occasions when she has fallen asleep reading to her children, been aware of their voices as she drifted into sleep.  She is not describing sleep paralysis or cataplexy.  ROS-see HPI   + = Positive Constitutional:    weight loss, night sweats, fevers, chills, fatigue, lassitude. HEENT:    headaches, difficulty swallowing, tooth/dental problems, sore throat,       sneezing, itching, ear ache, nasal congestion, post nasal drip, snoring CV:    chest pain, orthopnea, PND, swelling in lower extremities, anasarca,                                                            dizziness, palpitations Resp:   shortness of breath with exertion or at rest.                productive cough,   non-productive cough, coughing up of blood.              change in color of mucus.  wheezing.   Skin:    rash or lesions. GI:   No-   heartburn, indigestion, abdominal pain, nausea, vomiting, diarrhea,                 change in bowel habits, loss of appetite GU: dysuria, change in color of urine, no urgency or frequency.   flank pain. MS:   joint pain, stiffness, decreased range of motion, back pain. Neuro-     nothing unusual Psych:  change in mood or affect.  depression or anxiety.   memory loss.  OBJ- Physical Exam General- Alert, Oriented, Affect-appropriate, Distress- none acute , + overweight Skin- rash-none, lesions- none, excoriation- none Lymphadenopathy- none Head- atraumatic            Eyes- Gross vision intact, PERRLA, conjunctivae and secretions clear            Ears- Hearing, canals-normal            Nose- Clear, no-Septal dev, mucus, polyps, erosion, perforation             Throat- Mallampati II , mucosa clear , drainage- none, tonsils- atrophic Neck- flexible , trachea midline, no stridor , thyroid nl, carotid no bruit Chest - symmetrical excursion , unlabored  Heart/CV- RRR , no murmur , no gallop  , no rub, nl s1 s2                           - JVD- none , edema- none, stasis changes- none, varices- none           Lung- clear to P&A, wheeze- none, cough- none , dullness-none, rub- none           Chest wall-  Abd-  Br/ Gen/ Rectal- Not done, not indicated Extrem- cyanosis- none, clubbing, none, atrophy- none, strength- nl Neuro- grossly intact to observation

## 2017-05-01 NOTE — Assessment & Plan Note (Signed)
This diagnosis is ruled out by her 2 normal sleep test and is resolved.

## 2017-05-01 NOTE — Patient Instructions (Signed)
You do not have sleep apnea. Dr Tomi Likens might want to see how you do with a stimulant medication. If needed, we can arrange more formal evaluation for primary disorders of excessive daytime sleepiness with an overnight sleep study followed by a daytime sleep test at the [facility]. This would need to be done with you off sedating or stimulant meds and antidepressants for a few days.

## 2017-05-01 NOTE — Assessment & Plan Note (Signed)
She does not have obstructive sleep apnea based on her sleep studies and I do not get a history of cataplexy or sleep paralysis at this time.  Her complaints may be secondary to her MS.  If appropriate in the future, we can schedule formal evaluation for a primary disorder of excessive daytime somnolence by scheduling an NPSG, followed by MSLT, off medications which would impact sleep, and antidepressants for a few days.

## 2017-05-05 ENCOUNTER — Encounter: Payer: Self-pay | Admitting: Endocrinology

## 2017-05-15 ENCOUNTER — Other Ambulatory Visit: Payer: Self-pay | Admitting: Internal Medicine

## 2017-05-15 ENCOUNTER — Ambulatory Visit (INDEPENDENT_AMBULATORY_CARE_PROVIDER_SITE_OTHER): Payer: Medicare Other | Admitting: Endocrinology

## 2017-05-15 ENCOUNTER — Encounter: Payer: Self-pay | Admitting: Endocrinology

## 2017-05-15 VITALS — BP 162/96 | HR 91 | Wt 209.0 lb

## 2017-05-15 DIAGNOSIS — E119 Type 2 diabetes mellitus without complications: Secondary | ICD-10-CM

## 2017-05-15 DIAGNOSIS — Z794 Long term (current) use of insulin: Secondary | ICD-10-CM

## 2017-05-15 LAB — POCT GLYCOSYLATED HEMOGLOBIN (HGB A1C): HEMOGLOBIN A1C: 8

## 2017-05-15 NOTE — Progress Notes (Signed)
Subjective:    Patient ID: Courtney [name], female    DOB: Jan 28, 1976, 42 y.o.   MRN: [id number]  HPI Pt returns for f/u of diabetes mellitus: DM type: Insulin-requiring type 2 Dx'ed: 2016, when she presented with nonketotic hyperosmolar hyperglycemic state, but not DKA Complications: none Therapy: insulin since 2018, and 2 oral meds. GDM: never. DKA: never. Severe hypoglycemia: never.  Pancreatitis: never.   Other: she takes multiple daily injections; metformin dosage is limited by nausea.   She declines weight loss surgery. Interval history:  she brings a record of her cbg's which i have reviewed today.  It varies from 150-200 fasting, which is the only time of day she checks.   She says she never misses the insulin.  She just increased to 35 units 3 times a day (just before each meal), a few days ago Past Medical History:  Diagnosis Date  . ALLERGIC RHINITIS 03/13/2007  . Allergy   . Anemia, B12 deficiency   . Anxiety   . C. difficile colitis   . CARPAL TUNNEL SYNDROME, BILATERAL 01/13/2010  . Cervical lesion 07/13/2011   Spine lesion - indeterminate, for f/u MRI may 2013 per neurology  . Chronic fatigue   . CTS (carpal tunnel syndrome)   . Diabetes mellitus without complication ([place])   . ESSENTIAL HYPERTENSION 03/13/2007  . Fibromyalgia   . GERD (gastroesophageal reflux disease)   . Heart murmur   . Hx of adenomatous polyp of colon 06/09/2009  . Hyperlipidemia   . HYPERLIPIDEMIA 03/13/2007  . Irritable bowel syndrome 05/24/2010  . Multiple sclerosis ([place])   . OBSTRUCTIVE SLEEP APNEA 12/08/2008  . Sleep apnea    uses c pap  . VITAMIN D DEFICIENCY 01/13/2010    Past Surgical History:  Procedure Laterality Date  . APPENDECTOMY  07/23/12  . CARPAL TUNNEL RELEASE  01/2010   bilateral  . COLONOSCOPY W/ BIOPSIES AND POLYPECTOMY  05/2009   2 tubular adenomas  . LAPAROSCOPIC APPENDECTOMY N/A 07/23/2012   Procedure: APPENDECTOMY LAPAROSCOPIC;  Surgeon: Gayland Curry, MD;  Location:  [facility] OR;  Service: General;  Laterality: N/A;    Social History   Socioeconomic History  . Marital status: Married    Spouse name: Not on file  . Number of children: 2  . Years of education: Not on file  . Highest education level: Not on file  Social Needs  . Financial resource strain: Not on file  . Food insecurity - worry: Not on file  . Food insecurity - inability: Not on file  . Transportation needs - medical: Not on file  . Transportation needs - non-medical: Not on file  Occupational History  . 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 Smoking status: Never Smoker  . Smokeless tobacco: Never Used  Substance and Sexual Activity  . Alcohol use: No    Alcohol/week: 0.0 oz  . Drug use: No  . Sexual activity: Not on file  Other Topics Concern  . Not on file  Social History Narrative   Daily Caffeine    Current Outpatient Medications on File Prior to Visit  Medication Sig Dispense Refill  . atorvastatin (LIPITOR) 20 MG tablet TAKE 1 TABLET DAILY 90 tablet 0  . baclofen (LIORESAL) 10 MG tablet Take 10 mg by mouth daily at 2 PM daily at 2 PM.    . cetirizine (ZYRTEC) 10 MG tablet Take 10 mg by mouth daily.      Marland Kitchen  Cholecalciferol (VITAMIN D) 2000 UNITS CAPS Take 2 capsules by mouth every morning. Reported on 06/05/2015    . clotrimazole (MYCELEX) 10 MG troche Take 1 tablet (10 mg total) by mouth 5 (five) times daily. 35 tablet 2  . cyanocobalamin (,VITAMIN B-12,) 1000 MCG/ML injection Inject into the muscle every 30 (thirty) days.    Marland Kitchen dalfampridine (AMPYRA) 10 MG TB12 Take 10 mg by mouth 2 (two) times daily.    . furosemide (LASIX) 40 MG tablet TAKE 1 TABLET DAILY 90 tablet 0  . gabapentin (NEURONTIN) 300 MG capsule TAKE 1 CAPSULE THREE TIMES A DAY 270 capsule 0  . glucose blood (FREESTYLE TEST STRIPS) test strip Use to check blood sugar 2 times daily. 150 each 2  . glucose monitoring kit (FREESTYLE) monitoring kit 1 each by Does not apply  route as needed for other. Dispense any model that is covered- dispense testing supplies for Q AC/ HS accuchecks- 1 month supply with one refil. 1 each 1  . insulin lispro (HUMALOG KWIKPEN) 100 UNIT/ML KiwkPen Inject 0.35 mLs (35 Units total) into the skin 3 (three) times daily with meals. And pen needles 3/day 30 pen 3  . INSULIN SYRINGE .5CC/28G 28G X 1/2" 0.5 ML MISC 1 each by Does not apply route 4 (four) times daily -  before meals and at bedtime. 100 each 6  . JANUVIA 100 MG tablet TAKE 1 TABLET DAILY 90 tablet 2  . Lancets (FREESTYLE) lancets Use to check blood sugar two time daily. 150 each 2  . loperamide (IMODIUM) 2 MG capsule Take 2 mg by mouth as needed for diarrhea or loose stools.    . medroxyPROGESTERone (DEPO-PROVERA) 150 MG/ML injection Inject 150 mg into the muscle every 3 (three) months.      . metFORMIN (GLUCOPHAGE-XR) 500 MG 24 hr tablet TAKE 1 TABLET TWICE A DAY 180 tablet 0  . metoprolol tartrate (LOPRESSOR) 100 MG tablet TAKE 2 TABLETS DAILY 180 tablet 0  . nortriptyline (PAMELOR) 50 MG capsule Take 100 mg by mouth at bedtime. At bedtime    . omeprazole-sodium bicarbonate (ZEGERID) 40-1100 MG per capsule Take 1 capsule by mouth daily.      . potassium chloride (K-DUR,KLOR-CON) 10 MEQ tablet TAKE 1 TABLET DAILY 90 tablet 0  . Teriflunomide (AUBAGIO) 14 MG TABS Take 14 mg by mouth daily.    . traMADol (ULTRAM) 50 MG tablet Take 1 tablet (50 mg total) by mouth every 6 (six) hours as needed. 20 tablet 2   No current facility-administered medications on file prior to visit.     Allergies  Allergen Reactions  . Ciprofloxacin In D5w Rash  . Penicillins     REACTION: rash  . Tricor [Fenofibrate] Rash    Family History  Problem Relation Age of Onset  . Migraines Mother   . Hypertension Father   . COPD Father   . Diabetes Father   . Prostate cancer Maternal Uncle        prostate cancer  . Lung cancer Maternal Grandmother   . Stroke Maternal Grandfather   . Heart  attack Maternal Grandfather   . Diabetes Other   . Hypertension Other   . Asthma Daughter   . Colon cancer Neg Hx   . Esophageal cancer Neg Hx   . Rectal cancer Neg Hx   . Stomach cancer Neg Hx     BP (!) 162/96 (BP Location: Left Arm, Patient Position: Sitting, Cuff Size: Normal)   Pulse 91   Wt  209 lb (94.8 kg)   SpO2 97%   BMI 39.49 kg/m    Review of Systems She denies hypoglycemia.     Objective:   Physical Exam VITAL SIGNS:  See vs page GENERAL: no distress Pulses: foot pulses are intact bilaterally.   MSK: no deformity of the feet or ankles.  CV: no edema of the legs or ankles Skin:  no ulcer on the feet or ankles.  normal color and temp on the feet and ankles Neuro: sensation is intact to touch on the feet and ankles.    Lab Results  Component Value Date   HGBA1C 8.0 05/15/2017       Assessment & Plan:  Insulin-requiring type 2 DM: a1c says she needs increased rx.  However, this a1c does not reflect recent dosage increase.    Patient Instructions  check your blood sugar twice a day.  vary the time of day when you check, between before the 3 meals, and at bedtime.  also check if you have symptoms of your blood sugar being too high or too low.  please keep a record of the readings and bring it to your next appointment here.  You can write it on any piece of paper.   Please continue the same insulin.  Please come back for a follow-up appointment in 2 months.

## 2017-05-15 NOTE — Patient Instructions (Addendum)
check your blood sugar twice a day.  vary the time of day when you check, between before the 3 meals, and at bedtime.  also check if you have symptoms of your blood sugar being too high or too low.  please keep a record of the readings and bring it to your next appointment here.  You can write it on any piece of paper.   Please continue the same insulin.  Please come back for a follow-up appointment in 2 months.

## 2017-05-16 ENCOUNTER — Ambulatory Visit (INDEPENDENT_AMBULATORY_CARE_PROVIDER_SITE_OTHER): Payer: Medicare Other

## 2017-05-16 DIAGNOSIS — E538 Deficiency of other specified B group vitamins: Secondary | ICD-10-CM

## 2017-05-16 MED ORDER — CYANOCOBALAMIN 1000 MCG/ML IJ SOLN
1000.0000 ug | Freq: Once | INTRAMUSCULAR | Status: AC
  Administered 2017-05-16: 1000 ug via INTRAMUSCULAR

## 2017-05-16 NOTE — Progress Notes (Signed)
Medical screening examination/treatment/procedure(s) were performed by non-physician practitioner and as supervising physician I was immediately available for consultation/collaboration. I agree with above. Tiffanny Lamarche, MD   

## 2017-05-20 ENCOUNTER — Encounter: Payer: Self-pay | Admitting: Internal Medicine

## 2017-05-22 MED ORDER — ATORVASTATIN CALCIUM 20 MG PO TABS: 20.0000 mg | ORAL_TABLET | Freq: Every day | ORAL | 0 refills | Status: DC

## 2017-05-25 ENCOUNTER — Other Ambulatory Visit: Payer: Medicare Other

## 2017-05-25 DIAGNOSIS — Z79899 Other long term (current) drug therapy: Secondary | ICD-10-CM | POA: Diagnosis not present

## 2017-05-25 LAB — HEPATIC FUNCTION PANEL
AG RATIO: 1.5 (calc) (ref 1.0–2.5)
ALKALINE PHOSPHATASE (APISO): 59 U/L (ref 33–115)
ALT: 19 U/L (ref 6–29)
AST: 19 U/L (ref 10–30)
Albumin: 4 g/dL (ref 3.6–5.1)
Bilirubin, Direct: 0.1 mg/dL (ref 0.0–0.2)
Globulin: 2.7 g/dL (calc) (ref 1.9–3.7)
Indirect Bilirubin: 0.3 mg/dL (calc) (ref 0.2–1.2)
Total Bilirubin: 0.4 mg/dL (ref 0.2–1.2)
Total Protein: 6.7 g/dL (ref 6.1–8.1)

## 2017-05-29 ENCOUNTER — Telehealth: Payer: Self-pay

## 2017-05-29 DIAGNOSIS — Z79899 Other long term (current) drug therapy: Secondary | ICD-10-CM

## 2017-05-29 NOTE — Telephone Encounter (Signed)
-----   Message from Pieter Partridge, DO sent at 05/26/2017  6:45 AM EST ----- Liver tests are normal.  Repeat hepatic function panel in one month.

## 2017-05-29 NOTE — Telephone Encounter (Signed)
Called Pt, LM on VM, advsd liver tests are normal, recheck in 1 month, advsd her I had made appt on 3/8 Fri, and to call back if needed to change that.

## 2017-06-15 ENCOUNTER — Ambulatory Visit (INDEPENDENT_AMBULATORY_CARE_PROVIDER_SITE_OTHER): Payer: Medicare Other | Admitting: *Deleted

## 2017-06-15 ENCOUNTER — Encounter: Payer: Self-pay | Admitting: Neurology

## 2017-06-15 ENCOUNTER — Encounter: Payer: Self-pay | Admitting: Internal Medicine

## 2017-06-15 DIAGNOSIS — E538 Deficiency of other specified B group vitamins: Secondary | ICD-10-CM

## 2017-06-15 MED ORDER — CYANOCOBALAMIN 1000 MCG/ML IJ SOLN
1000.0000 ug | Freq: Once | INTRAMUSCULAR | Status: AC
  Administered 2017-06-15: 1000 ug via INTRAMUSCULAR

## 2017-06-23 ENCOUNTER — Other Ambulatory Visit: Payer: Medicare Other

## 2017-06-23 DIAGNOSIS — Z79899 Other long term (current) drug therapy: Secondary | ICD-10-CM

## 2017-06-23 LAB — HEPATIC FUNCTION PANEL
AG RATIO: 1.5 (calc) (ref 1.0–2.5)
ALBUMIN MSPROF: 3.8 g/dL (ref 3.6–5.1)
ALT: 21 U/L (ref 6–29)
AST: 19 U/L (ref 10–30)
Alkaline phosphatase (APISO): 61 U/L (ref 33–115)
BILIRUBIN TOTAL: 0.4 mg/dL (ref 0.2–1.2)
Bilirubin, Direct: 0.1 mg/dL (ref 0.0–0.2)
Globulin: 2.5 g/dL (calc) (ref 1.9–3.7)
Indirect Bilirubin: 0.3 mg/dL (calc) (ref 0.2–1.2)
Total Protein: 6.3 g/dL (ref 6.1–8.1)

## 2017-06-29 ENCOUNTER — Other Ambulatory Visit: Payer: Self-pay | Admitting: Internal Medicine

## 2017-07-05 ENCOUNTER — Ambulatory Visit: Payer: Medicare Other | Admitting: Neurology

## 2017-07-13 ENCOUNTER — Ambulatory Visit: Payer: Medicare Other | Admitting: Endocrinology

## 2017-07-13 ENCOUNTER — Ambulatory Visit: Payer: Medicare Other

## 2017-07-14 ENCOUNTER — Ambulatory Visit (INDEPENDENT_AMBULATORY_CARE_PROVIDER_SITE_OTHER): Payer: Medicare Other

## 2017-07-14 DIAGNOSIS — Z309 Encounter for contraceptive management, unspecified: Secondary | ICD-10-CM | POA: Diagnosis not present

## 2017-07-14 DIAGNOSIS — E538 Deficiency of other specified B group vitamins: Secondary | ICD-10-CM

## 2017-07-14 MED ORDER — CYANOCOBALAMIN 1000 MCG/ML IJ SOLN
1000.0000 ug | Freq: Once | INTRAMUSCULAR | Status: AC
  Administered 2017-07-14: 1000 ug via INTRAMUSCULAR

## 2017-07-14 MED ORDER — MEDROXYPROGESTERONE ACETATE 150 MG/ML IM SUSP
150.0000 mg | Freq: Once | INTRAMUSCULAR | Status: AC
  Administered 2017-07-14: 150 mg via INTRAMUSCULAR

## 2017-07-18 ENCOUNTER — Ambulatory Visit
Admission: RE | Admit: 2017-07-18 | Discharge: 2017-07-18 | Disposition: A | Discharge status: Home / Self Care | Payer: Medicare Other | Source: Ambulatory Visit | Attending: Endocrinology | Admitting: Endocrinology

## 2017-07-18 ENCOUNTER — Encounter: Payer: Self-pay | Admitting: Endocrinology

## 2017-07-18 ENCOUNTER — Ambulatory Visit (INDEPENDENT_AMBULATORY_CARE_PROVIDER_SITE_OTHER): Payer: Medicare Other | Admitting: Endocrinology

## 2017-07-18 VITALS — BP 165/104 | HR 88 | Wt 201.8 lb

## 2017-07-18 DIAGNOSIS — M79671 Pain in right foot: Secondary | ICD-10-CM | POA: Diagnosis not present

## 2017-07-18 DIAGNOSIS — Z794 Long term (current) use of insulin: Secondary | ICD-10-CM | POA: Diagnosis not present

## 2017-07-18 DIAGNOSIS — E119 Type 2 diabetes mellitus without complications: Secondary | ICD-10-CM | POA: Diagnosis not present

## 2017-07-18 DIAGNOSIS — M7989 Other specified soft tissue disorders: Secondary | ICD-10-CM | POA: Diagnosis not present

## 2017-07-18 LAB — POCT GLYCOSYLATED HEMOGLOBIN (HGB A1C): Hemoglobin A1C: 7

## 2017-07-18 MED ORDER — INSULIN LISPRO 100 UNIT/ML (KWIKPEN): PEN_INJECTOR | SUBCUTANEOUS | 3 refills | Status: DC

## 2017-07-18 NOTE — Patient Instructions (Addendum)
check your blood sugar twice a day.  vary the time of day when you check, between before the 3 meals, and at bedtime.  also check if you have symptoms of your blood sugar being too high or too low.  please keep a record of the readings and bring it to your next appointment here.  You can write it on any piece of paper.   Please increase the insulin to 3 times a day (just before each meal), 35-35-40 units. X-rays are requested for you today.  We'll let you know about the results. Please come back for a follow-up appointment in 3-4 months.

## 2017-07-18 NOTE — Progress Notes (Signed)
Subjective:    Patient ID: Courtney [name], female    DOB: 09/17/1975, 42 y.o.   MRN: [id number]  HPI Pt returns for f/u of diabetes mellitus: DM type: Insulin-requiring type 2 Dx'ed: 2016, when she presented with nonketotic hyperosmolar hyperglycemic state, but not DKA.   Complications: none Therapy: insulin since 2018, and 2 oral meds. GDM: never. DKA: never. Severe hypoglycemia: never.  Pancreatitis: never.   Other: she takes multiple daily injections; metformin dosage is limited by nausea.   She declines weight loss surgery. Pt reports symptoms: painful nodule at the right heel she brings a record of her cbg's which I have reviewed today.  It varies from 115-200's.  It is in general highest fasting, but she does not chack at HS continuous glucose monitor data are reviewed today. Pt takes meds as rx'ed.  Past Medical History:  Diagnosis Date  . ALLERGIC RHINITIS 03/13/2007  . Allergy   . Anemia, B12 deficiency   . Anxiety   . C. difficile colitis   . CARPAL TUNNEL SYNDROME, BILATERAL 01/13/2010  . Cervical lesion 07/13/2011   Spine lesion - indeterminate, for f/u MRI may 2013 per neurology  . Chronic fatigue   . CTS (carpal tunnel syndrome)   . Diabetes mellitus without complication ([place])   . ESSENTIAL HYPERTENSION 03/13/2007  . Fibromyalgia   . GERD (gastroesophageal reflux disease)   . Heart murmur   . Hx of adenomatous polyp of colon 06/09/2009  . Hyperlipidemia   . HYPERLIPIDEMIA 03/13/2007  . Irritable bowel syndrome 05/24/2010  . Multiple sclerosis ([place])   . OBSTRUCTIVE SLEEP APNEA 12/08/2008  . Sleep apnea    uses c pap  . VITAMIN D DEFICIENCY 01/13/2010    Past Surgical History:  Procedure Laterality Date  . APPENDECTOMY  07/23/12  . CARPAL TUNNEL RELEASE  01/2010   bilateral  . COLONOSCOPY W/ BIOPSIES AND POLYPECTOMY  05/2009   2 tubular adenomas  . LAPAROSCOPIC APPENDECTOMY N/A 07/23/2012   Procedure: APPENDECTOMY LAPAROSCOPIC;  Surgeon: Gayland Curry, MD;   Location: [facility] OR;  Service: General;  Laterality: N/A;    Social History   Socioeconomic History  . Marital status: Married    Spouse name: Not on file  . Number of children: 2  . Years of education: Not on file  . Highest education level: Not on file  Occupational History  . 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 Financial resource strain: Not on file  . Food insecurity:    Worry: Not on file    Inability: Not on file  . Transportation needs:    Medical: Not on file    Non-medical: Not on file  Tobacco Use  . Smoking status: Never Smoker  . Smokeless tobacco: Never Used  Substance and Sexual Activity  . Alcohol use: No    Alcohol/week: 0.0 oz  . Drug use: No  . Sexual activity: Not on file  Lifestyle  . Physical activity:    Days per week: Not on file    Minutes per session: Not on file  . Stress: Not on file  Relationships  . Social connections:    Talks on phone: Not on file    Gets together: Not on file    Attends religious service: Not on file    Active member of club or organization: Not on file    Attends meetings of clubs or organizations: Not on file    Relationship status:  Not on file  . Intimate partner violence:    Fear of current or ex partner: Not on file    Emotionally abused: Not on file    Physically abused: Not on file    Forced sexual activity: Not on file  Other Topics Concern  . Not on file  Social History Narrative   Daily Caffeine    Current Outpatient Medications on File Prior to Visit  Medication Sig Dispense Refill  . atorvastatin (LIPITOR) 20 MG tablet Take 1 tablet (20 mg total) by mouth daily. 90 tablet 0  . baclofen (LIORESAL) 10 MG tablet Take 10 mg by mouth daily at 2 PM daily at 2 PM.    . cetirizine (ZYRTEC) 10 MG tablet Take 10 mg by mouth daily.      . Cholecalciferol (VITAMIN D) 2000 UNITS CAPS Take 2 capsules by mouth every morning. Reported on 06/05/2015    . clotrimazole (MYCELEX) 10  MG troche Take 1 tablet (10 mg total) by mouth 5 (five) times daily. 35 tablet 2  . cyanocobalamin (,VITAMIN B-12,) 1000 MCG/ML injection Inject into the muscle every 30 (thirty) days.    Marland Kitchen dalfampridine (AMPYRA) 10 MG TB12 Take 10 mg by mouth 2 (two) times daily.    . furosemide (LASIX) 40 MG tablet TAKE 1 TABLET DAILY 90 tablet 0  . gabapentin (NEURONTIN) 300 MG capsule TAKE 1 CAPSULE THREE TIMES A DAY 270 capsule 0  . glucose blood (FREESTYLE TEST STRIPS) test strip Use to check blood sugar 2 times daily. 150 each 2  . glucose monitoring kit (FREESTYLE) monitoring kit 1 each by Does not apply route as needed for other. Dispense any model that is covered- dispense testing supplies for Q AC/ HS accuchecks- 1 month supply with one refil. 1 each 1  . INSULIN SYRINGE .5CC/28G 28G X 1/2" 0.5 ML MISC 1 each by Does not apply route 4 (four) times daily -  before meals and at bedtime. 100 each 6  . JANUVIA 100 MG tablet TAKE 1 TABLET DAILY 90 tablet 2  . Lancets (FREESTYLE) lancets Use to check blood sugar two time daily. 150 each 2  . loperamide (IMODIUM) 2 MG capsule Take 2 mg by mouth as needed for diarrhea or loose stools.    . medroxyPROGESTERone (DEPO-PROVERA) 150 MG/ML injection Inject 150 mg into the muscle every 3 (three) months.      . metFORMIN (GLUCOPHAGE-XR) 500 MG 24 hr tablet TAKE 1 TABLET TWICE A DAY 180 tablet 0  . metoprolol tartrate (LOPRESSOR) 100 MG tablet TAKE 2 TABLETS DAILY 180 tablet 0  . nortriptyline (PAMELOR) 50 MG capsule Take 100 mg by mouth at bedtime. At bedtime    . omeprazole-sodium bicarbonate (ZEGERID) 40-1100 MG per capsule Take 1 capsule by mouth daily.      . potassium chloride (K-DUR,KLOR-CON) 10 MEQ tablet TAKE 1 TABLET DAILY 90 tablet 0  . Teriflunomide (AUBAGIO) 14 MG TABS Take 14 mg by mouth daily.    . traMADol (ULTRAM) 50 MG tablet Take 1 tablet (50 mg total) by mouth every 6 (six) hours as needed. 20 tablet 2   No current facility-administered medications  on file prior to visit.     Allergies  Allergen Reactions  . Ciprofloxacin In D5w Rash  . Penicillins     REACTION: rash  . Tricor [Fenofibrate] Rash    Family History  Problem Relation Age of Onset  . Migraines Mother   . Hypertension Father   . COPD Father   .  Diabetes Father   . Prostate cancer Maternal Uncle        prostate cancer  . Lung cancer Maternal Grandmother   . Stroke Maternal Grandfather   . Heart attack Maternal Grandfather   . Diabetes Other   . Hypertension Other   . Asthma Daughter   . Colon cancer Neg Hx   . Esophageal cancer Neg Hx   . Rectal cancer Neg Hx   . Stomach cancer Neg Hx     BP (!) 165/104 (BP Location: Left Arm, Cuff Size: Normal)   Pulse 88   Wt 201 lb 12.8 oz (91.5 kg)   SpO2 97%   BMI 38.13 kg/m     Review of Systems She denies hypoglycemia    Objective:   Physical Exam VITAL SIGNS:  See vs page GENERAL: no distress Pulses: dorsalis pedis intact bilat.   MSK: no deformity of the feet, except for nodule at right achilles area.  CV: no leg edema Skin:  no ulcer on the feet.  normal color and temp on the feet. Neuro: sensation is intact to touch on the feet  Lab Results  Component Value Date   HGBA1C 7.0 07/18/2017       Assessment & Plan:  Insulin-requiring type 2 DM: she needs increased rx, if it can be done with a regimen that avoids or minimizes hypoglycemia. Heel pain, new, uncertain etiology.   Patient Instructions  check your blood sugar twice a day.  vary the time of day when you check, between before the 3 meals, and at bedtime.  also check if you have symptoms of your blood sugar being too high or too low.  please keep a record of the readings and bring it to your next appointment here.  You can write it on any piece of paper.   Please increase the insulin to 3 times a day (just before each meal), 35-35-40 units. X-rays are requested for you today.  We'll let you know about the results. Please come back for a  follow-up appointment in 3-4 months.

## 2017-07-19 ENCOUNTER — Other Ambulatory Visit: Payer: Self-pay | Admitting: Endocrinology

## 2017-07-19 ENCOUNTER — Encounter: Payer: Self-pay | Admitting: Endocrinology

## 2017-07-19 DIAGNOSIS — M779 Enthesopathy, unspecified: Secondary | ICD-10-CM | POA: Insufficient documentation

## 2017-07-28 ENCOUNTER — Encounter: Payer: Self-pay | Admitting: Neurology

## 2017-07-28 ENCOUNTER — Other Ambulatory Visit: Payer: Medicare Other

## 2017-07-28 ENCOUNTER — Ambulatory Visit (INDEPENDENT_AMBULATORY_CARE_PROVIDER_SITE_OTHER): Payer: Medicare Other | Admitting: Neurology

## 2017-07-28 ENCOUNTER — Other Ambulatory Visit: Payer: Self-pay

## 2017-07-28 VITALS — BP 136/78 | HR 88 | Ht 61.0 in | Wt 203.0 lb

## 2017-07-28 DIAGNOSIS — E559 Vitamin D deficiency, unspecified: Secondary | ICD-10-CM

## 2017-07-28 DIAGNOSIS — G35 Multiple sclerosis: Secondary | ICD-10-CM

## 2017-07-28 NOTE — Progress Notes (Signed)
NEUROLOGY FOLLOW UP OFFICE NOTE  Courtney [name] [id number]  HISTORY OF PRESENT ILLNESS: Courtney [name] is a 42 year old right-handed Caucasian female who follows up for relapsing-remitting multiple sclerosis.    UPDATE: Imaging personally reviewed.  MRI of brain with and without contrast from 02/07/17 demonstrated no significant change since 12/24/08.  MRI of cervical spine with and without contrast demonstrated chronic lesion at C4 level, stable since 2013.  Last visit in September, we discussed starting DMT.  She started [place] in November.  Quantiferon tb gold negative.  Monthly hepatic function tests for past 5 months have been normal.  D 25 hydroxy from September was 24.16.  She was advised to increase D3 from 2000 IU to 4000 IU daily.  There have been no neurologic changes.  She has right foot pain and was found to have bone spurs.  Since starting the Aubagio, she reports hair thinning and diarrhea.  Immodium does not seem to help.  Vision:  no Motor:  Residual right upper and lower extremity weakness Sensory:  no Pain:  Occasional pain and tightness in her lower extremities Gait:  okay Bowel/bladder:  diarrhea Fatigue:  No.  Further testing revealed she does not have OSA. Cognition:  She sometimes has trouble remembering new information. Mood:  good  Current medication:  Baclofen 27m, gabapentin 3080mthree times daily, D3 2000 IU daily, B12 1000 mcg injection every 30 days, nortriptyline 5041mtramadol 21m42mparingly)  HISTORY:  Onset of symptoms and diagnosis occurred in 2006.  She experienced an episode of numbness in her right arm and both lower extremities.  She was dropping objects.  MRI of brain reportedly showed "solitary right mid-to-posterior frontal periventricular white matter lesion".  MRI of spine revealed no cord demyelination.  CSF analysis from lumbar puncture reportedly revealed oligoclonal bands and an elevated IgG index.  She was diagnosed with clinically  isolated syndrome but was officially diagnosed with multiple sclerosis a few months later when she developed a relapse with left hand numbness.  In February 2013, she developed left arm pain and swelling.  MRI of cervical spine revealed an enhancing spinal cord lesion at C4 on the right.  She was treated with IV Solumedrol.  In 2014, she had an episode of optic neuritis in her right eye, treated with IV Solumedrol   There is no family history of MS.   Past disease modifying therapy:  Avonex (difficulty injecting), Rebif (painful injections), Tecfidera (GI side effects)   Imaging: 06/03/11:  MRI brain with and without contrast:  single right frontal periventricular white matter lesion with high T2 FLAIR signal 06/03/11:  MRI cervical spine with and without contrast:  abnormal rim enhancing lesion in the spinal cord at the level of C4 on the right which mildly expands the cord. 01/21/12:  MRI brain with and without contrast:  unchanged nonspecific right periventricular white matter lesion 01/21/12: MRI cervical spine with and without contrast: stable nonenhancing cord lesion at C4 11/14/12: MRI brain with and without contrast.  No change 11/14/12:  MRI cervical spine with and without contrast:  No change 12/02/13: MRI brain with and without contrast:  1. No substantial change in the right frontal juxtacortical, right corona radiata, and left periatrial white matter T2/FLAIR hyperintensities. While nonspecific, these likely reflect demyelinating lesions in this patient with a history of multiple sclerosis and concomitant cervical cord lesion. No new lesions are identified and none of the existing lesions enhance or restrict diffusion.  2. Subcentimeter left parafalcine  dural based lesion as described above has slowly enlarged over time and demonstrates equivocal enhancement, but may represent a meningioma.  Recommend attention on follow up imaging. 12/02/13:  MRI cervical spine with and without contrast:  No  change 12/15/14:  MRI brain with and without contrast:  1.  Similar appearance of focal T2/FLAIR hyperintense white matter lesion within the right lateral body of the corpus callosum without associated enhancement to suggest active demyelination.  2.  No new or enlarging white matter lesions identified.  3.  Slowly enlarging nonenhancing nodular lesion along the left anterior aspect of the falx cerebri, which may reflect a small meningioma. 12/15/14:  MRI cervical spine with and without contrast:  no change 10/10/15:  MRI brain with and without contrast:  no change 10/10/15:  MRI cervical spine with and without contrast:  no change    PAST MEDICAL HISTORY: Past Medical History:  Diagnosis Date  . ALLERGIC RHINITIS 03/13/2007  . Allergy   . Anemia, B12 deficiency   . Anxiety   . C. difficile colitis   . CARPAL TUNNEL SYNDROME, BILATERAL 01/13/2010  . Cervical lesion 07/13/2011   Spine lesion - indeterminate, for f/u MRI may 2013 per neurology  . Chronic fatigue   . CTS (carpal tunnel syndrome)   . Diabetes mellitus without complication ([place])   . ESSENTIAL HYPERTENSION 03/13/2007  . Fibromyalgia   . GERD (gastroesophageal reflux disease)   . Heart murmur   . Hx of adenomatous polyp of colon 06/09/2009  . Hyperlipidemia   . HYPERLIPIDEMIA 03/13/2007  . Irritable bowel syndrome 05/24/2010  . Multiple sclerosis ([place])   . OBSTRUCTIVE SLEEP APNEA 12/08/2008  . Sleep apnea    uses c pap  . VITAMIN D DEFICIENCY 01/13/2010    MEDICATIONS: Current Outpatient Medications on File Prior to Visit  Medication Sig Dispense Refill  . atorvastatin (LIPITOR) 20 MG tablet Take 1 tablet (20 mg total) by mouth daily. 90 tablet 0  . baclofen (LIORESAL) 10 MG tablet Take 10 mg by mouth daily at 2 PM daily at 2 PM.    . cetirizine (ZYRTEC) 10 MG tablet Take 10 mg by mouth daily.      . Cholecalciferol (VITAMIN D) 2000 UNITS CAPS Take 2 capsules by mouth every morning. Reported on 06/05/2015    . clotrimazole  (MYCELEX) 10 MG troche Take 1 tablet (10 mg total) by mouth 5 (five) times daily. 35 tablet 2  . cyanocobalamin (,VITAMIN B-12,) 1000 MCG/ML injection Inject into the muscle every 30 (thirty) days.    Marland Kitchen dalfampridine (AMPYRA) 10 MG TB12 Take 10 mg by mouth 2 (two) times daily.    . furosemide (LASIX) 40 MG tablet TAKE 1 TABLET DAILY 90 tablet 0  . gabapentin (NEURONTIN) 300 MG capsule TAKE 1 CAPSULE THREE TIMES A DAY 270 capsule 0  . glucose blood (FREESTYLE TEST STRIPS) test strip Use to check blood sugar 2 times daily. 150 each 2  . glucose monitoring kit (FREESTYLE) monitoring kit 1 each by Does not apply route as needed for other. Dispense any model that is covered- dispense testing supplies for Q AC/ HS accuchecks- 1 month supply with one refil. 1 each 1  . insulin lispro (HUMALOG KWIKPEN) 100 UNIT/ML KiwkPen 3 times a day (just before each meal), 35-35-40 units, And pen needles 3/day 30 pen 3  . INSULIN SYRINGE .5CC/28G 28G X 1/2" 0.5 ML MISC 1 each by Does not apply route 4 (four) times daily -  before meals and at  bedtime. 100 each 6  . JANUVIA 100 MG tablet TAKE 1 TABLET DAILY 90 tablet 2  . Lancets (FREESTYLE) lancets Use to check blood sugar two time daily. 150 each 2  . loperamide (IMODIUM) 2 MG capsule Take 2 mg by mouth as needed for diarrhea or loose stools.    . medroxyPROGESTERone (DEPO-PROVERA) 150 MG/ML injection Inject 150 mg into the muscle every 3 (three) months.      . metFORMIN (GLUCOPHAGE-XR) 500 MG 24 hr tablet TAKE 1 TABLET TWICE A DAY 180 tablet 0  . metoprolol tartrate (LOPRESSOR) 100 MG tablet TAKE 2 TABLETS DAILY 180 tablet 0  . nortriptyline (PAMELOR) 50 MG capsule Take 100 mg by mouth at bedtime. At bedtime    . omeprazole-sodium bicarbonate (ZEGERID) 40-1100 MG per capsule Take 1 capsule by mouth daily.      . potassium chloride (K-DUR,KLOR-CON) 10 MEQ tablet TAKE 1 TABLET DAILY 90 tablet 0  . Teriflunomide (AUBAGIO) 14 MG TABS Take 14 mg by mouth daily.    .  traMADol (ULTRAM) 50 MG tablet Take 1 tablet (50 mg total) by mouth every 6 (six) hours as needed. 20 tablet 2   No current facility-administered medications on file prior to visit.     ALLERGIES: Allergies  Allergen Reactions  . Ciprofloxacin In D5w Rash  . Penicillins     REACTION: rash  . Tricor [Fenofibrate] Rash    FAMILY HISTORY: Family History  Problem Relation Age of Onset  . Migraines Mother   . Hypertension Father   . COPD Father   . Diabetes Father   . Prostate cancer Maternal Uncle        prostate cancer  . Lung cancer Maternal Grandmother   . Stroke Maternal Grandfather   . Heart attack Maternal Grandfather   . Diabetes Other   . Hypertension Other   . Asthma Daughter   . Colon cancer Neg Hx   . Esophageal cancer Neg Hx   . Rectal cancer Neg Hx   . Stomach cancer Neg Hx     SOCIAL HISTORY: Social History   Socioeconomic History  . Marital status: Married    Spouse name: Not on file  . Number of children: 2  . Years of education: Not on file  . Highest education level: Not on file  Occupational History  . 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 Financial resource strain: Not on file  . Food insecurity:    Worry: Not on file    Inability: Not on file  . Transportation needs:    Medical: Not on file    Non-medical: Not on file  Tobacco Use  . Smoking status: Never Smoker  . Smokeless tobacco: Never Used  Substance and Sexual Activity  . Alcohol use: No    Alcohol/week: 0.0 oz  . Drug use: No  . Sexual activity: Not on file  Lifestyle  . Physical activity:    Days per week: Not on file    Minutes per session: Not on file  . Stress: Not on file  Relationships  . Social connections:    Talks on phone: Not on file    Gets together: Not on file    Attends religious service: Not on file    Active member of club or organization: Not on file    Attends meetings of clubs or organizations: Not on file     Relationship status: Not on file  . Intimate  partner violence:    Fear of current or ex partner: Not on file    Emotionally abused: Not on file    Physically abused: Not on file    Forced sexual activity: Not on file  Other Topics Concern  . Not on file  Social History Narrative   Daily Caffeine    REVIEW OF SYSTEMS: Constitutional: No fevers, chills, or sweats, no generalized fatigue, change in appetite Eyes: No visual changes, double vision, eye pain Ear, nose and throat: No hearing loss, ear pain, nasal congestion, sore throat Cardiovascular: No chest pain, palpitations Respiratory:  No shortness of breath at rest or with exertion, wheezes GastrointestinaI: diarrhea Genitourinary:  No dysuria, urinary retention or frequency Musculoskeletal:  No neck pain, back pain Integumentary: No rash, pruritus, skin lesions Neurological: as above Psychiatric: No depression, insomnia, anxiety Endocrine: No palpitations, fatigue, diaphoresis, mood swings, change in appetite, change in weight, increased thirst Hematologic/Lymphatic:  No purpura, petechiae. Allergic/Immunologic: no itchy/runny eyes, nasal congestion, recent allergic reactions, rashes  PHYSICAL EXAM: Vitals:   07/28/17 0928  BP: 136/78  Pulse: 88  SpO2: 99%   General: No acute distress.  Patient appears well-groomed.   Head:  Normocephalic/atraumatic Eyes:  Fundi examined but not visualized Neck: supple, no paraspinal tenderness, full range of motion Heart:  Regular rate and rhythm Lungs:  Clear to auscultation bilaterally Back: No paraspinal tenderness Neurological Exam: alert and oriented to person, place, and time. Attention span and concentration intact, recent and remote memory intact, fund of knowledge intact.  Speech fluent and not dysarthric, language intact.  CN II-XII intact. Bulk and tone normal, muscle strength 5/5 throughout.  Sensation to light touch, temperature and vibration intact.  Deep tendon reflexes 2+  throughout, toes downgoing.  Finger to nose and heel to shin testing intact.  Gait with slight right limp due to foot pain.  Timed 25 foot walk 8.58 seconds, Romberg negative.  IMPRESSION: Multiple sclerosis  PLAN: 1.  Due to side effects, we discussed alternative DMT, such as Tysabri and Gilenya.  At this time, she wishes to remain on Aubagio. 2.  Check CBC with diff, hepatic panel and vitamin D level today and repeat in 6 months about a week prior to follow up.  3.  Continue D3 2000 IU daily 4.  Follow up in 6 months.  25 minutes spent face to face with patient, over 50% spent discussing management.    Metta Clines, DO  CC:  Cathlean Cower, MD

## 2017-07-28 NOTE — Patient Instructions (Addendum)
1.  Continue Aubagio.  Let me know how you are doing. 2.  Check CBC with diff, hepatic panel and vitamin D level today and repeat in 6 months about a week prior to follow up. Your provider has requested that you have labwork completed today. Please go to [facility] ([id number]) on the second floor of this building before leaving the office today. You do not need to check in. If you are not called within 15 minutes please check with the front desk.  3.  Continue D3 2000 IU daily 4.  Follow up in 6 months.

## 2017-07-31 ENCOUNTER — Telehealth: Payer: Self-pay

## 2017-07-31 LAB — CBC WITH DIFFERENTIAL/PLATELET
BASOS ABS: 62 {cells}/uL (ref 0–200)
Basophils Relative: 0.9 %
EOS ABS: 366 {cells}/uL (ref 15–500)
Eosinophils Relative: 5.3 %
HCT: 34 % — ABNORMAL LOW (ref 35.0–45.0)
HEMOGLOBIN: 11.3 g/dL — AB (ref 11.7–15.5)
Lymphs Abs: 1332 cells/uL (ref 850–3900)
MCH: 26.1 pg — AB (ref 27.0–33.0)
MCHC: 33.2 g/dL (ref 32.0–36.0)
MCV: 78.5 fL — ABNORMAL LOW (ref 80.0–100.0)
MONOS PCT: 6.1 %
MPV: 10.5 fL (ref 7.5–12.5)
NEUTROS ABS: 4720 {cells}/uL (ref 1500–7800)
NEUTROS PCT: 68.4 %
Platelets: 326 10*3/uL (ref 140–400)
RBC: 4.33 10*6/uL (ref 3.80–5.10)
RDW: 15.7 % — AB (ref 11.0–15.0)
Total Lymphocyte: 19.3 %
WBC mixed population: 421 cells/uL (ref 200–950)
WBC: 6.9 10*3/uL (ref 3.8–10.8)

## 2017-07-31 LAB — HEPATIC FUNCTION PANEL
AG RATIO: 1.6 (calc) (ref 1.0–2.5)
ALKALINE PHOSPHATASE (APISO): 59 U/L (ref 33–115)
ALT: 14 U/L (ref 6–29)
AST: 14 U/L (ref 10–30)
Albumin: 4.1 g/dL (ref 3.6–5.1)
BILIRUBIN DIRECT: 0.1 mg/dL (ref 0.0–0.2)
BILIRUBIN INDIRECT: 0.2 mg/dL (ref 0.2–1.2)
BILIRUBIN TOTAL: 0.3 mg/dL (ref 0.2–1.2)
Globulin: 2.5 g/dL (calc) (ref 1.9–3.7)
Total Protein: 6.6 g/dL (ref 6.1–8.1)

## 2017-07-31 LAB — VITAMIN D 1,25 DIHYDROXY
VITAMIN D 1, 25 (OH) TOTAL: 33 pg/mL (ref 18–72)
VITAMIN D3 1, 25 (OH): 33 pg/mL

## 2017-07-31 NOTE — Telephone Encounter (Signed)
-----   Message from Pieter Partridge, DO sent at 07/31/2017 11:03 AM EDT ----- Liver function is normal.  White count is normal.  CBC reveals borderline anemia (unrelated to medication) which has been seen off and on in prior labs over the past several years (nothing significant).  The vitamin D level is still on the low end.  I would like her to increase D3 from 2000 IU daily to 4000 IU daily and repeat with repeat labs in 6 months (prior to follow up).

## 2017-07-31 NOTE — Telephone Encounter (Signed)
Called Pt, left detailed message on her VM as is indicated on DPR. I asked Pt to rtrn my call to ensure she rcvd my message and understands.

## 2017-08-01 ENCOUNTER — Encounter: Payer: Self-pay | Admitting: Podiatry

## 2017-08-01 ENCOUNTER — Ambulatory Visit (INDEPENDENT_AMBULATORY_CARE_PROVIDER_SITE_OTHER): Payer: Medicare Other

## 2017-08-01 ENCOUNTER — Ambulatory Visit (INDEPENDENT_AMBULATORY_CARE_PROVIDER_SITE_OTHER): Payer: Medicare Other | Admitting: Podiatry

## 2017-08-01 DIAGNOSIS — M722 Plantar fascial fibromatosis: Secondary | ICD-10-CM

## 2017-08-01 DIAGNOSIS — M7661 Achilles tendinitis, right leg: Secondary | ICD-10-CM | POA: Diagnosis not present

## 2017-08-01 MED ORDER — MELOXICAM 15 MG PO TABS: 15.0000 mg | ORAL_TABLET | Freq: Every day | ORAL | 3 refills | Status: DC

## 2017-08-01 NOTE — Progress Notes (Signed)
Subjective:  Patient ID: Courtney [name], female    DOB: 1975-05-30,  MRN: [id number] HPI Chief Complaint  Patient presents with  . Foot Pain    Posterior heel right - aching x 1 month, burning radiates medial and lateral sides, swelling, no treatment  . Diabetes    Last a1c was 7.0   . New Patient (Initial Visit)    42 y.o. female presents with the above complaint.   History of multiple sclerosis.  Review of systems denies fever chills nausea vomiting muscle aches pains calf pain back pain chest pain shortness of breath.  Past Medical History:  Diagnosis Date  . ALLERGIC RHINITIS 03/13/2007  . Allergy   . Anemia, B12 deficiency   . Anxiety   . C. difficile colitis   . CARPAL TUNNEL SYNDROME, BILATERAL 01/13/2010  . Cervical lesion 07/13/2011   Spine lesion - indeterminate, for f/u MRI may 2013 per neurology  . Chronic fatigue   . CTS (carpal tunnel syndrome)   . Diabetes mellitus without complication ([place])   . ESSENTIAL HYPERTENSION 03/13/2007  . Fibromyalgia   . GERD (gastroesophageal reflux disease)   . Heart murmur   . Hx of adenomatous polyp of colon 06/09/2009  . Hyperlipidemia   . HYPERLIPIDEMIA 03/13/2007  . Irritable bowel syndrome 05/24/2010  . Multiple sclerosis ([place])   . OBSTRUCTIVE SLEEP APNEA 12/08/2008  . Sleep apnea    uses c pap  . VITAMIN D DEFICIENCY 01/13/2010   Past Surgical History:  Procedure Laterality Date  . APPENDECTOMY  07/23/12  . CARPAL TUNNEL RELEASE  01/2010   bilateral  . COLONOSCOPY W/ BIOPSIES AND POLYPECTOMY  05/2009   2 tubular adenomas  . LAPAROSCOPIC APPENDECTOMY N/A 07/23/2012   Procedure: APPENDECTOMY LAPAROSCOPIC;  Surgeon: Gayland Curry, MD;  Location: [facility] OR;  Service: General;  Laterality: N/A;    Current Outpatient Medications:  .  atorvastatin (LIPITOR) 20 MG tablet, Take 1 tablet (20 mg total) by mouth daily., Disp: 90 tablet, Rfl: 0 .  baclofen (LIORESAL) 10 MG tablet, Take 10 mg by mouth daily at 2 PM daily at 2 PM., Disp:  , Rfl:  .  cetirizine (ZYRTEC) 10 MG tablet, Take 10 mg by mouth daily.  , Disp: , Rfl:  .  Cholecalciferol (VITAMIN D) 2000 UNITS CAPS, Take 2 capsules by mouth every morning. Reported on 06/05/2015, Disp: , Rfl:  .  clotrimazole (MYCELEX) 10 MG troche, Take 1 tablet (10 mg total) by mouth 5 (five) times daily., Disp: 35 tablet, Rfl: 2 .  cyanocobalamin (,VITAMIN B-12,) 1000 MCG/ML injection, Inject into the muscle every 30 (thirty) days., Disp: , Rfl:  .  dalfampridine (AMPYRA) 10 MG TB12, Take 10 mg by mouth 2 (two) times daily., Disp: , Rfl:  .  furosemide (LASIX) 40 MG tablet, TAKE 1 TABLET DAILY, Disp: 90 tablet, Rfl: 0 .  gabapentin (NEURONTIN) 300 MG capsule, TAKE 1 CAPSULE THREE TIMES A DAY, Disp: 270 capsule, Rfl: 0 .  glucose blood (FREESTYLE TEST STRIPS) test strip, Use to check blood sugar 2 times daily., Disp: 150 each, Rfl: 2 .  glucose monitoring kit (FREESTYLE) monitoring kit, 1 each by Does not apply route as needed for other. Dispense any model that is covered- dispense testing supplies for Q AC/ HS accuchecks- 1 month supply with one refil., Disp: 1 each, Rfl: 1 .  insulin lispro (HUMALOG KWIKPEN) 100 UNIT/ML KiwkPen, 3 times a day (just before each meal), 35-35-40 units, And pen needles 3/day, Disp: 30  pen, Rfl: 3 .  INSULIN SYRINGE .5CC/28G 28G X 1/2" 0.5 ML MISC, 1 each by Does not apply route 4 (four) times daily -  before meals and at bedtime., Disp: 100 each, Rfl: 6 .  JANUVIA 100 MG tablet, TAKE 1 TABLET DAILY, Disp: 90 tablet, Rfl: 2 .  Lancets (FREESTYLE) lancets, Use to check blood sugar two time daily., Disp: 150 each, Rfl: 2 .  loperamide (IMODIUM) 2 MG capsule, Take 2 mg by mouth as needed for diarrhea or loose stools., Disp: , Rfl:  .  medroxyPROGESTERone (DEPO-PROVERA) 150 MG/ML injection, Inject 150 mg into the muscle every 3 (three) months.  , Disp: , Rfl:  .  meloxicam (MOBIC) 15 MG tablet, Take 1 tablet (15 mg total) by mouth daily., Disp: 30 tablet, Rfl: 3 .   metFORMIN (GLUCOPHAGE-XR) 500 MG 24 hr tablet, TAKE 1 TABLET TWICE A DAY, Disp: 180 tablet, Rfl: 0 .  metoprolol tartrate (LOPRESSOR) 100 MG tablet, TAKE 2 TABLETS DAILY, Disp: 180 tablet, Rfl: 0 .  nortriptyline (PAMELOR) 50 MG capsule, Take 100 mg by mouth at bedtime. At bedtime, Disp: , Rfl:  .  omeprazole-sodium bicarbonate (ZEGERID) 40-1100 MG per capsule, Take 1 capsule by mouth daily.  , Disp: , Rfl:  .  potassium chloride (K-DUR,KLOR-CON) 10 MEQ tablet, TAKE 1 TABLET DAILY, Disp: 90 tablet, Rfl: 0 .  Teriflunomide (AUBAGIO) 14 MG TABS, Take 14 mg by mouth daily., Disp: , Rfl:  .  traMADol (ULTRAM) 50 MG tablet, Take 1 tablet (50 mg total) by mouth every 6 (six) hours as needed., Disp: 20 tablet, Rfl: 2  Allergies  Allergen Reactions  . Ciprofloxacin In D5w Rash  . Penicillins     REACTION: rash  . Tricor [Fenofibrate] Rash   Review of Systems Objective:  There were no vitals filed for this visit.  General: Well developed, nourished, in no acute distress, alert and oriented x3   Dermatological: Skin is warm, dry and supple bilateral. Nails x 10 are well maintained; remaining integument appears unremarkable at this time. There are no open sores, no preulcerative lesions, no rash or signs of infection present.  Vascular: Dorsalis Pedis artery and Posterior Tibial artery pedal pulses are 2/4 bilateral with immedate capillary fill time. Pedal hair growth present. No varicosities and no lower extremity edema present bilateral.   Neruologic: Grossly intact via light touch bilateral. Vibratory intact via tuning fork bilateral. Protective threshold with Semmes Wienstein monofilament intact to all pedal sites bilateral. Patellar and Achilles deep tendon reflexes 2+ bilateral. No Babinski or clonus noted bilateral.   Musculoskeletal: No gross boney pedal deformities bilateral. No pain, crepitus, or limitation noted with foot and ankle range of motion bilateral. Muscular strength 5/5 in all  groups tested bilateral.  She has full range of motion at the ankle joint.  She has tenderness on palpation of the Achilles at its insertion site in the posterior aspect of the heel.  There is some fluctuance beneath the skin more than likely bursitis overlying the spur.  No tenderness in palpation of the mid substance of the Achilles.  Gait: Unassisted, Nonantalgic.    Radiographs:  Radiographs of the right foot taken today demonstrate an osseously mature individual no acute findings.  Retrocalcaneal and plantar calcaneal heel spurs are noted.  Mild soft tissue increase in density of the Achilles tendon near its insertion site on the posterior heel spur.  Margins appear to be normal minimal edema or soft tissue swelling.  Assessment & Plan:  Assessment: Insertional Achilles tendinitis right heel.  Multiple sclerosis.  Plan: Discussed etiology pathology conservative versus surgical therapies.  At this point I injected 2 mg of dexamethasone and local anesthetic subcutaneously making sure not to inject into the Achilles tendon just superficial to the tendon itself.  She tolerated procedure well without complications.  Start her on meloxicam.  And placed her in a short Cam walker.  We will follow-up with her in 1 month.     Max T. [place], [place]

## 2017-08-01 NOTE — Patient Instructions (Addendum)
Achilles Tendinitis  with Rehab Achilles tendinitis is a disorder of the Achilles tendon. The Achilles tendon connects the large calf muscles (Gastrocnemius and Soleus) to the heel bone (calcaneus). This tendon is sometimes called the heel cord. It is important for pushing-off and standing on your toes and is important for walking, running, or jumping. Tendinitis is often caused by overuse and repetitive microtrauma. SYMPTOMS  Pain, tenderness, swelling, warmth, and redness may occur over the Achilles tendon even at rest.  Pain with pushing off, or flexing or extending the ankle.  Pain that is worsened after or during activity. CAUSES   Overuse sometimes seen with rapid increase in exercise programs or in sports requiring running and jumping.  Poor physical conditioning (strength and flexibility or endurance).  Running sports, especially training running down hills.  Inadequate warm-up before practice or play or failure to stretch before participation.  Injury to the tendon. PREVENTION   Warm up and stretch before practice or competition.  Allow time for adequate rest and recovery between practices and competition.  Keep up conditioning.  Keep up ankle and leg flexibility.  Improve or keep muscle strength and endurance.  Improve cardiovascular fitness.  Use proper technique.  Use proper equipment (shoes, skates).  To help prevent recurrence, taping, protective strapping, or an adhesive bandage may be recommended for several weeks after healing is complete. PROGNOSIS   Recovery may take weeks to several months to heal.  Longer recovery is expected if symptoms have been prolonged.  Recovery is usually quicker if the inflammation is due to a direct blow as compared with overuse or sudden strain. RELATED COMPLICATIONS   Healing time will be prolonged if the condition is not correctly treated. The injury must be given plenty of time to heal.  Symptoms can reoccur if  activity is resumed too soon.  Untreated, tendinitis may increase the risk of tendon rupture requiring additional time for recovery and possibly surgery. TREATMENT   The first treatment consists of rest anti-inflammatory medication, and ice to relieve the pain.  Stretching and strengthening exercises after resolution of pain will likely help reduce the risk of recurrence. Referral to a physical therapist or athletic trainer for further evaluation and treatment may be helpful.  A walking boot or cast may be recommended to rest the Achilles tendon. This can help break the cycle of inflammation and microtrauma.  Arch supports (orthotics) may be prescribed or recommended by your caregiver as an adjunct to therapy and rest.  Surgery to remove the inflamed tendon lining or degenerated tendon tissue is rarely necessary and has shown less than predictable results. MEDICATION   Nonsteroidal anti-inflammatory medications, such as aspirin and ibuprofen, may be used for pain and inflammation relief. Do not take within 7 days before surgery. Take these as directed by your caregiver. Contact your caregiver immediately if any bleeding, stomach upset, or signs of allergic reaction occur. Other minor pain relievers, such as acetaminophen, may also be used.  Pain relievers may be prescribed as necessary by your caregiver. Do not take prescription pain medication for longer than 4 to 7 days. Use only as directed and only as much as you need.  Cortisone injections are rarely indicated. Cortisone injections may weaken tendons and predispose to rupture. It is better to give the condition more time to heal than to use them. HEAT AND COLD  Cold is used to relieve pain and reduce inflammation for acute and chronic Achilles tendinitis. Cold should be applied for 10 to 15 minutes   every 2 to 3 hours for inflammation and pain and immediately after any activity that aggravates your symptoms. Use ice packs or an ice  massage.  Heat may be used before performing stretching and strengthening activities prescribed by your caregiver. Use a heat pack or a warm soak. SEEK MEDICAL CARE IF:  Symptoms get worse or do not improve in 2 weeks despite treatment.  New, unexplained symptoms develop. Drugs used in treatment may produce side effects.  EXERCISES:  RANGE OF MOTION (ROM) AND STRETCHING EXERCISES - Achilles Tendinitis  These exercises may help you when beginning to rehabilitate your injury. Your symptoms may resolve with or without further involvement from your physician, physical therapist or athletic trainer. While completing these exercises, remember:   Restoring tissue flexibility helps normal motion to return to the joints. This allows healthier, less painful movement and activity.  An effective stretch should be held for at least 30 seconds.  A stretch should never be painful. You should only feel a gentle lengthening or release in the stretched tissue.  STRETCH  Gastroc, Standing   Place hands on wall.  Extend right / left leg, keeping the front knee somewhat bent.  Slightly point your toes inward on your back foot.  Keeping your right / left heel on the floor and your knee straight, shift your weight toward the wall, not allowing your back to arch.  You should feel a gentle stretch in the right / left calf. Hold this position for 10 seconds. Repeat 3 times. Complete this stretch 2 times per day.  STRETCH  Soleus, Standing   Place hands on wall.  Extend right / left leg, keeping the other knee somewhat bent.  Slightly point your toes inward on your back foot.  Keep your right / left heel on the floor, bend your back knee, and slightly shift your weight over the back leg so that you feel a gentle stretch deep in your back calf.  Hold this position for 10 seconds. Repeat 3 times. Complete this stretch 2 times per day.  STRETCH  Gastrocsoleus, Standing  Note: This exercise can place  a lot of stress on your foot and ankle. Please complete this exercise only if specifically instructed by your caregiver.   Place the ball of your right / left foot on a step, keeping your other foot firmly on the same step.  Hold on to the wall or a rail for balance.  Slowly lift your other foot, allowing your body weight to press your heel down over the edge of the step.  You should feel a stretch in your right / left calf.  Hold this position for 10 seconds.  Repeat this exercise with a slight bend in your knee. Repeat 3 times. Complete this stretch 2 times per day.   STRENGTHENING EXERCISES - Achilles Tendinitis These exercises may help you when beginning to rehabilitate your injury. They may resolve your symptoms with or without further involvement from your physician, physical therapist or athletic trainer. While completing these exercises, remember:   Muscles can gain both the endurance and the strength needed for everyday activities through controlled exercises.  Complete these exercises as instructed by your physician, physical therapist or athletic trainer. Progress the resistance and repetitions only as guided.  You may experience muscle soreness or fatigue, but the pain or discomfort you are trying to eliminate should never worsen during these exercises. If this pain does worsen, stop and make certain you are following the directions exactly. If   the pain is still present after adjustments, discontinue the exercise until you can discuss the trouble with your clinician.  STRENGTH - Plantar-flexors   Sit with your right / left leg extended. Holding onto both ends of a rubber exercise band/tubing, loop it around the ball of your foot. Keep a slight tension in the band.  Slowly push your toes away from you, pointing them downward.  Hold this position for 10 seconds. Return slowly, controlling the tension in the band/tubing. Repeat 3 times. Complete this exercise 2 times per day.     STRENGTH - Plantar-flexors   Stand with your feet shoulder width apart. Steady yourself with a wall or table using as little support as needed.  Keeping your weight evenly spread over the width of your feet, rise up on your toes.*  Hold this position for 10 seconds. Repeat 3 times. Complete this exercise 2 times per day.  *If this is too easy, shift your weight toward your right / left leg until you feel challenged. Ultimately, you may be asked to do this exercise with your right / left foot only.  STRENGTH  Plantar-flexors, Eccentric  Note: This exercise can place a lot of stress on your foot and ankle. Please complete this exercise only if specifically instructed by your caregiver.   Place the balls of your feet on a step. With your hands, use only enough support from a wall or rail to keep your balance.  Keep your knees straight and rise up on your toes.  Slowly shift your weight entirely to your right / left toes and pick up your opposite foot. Gently and with controlled movement, lower your weight through your right / left foot so that your heel drops below the level of the step. You will feel a slight stretch in the back of your calf at the end position.  Use the healthy leg to help rise up onto the balls of both feet, then lower weight only on the right / left leg again. Build up to 15 repetitions. Then progress to 3 consecutive sets of 15 repetitions.*  After completing the above exercise, complete the same exercise with a slight knee bend (about 30 degrees). Again, build up to 15 repetitions. Then progress to 3 consecutive sets of 15 repetitions.* Perform this exercise 2 times per day.  *When you easily complete 3 sets of 15, your physician, physical therapist or athletic trainer may advise you to add resistance by wearing a backpack filled with additional weight.  STRENGTH - Plantar Flexors, Seated   Sit on a chair that allows your feet to rest flat on the ground. If  necessary, sit at the edge of the chair.  Keeping your toes firmly on the ground, lift your right / left heel as far as you can without increasing any discomfort in your ankle. Repeat 3 times. Complete this exercise 2 times a day.   Achilles Tendinitis Rehab Ask your health care provider which exercises are safe for you. Do exercises exactly as told by your health care provider and adjust them as directed. It is normal to feel mild stretching, pulling, tightness, or discomfort as you do these exercises, but you should stop right away if you feel sudden pain or your pain gets worse. Do not begin these exercises until told by your health care provider. Stretching and range of motion exercises These exercises warm up your muscles and joints and improve the movement and flexibility of your ankle. These exercises also help to  relieve pain, numbness, and tingling. Exercise A: Standing wall calf stretch, knee straight  1. Stand with your hands against a wall. 2. Extend your __________ leg behind you and bend your front knee slightly. Keep both of your heels on the floor. 3. Point the toes of your back foot slightly inward. 4. Keeping your heels on the floor and your back knee straight, shift your weight toward the wall. Do not allow your back to arch. You should feel a gentle stretch in your calf. 5. Hold this position for seconds. Repeat __________ times. Complete this stretch __________ times per day. Exercise B: Standing wall calf stretch, knee bent 1. Stand with your hands against a wall. 2. Extend your __________ leg behind you, and bend your front knee slightly. Keep both of your heels on the floor. 3. Point the toes of your back foot slightly inward. 4. Keeping your heels on the floor, unlock your back knee so that it is bent. You should feel a gentle stretch deep in your calf. 5. Hold this position for __________ seconds. Repeat __________ times. Complete this stretch __________ times per  day. Strengthening exercises These exercises build strength and control of your ankle. Endurance is the ability to use your muscles for a long time, even after they get tired. Exercise C: Plantar flexion with band  1. Sit on the floor with your __________ leg extended. You may put a pillow under your calf to give your foot more room to move. 2. Loop a rubber exercise band or tube around the ball of your __________ foot. The ball of your foot is on the walking surface, right under your toes. The band or tube should be slightly tense when your foot is relaxed. If the band or tube slips, you can put on your shoe or put a washcloth between the band and your foot to help it stay in place. 3. Slowly point your toes downward, pushing them away from you. 4. Hold this position for __________ seconds. 5. Slowly release the tension in the band or tube, controlling smoothly until your foot is back to the starting position. Repeat __________ times. Complete this exercise __________ times per day. Exercise D: Heel raise with eccentric lower  1. Stand on a step with the balls of your feet. The ball of your foot is on the walking surface, right under your toes. ? Do not put your heels on the step. ? For balance, rest your hands on the wall or on a railing. 2. Rise up onto the balls of your feet. 3. Keeping your heels up, shift all of your weight to your __________ leg and pick up your other leg. 4. Slowly lower your __________ leg so your heel drops below the level of the step. 5. Put down your foot. If told by your health care provider, build up to:  3 sets of 15 repetitions while keeping your knees straight.  3 sets of 15 repetitions while keeping your knees bent as far as told by your health care provider.  Complete this exercise __________ times per day. If this exercise is too easy, try doing it while wearing a backpack with weights in it. Balance exercises These exercises improve or maintain your  balance. Balance is important in preventing falls. Exercise E: Single leg stand 1. Without shoes, stand near a railing or in a door frame. Hold on to the railing or door frame as needed. 2. Stand on your __________ foot. Keep your big toe down on  the floor and try to keep your arch lifted. 3. Hold this position for __________ seconds. Repeat __________ times. Complete this exercise __________ times per day. If this exercise is too easy, you can try it with your eyes closed or while standing on a pillow. This information is not intended to replace advice given to you by your health care provider. Make sure you discuss any questions you have with your health care provider. Document Released: 11/03/2004 Document Revised: 12/10/2015 Document Reviewed: 12/09/2014 Elsevier Interactive Patient Education  [employer].

## 2017-08-02 ENCOUNTER — Encounter: Payer: Self-pay | Admitting: Neurology

## 2017-08-02 NOTE — Telephone Encounter (Signed)
Have been unable to reach Pt by telephone. Sending letter to inform of lab results and recommendation to increase D3, and to repeat labs in 6 months.

## 2017-08-11 ENCOUNTER — Encounter: Payer: Self-pay | Admitting: Neurology

## 2017-08-13 ENCOUNTER — Other Ambulatory Visit: Payer: Self-pay | Admitting: Internal Medicine

## 2017-08-15 ENCOUNTER — Ambulatory Visit: Payer: Medicare Other

## 2017-09-07 ENCOUNTER — Encounter: Payer: Self-pay | Admitting: Podiatry

## 2017-09-07 ENCOUNTER — Ambulatory Visit (INDEPENDENT_AMBULATORY_CARE_PROVIDER_SITE_OTHER): Payer: Medicare Other | Admitting: Podiatry

## 2017-09-07 DIAGNOSIS — M7661 Achilles tendinitis, right leg: Secondary | ICD-10-CM | POA: Diagnosis not present

## 2017-09-07 NOTE — Progress Notes (Signed)
She presents today for follow-up of her Achilles tendinitis to her right foot.  She is been wearing her cam walker faithfully.  Stating that it is "feeling much better".  She denies calf pain chest pain shortness of breath.  Objective: Vital signs are stable she is alert and oriented x3 very little in the way of tenderness of palpation of the Achilles just at the insertion site of the right heel.  It is mildly warm to touch.  Much much better than it was previously.  Assessment: Resolving Achilles tendinitis right.  Plan: I recommended that she is try to get back into her regular tennis shoe and use her boot on an as-needed basis.  I am going to request that she continue the use of her night splint which was dispensed today for the next 4 weeks.

## 2017-09-25 ENCOUNTER — Other Ambulatory Visit: Payer: Self-pay | Admitting: Internal Medicine

## 2017-09-29 ENCOUNTER — Other Ambulatory Visit: Payer: Self-pay | Admitting: *Deleted

## 2017-09-29 ENCOUNTER — Encounter: Payer: Self-pay | Admitting: Neurology

## 2017-09-29 MED ORDER — NORTRIPTYLINE HCL 50 MG PO CAPS: 100.0000 mg | ORAL_CAPSULE | Freq: Every day | ORAL | 3 refills | Status: DC

## 2017-10-10 ENCOUNTER — Ambulatory Visit: Payer: Medicare Other | Admitting: Podiatry

## 2017-10-23 ENCOUNTER — Ambulatory Visit (INDEPENDENT_AMBULATORY_CARE_PROVIDER_SITE_OTHER): Payer: Medicare Other

## 2017-10-23 DIAGNOSIS — Z309 Encounter for contraceptive management, unspecified: Secondary | ICD-10-CM | POA: Diagnosis not present

## 2017-10-23 DIAGNOSIS — E538 Deficiency of other specified B group vitamins: Secondary | ICD-10-CM

## 2017-10-23 LAB — POCT URINE PREGNANCY: Preg Test, Ur: NEGATIVE

## 2017-10-23 MED ORDER — MEDROXYPROGESTERONE ACETATE 150 MG/ML IM SUSP
150.0000 mg | Freq: Once | INTRAMUSCULAR | Status: AC
  Administered 2017-10-23: 150 mg via INTRAMUSCULAR

## 2017-10-23 MED ORDER — MEDROXYPROGESTERONE ACETATE 150 MG/ML IM SUSP: 150.0000 mg | Freq: Once | INTRAMUSCULAR | Status: DC

## 2017-10-23 MED ORDER — CYANOCOBALAMIN 1000 MCG/ML IJ SOLN
1000.0000 ug | Freq: Once | INTRAMUSCULAR | Status: AC
  Administered 2017-10-23: 1000 ug via INTRAMUSCULAR

## 2017-10-31 ENCOUNTER — Ambulatory Visit: Payer: Medicare Other | Admitting: Endocrinology

## 2017-11-21 ENCOUNTER — Other Ambulatory Visit: Payer: Self-pay | Admitting: Internal Medicine

## 2017-11-23 ENCOUNTER — Ambulatory Visit (INDEPENDENT_AMBULATORY_CARE_PROVIDER_SITE_OTHER): Payer: Medicare Other | Admitting: *Deleted

## 2017-11-23 DIAGNOSIS — E538 Deficiency of other specified B group vitamins: Secondary | ICD-10-CM

## 2017-11-23 MED ORDER — CYANOCOBALAMIN 1000 MCG/ML IJ SOLN
1000.0000 ug | Freq: Once | INTRAMUSCULAR | Status: AC
  Administered 2017-11-23: 1000 ug via INTRAMUSCULAR

## 2017-11-23 NOTE — Progress Notes (Signed)
Medical screening examination/treatment/procedure(s) were performed by non-physician practitioner and as supervising physician I was immediately available for consultation/collaboration. I agree with above. James John, MD   

## 2017-12-06 ENCOUNTER — Encounter: Payer: Self-pay | Admitting: Internal Medicine

## 2017-12-06 ENCOUNTER — Other Ambulatory Visit: Payer: Self-pay | Admitting: Endocrinology

## 2017-12-06 ENCOUNTER — Other Ambulatory Visit: Payer: Self-pay | Admitting: Internal Medicine

## 2017-12-06 MED ORDER — METFORMIN HCL ER 500 MG PO TB24: 500.0000 mg | ORAL_TABLET | Freq: Two times a day (BID) | ORAL | 0 refills | Status: DC

## 2017-12-06 MED ORDER — METOPROLOL TARTRATE 100 MG PO TABS
200.0000 mg | ORAL_TABLET | Freq: Every day | ORAL | 0 refills | Status: DC
Start: 2017-12-06 — End: 2018-03-22

## 2017-12-12 ENCOUNTER — Ambulatory Visit (INDEPENDENT_AMBULATORY_CARE_PROVIDER_SITE_OTHER): Payer: Medicare Other | Admitting: Endocrinology

## 2017-12-12 ENCOUNTER — Encounter: Payer: Self-pay | Admitting: Endocrinology

## 2017-12-12 VITALS — BP 140/90 | HR 83 | Ht 61.0 in | Wt 207.4 lb

## 2017-12-12 DIAGNOSIS — Z794 Long term (current) use of insulin: Secondary | ICD-10-CM | POA: Diagnosis not present

## 2017-12-12 DIAGNOSIS — E119 Type 2 diabetes mellitus without complications: Secondary | ICD-10-CM | POA: Diagnosis not present

## 2017-12-12 MED ORDER — SITAGLIPTIN PHOSPHATE 100 MG PO TABS: 100.0000 mg | ORAL_TABLET | Freq: Every day | ORAL | 4 refills | Status: DC

## 2017-12-12 MED ORDER — GLUCOSE BLOOD VI STRP: 1.0000 | ORAL_STRIP | Freq: Two times a day (BID) | 3 refills | Status: DC

## 2017-12-12 MED ORDER — METFORMIN HCL ER 500 MG PO TB24: 1000.0000 mg | ORAL_TABLET | Freq: Every day | ORAL | 3 refills | Status: DC

## 2017-12-12 NOTE — Progress Notes (Signed)
Subjective:    Patient ID: Courtney [name], female    DOB: [birth year], 42 y.o.   MRN: [id number]  HPI Pt returns for f/u of diabetes mellitus: DM type: Insulin-requiring type 2 Dx'ed: 2016, when she presented with nonketotic hyperosmolar hyperglycemic state, but not DKA.   Complications: none Therapy: insulin since 2018, and 2 oral meds. GDM: never. DKA: never. Severe hypoglycemia: never.  Pancreatitis: never.   Other: she takes multiple daily injections; metformin dosage is limited by nausea.   She declines weight loss surgery. she brings a record of her cbg's which I have reviewed today.  It varies from 99-190.  It is in general highest fasting (higher than at HS) Pt takes meds as rx'ed. Past Medical History:  Diagnosis Date  . ALLERGIC RHINITIS 03/13/2007  . Allergy   . Anemia, B12 deficiency   . Anxiety   . C. difficile colitis   . CARPAL TUNNEL SYNDROME, BILATERAL 01/13/2010  . Cervical lesion 07/13/2011   Spine lesion - indeterminate, for f/u MRI may 2013 per neurology  . Chronic fatigue   . CTS (carpal tunnel syndrome)   . Diabetes mellitus without complication ([place])   . ESSENTIAL HYPERTENSION 03/13/2007  . Fibromyalgia   . GERD (gastroesophageal reflux disease)   . Heart murmur   . Hx of adenomatous polyp of colon 06/09/2009  . Hyperlipidemia   . HYPERLIPIDEMIA 03/13/2007  . Irritable bowel syndrome 05/24/2010  . Multiple sclerosis ([place])   . OBSTRUCTIVE SLEEP APNEA 12/08/2008  . Sleep apnea    uses c pap  . VITAMIN D DEFICIENCY 01/13/2010    Past Surgical History:  Procedure Laterality Date  . APPENDECTOMY  07/23/12  . CARPAL TUNNEL RELEASE  01/2010   bilateral  . COLONOSCOPY W/ BIOPSIES AND POLYPECTOMY  05/2009   2 tubular adenomas  . LAPAROSCOPIC APPENDECTOMY N/A 07/23/2012   Procedure: APPENDECTOMY LAPAROSCOPIC;  Surgeon: Gayland Curry, MD;  Location: [facility] OR;  Service: General;  Laterality: N/A;    Social History   Socioeconomic History  . Marital status:  Married    Spouse name: Not on file  . Number of children: 2  . Years of education: Not on file  . Highest education level: Not on file  Occupational History  . 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 Financial resource strain: Not on file  . Food insecurity:    Worry: Not on file    Inability: Not on file  . Transportation needs:    Medical: Not on file    Non-medical: Not on file  Tobacco Use  . Smoking status: Never Smoker  . Smokeless tobacco: Never Used  Substance and Sexual Activity  . Alcohol use: No    Alcohol/week: 0.0 standard drinks  . Drug use: No  . Sexual activity: Not on file  Lifestyle  . Physical activity:    Days per week: Not on file    Minutes per session: Not on file  . Stress: Not on file  Relationships  . Social connections:    Talks on phone: Not on file    Gets together: Not on file    Attends religious service: Not on file    Active member of club or organization: Not on file    Attends meetings of clubs or organizations: Not on file    Relationship status: Not on file  . Intimate partner violence:    Fear of current or ex partner: Not on  file    Emotionally abused: Not on file    Physically abused: Not on file    Forced sexual activity: Not on file  Other Topics Concern  . Not on file  Social History Narrative   Daily Caffeine    Current Outpatient Medications on File Prior to Visit  Medication Sig Dispense Refill  . atorvastatin (LIPITOR) 20 MG tablet TAKE 1 TABLET DAILY 90 tablet 0  . B-D UF III MINI PEN NEEDLES 31G X 5 MM MISC     . baclofen (LIORESAL) 10 MG tablet Take 10 mg by mouth daily at 2 PM daily at 2 PM.    . cetirizine (ZYRTEC) 10 MG tablet Take 10 mg by mouth daily.      . Cholecalciferol (VITAMIN D) 2000 UNITS CAPS Take 2 capsules by mouth every morning. Reported on 06/05/2015    . clotrimazole (MYCELEX) 10 MG troche Take 1 tablet (10 mg total) by mouth 5 (five) times daily. 35 tablet 2   . cyanocobalamin (,VITAMIN B-12,) 1000 MCG/ML injection Inject into the muscle every 30 (thirty) days.    Marland Kitchen dalfampridine (AMPYRA) 10 MG TB12 Take 10 mg by mouth 2 (two) times daily.    . furosemide (LASIX) 40 MG tablet TAKE 1 TABLET DAILY 90 tablet 0  . gabapentin (NEURONTIN) 300 MG capsule TAKE 1 CAPSULE THREE TIMES A DAY 270 capsule 0  . glucose monitoring kit (FREESTYLE) monitoring kit 1 each by Does not apply route as needed for other. Dispense any model that is covered- dispense testing supplies for Q AC/ HS accuchecks- 1 month supply with one refil. 1 each 1  . insulin lispro (HUMALOG KWIKPEN) 100 UNIT/ML KiwkPen 3 times a day (just before each meal), 35-35-40 units, And pen needles 3/day 30 pen 3  . Lancets (FREESTYLE) lancets Use to check blood sugar two time daily. 150 each 2  . loperamide (IMODIUM) 2 MG capsule Take 2 mg by mouth as needed for diarrhea or loose stools.    . medroxyPROGESTERone (DEPO-PROVERA) 150 MG/ML injection Inject 150 mg into the muscle every 3 (three) months.      . meloxicam (MOBIC) 15 MG tablet Take 1 tablet (15 mg total) by mouth daily. 30 tablet 3  . metoprolol tartrate (LOPRESSOR) 100 MG tablet Take 2 tablets (200 mg total) by mouth daily. 180 tablet 0  . nortriptyline (PAMELOR) 50 MG capsule Take 2 capsules (100 mg total) by mouth at bedtime. At bedtime 180 capsule 3  . omeprazole-sodium bicarbonate (ZEGERID) 40-1100 MG per capsule Take 1 capsule by mouth daily.      . potassium chloride (K-DUR,KLOR-CON) 10 MEQ tablet TAKE 1 TABLET DAILY 90 tablet 0  . Teriflunomide (AUBAGIO) 14 MG TABS Take 14 mg by mouth daily.    . traMADol (ULTRAM) 50 MG tablet Take 1 tablet (50 mg total) by mouth every 6 (six) hours as needed. 20 tablet 2   No current facility-administered medications on file prior to visit.     Allergies  Allergen Reactions  . Ciprofloxacin In D5w Rash  . Penicillins     REACTION: rash  . Tricor [Fenofibrate] Rash    Family History  Problem  Relation Age of Onset  . Migraines Mother   . Hypertension Father   . COPD Father   . Diabetes Father   . Prostate cancer Maternal Uncle        prostate cancer  . Lung cancer Maternal Grandmother   . Stroke Maternal Grandfather   . Heart  attack Maternal Grandfather   . Diabetes Other   . Hypertension Other   . Asthma Daughter   . Colon cancer Neg Hx   . Esophageal cancer Neg Hx   . Rectal cancer Neg Hx   . Stomach cancer Neg Hx     BP 140/90 (BP Location: Left Arm, Patient Position: Sitting, Cuff Size: Normal)   Pulse 83   Ht '5\' 1"'  (1.549 m)   Wt 207 lb 6.4 oz (94.1 kg)   SpO2 97%   BMI 39.19 kg/m    Review of Systems She denies hypoglycemia    Objective:   Physical Exam VITAL SIGNS:  See vs page GENERAL: no distress Pulses: foot pulses are intact bilaterally.   MSK: no deformity of the feet or ankles.  CV: no edema of the legs or ankles Skin:  no ulcer on the feet or ankles.  normal color and temp on the feet and ankles Neuro: sensation is intact to touch on the feet and ankles.     Lab Results  Component Value Date   HGBA1C 7.0 07/18/2017      Assessment & Plan:  Insulin-requiring type 2 DM: Based on the pattern of her cbg's, she needs some adjustment in her therapy  Patient Instructions  check your blood sugar twice a day.  vary the time of day when you check, between before the 3 meals, and at bedtime.  also check if you have symptoms of your blood sugar being too high or too low.  please keep a record of the readings and bring it to your next appointment here.  You can write it on any piece of paper.   Try taking all of the diabetes pills in the evening Please continue the same insulin. Please come back for a follow-up appointment in 3-4 months.

## 2017-12-12 NOTE — Patient Instructions (Addendum)
check your blood sugar twice a day.  vary the time of day when you check, between before the 3 meals, and at bedtime.  also check if you have symptoms of your blood sugar being too high or too low.  please keep a record of the readings and bring it to your next appointment here.  You can write it on any piece of paper.   Try taking all of the diabetes pills in the evening Please continue the same insulin. Please come back for a follow-up appointment in 3-4 months.

## 2018-01-03 ENCOUNTER — Encounter: Payer: Self-pay | Admitting: Internal Medicine

## 2018-01-03 ENCOUNTER — Ambulatory Visit (INDEPENDENT_AMBULATORY_CARE_PROVIDER_SITE_OTHER): Payer: Medicare Other | Admitting: Internal Medicine

## 2018-01-03 ENCOUNTER — Other Ambulatory Visit (INDEPENDENT_AMBULATORY_CARE_PROVIDER_SITE_OTHER): Payer: Medicare Other

## 2018-01-03 VITALS — BP 126/88 | HR 86 | Temp 98.7°F | Ht 61.0 in | Wt 212.0 lb

## 2018-01-03 DIAGNOSIS — E559 Vitamin D deficiency, unspecified: Secondary | ICD-10-CM | POA: Diagnosis not present

## 2018-01-03 DIAGNOSIS — E119 Type 2 diabetes mellitus without complications: Secondary | ICD-10-CM

## 2018-01-03 DIAGNOSIS — Z794 Long term (current) use of insulin: Secondary | ICD-10-CM

## 2018-01-03 DIAGNOSIS — E785 Hyperlipidemia, unspecified: Secondary | ICD-10-CM | POA: Diagnosis not present

## 2018-01-03 DIAGNOSIS — Z23 Encounter for immunization: Secondary | ICD-10-CM | POA: Diagnosis not present

## 2018-01-03 DIAGNOSIS — I10 Essential (primary) hypertension: Secondary | ICD-10-CM | POA: Diagnosis not present

## 2018-01-03 DIAGNOSIS — E538 Deficiency of other specified B group vitamins: Secondary | ICD-10-CM

## 2018-01-03 LAB — URINALYSIS, ROUTINE W REFLEX MICROSCOPIC
Hgb urine dipstick: NEGATIVE
NITRITE: NEGATIVE
PH: 6 (ref 5.0–8.0)
Specific Gravity, Urine: 1.025 (ref 1.000–1.030)
Total Protein, Urine: 30 — AB
Urine Glucose: NEGATIVE
Urobilinogen, UA: 0.2 (ref 0.0–1.0)

## 2018-01-03 LAB — CBC WITH DIFFERENTIAL/PLATELET
Basophils Absolute: 0.1 10*3/uL (ref 0.0–0.1)
Basophils Relative: 1 % (ref 0.0–3.0)
EOS ABS: 0.4 10*3/uL (ref 0.0–0.7)
EOS PCT: 5.5 % — AB (ref 0.0–5.0)
HEMATOCRIT: 30.9 % — AB (ref 36.0–46.0)
HEMOGLOBIN: 10.7 g/dL — AB (ref 12.0–15.0)
LYMPHS PCT: 15.3 % (ref 12.0–46.0)
Lymphs Abs: 1.2 10*3/uL (ref 0.7–4.0)
MCHC: 34.5 g/dL (ref 30.0–36.0)
MCV: 76.9 fl — AB (ref 78.0–100.0)
MONOS PCT: 4.9 % (ref 3.0–12.0)
Monocytes Absolute: 0.4 10*3/uL (ref 0.1–1.0)
Neutro Abs: 5.5 10*3/uL (ref 1.4–7.7)
Neutrophils Relative %: 73.3 % (ref 43.0–77.0)
Platelets: 263 10*3/uL (ref 150.0–400.0)
RBC: 4.03 Mil/uL (ref 3.87–5.11)
RDW: 16.6 % — ABNORMAL HIGH (ref 11.5–15.5)
WBC: 7.5 10*3/uL (ref 4.0–10.5)

## 2018-01-03 LAB — BASIC METABOLIC PANEL
BUN: 9 mg/dL (ref 6–23)
CALCIUM: 8.2 mg/dL — AB (ref 8.4–10.5)
CO2: 33 mEq/L — ABNORMAL HIGH (ref 19–32)
CREATININE: 0.6 mg/dL (ref 0.40–1.20)
Chloride: 101 mEq/L (ref 96–112)
GFR: 116.72 mL/min (ref >60.00)
Glucose, Bld: 160 mg/dL — ABNORMAL HIGH (ref 70–99)
Potassium: 2.9 mEq/L — ABNORMAL LOW (ref 3.5–5.1)
Sodium: 142 mEq/L (ref 135–145)

## 2018-01-03 LAB — HEPATIC FUNCTION PANEL
ALT: 15 U/L (ref 0–35)
AST: 16 U/L (ref 0–37)
Albumin: 3.8 g/dL (ref 3.5–5.2)
Alkaline Phosphatase: 54 U/L (ref 39–117)
BILIRUBIN TOTAL: 0.5 mg/dL (ref 0.2–1.2)
Bilirubin, Direct: 0.1 mg/dL (ref 0.0–0.3)
Total Protein: 6.6 g/dL (ref 6.0–8.3)

## 2018-01-03 LAB — MICROALBUMIN / CREATININE URINE RATIO
Creatinine,U: 185.3 mg/dL
Microalb Creat Ratio: 5.9 mg/g (ref 0.0–30.0)
Microalb, Ur: 11 mg/dL — ABNORMAL HIGH (ref 0.0–1.9)

## 2018-01-03 LAB — LDL CHOLESTEROL, DIRECT: Direct LDL: 53 mg/dL

## 2018-01-03 LAB — LIPID PANEL
CHOL/HDL RATIO: 4
CHOLESTEROL: 122 mg/dL (ref 0–200)
HDL: 33.5 mg/dL — AB (ref >39.00)

## 2018-01-03 LAB — VITAMIN D 25 HYDROXY (VIT D DEFICIENCY, FRACTURES): VITD: 36.85 ng/mL (ref 30.00–100.00)

## 2018-01-03 LAB — VITAMIN B12: Vitamin B-12: 160 pg/mL — ABNORMAL LOW (ref 211–911)

## 2018-01-03 LAB — TSH: TSH: 2.26 u[IU]/mL (ref 0.35–4.50)

## 2018-01-03 LAB — HEMOGLOBIN A1C: HEMOGLOBIN A1C: 7.4 % — AB (ref 4.6–6.5)

## 2018-01-03 MED ORDER — FUROSEMIDE 40 MG PO TABS: 40.0000 mg | ORAL_TABLET | Freq: Every day | ORAL | 3 refills | Status: DC

## 2018-01-03 MED ORDER — FUROSEMIDE 40 MG PO TABS: 40.0000 mg | ORAL_TABLET | Freq: Every day | ORAL | 0 refills | Status: DC

## 2018-01-03 NOTE — Progress Notes (Signed)
Subjective:    Patient ID: Courtney [name], female    DOB: [birth year], 42 y.o.   MRN: [id number]  HPI  Here to f/u; overall doing ok,  Pt denies chest pain, increasing sob or doe, wheezing, orthopnea, PND, increased LE swelling, palpitations, dizziness or syncope.  Pt denies new neurological symptoms such as new headache, or facial or extremity weakness or numbness.  Pt denies polydipsia, polyuria, or low sugar episode.  Pt states overall good compliance with meds, mostly trying to follow appropriate diet, with wt overall stable,  but little exercise however, due to mild left knee pain, intermittent but persistent for 2 wks, assoc with popping, swelling, no giveaways or falls.  Has seen sports medicine and plans to f/u .  Has gained some wt recently with less active. Wt Readings from Last 3 Encounters:  01/03/18 212 lb (96.2 kg)  12/12/17 207 lb 6.4 oz (94.1 kg)  07/28/17 203 lb (92.1 kg)   Past Medical History:  Diagnosis Date  . ALLERGIC RHINITIS 03/13/2007  . Allergy   . Anemia, B12 deficiency   . Anxiety   . C. difficile colitis   . CARPAL TUNNEL SYNDROME, BILATERAL 01/13/2010  . Cervical lesion 07/13/2011   Spine lesion - indeterminate, for f/u MRI may 2013 per neurology  . Chronic fatigue   . CTS (carpal tunnel syndrome)   . Diabetes mellitus without complication ([place])   . ESSENTIAL HYPERTENSION 03/13/2007  . Fibromyalgia   . GERD (gastroesophageal reflux disease)   . Heart murmur   . Hx of adenomatous polyp of colon 06/09/2009  . Hyperlipidemia   . HYPERLIPIDEMIA 03/13/2007  . Irritable bowel syndrome 05/24/2010  . Multiple sclerosis ([place])   . OBSTRUCTIVE SLEEP APNEA 12/08/2008  . Sleep apnea    uses c pap  . VITAMIN D DEFICIENCY 01/13/2010   Past Surgical History:  Procedure Laterality Date  . APPENDECTOMY  07/23/12  . CARPAL TUNNEL RELEASE  01/2010   bilateral  . COLONOSCOPY W/ BIOPSIES AND POLYPECTOMY  05/2009   2 tubular adenomas  . LAPAROSCOPIC APPENDECTOMY N/A 07/23/2012    Procedure: APPENDECTOMY LAPAROSCOPIC;  Surgeon: Gayland Curry, MD;  Location: [place];  Service: General;  Laterality: N/A;    reports that she has never smoked. She has never used smokeless tobacco. She reports that she does not drink alcohol or use drugs. family history includes Asthma in her daughter; COPD in her father; Diabetes in her father and other; Heart attack in her maternal grandfather; Hypertension in her father and other; Lung cancer in her maternal grandmother; Migraines in her mother; Prostate cancer in her maternal uncle; Stroke in her maternal grandfather. Allergies  Allergen Reactions  . Ciprofloxacin In D5w Rash  . Penicillins     REACTION: rash  . Tricor [Fenofibrate] Rash   Current Outpatient Medications on File Prior to Visit  Medication Sig Dispense Refill  . atorvastatin (LIPITOR) 20 MG tablet TAKE 1 TABLET DAILY 90 tablet 0  . B-D UF III MINI PEN NEEDLES 31G X 5 MM MISC     . baclofen (LIORESAL) 10 MG tablet Take 10 mg by mouth daily at 2 PM daily at 2 PM.    . cetirizine (ZYRTEC) 10 MG tablet Take 10 mg by mouth daily.      . Cholecalciferol (VITAMIN D) 2000 UNITS CAPS Take 2 capsules by mouth every morning. Reported on 06/05/2015    . clotrimazole (MYCELEX) 10 MG troche Take 1 tablet (10 mg total) by mouth 5 (  five) times daily. 35 tablet 2  . cyanocobalamin (,VITAMIN B-12,) 1000 MCG/ML injection Inject into the muscle every 30 (thirty) days.    Marland Kitchen dalfampridine (AMPYRA) 10 MG TB12 Take 10 mg by mouth 2 (two) times daily.    Marland Kitchen gabapentin (NEURONTIN) 300 MG capsule TAKE 1 CAPSULE THREE TIMES A DAY 270 capsule 0  . glucose blood (FREESTYLE TEST STRIPS) test strip 1 each by Other route 2 (two) times daily. And lancets, 2 times daily. 180 each 3  . glucose monitoring kit (FREESTYLE) monitoring kit 1 each by Does not apply route as needed for other. Dispense any model that is covered- dispense testing supplies for Q AC/ HS accuchecks- 1 month supply with one refil. 1 each  1  . insulin lispro (HUMALOG KWIKPEN) 100 UNIT/ML KiwkPen 3 times a day (just before each meal), 35-35-40 units, And pen needles 3/day 30 pen 3  . Lancets (FREESTYLE) lancets Use to check blood sugar two time daily. 150 each 2  . loperamide (IMODIUM) 2 MG capsule Take 2 mg by mouth as needed for diarrhea or loose stools.    . medroxyPROGESTERone (DEPO-PROVERA) 150 MG/ML injection Inject 150 mg into the muscle every 3 (three) months.      . meloxicam (MOBIC) 15 MG tablet Take 1 tablet (15 mg total) by mouth daily. 30 tablet 3  . metFORMIN (GLUCOPHAGE-XR) 500 MG 24 hr tablet Take 2 tablets (1,000 mg total) by mouth at bedtime. 180 tablet 3  . metoprolol tartrate (LOPRESSOR) 100 MG tablet Take 2 tablets (200 mg total) by mouth daily. 180 tablet 0  . nortriptyline (PAMELOR) 50 MG capsule Take 2 capsules (100 mg total) by mouth at bedtime. At bedtime 180 capsule 3  . omeprazole-sodium bicarbonate (ZEGERID) 40-1100 MG per capsule Take 1 capsule by mouth daily.      . potassium chloride (K-DUR,KLOR-CON) 10 MEQ tablet TAKE 1 TABLET DAILY 90 tablet 0  . sitaGLIPtin (JANUVIA) 100 MG tablet Take 1 tablet (100 mg total) by mouth at bedtime. 90 tablet 4  . Teriflunomide (AUBAGIO) 14 MG TABS Take 14 mg by mouth daily.    . traMADol (ULTRAM) 50 MG tablet Take 1 tablet (50 mg total) by mouth every 6 (six) hours as needed. 20 tablet 2   No current facility-administered medications on file prior to visit.    Review of Systems  Constitutional: Negative for other unusual diaphoresis or sweats HENT: Negative for ear discharge or swelling Eyes: Negative for other worsening visual disturbances Respiratory: Negative for stridor or other swelling  Gastrointestinal: Negative for worsening distension or other blood Genitourinary: Negative for retention or other urinary change Musculoskeletal: Negative for other MSK pain or swelling Skin: Negative for color change or other new lesions Neurological: Negative for  worsening tremors and other numbness  Psychiatric/Behavioral: Negative for worsening agitation or other fatigue All other system neg per pt    Objective:   Physical Exam BP 126/88   Pulse 86   Temp 98.7 F (37.1 C) (Oral)   Ht '5\' 1"'  (1.549 m)   Wt 212 lb (96.2 kg)   SpO2 97%   BMI 40.06 kg/m  VS noted,  Constitutional: Pt appears in NAD HENT: Head: NCAT.  Right Ear: External ear normal.  Left Ear: External ear normal.  Eyes: . Pupils are equal, round, and reactive to light. Conjunctivae and EOM are normal Nose: without d/c or deformity Neck: Neck supple. Gross normal ROM Cardiovascular: Normal rate and regular rhythm.   Pulmonary/Chest: Effort normal  and breath sounds without rales or wheezing.  Abd:  Soft, NT, ND, + BS, no organomegaly Neurological: Pt is alert. At baseline orientation, motor intact except chronic mild RUE and RLE weakness Skin: Skin is warm. No rashes, other new lesions, no LE edema Psychiatric: Pt behavior is normal without agitation         Assessment & Plan:

## 2018-01-03 NOTE — Patient Instructions (Signed)

## 2018-01-03 NOTE — Assessment & Plan Note (Signed)
stable overall by history and exam, recent data reviewed with pt, and pt to continue medical treatment as before,  to f/u any worsening symptoms or concerns  

## 2018-01-03 NOTE — Assessment & Plan Note (Signed)
stable overall by history and exam, recent data reviewed with pt, and pt to continue medical treatment as before,  to f/u any worsening symptoms or concerns, for f/u lab today on liptior

## 2018-01-03 NOTE — Assessment & Plan Note (Signed)
stable overall by history and exam, recent data reviewed with pt, and pt to continue medical treatment as before,  to f/u any worsening symptoms or concerns Lab Results  Component Value Date   HGBA1C 7.0 07/18/2017

## 2018-01-04 ENCOUNTER — Other Ambulatory Visit: Payer: Self-pay | Admitting: Internal Medicine

## 2018-01-04 ENCOUNTER — Telehealth: Payer: Self-pay

## 2018-01-04 ENCOUNTER — Other Ambulatory Visit (INDEPENDENT_AMBULATORY_CARE_PROVIDER_SITE_OTHER): Payer: Medicare Other

## 2018-01-04 DIAGNOSIS — D649 Anemia, unspecified: Secondary | ICD-10-CM

## 2018-01-04 LAB — IBC PANEL
Iron: 52 ug/dL (ref 42–145)
Saturation Ratios: 10.7 % — ABNORMAL LOW (ref 20.0–50.0)
Transferrin: 348 mg/dL (ref 212.0–360.0)

## 2018-01-04 MED ORDER — POTASSIUM CHLORIDE CRYS ER 10 MEQ PO TBCR: 20.0000 meq | EXTENDED_RELEASE_TABLET | Freq: Every day | ORAL | 3 refills | Status: DC

## 2018-01-04 MED ORDER — METFORMIN HCL ER 500 MG PO TB24: 1500.0000 mg | ORAL_TABLET | Freq: Every day | ORAL | 3 refills | Status: DC

## 2018-01-04 NOTE — Telephone Encounter (Signed)
Pt has viewed results via MyChart  

## 2018-01-04 NOTE — Telephone Encounter (Signed)
-----   Message from Biagio Borg, MD sent at 01/04/2018  9:51 AM EDT ----- Left message on MyChart, pt to cont same tx except  The test results show that your current treatment is OK, except the potassium is moderately low, the Vitamin B12 is severely low, the A1c is mildly high, there is mild anemia, and also the Triglycerides are high.  Please: 1) increase the potassium pill to 2 per day 2)  Start monthly B12 shots here at the office for low b12 3)  Change the metformin to taking the ER version at total of 1500 mg in the AM 4) we will addon a test for iron as well to the blood already drawn 5) please follow a lower fat diet and work on weight control as you are  Courtney [name] to please inform pt and assist with addon iron panel and assist pt with scheduling the b12 shots, and I will do rx change x 2

## 2018-01-23 ENCOUNTER — Ambulatory Visit (INDEPENDENT_AMBULATORY_CARE_PROVIDER_SITE_OTHER): Payer: Medicare Other | Admitting: *Deleted

## 2018-01-23 DIAGNOSIS — Z309 Encounter for contraceptive management, unspecified: Secondary | ICD-10-CM

## 2018-01-23 DIAGNOSIS — E538 Deficiency of other specified B group vitamins: Secondary | ICD-10-CM | POA: Diagnosis not present

## 2018-01-23 MED ORDER — CYANOCOBALAMIN 1000 MCG/ML IJ SOLN
1000.0000 ug | Freq: Once | INTRAMUSCULAR | Status: AC
  Administered 2018-01-23: 1000 ug via INTRAMUSCULAR

## 2018-01-23 MED ORDER — MEDROXYPROGESTERONE ACETATE 150 MG/ML IM SUSP
150.0000 mg | Freq: Once | INTRAMUSCULAR | Status: AC
  Administered 2018-01-23: 150 mg via INTRAMUSCULAR

## 2018-01-23 NOTE — Progress Notes (Signed)
Medical screening examination/treatment/procedure(s) were performed by non-physician practitioner and as supervising physician I was immediately available for consultation/collaboration. I agree with above. Verdie Wilms, MD   

## 2018-01-26 NOTE — Progress Notes (Signed)
NEUROLOGY FOLLOW UP OFFICE NOTE  Courtney [name] [id number]  HISTORY OF PRESENT ILLNESS: Courtney [name] is a 42 year old right-handed Caucasian female who follows up for multiple sclerosis.  UPDATE: Current DMT:  Aubagio Other current medication:  Baclofen 19m, gabapentin 3059mthree times daily, D3 6000 IU daily, B12 1000 mcg injection every 30 days, nortriptyline 5077mtramadol 43m8mparingly)  01/03/18 LABS:  CBC with diff with WBC 7.5, HGB 10.7, HCT 30.9, MCV 76.9, PLT 263, ALC 1.2; hepatic panel with t bili 0.5, ALP 54, AST 16, ALT 15; B12 160; Vitamin D 36.85.  She was started on B12 shots.  Vision: No Motor: Residual right upper and lower extremity weakness Sensory: Since 3-4 weeks ago, she reports tingling in her face (entire face, around the nose, around eyes).  It may occur off and on for 10 minutes 3 times a week.   Pain: Occasional pain and tightness in her lower extremities.  She still has pain in her foot due to Achilles tendonitis and bone spurs.  It affects her walking. Gait: Affected due to foot pain. Bowel/Bladder:  No change Fatigue: No Cognition: Sometimes she has trouble remembering new information Mood: Good  HISTORY: Onset of symptoms and diagnosis occurred in 2006.  She experienced an episode of numbness in her right arm and both lower extremities.  She was dropping objects.  MRI of brain reportedly showed "solitary right mid-to-posterior frontal periventricular white matter lesion".  MRI of spine revealed no cord demyelination.  CSF analysis from lumbar puncture reportedly revealed oligoclonal bands and an elevated IgG index.  She was diagnosed with clinically isolated syndrome but was officially diagnosed with multiple sclerosis a few months later when she developed a relapse with left hand numbness.  In February 2013, she developed left arm pain and swelling.  MRI of cervical spine revealed an enhancing spinal cord lesion at C4 on the right.  She was treated  with IV Solumedrol.  In 2014, she had an episode of optic neuritis in her right eye, treated with IV Solumedrol  There is no family history of MS.  Past disease modifying therapy:  Avonex (difficulty injecting), Rebif (painful injections), Tecfidera (GI side effects)  Imaging: 06/03/11:  MRI brain with and without contrast:  single right frontal periventricular white matter lesion with high T2 FLAIR signal 06/03/11:  MRI cervical spine with and without contrast:  abnormal rim enhancing lesion in the spinal cord at the level of C4 on the right which mildly expands the cord. 01/21/12:  MRI brain with and without contrast:  unchanged nonspecific right periventricular white matter lesion 01/21/12: MRI cervical spine with and without contrast: stable nonenhancing cord lesion at C4 11/14/12: MRI brain with and without contrast.  No change 11/14/12:  MRI cervical spine with and without contrast:  No change 12/02/13: MRI brain with and without contrast:  1. No substantial change in the right frontal juxtacortical, right corona radiata, and left periatrial white matter T2/FLAIR hyperintensities. While nonspecific, these likely reflect demyelinating lesions in this patient with a history of multiple sclerosis and concomitant cervical cord lesion. No new lesions are identified and none of the existing lesions enhance or restrict diffusion.  2. Subcentimeter left parafalcine dural based lesion as described above has slowly enlarged over time and demonstrates equivocal enhancement, but may represent a meningioma.Recommend attention on follow up imaging. 12/02/13:  MRI cervical spine with and without contrast:  No change 12/15/14:  MRI brain with and without contrast:  1.Similar appearance of focal  T2/FLAIR hyperintense white matter lesion within the right lateral body of the corpus callosum without associated enhancement to suggest active demyelination.  2.No new or enlarging white matter lesions identified.   3.Slowly enlarging nonenhancing nodular lesion along the left anterior aspect of the falx cerebri, which may reflect a small meningioma. 12/15/14:  MRI cervical spine with and without contrast:  no change 10/10/15:  MRI brain with and without contrast:  no change 10/10/15:  MRI cervical spine with and without contrast:  no change 02/07/17:  MRI brain with and without contrast: No significant change since 12/24/2008 02/07/2017: MRI cervical spine with and without contrast: Chronic lesion at C4 level, stable since 2013  PAST MEDICAL HISTORY: Past Medical History:  Diagnosis Date  . ALLERGIC RHINITIS 03/13/2007  . Allergy   . Anemia, B12 deficiency   . Anxiety   . C. difficile colitis   . CARPAL TUNNEL SYNDROME, BILATERAL 01/13/2010  . Cervical lesion 07/13/2011   Spine lesion - indeterminate, for f/u MRI may 2013 per neurology  . Chronic fatigue   . CTS (carpal tunnel syndrome)   . Diabetes mellitus without complication ([place])   . ESSENTIAL HYPERTENSION 03/13/2007  . Fibromyalgia   . GERD (gastroesophageal reflux disease)   . Heart murmur   . Hx of adenomatous polyp of colon 06/09/2009  . Hyperlipidemia   . HYPERLIPIDEMIA 03/13/2007  . Irritable bowel syndrome 05/24/2010  . Multiple sclerosis ([place])   . OBSTRUCTIVE SLEEP APNEA 12/08/2008  . Sleep apnea    uses c pap  . VITAMIN D DEFICIENCY 01/13/2010    MEDICATIONS: Current Outpatient Medications on File Prior to Visit  Medication Sig Dispense Refill  . atorvastatin (LIPITOR) 20 MG tablet TAKE 1 TABLET DAILY 90 tablet 0  . B-D UF III MINI PEN NEEDLES 31G X 5 MM MISC     . baclofen (LIORESAL) 10 MG tablet Take 10 mg by mouth daily at 2 PM daily at 2 PM.    . cetirizine (ZYRTEC) 10 MG tablet Take 10 mg by mouth daily.      . Cholecalciferol (VITAMIN D) 2000 UNITS CAPS Take 2 capsules by mouth every morning. Reported on 06/05/2015    . clotrimazole (MYCELEX) 10 MG troche Take 1 tablet (10 mg total) by mouth 5 (five) times daily. 35 tablet 2   . cyanocobalamin (,VITAMIN B-12,) 1000 MCG/ML injection Inject into the muscle every 30 (thirty) days.    Marland Kitchen dalfampridine (AMPYRA) 10 MG TB12 Take 10 mg by mouth 2 (two) times daily.    . furosemide (LASIX) 40 MG tablet Take 1 tablet (40 mg total) by mouth daily. 90 tablet 3  . gabapentin (NEURONTIN) 300 MG capsule TAKE 1 CAPSULE THREE TIMES A DAY 270 capsule 0  . glucose blood (FREESTYLE TEST STRIPS) test strip 1 each by Other route 2 (two) times daily. And lancets, 2 times daily. 180 each 3  . glucose monitoring kit (FREESTYLE) monitoring kit 1 each by Does not apply route as needed for other. Dispense any model that is covered- dispense testing supplies for Q AC/ HS accuchecks- 1 month supply with one refil. 1 each 1  . insulin lispro (HUMALOG KWIKPEN) 100 UNIT/ML KiwkPen 3 times a day (just before each meal), 35-35-40 units, And pen needles 3/day 30 pen 3  . Lancets (FREESTYLE) lancets Use to check blood sugar two time daily. 150 each 2  . loperamide (IMODIUM) 2 MG capsule Take 2 mg by mouth as needed for diarrhea or loose stools.    Marland Kitchen  medroxyPROGESTERone (DEPO-PROVERA) 150 MG/ML injection Inject 150 mg into the muscle every 3 (three) months.      . meloxicam (MOBIC) 15 MG tablet Take 1 tablet (15 mg total) by mouth daily. 30 tablet 3  . metFORMIN (GLUCOPHAGE-XR) 500 MG 24 hr tablet Take 3 tablets (1,500 mg total) by mouth daily with breakfast. 270 tablet 3  . metoprolol tartrate (LOPRESSOR) 100 MG tablet Take 2 tablets (200 mg total) by mouth daily. 180 tablet 0  . nortriptyline (PAMELOR) 50 MG capsule Take 2 capsules (100 mg total) by mouth at bedtime. At bedtime 180 capsule 3  . omeprazole-sodium bicarbonate (ZEGERID) 40-1100 MG per capsule Take 1 capsule by mouth daily.      . potassium chloride (K-DUR,KLOR-CON) 10 MEQ tablet Take 2 tablets (20 mEq total) by mouth daily. 180 tablet 3  . sitaGLIPtin (JANUVIA) 100 MG tablet Take 1 tablet (100 mg total) by mouth at bedtime. 90 tablet 4  .  Teriflunomide (AUBAGIO) 14 MG TABS Take 14 mg by mouth daily.    . traMADol (ULTRAM) 50 MG tablet Take 1 tablet (50 mg total) by mouth every 6 (six) hours as needed. 20 tablet 2   No current facility-administered medications on file prior to visit.     ALLERGIES: Allergies  Allergen Reactions  . Ciprofloxacin In D5w Rash  . Penicillins     REACTION: rash  . Tricor [Fenofibrate] Rash    FAMILY HISTORY: Family History  Problem Relation Age of Onset  . Migraines Mother   . Hypertension Father   . COPD Father   . Diabetes Father   . Prostate cancer Maternal Uncle        prostate cancer  . Lung cancer Maternal Grandmother   . Stroke Maternal Grandfather   . Heart attack Maternal Grandfather   . Diabetes Other   . Hypertension Other   . Asthma Daughter   . Colon cancer Neg Hx   . Esophageal cancer Neg Hx   . Rectal cancer Neg Hx   . Stomach cancer Neg Hx     SOCIAL HISTORY: Social History   Socioeconomic History  . Marital status: Married    Spouse name: Not on file  . Number of children: 2  . Years of education: Not on file  . Highest education level: Not on file  Occupational History  . 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 Financial resource strain: Not on file  . Food insecurity:    Worry: Not on file    Inability: Not on file  . Transportation needs:    Medical: Not on file    Non-medical: Not on file  Tobacco Use  . Smoking status: Never Smoker  . Smokeless tobacco: Never Used  Substance and Sexual Activity  . Alcohol use: No    Alcohol/week: 0.0 standard drinks  . Drug use: No  . Sexual activity: Not on file  Lifestyle  . Physical activity:    Days per week: Not on file    Minutes per session: Not on file  . Stress: Not on file  Relationships  . Social connections:    Talks on phone: Not on file    Gets together: Not on file    Attends religious service: Not on file    Active member of club or  organization: Not on file    Attends meetings of clubs or organizations: Not on file    Relationship status: Not on file  .  Intimate partner violence:    Fear of current or ex partner: Not on file    Emotionally abused: Not on file    Physically abused: Not on file    Forced sexual activity: Not on file  Other Topics Concern  . Not on file  Social History Narrative   Daily Caffeine    REVIEW OF SYSTEMS: Constitutional: No fevers, chills, or sweats, no generalized fatigue, change in appetite Eyes: No visual changes, double vision, eye pain Ear, nose and throat: No hearing loss, ear pain, nasal congestion, sore throat Cardiovascular: No chest pain, palpitations Respiratory:  No shortness of breath at rest or with exertion, wheezes GastrointestinaI: No nausea, vomiting, diarrhea, abdominal pain, fecal incontinence Genitourinary:  No dysuria, urinary retention or frequency Musculoskeletal:  No neck pain, back pain Integumentary: No rash, pruritus, skin lesions Neurological: as above Psychiatric: No depression, insomnia, anxiety Endocrine: No palpitations, fatigue, diaphoresis, mood swings, change in appetite, change in weight, increased thirst Hematologic/Lymphatic:  No purpura, petechiae. Allergic/Immunologic: no itchy/runny eyes, nasal congestion, recent allergic reactions, rashes  PHYSICAL EXAM: Blood pressure (!) 132/92, pulse 90, height _0  (1.549 m), weight 208 lb (94.3 kg), SpO2 99 %. General: No acute distress.  Patient appears well-groomed.   Head:  Normocephalic/atraumatic Eyes:  Fundi examined but not visualized Neck: supple, no paraspinal tenderness, full range of motion Heart:  Regular rate and rhythm Lungs:  Clear to auscultation bilaterally Back: No paraspinal tenderness Neurological Exam: alert and oriented to person, place, and time. Attention span and concentration intact, recent and remote memory intact, fund of knowledge intact.  Speech fluent and not  dysarthric, language intact.  CN II-XII intact. Bulk and tone normal, muscle strength 5/5 throughout.  Sensation to temperature and vibration intact.  Deep tendon reflexes 2+ throughout, toes downgoing.  Finger to nose and heel to shin testing intact.  Slight right limp due to foot pain; Timed 25 foot walk 7.30 seconds; Romberg negative.  IMPRESSION: Multiple sclerosis.  3 to 4 week history of facial paresthesias.  Although it is not altering ADLs, it is a new symptom that bothers her and requests treatment.  I will prescribe her a prednisone taper.  PLAN: 1.  Prednisone taper:  20m daily for 4 days, 40 mg daily for 4 days, 20 mg daily for 4 days, then stop. 2.  Check MRI of the brain and cervical spine with and without contrast. 3.  Increase D3 to 8000 IU daily. 4.  Continue Aubagio 5.  Repeat CBC with differential, CMP, and vitamin D level in 5 months. 6.  Follow-up with me in 6 months.  20 minutes spent face to face with patient, over 50% spent discussing management.  AMetta Clines DO  CC: JCathlean Cower MD

## 2018-01-29 ENCOUNTER — Ambulatory Visit (INDEPENDENT_AMBULATORY_CARE_PROVIDER_SITE_OTHER): Payer: Medicare Other | Admitting: Neurology

## 2018-01-29 ENCOUNTER — Encounter: Payer: Self-pay | Admitting: Neurology

## 2018-01-29 VITALS — BP 132/92 | HR 90 | Ht 61.0 in | Wt 208.0 lb

## 2018-01-29 DIAGNOSIS — Z79899 Other long term (current) drug therapy: Secondary | ICD-10-CM | POA: Diagnosis not present

## 2018-01-29 DIAGNOSIS — E559 Vitamin D deficiency, unspecified: Secondary | ICD-10-CM | POA: Diagnosis not present

## 2018-01-29 DIAGNOSIS — G35 Multiple sclerosis: Secondary | ICD-10-CM | POA: Diagnosis not present

## 2018-01-29 MED ORDER — PREDNISONE 20 MG PO TABS: ORAL_TABLET | ORAL | 0 refills | Status: DC

## 2018-01-29 NOTE — Patient Instructions (Addendum)
1.  To help break the MS symptoms, I prescribed you a prednisone taper with 20mg  tablets:  Take 3 tablets daily for 4 days, then 2 tablets daily for 4 days, then 1 tablet daily for 4 days. 2.  We will get MRI of brain and cervical spine with and without contrast  3.  Continue Aubagio 4.  Increase vitamin D3 to 8000 IU daily 5.  Repeat CBC with diff, CMP and vitamin D in 5 months  6.  Follow up with me in 6 months.  We have sent a referral to [facility] for your MRI's and they will call you directly to schedule your appt. They are located at [place]. If you need to contact them directly please call [phone].   Your provider has requested that you have labwork completed in 5 months. Please come to our office and register, then go to [facility] ([id number]) on the second floor of this building.. You will not need to check in at their front desk. If you are not called within 15 minutes please then check with the front desk.

## 2018-02-19 ENCOUNTER — Ambulatory Visit
Admission: RE | Admit: 2018-02-19 | Discharge: 2018-02-19 | Disposition: A | Discharge status: Home / Self Care | Payer: Medicare Other | Source: Ambulatory Visit | Attending: Neurology | Admitting: Neurology

## 2018-02-19 ENCOUNTER — Other Ambulatory Visit: Payer: Self-pay | Admitting: Internal Medicine

## 2018-02-19 DIAGNOSIS — G35 Multiple sclerosis: Secondary | ICD-10-CM

## 2018-02-19 DIAGNOSIS — R202 Paresthesia of skin: Secondary | ICD-10-CM | POA: Diagnosis not present

## 2018-02-19 MED ORDER — GADOBENATE DIMEGLUMINE 529 MG/ML IV SOLN
19.0000 mL | Freq: Once | INTRAVENOUS | Status: AC | PRN
  Administered 2018-02-19: 19 mL via INTRAVENOUS

## 2018-02-21 ENCOUNTER — Telehealth: Payer: Self-pay

## 2018-02-21 NOTE — Telephone Encounter (Signed)
-----   Message from Pieter Partridge, DO sent at 02/21/2018 11:39 AM EST ----- MRI shows mild increase in MS findings, however it is old and not recent.  There is no significant change.  I would continue with current management for now.

## 2018-02-21 NOTE — Telephone Encounter (Signed)
Called, LM[facility] advising Pt of MRI results

## 2018-02-26 ENCOUNTER — Ambulatory Visit (INDEPENDENT_AMBULATORY_CARE_PROVIDER_SITE_OTHER): Payer: Medicare Other

## 2018-02-26 DIAGNOSIS — E538 Deficiency of other specified B group vitamins: Secondary | ICD-10-CM | POA: Diagnosis not present

## 2018-02-26 MED ORDER — CYANOCOBALAMIN 1000 MCG/ML IJ SOLN
1000.0000 ug | Freq: Once | INTRAMUSCULAR | Status: AC
  Administered 2018-02-26: 1000 ug via INTRAMUSCULAR

## 2018-02-26 NOTE — Progress Notes (Signed)
Medical screening examination/treatment/procedure(s) were performed by non-physician practitioner and as supervising physician I was immediately available for consultation/collaboration. I agree with above. James John, MD   

## 2018-02-28 ENCOUNTER — Ambulatory Visit (INDEPENDENT_AMBULATORY_CARE_PROVIDER_SITE_OTHER): Payer: Medicare Other | Admitting: Neurology

## 2018-02-28 ENCOUNTER — Encounter: Payer: Self-pay | Admitting: Neurology

## 2018-02-28 VITALS — BP 134/92 | HR 85 | Ht 61.0 in | Wt 211.0 lb

## 2018-02-28 DIAGNOSIS — G35 Multiple sclerosis: Secondary | ICD-10-CM

## 2018-02-28 NOTE — Progress Notes (Signed)
NEUROLOGY FOLLOW UP OFFICE NOTE  Courtney [name] [id number]  HISTORY OF PRESENT ILLNESS: Courtney [name] is a 42 year old right-handed Caucasian female who follows up for multiple sclerosis.  UPDATE:  Current DMT: Aubagio Other current medications: Baclofen 10 mg, gabapentin 300 mg 3 times daily, D3 8000 IU daily, B12 1026mg injection every 30 days, nortriptyline 50 mg, tramadol 50 mg (sparingly)  Last month, she reported to 4 weeks history of tingling in her face, which may occur off and on for 10 minutes 3 times a week.  She was prescribed a prednisone taper.  Compared to prior MRI from 02/07/2017, repeat MRI of the brain and cervical spine with and without contrast from 02/19/2018, which were personally reviewed, demonstrated mildly progressed bifrontal cerebral white matter changes as well as probable additional abnormal signal in the left cord at the C6-7 level but no abnormal enhancement to suggest active demyelinating disease.  She continues to have pins-and-needles sensation involving her entire head, face, and neck.   Vision: No Motor: Residual right upper and lower extremity weakness Sensory: As above  Pain: Occasional pain and tightness in her lower extremities.  She still has pain in her foot due to Achilles tendonitis and bone spurs.  It affects her walking. Gait: Affected due to foot pain. Bowel/Bladder:  No change Fatigue: No Cognition: Sometimes she has trouble remembering new information Mood: Good  HISTORY: Onset of symptoms and diagnosis occurred in 2006. She experienced an episode of numbness in her right arm and both lower extremities. She was dropping objects. MRI of brain reportedly showed "solitary right mid-to-posterior frontal periventricular white matter lesion". MRI of spine revealed no cord demyelination. CSF analysis from lumbar puncture reportedly revealed oligoclonal bands and an elevated IgG index. She was diagnosed with clinically isolated syndrome but  was officially diagnosed with multiple sclerosis a few months later when she developed a relapse with left hand numbness. In February 2013, she developed left arm pain and swelling. MRI of cervical spine revealed an enhancing spinal cord lesion at C4 on the right. She was treated with IV Solumedrol. In 2014, she had an episode of optic neuritis in her right eye, treated with IV Solumedrol  There is no family history of MS.  Past disease modifying therapy: Avonex (difficulty injecting), Rebif (painful injections), Tecfidera (GI side effects)  Imaging: 06/03/11: MRI brain with and without contrast: single right frontal periventricular white matter lesion with high T2 FLAIR signal 06/03/11: MRI cervical spine with and without contrast: abnormal rim enhancing lesion in the spinal cord at the level of C4 on the right which mildly expands the cord. 01/21/12: MRI brain with and without contrast: unchanged nonspecific right periventricular white matter lesion 01/21/12: MRI cervical spine with and without contrast: stable nonenhancing cord lesion at C4 11/14/12: MRI brain with and without contrast. No change 11/14/12: MRI cervical spine with and without contrast: No change 12/02/13: MRI brain with and without contrast: 1. No substantial change in the right frontal juxtacortical, right corona radiata, and left periatrial white matter T2/FLAIR hyperintensities. While nonspecific, these likely reflect demyelinating lesions in this patient with a history of multiple sclerosis and concomitant cervical cord lesion. No new lesions are identified and none of the existing lesions enhance or restrict diffusion. 2. Subcentimeter left parafalcine dural based lesion as described above has slowly enlarged over time and demonstrates equivocal enhancement, but may represent a meningioma.Recommend attention on follow up imaging. 12/02/13: MRI cervical spine with and without contrast: No change 12/15/14: MRI  brain  with and without contrast: 1.Similar appearance of focal T2/FLAIR hyperintense white matter lesion within the right lateral body of the corpus callosum without associated enhancement to suggest active demyelination. 2.No new or enlarging white matter lesions identified. 3.Slowly enlarging nonenhancing nodular lesion along the left anterior aspect of the falx cerebri, which may reflect a small meningioma. 12/15/14: MRI cervical spine with and without contrast: no change 10/10/15: MRI brain with and without contrast: no change 10/10/15: MRI cervical spine with and without contrast: no change 02/07/17:  MRI brain with and without contrast: No significant change since 12/24/2008 02/07/2017: MRI cervical spine with and without contrast: Chronic lesion at C4 level, stable since 2013 02/19/18:  MRI brain with and without contrast: Compared to prior imaging from 02/07/17, mildly progressed bifrontal cerebral white matter changes, right greater than left, but no abnormal enhancement to suggest active disease. 02/19/18:  MRI cervical spine with and without contrast: Again demonstrated chronic lesion at C4 level as well as probable subtle patchy cord signal abnormality within the left cord at level of C6-7 not definitely seen on prior imaging from 02/07/17 but no abnormal enhancement to suggest active disease.  PAST MEDICAL HISTORY: Past Medical History:  Diagnosis Date  . ALLERGIC RHINITIS 03/13/2007  . Allergy   . Anemia, B12 deficiency   . Anxiety   . C. difficile colitis   . CARPAL TUNNEL SYNDROME, BILATERAL 01/13/2010  . Cervical lesion 07/13/2011   Spine lesion - indeterminate, for f/u MRI may 2013 per neurology  . Chronic fatigue   . CTS (carpal tunnel syndrome)   . Diabetes mellitus without complication ([place])   . ESSENTIAL HYPERTENSION 03/13/2007  . Fibromyalgia   . GERD (gastroesophageal reflux disease)   . Heart murmur   . Hx of adenomatous polyp of colon 06/09/2009  .  Hyperlipidemia   . HYPERLIPIDEMIA 03/13/2007  . Irritable bowel syndrome 05/24/2010  . Multiple sclerosis ([place])   . OBSTRUCTIVE SLEEP APNEA 12/08/2008  . Sleep apnea    uses c pap  . VITAMIN D DEFICIENCY 01/13/2010    MEDICATIONS: Current Outpatient Medications on File Prior to Visit  Medication Sig Dispense Refill  . atorvastatin (LIPITOR) 20 MG tablet TAKE 1 TABLET DAILY 90 tablet 1  . B-D UF III MINI PEN NEEDLES 31G X 5 MM MISC     . baclofen (LIORESAL) 10 MG tablet Take 10 mg by mouth daily at 2 PM daily at 2 PM.    . cetirizine (ZYRTEC) 10 MG tablet Take 10 mg by mouth daily.      . Cholecalciferol (VITAMIN D) 2000 UNITS CAPS Take 2 capsules by mouth every morning. Reported on 06/05/2015    . clotrimazole (MYCELEX) 10 MG troche Take 1 tablet (10 mg total) by mouth 5 (five) times daily. 35 tablet 2  . cyanocobalamin (,VITAMIN B-12,) 1000 MCG/ML injection Inject into the muscle every 30 (thirty) days.    Marland Kitchen dalfampridine (AMPYRA) 10 MG TB12 Take 10 mg by mouth 2 (two) times daily.    . furosemide (LASIX) 40 MG tablet Take 1 tablet (40 mg total) by mouth daily. 90 tablet 3  . gabapentin (NEURONTIN) 300 MG capsule TAKE 1 CAPSULE THREE TIMES A DAY 270 capsule 0  . glucose blood (FREESTYLE TEST STRIPS) test strip 1 each by Other route 2 (two) times daily. And lancets, 2 times daily. 180 each 3  . glucose monitoring kit (FREESTYLE) monitoring kit 1 each by Does not apply route as needed for other. Dispense any model that is  covered- dispense testing supplies for Q AC/ HS accuchecks- 1 month supply with one refil. 1 each 1  . insulin lispro (HUMALOG KWIKPEN) 100 UNIT/ML KiwkPen 3 times a day (just before each meal), 35-35-40 units, And pen needles 3/day 30 pen 3  . Lancets (FREESTYLE) lancets Use to check blood sugar two time daily. 150 each 2  . loperamide (IMODIUM) 2 MG capsule Take 2 mg by mouth as needed for diarrhea or loose stools.    . medroxyPROGESTERone (DEPO-PROVERA) 150 MG/ML injection  Inject 150 mg into the muscle every 3 (three) months.      . meloxicam (MOBIC) 15 MG tablet Take 1 tablet (15 mg total) by mouth daily. 30 tablet 3  . metFORMIN (GLUCOPHAGE-XR) 500 MG 24 hr tablet Take 3 tablets (1,500 mg total) by mouth daily with breakfast. 270 tablet 3  . metoprolol tartrate (LOPRESSOR) 100 MG tablet Take 2 tablets (200 mg total) by mouth daily. 180 tablet 0  . nortriptyline (PAMELOR) 50 MG capsule Take 2 capsules (100 mg total) by mouth at bedtime. At bedtime 180 capsule 3  . omeprazole-sodium bicarbonate (ZEGERID) 40-1100 MG per capsule Take 1 capsule by mouth daily.      . potassium chloride (K-DUR,KLOR-CON) 10 MEQ tablet Take 2 tablets (20 mEq total) by mouth daily. 180 tablet 3  . predniSONE (DELTASONE) 20 MG tablet Take 3 tablets daily for 4 days, then 2 tablets daily for 4 days, then 1 tablet daily for 4 days, then STOP 24 tablet 0  . sitaGLIPtin (JANUVIA) 100 MG tablet Take 1 tablet (100 mg total) by mouth at bedtime. 90 tablet 4  . Teriflunomide (AUBAGIO) 14 MG TABS Take 14 mg by mouth daily.    . traMADol (ULTRAM) 50 MG tablet Take 1 tablet (50 mg total) by mouth every 6 (six) hours as needed. 20 tablet 2   No current facility-administered medications on file prior to visit.     ALLERGIES: Allergies  Allergen Reactions  . Ciprofloxacin In D5w Rash  . Penicillins     REACTION: rash  . Tricor [Fenofibrate] Rash    FAMILY HISTORY: Family History  Problem Relation Age of Onset  . Migraines Mother   . Hypertension Father   . COPD Father   . Diabetes Father   . Prostate cancer Maternal Uncle        prostate cancer  . Lung cancer Maternal Grandmother   . Stroke Maternal Grandfather   . Heart attack Maternal Grandfather   . Diabetes Other   . Hypertension Other   . Asthma Daughter   . Colon cancer Neg Hx   . Esophageal cancer Neg Hx   . Rectal cancer Neg Hx   . Stomach cancer Neg Hx     SOCIAL HISTORY: Social History   Socioeconomic History  .  Marital status: Married    Spouse name: Not on file  . Number of children: 2  . Years of education: Not on file  . Highest education level: Not on file  Occupational History  . 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 Financial resource strain: Not on file  . Food insecurity:    Worry: Not on file    Inability: Not on file  . Transportation needs:    Medical: Not on file    Non-medical: Not on file  Tobacco Use  . Smoking status: Never Smoker  . Smokeless tobacco: Never Used  Substance and Sexual Activity  .  Alcohol use: No    Alcohol/week: 0.0 standard drinks  . Drug use: No  . Sexual activity: Not on file  Lifestyle  . Physical activity:    Days per week: Not on file    Minutes per session: Not on file  . Stress: Not on file  Relationships  . Social connections:    Talks on phone: Not on file    Gets together: Not on file    Attends religious service: Not on file    Active member of club or organization: Not on file    Attends meetings of clubs or organizations: Not on file    Relationship status: Not on file  . Intimate partner violence:    Fear of current or ex partner: Not on file    Emotionally abused: Not on file    Physically abused: Not on file    Forced sexual activity: Not on file  Other Topics Concern  . Not on file  Social History Narrative   Daily Caffeine    REVIEW OF SYSTEMS: Constitutional: No fevers, chills, or sweats, no generalized fatigue, change in appetite Eyes: No visual changes, double vision, eye pain Ear, nose and throat: No hearing loss, ear pain, nasal congestion, sore throat Cardiovascular: No chest pain, palpitations Respiratory:  No shortness of breath at rest or with exertion, wheezes GastrointestinaI: No nausea, vomiting, diarrhea, abdominal pain, fecal incontinence Genitourinary:  No dysuria, urinary retention or frequency Musculoskeletal:  No neck pain, back pain Integumentary: No rash,  pruritus, skin lesions Neurological: as above Psychiatric: No depression, insomnia, anxiety Endocrine: No palpitations, fatigue, diaphoresis, mood swings, change in appetite, change in weight, increased thirst Hematologic/Lymphatic:  No purpura, petechiae. Allergic/Immunologic: no itchy/runny eyes, nasal congestion, recent allergic reactions, rashes  PHYSICAL EXAM: Blood pressure (!) 134/92, pulse 85, height '5\' 1"'  (1.549 m), weight 211 lb (95.7 kg), SpO2 98 %. General: No acute distress.  Patient appears well-groomed.    IMPRESSION: Multiple sclerosis B12 deficiency  MRI did show mild progression of disease but no acute demyelination.  Her numbness and paresthesias may be due to B12 deficiency as well.  Remaining on Aubagio or switching disease modifying therapy are both reasonable options.  We discussed other treatment options.  She is interested in [place].  At this time, she will remain on a [place].  Information about Ocrevus was provided to her.  She will review the information and if she decides to change therapy, she will contact us.  Otherwise she will follow-up in April as scheduled.  25 minutes spent with the patient, 100% spent discussing MRI results and treatment options.  Metta Clines, DO  CC: Cathlean Cower, MD

## 2018-02-28 NOTE — Patient Instructions (Signed)
1.  To help with tingling and pins and needles sensation, try increasing gabapentin:   Start with 300mg  in AM, 300mg  in afternoon and 600mg  at night    In 1 week, may increase to 600mg  in AM, 300mg  in afternoon, and 600mg  at night   In 1 week, may increase to 600mg  three times daily   Contact me for refills and verify which dose you are on 2.  Continue B12 shots. 3.  Continue Aubagio.  Review information on Ocrevus  4.  Follow up as already scheduled.

## 2018-03-22 ENCOUNTER — Other Ambulatory Visit: Payer: Self-pay | Admitting: Internal Medicine

## 2018-03-27 ENCOUNTER — Ambulatory Visit: Payer: Medicare Other | Admitting: Endocrinology

## 2018-03-28 ENCOUNTER — Ambulatory Visit (INDEPENDENT_AMBULATORY_CARE_PROVIDER_SITE_OTHER): Payer: Medicare Other

## 2018-03-28 DIAGNOSIS — E538 Deficiency of other specified B group vitamins: Secondary | ICD-10-CM

## 2018-03-28 MED ORDER — CYANOCOBALAMIN 1000 MCG/ML IJ SOLN
1000.0000 ug | Freq: Once | INTRAMUSCULAR | Status: AC
  Administered 2018-03-28: 1000 ug via INTRAMUSCULAR

## 2018-03-28 NOTE — Progress Notes (Signed)
Medical screening examination/treatment/procedure(s) were performed by non-physician practitioner and as supervising physician I was immediately available for consultation/collaboration. I agree with above. Maija Biggers, MD   

## 2018-04-09 ENCOUNTER — Other Ambulatory Visit: Payer: Self-pay | Admitting: *Deleted

## 2018-04-09 MED ORDER — DALFAMPRIDINE ER 10 MG PO TB12: 10.0000 mg | ORAL_TABLET | Freq: Two times a day (BID) | ORAL | 3 refills | Status: DC

## 2018-04-12 ENCOUNTER — Other Ambulatory Visit: Payer: Self-pay

## 2018-04-19 NOTE — Progress Notes (Signed)
Rcvd fax from [place] approved 03/17/2018 - 04/16/2019

## 2018-04-27 ENCOUNTER — Ambulatory Visit (INDEPENDENT_AMBULATORY_CARE_PROVIDER_SITE_OTHER): Payer: Medicare Other

## 2018-04-27 DIAGNOSIS — E538 Deficiency of other specified B group vitamins: Secondary | ICD-10-CM | POA: Diagnosis not present

## 2018-04-27 DIAGNOSIS — Z309 Encounter for contraceptive management, unspecified: Secondary | ICD-10-CM | POA: Diagnosis not present

## 2018-04-27 MED ORDER — CYANOCOBALAMIN 1000 MCG/ML IJ SOLN
1000.0000 ug | Freq: Once | INTRAMUSCULAR | Status: AC
  Administered 2018-04-27: 1000 ug via INTRAMUSCULAR

## 2018-04-27 MED ORDER — MEDROXYPROGESTERONE ACETATE 150 MG/ML IM SUSP
150.0000 mg | Freq: Once | INTRAMUSCULAR | Status: AC
  Administered 2018-04-27: 150 mg via INTRAMUSCULAR

## 2018-04-27 NOTE — Progress Notes (Signed)
Medical screening examination/treatment/procedure(s) were performed by non-physician practitioner and as supervising physician I was immediately available for consultation/collaboration. I agree with above. James John, MD   

## 2018-05-13 NOTE — Progress Notes (Signed)
[name] [address], [place] [address] Phone: [phone] Subjective:    I'm seeing this patient by the request  of:    I Kana Grandville Silos am serving as a scribe for Dr. Hulan Saas.   CC: Bilateral knee pain  DZH:[id number]  Courtney [name] is a 43 y.o. female coming in with complaint of bilateral knee pain. Left is worse. Left knee is the same issue as before. Right knee is popping. Pain radiates to her hip at times.   Onset- Chronic  Location- medical (left) Character- sharp, most of the time the pain is achy  Aggravating factors-  Reliving factors-  Therapies tried- [employer] out of 10    Patient had x-rays years ago that were independently visualized by me showing that patient did have patellofemoral arthritis.  Previous ultrasounds also showed severe patellofemoral arthritis   Past Medical History:  Diagnosis Date  . ALLERGIC RHINITIS 03/13/2007  . Allergy   . Anemia, B12 deficiency   . Anxiety   . C. difficile colitis   . CARPAL TUNNEL SYNDROME, BILATERAL 01/13/2010  . Cervical lesion 07/13/2011   Spine lesion - indeterminate, for f/u MRI may 2013 per neurology  . Chronic fatigue   . CTS (carpal tunnel syndrome)   . Diabetes mellitus without complication ([place])   . ESSENTIAL HYPERTENSION 03/13/2007  . Fibromyalgia   . GERD (gastroesophageal reflux disease)   . Heart murmur   . Hx of adenomatous polyp of colon 06/09/2009  . Hyperlipidemia   . HYPERLIPIDEMIA 03/13/2007  . Irritable bowel syndrome 05/24/2010  . Multiple sclerosis ([place])   . OBSTRUCTIVE SLEEP APNEA 12/08/2008  . Sleep apnea    uses c pap  . VITAMIN D DEFICIENCY 01/13/2010   Past Surgical History:  Procedure Laterality Date  . APPENDECTOMY  07/23/12  . CARPAL TUNNEL RELEASE  01/2010   bilateral  . COLONOSCOPY W/ BIOPSIES AND POLYPECTOMY  05/2009   2 tubular adenomas  . LAPAROSCOPIC APPENDECTOMY N/A 07/23/2012   Procedure: APPENDECTOMY LAPAROSCOPIC;   Surgeon: Gayland Curry, MD;  Location: [facility];  Service: General;  Laterality: N/A;   Social History   Socioeconomic History  . Marital status: Married    Spouse name: Not on file  . Number of children: 2  . Years of education: Not on file  . Highest education level: Not on file  Occupational History  . 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 Financial resource strain: Not on file  . Food insecurity:    Worry: Not on file    Inability: Not on file  . Transportation needs:    Medical: Not on file    Non-medical: Not on file  Tobacco Use  . Smoking status: Never Smoker  . Smokeless tobacco: Never Used  Substance and Sexual Activity  . Alcohol use: No    Alcohol/week: 0.0 standard drinks  . Drug use: No  . Sexual activity: Not on file  Lifestyle  . Physical activity:    Days per week: Not on file    Minutes per session: Not on file  . Stress: Not on file  Relationships  . Social connections:    Talks on phone: Not on file    Gets together: Not on file    Attends religious service: Not on file    Active member of club or organization: Not on file    Attends meetings of clubs or  organizations: Not on file    Relationship status: Not on file  Other Topics Concern  . Not on file  Social History Narrative   Daily Caffeine   Allergies  Allergen Reactions  . Ciprofloxacin In D5w Rash  . Penicillins     REACTION: rash  . Tricor [Fenofibrate] Rash   Family History  Problem Relation Age of Onset  . Migraines Mother   . Hypertension Father   . COPD Father   . Diabetes Father   . Prostate cancer Maternal Uncle        prostate cancer  . Lung cancer Maternal Grandmother   . Stroke Maternal Grandfather   . Heart attack Maternal Grandfather   . Diabetes Other   . Hypertension Other   . Asthma Daughter   . Colon cancer Neg Hx   . Esophageal cancer Neg Hx   . Rectal cancer Neg Hx   . Stomach cancer Neg Hx     Current Outpatient  Medications (Endocrine & Metabolic):  .  insulin lispro (HUMALOG KWIKPEN) 100 UNIT/ML KiwkPen, 3 times a day (just before each meal), 35-35-40 units, And pen needles 3/day .  medroxyPROGESTERone (DEPO-PROVERA) 150 MG/ML injection, Inject 150 mg into the muscle every 3 (three) months.   .  metFORMIN (GLUCOPHAGE-XR) 500 MG 24 hr tablet, Take 3 tablets (1,500 mg total) by mouth daily with breakfast. .  sitaGLIPtin (JANUVIA) 100 MG tablet, Take 1 tablet (100 mg total) by mouth at bedtime.  Current Outpatient Medications (Cardiovascular):  .  atorvastatin (LIPITOR) 20 MG tablet, TAKE 1 TABLET DAILY .  furosemide (LASIX) 40 MG tablet, Take 1 tablet (40 mg total) by mouth daily. .  metoprolol tartrate (LOPRESSOR) 100 MG tablet, TAKE 2 TABLETS DAILY  Current Outpatient Medications (Respiratory):  .  cetirizine (ZYRTEC) 10 MG tablet, Take 10 mg by mouth daily.    Current Outpatient Medications (Analgesics):  .  traMADol (ULTRAM) 50 MG tablet, Take 1 tablet (50 mg total) by mouth every 6 (six) hours as needed.  Current Outpatient Medications (Hematological):  .  cyanocobalamin (,VITAMIN B-12,) 1000 MCG/ML injection, Inject into the muscle every 30 (thirty) days.  Current Outpatient Medications (Other):  Marland Kitchen  B-D UF III MINI PEN NEEDLES 31G X 5 MM MISC,  .  baclofen (LIORESAL) 10 MG tablet, Take 10 mg by mouth daily at 2 PM daily at 2 PM. .  Cholecalciferol (VITAMIN D) 2000 UNITS CAPS, Take 2 capsules by mouth every morning. Reported on 06/05/2015 .  clotrimazole (MYCELEX) 10 MG troche, Take 1 tablet (10 mg total) by mouth 5 (five) times daily. Marland Kitchen  dalfampridine (AMPYRA) 10 MG TB12, Take 10 mg by mouth 2 (two) times daily. Marland Kitchen  dalfampridine 10 MG TB12, Take 1 tablet (10 mg total) by mouth 2 (two) times daily. Marland Kitchen  gabapentin (NEURONTIN) 300 MG capsule, TAKE 1 CAPSULE THREE TIMES A DAY .  glucose blood (FREESTYLE TEST STRIPS) test strip, 1 each by Other route 2 (two) times daily. And lancets, 2 times  daily. Marland Kitchen  glucose monitoring kit (FREESTYLE) monitoring kit, 1 each by Does not apply route as needed for other. Dispense any model that is covered- dispense testing supplies for Q AC/ HS accuchecks- 1 month supply with one refil. .  Lancets (FREESTYLE) lancets, Use to check blood sugar two time daily. Marland Kitchen  loperamide (IMODIUM) 2 MG capsule, Take 2 mg by mouth as needed for diarrhea or loose stools. .  nortriptyline (PAMELOR) 50 MG capsule, Take 2 capsules (100 mg  total) by mouth at bedtime. At bedtime .  omeprazole-sodium bicarbonate (ZEGERID) 40-1100 MG per capsule, Take 1 capsule by mouth daily.   .  potassium chloride (K-DUR,KLOR-CON) 10 MEQ tablet, Take 2 tablets (20 mEq total) by mouth daily. .  Teriflunomide (AUBAGIO) 14 MG TABS, Take 14 mg by mouth daily. .  Vitamin D, Ergocalciferol, (DRISDOL) 1.25 MG (50000 UT) CAPS capsule, Take 1 capsule (50,000 Units total) by mouth every 7 (seven) days.    Past medical history, social, surgical and family history all reviewed in electronic medical record.  No pertanent information unless stated regarding to the chief complaint.   Review of Systems:  No headache, visual changes, nausea, vomiting, diarrhea, constipation, dizziness, abdominal pain, skin rash, fevers, chills, night sweats, weight loss, swollen lymph nodes,, chest pain, shortness of breath, mood changes.  Positive muscle aches, joint swelling, body aches  Objective  Blood pressure 120/90, pulse 83, height '5\' 1"'  (1.549 m), weight 209 lb (94.8 kg), SpO2 98 %.   General: No apparent distress alert and oriented x3 mood and affect normal, dressed appropriately.  HEENT: Pupils equal, extraocular movements intact  Respiratory: Patient's speak in full sentences and does not appear short of breath  Cardiovascular: No lower extremity edema, non tender, no erythema  Skin: Warm dry intact with no signs of infection or rash on extremities or on axial skeleton.  Abdomen: Soft nontender  Neuro:  Cranial nerves II through XII are intact, neurovascularly intact in all extremities with 2+ DTRs and 2+ pulses.  Lymph: No lymphadenopathy of posterior or anterior cervical chain or axillae bilaterally.  Gait antalgic MSK:  Non tender with full range of motion and good stability and symmetric strength and tone of shoulders, elbows, wrist, hip, and ankles bilaterally.  Patient does have mild weakness though noted of the lower extremities bilaterally.  Likely secondary to her multiple sclerosis Knee: Bilateral valgus deformity noted.  Abnormal thigh to calf ratio.  Tender to palpation over medial and PF joint line.  ROM full in flexion and extension and lower leg rotation. instability with valgus force.  painful patellar compression. Patellar glide with moderate crepitus. Patellar and quadriceps tendons unremarkable. Hamstring and quadriceps strength is normal.   Limited musculoskeletal ultrasound was performed and interpreted by Lyndal Pulley  Limited ultrasound of patient's left knee shows severe bone-on-bone osteoarthritic changes of the patellofemoral joint with a trace effusion right knee also has some arthritic changes only of the patellofemoral joint. Impression: Bilateral knee arthritis left greater than right  After informed written and verbal consent, patient was seated on exam table. Right knee was prepped with alcohol swab and utilizing anterolateral approach, patient's right knee space was injected with 4:1  marcaine 0.5%: Kenalog 40m/dL. Patient tolerated the procedure well without immediate complications.  After informed written and verbal consent, patient was seated on exam table. Left knee was prepped with alcohol swab and utilizing anterolateral approach, patient's left knee space was injected with 4:1  marcaine 0.5%: Kenalog 429mdL. Patient tolerated the procedure well without immediate complications.   Impression and Recommendations:     This case required medical  decision making of moderate complexity. The above documentation has been reviewed and is accurate and complete ZaLyndal PulleyDO       Note: This dictation was prepared with Dragon dictation along with smaller phrase technology. Any transcriptional errors that result from this process are unintentional.

## 2018-05-14 ENCOUNTER — Ambulatory Visit (INDEPENDENT_AMBULATORY_CARE_PROVIDER_SITE_OTHER): Payer: Medicare Other | Admitting: Family Medicine

## 2018-05-14 ENCOUNTER — Encounter: Payer: Self-pay | Admitting: Family Medicine

## 2018-05-14 ENCOUNTER — Ambulatory Visit: Payer: Self-pay

## 2018-05-14 VITALS — BP 120/90 | HR 83 | Ht 61.0 in | Wt 209.0 lb

## 2018-05-14 DIAGNOSIS — G8929 Other chronic pain: Secondary | ICD-10-CM | POA: Diagnosis not present

## 2018-05-14 DIAGNOSIS — M170 Bilateral primary osteoarthritis of knee: Secondary | ICD-10-CM

## 2018-05-14 DIAGNOSIS — M25562 Pain in left knee: Principal | ICD-10-CM

## 2018-05-14 DIAGNOSIS — M25561 Pain in right knee: Secondary | ICD-10-CM

## 2018-05-14 MED ORDER — VITAMIN D (ERGOCALCIFEROL) 1.25 MG (50000 UNIT) PO CAPS: 50000.0000 [IU] | ORAL_CAPSULE | ORAL | 0 refills | Status: DC

## 2018-05-14 NOTE — Patient Instructions (Signed)
Good to see you  Ice is your friend Ice 20 minutes 2 times daily. Usually after activity and before bed. Stay active Exercises 3 times a week.  Biking would be good cardio  Once weekly vitamin D given and should be at your pharmacy  2 injections today  See me again in 4-6 weeks

## 2018-05-14 NOTE — Assessment & Plan Note (Signed)
Bilateral.  Worse in the patellofemoral left greater than right.  Injection given today, due to the instability noted especially of the left knee that a OA stability with a Tru pull H could be beneficial especially with patient's abnormal thigh to calf ratio.  Home exercises given, topical anti-inflammatories, icing regimen, encourage patient to monitor blood sugars a little closer over the course the next 3 days.  Follow-up again in 4 weeks.  Could be a candidate for Visco supplementation

## 2018-05-15 ENCOUNTER — Encounter: Payer: Self-pay | Admitting: Internal Medicine

## 2018-05-15 MED ORDER — GLUCOSE BLOOD VI STRP: 1.0000 | ORAL_STRIP | Freq: Two times a day (BID) | 3 refills | Status: AC

## 2018-05-15 MED ORDER — FREESTYLE LANCETS MISC: 2 refills | Status: DC

## 2018-05-24 ENCOUNTER — Encounter: Payer: Self-pay | Admitting: Internal Medicine

## 2018-05-24 NOTE — Telephone Encounter (Signed)
Courtney [name] - pt with concern about low sugars;  thanks

## 2018-05-28 ENCOUNTER — Ambulatory Visit (INDEPENDENT_AMBULATORY_CARE_PROVIDER_SITE_OTHER): Payer: Medicare Other

## 2018-05-28 DIAGNOSIS — E538 Deficiency of other specified B group vitamins: Secondary | ICD-10-CM | POA: Diagnosis not present

## 2018-05-28 MED ORDER — CYANOCOBALAMIN 1000 MCG/ML IJ SOLN
1000.0000 ug | Freq: Once | INTRAMUSCULAR | Status: AC
  Administered 2018-05-28: 1000 ug via INTRAMUSCULAR

## 2018-05-28 NOTE — Progress Notes (Signed)
Medical screening examination/treatment/procedure(s) were performed by non-physician practitioner and as supervising physician I was immediately available for consultation/collaboration. I agree with above. Dezra Mandella, MD   

## 2018-06-07 NOTE — Progress Notes (Signed)
[name] [name], [place] [address] Phone: [phone] Subjective:     CC: knee pai n  BTD:[id number]   05/14/2018: Bilateral.  Worse in the patellofemoral left greater than right.  Injection given today, due to the instability noted especially of the left knee that a OA stability with a Tru pull H could be beneficial especially with patient's abnormal thigh to calf ratio.  Home exercises given, topical anti-inflammatories, icing regimen, encourage patient to monitor blood sugars a little closer over the course the next 3 days.  Follow-up again in 4 weeks.  Could be a candidate for Visco supplementation  Update 06/11/2018: Courtney [name] is a 43 y.o. female coming in with complaint of bilateral knee pain. Patient states that the injections did help to alleviate her pain. Does occasionally have soreness in left knee.  Patient states that she is nearly 100% better at this point.    Past Medical History:  Diagnosis Date  . ALLERGIC RHINITIS 03/13/2007  . Allergy   . Anemia, B12 deficiency   . Anxiety   . C. difficile colitis   . CARPAL TUNNEL SYNDROME, BILATERAL 01/13/2010  . Cervical lesion 07/13/2011   Spine lesion - indeterminate, for f/u MRI may 2013 per neurology  . Chronic fatigue   . CTS (carpal tunnel syndrome)   . Diabetes mellitus without complication ([place])   . ESSENTIAL HYPERTENSION 03/13/2007  . Fibromyalgia   . GERD (gastroesophageal reflux disease)   . Heart murmur   . Hx of adenomatous polyp of colon 06/09/2009  . Hyperlipidemia   . HYPERLIPIDEMIA 03/13/2007  . Irritable bowel syndrome 05/24/2010  . Multiple sclerosis ([place])   . OBSTRUCTIVE SLEEP APNEA 12/08/2008  . Sleep apnea    uses c pap  . VITAMIN D DEFICIENCY 01/13/2010   Past Surgical History:  Procedure Laterality Date  . APPENDECTOMY  07/23/12  . CARPAL TUNNEL RELEASE  01/2010   bilateral  . COLONOSCOPY W/ BIOPSIES AND POLYPECTOMY  05/2009   2 tubular adenomas    . LAPAROSCOPIC APPENDECTOMY N/A 07/23/2012   Procedure: APPENDECTOMY LAPAROSCOPIC;  Surgeon: Gayland Curry, MD;  Location: [facility] OR;  Service: General;  Laterality: N/A;   Social History   Socioeconomic History  . Marital status: Married    Spouse name: Not on file  . Number of children: 2  . Years of education: Not on file  . Highest education level: Not on file  Occupational History  . 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 Financial resource strain: Not on file  . Food insecurity:    Worry: Not on file    Inability: Not on file  . Transportation needs:    Medical: Not on file    Non-medical: Not on file  Tobacco Use  . Smoking status: Never Smoker  . Smokeless tobacco: Never Used  Substance and Sexual Activity  . Alcohol use: No    Alcohol/week: 0.0 standard drinks  . Drug use: No  . Sexual activity: Not on file  Lifestyle  . Physical activity:    Days per week: Not on file    Minutes per session: Not on file  . Stress: Not on file  Relationships  . Social connections:    Talks on phone: Not on file    Gets together: Not on file    Attends religious service: Not on file    Active member of club or organization: Not  on file    Attends meetings of clubs or organizations: Not on file    Relationship status: Not on file  Other Topics Concern  . Not on file  Social History Narrative   Daily Caffeine   Allergies  Allergen Reactions  . Ciprofloxacin In D5w Rash  . Penicillins     REACTION: rash  . Tricor [Fenofibrate] Rash   Family History  Problem Relation Age of Onset  . Migraines Mother   . Hypertension Father   . COPD Father   . Diabetes Father   . Prostate cancer Maternal Uncle        prostate cancer  . Lung cancer Maternal Grandmother   . Stroke Maternal Grandfather   . Heart attack Maternal Grandfather   . Diabetes Other   . Hypertension Other   . Asthma Daughter   . Colon cancer Neg Hx   . Esophageal cancer  Neg Hx   . Rectal cancer Neg Hx   . Stomach cancer Neg Hx     Current Outpatient Medications (Endocrine & Metabolic):  .  insulin lispro (HUMALOG KWIKPEN) 100 UNIT/ML KiwkPen, 3 times a day (just before each meal), 35-35-40 units, And pen needles 3/day .  medroxyPROGESTERone (DEPO-PROVERA) 150 MG/ML injection, Inject 150 mg into the muscle every 3 (three) months.   .  metFORMIN (GLUCOPHAGE-XR) 500 MG 24 hr tablet, Take 3 tablets (1,500 mg total) by mouth daily with breakfast. .  sitaGLIPtin (JANUVIA) 100 MG tablet, Take 1 tablet (100 mg total) by mouth at bedtime.  Current Outpatient Medications (Cardiovascular):  .  atorvastatin (LIPITOR) 20 MG tablet, TAKE 1 TABLET DAILY .  furosemide (LASIX) 40 MG tablet, Take 1 tablet (40 mg total) by mouth daily. .  metoprolol tartrate (LOPRESSOR) 100 MG tablet, TAKE 2 TABLETS DAILY  Current Outpatient Medications (Respiratory):  .  cetirizine (ZYRTEC) 10 MG tablet, Take 10 mg by mouth daily.    Current Outpatient Medications (Analgesics):  .  traMADol (ULTRAM) 50 MG tablet, Take 1 tablet (50 mg total) by mouth every 6 (six) hours as needed.  Current Outpatient Medications (Hematological):  .  cyanocobalamin (,VITAMIN B-12,) 1000 MCG/ML injection, Inject into the muscle every 30 (thirty) days.  Current Outpatient Medications (Other):  Marland Kitchen  B-D UF III MINI PEN NEEDLES 31G X 5 MM MISC,  .  baclofen (LIORESAL) 10 MG tablet, Take 10 mg by mouth daily at 2 PM daily at 2 PM. .  Cholecalciferol (VITAMIN D) 2000 UNITS CAPS, Take 2 capsules by mouth every morning. Reported on 06/05/2015 .  clotrimazole (MYCELEX) 10 MG troche, Take 1 tablet (10 mg total) by mouth 5 (five) times daily. Marland Kitchen  dalfampridine (AMPYRA) 10 MG TB12, Take 10 mg by mouth 2 (two) times daily. Marland Kitchen  dalfampridine 10 MG TB12, Take 1 tablet (10 mg total) by mouth 2 (two) times daily. Marland Kitchen  gabapentin (NEURONTIN) 300 MG capsule, TAKE 1 CAPSULE THREE TIMES A DAY .  glucose blood (FREESTYLE TEST  STRIPS) test strip, 1 each by Other route 2 (two) times daily. And lancets, 2 times daily. Marland Kitchen  glucose monitoring kit (FREESTYLE) monitoring kit, 1 each by Does not apply route as needed for other. Dispense any model that is covered- dispense testing supplies for Q AC/ HS accuchecks- 1 month supply with one refil. .  Lancets (FREESTYLE) lancets, Use to check blood sugar two time daily. Marland Kitchen  loperamide (IMODIUM) 2 MG capsule, Take 2 mg by mouth as needed for diarrhea or loose stools. Marland Kitchen  nortriptyline (PAMELOR) 50 MG capsule, Take 2 capsules (100 mg total) by mouth at bedtime. At bedtime .  omeprazole-sodium bicarbonate (ZEGERID) 40-1100 MG per capsule, Take 1 capsule by mouth daily.   .  potassium chloride (K-DUR,KLOR-CON) 10 MEQ tablet, Take 2 tablets (20 mEq total) by mouth daily. .  Teriflunomide (AUBAGIO) 14 MG TABS, Take 14 mg by mouth daily. .  Vitamin D, Ergocalciferol, (DRISDOL) 1.25 MG (50000 UT) CAPS capsule, Take 1 capsule (50,000 Units total) by mouth every 7 (seven) days.    Past medical history, social, surgical and family history all reviewed in electronic medical record.  No pertanent information unless stated regarding to the chief complaint.   Review of Systems:  No headache, visual changes, nausea, vomiting, diarrhea, constipation, dizziness, abdominal pain, skin rash, fevers, chills, night sweats, weight loss, swollen lymph nodes, body aches, joint swelling, muscle aches, chest pain, shortness of breath, mood changes.   Objective  Blood pressure 128/80, pulse 76, height '5\' 1"'  (1.549 m), weight 211 lb (95.7 kg), SpO2 98 %.    General: No apparent distress alert and oriented x3 mood and affect normal, dressed appropriately.  HEENT: Pupils equal, extraocular movements intact  Respiratory: Patient's speak in full sentences and does not appear short of breath  Cardiovascular: No lower extremity edema, non tender, no erythema  Skin: Warm dry intact with no signs of infection or  rash on extremities or on axial skeleton.  Abdomen: Soft nontender  Neuro: Cranial nerves II through XII are intact, neurovascularly intact in all extremities with 2+ DTRs and 2+ pulses.  Lymph: No lymphadenopathy of posterior or anterior cervical chain or axillae bilaterally.  Gait normal with good balance and coordination.  MSK:  Non tender with full range of motion and good stability and symmetric strength and tone of shoulders, elbows, wrist, hip, and ankles bilaterally.  4+ out of 5 strength but patient's baseline Knee: Bilateral Normal to inspection with no erythema or effusion or obvious bony abnormalities. Palpation normal with no warmth, joint line tenderness, patellar tenderness, or condyle tenderness. ROM full in flexion and extension and lower leg rotation. Ligaments with solid consistent endpoints including ACL, PCL, LCL, MCL. Negative Mcmurray's, Apley's, and Thessalonian tests. Mild painful patellar compression.  Greater than right Patellar glide mild crepitus. Patellar and quadriceps tendons unremarkable. Hamstring and quadriceps strength is normal.   Impression and Recommendations:     . The above documentation has been reviewed and is accurate and complete Lyndal Pulley, DO       Note: This dictation was prepared with Dragon dictation along with smaller phrase technology. Any transcriptional errors that result from this process are unintentional.

## 2018-06-11 ENCOUNTER — Ambulatory Visit (INDEPENDENT_AMBULATORY_CARE_PROVIDER_SITE_OTHER): Payer: Medicare Other | Admitting: Family Medicine

## 2018-06-11 DIAGNOSIS — M170 Bilateral primary osteoarthritis of knee: Secondary | ICD-10-CM | POA: Diagnosis not present

## 2018-06-11 NOTE — Patient Instructions (Signed)
Good to see you  Ice is your friend Stay active Keep working on the exercises  Ice after a lot of activity  Se eme again in 2 months if worsening otherwise see me when you need me

## 2018-06-11 NOTE — Assessment & Plan Note (Signed)
Patient did have approval for the Visco supplementation.  Patient wanted to hold at this time.  Patient is doing much better.  We discussed icing regimen and home exercises, topical anti-inflammatories.  Worsening symptoms will consider the Visco supplementation.  Follow-up again as needed

## 2018-06-12 ENCOUNTER — Other Ambulatory Visit: Payer: Self-pay | Admitting: Endocrinology

## 2018-06-26 ENCOUNTER — Ambulatory Visit (INDEPENDENT_AMBULATORY_CARE_PROVIDER_SITE_OTHER): Payer: Medicare Other | Admitting: *Deleted

## 2018-06-26 DIAGNOSIS — E538 Deficiency of other specified B group vitamins: Secondary | ICD-10-CM | POA: Diagnosis not present

## 2018-06-26 MED ORDER — CYANOCOBALAMIN 1000 MCG/ML IJ SOLN
1000.0000 ug | Freq: Once | INTRAMUSCULAR | Status: AC
  Administered 2018-06-26: 1000 ug via INTRAMUSCULAR

## 2018-07-03 ENCOUNTER — Other Ambulatory Visit: Payer: Self-pay | Admitting: Neurology

## 2018-07-03 NOTE — Progress Notes (Signed)
Medical screening examination/treatment/procedure(s) were performed by non-physician practitioner and as supervising physician I was immediately available for consultation/collaboration. I agree with above. James John, MD   

## 2018-07-04 ENCOUNTER — Other Ambulatory Visit: Payer: Self-pay | Admitting: Neurology

## 2018-07-04 ENCOUNTER — Ambulatory Visit: Payer: Medicare Other | Admitting: Internal Medicine

## 2018-07-16 ENCOUNTER — Other Ambulatory Visit: Payer: Self-pay

## 2018-07-16 MED ORDER — BACLOFEN 10 MG PO TABS: 10.0000 mg | ORAL_TABLET | Freq: Three times a day (TID) | ORAL | 2 refills | Status: DC

## 2018-07-24 ENCOUNTER — Encounter: Payer: Self-pay | Admitting: Neurology

## 2018-07-26 ENCOUNTER — Ambulatory Visit (INDEPENDENT_AMBULATORY_CARE_PROVIDER_SITE_OTHER): Payer: Medicare Other

## 2018-07-26 ENCOUNTER — Telehealth: Payer: Self-pay

## 2018-07-26 DIAGNOSIS — Z309 Encounter for contraceptive management, unspecified: Secondary | ICD-10-CM

## 2018-07-26 DIAGNOSIS — E538 Deficiency of other specified B group vitamins: Secondary | ICD-10-CM | POA: Diagnosis not present

## 2018-07-26 MED ORDER — CYANOCOBALAMIN 1000 MCG/ML IJ SOLN
1000.0000 ug | Freq: Once | INTRAMUSCULAR | Status: AC
  Administered 2018-07-26: 1000 ug via INTRAMUSCULAR

## 2018-07-26 MED ORDER — MEDROXYPROGESTERONE ACETATE 150 MG/ML IM SUSP
150.0000 mg | Freq: Once | INTRAMUSCULAR | Status: AC
  Administered 2018-07-26: 150 mg via INTRAMUSCULAR

## 2018-07-26 NOTE — Progress Notes (Signed)
Medical screening examination/treatment/procedure(s) were performed by non-physician practitioner and as supervising physician I was immediately available for consultation/collaboration. I agree with above. James John, MD   

## 2018-07-26 NOTE — Telephone Encounter (Signed)
Left message asking patient to call back, please transfer call to [place] office, needs to speak with [facility]---

## 2018-07-30 ENCOUNTER — Other Ambulatory Visit: Payer: Self-pay | Admitting: Internal Medicine

## 2018-07-30 ENCOUNTER — Ambulatory Visit: Payer: Medicare Other

## 2018-07-30 NOTE — Progress Notes (Signed)
Virtual Visit via Video Note The purpose of this virtual visit is to provide medical care while limiting exposure to the novel coronavirus.    Consent was obtained for video visit:  yes Answered questions that patient had about telehealth interaction:  yes I discussed the limitations, risks, security and privacy concerns of performing an evaluation and management service by telemedicine. I also discussed with the patient that there may be a patient responsible charge related to this service. The patient expressed understanding and agreed to proceed.  Pt location: Home Physician Location: office Name of referring provider:  Biagio Borg, MD I connected with Edson Snowball at patients initiation/request on 07/31/2018 at 10:00 AM EDT by video enabled telemedicine application and verified that I am speaking with the correct person using two identifiers. Pt MRN:  [id number] Pt DOB:  1975/09/12 Video Participants:  Edson Snowball   History of Present Illness:  Courtney [name] is a 43 year old right-handed Caucasian woman who follows up for multiple sclerosis.  UPDATE: Due to mild progression of disease on MRI, I suggested switching DMT.  Considered Ocrevus.  However, Ms. Cullom wished to remain on Aubagio.  Current DMT:  Aubagio Other current medications: Baclofen 10 mg, gabapentin 665m/300mg/600mg, D3 8000 IU daily, B12 10075m injection every 30 days, nortriptyline 100 mg, tramadol 50 mg (sparingly)  Vision:No Motor:Residual right upper and lower extremity weakness Sensory:facial paresthesias has resolved with gabapentin Pain:Occasional pain and tightness in her lower extremities. Gait:okay. Bowel/Bladder:No change Fatigue:No Cognition:Sometimes she has trouble remembering new information Mood:Good  HISTORY: Onset of symptoms and diagnosis occurred in 2006. She experienced an episode of numbness in her right arm and both lower extremities. She was dropping objects. MRI  of brain reportedly showed solitary right mid-to-posterior frontal periventricular white matter lesion. MRI of spine revealed no cord demyelination. CSF analysis from lumbar puncture reportedly revealed oligoclonal bands and an elevated IgG index. She was diagnosed with clinically isolated syndrome but was officially diagnosed with multiple sclerosis a few months later when she developed a relapse with left hand numbness. In February 2013, she developed left arm pain and swelling. MRI of cervical spine revealed an enhancing spinal cord lesion at C4 on the right. She was treated with IV Solumedrol. In 2014, she had an episode of optic neuritis in her right eye, treated with IV Solumedrol.  In September-October 2019, she reported to 4 weeks history of tingling in her face, which may occur off and on for 10 minutes 3 times a week.  She was prescribed a prednisone taper.  Compared to prior MRI from 02/07/2017, repeat MRI of the brain and cervical spine with and without contrast from 02/19/2018, which were personally reviewed, demonstrated mildly progressed bifrontal cerebral white matter changes as well as probable additional abnormal signal in the left cord at the C6-7 level but no abnormal enhancement to suggest active demyelinating disease. Questionable if paresthesias were secondary to B12 deficiency as well.  There is no family history of MS.  Past disease modifying therapy: Avonex (difficulty injecting), Rebif (painful injections), Tecfidera (GI side effects)  Imaging: 06/03/11: MRI brain with and without contrast: single right frontal periventricular white matter lesion with high T2 FLAIR signal 06/03/11: MRI cervical spine with and without contrast: abnormal rim enhancing lesion in the spinal cord at the level of C4 on the right which mildly expands the cord. 01/21/12: MRI brain with and without contrast: unchanged nonspecific right periventricular white matter lesion 01/21/12: MRI  cervical spine with and  and without contrast: stable nonenhancing cord lesion at C4 11/14/12: MRI brain with and without contrast. No change 11/14/12: MRI cervical spine with and without contrast: No change 12/02/13: MRI brain with and without contrast: 1. No substantial change in the right frontal juxtacortical, right corona radiata, and left periatrial white matter T2/FLAIR hyperintensities. While nonspecific, these likely reflect demyelinating lesions in this patient with a history of multiple sclerosis and concomitant cervical cord lesion. No new lesions are identified and none of the existing lesions enhance or restrict diffusion. 2. Subcentimeter left parafalcine dural based lesion as described above has slowly enlarged over time and demonstrates equivocal enhancement, but may represent a meningioma.Recommend attention on follow up imaging. 12/02/13: MRI cervical spine with and without contrast: No change 12/15/14: MRI brain with and without contrast: 1.Similar appearance of focal T2/FLAIR hyperintense white matter lesion within the right lateral body of the corpus callosum without associated enhancement to suggest active demyelination. 2.No new or enlarging white matter lesions identified. 3.Slowly enlarging nonenhancing nodular lesion along the left anterior aspect of the falx cerebri, which may reflect a small meningioma. 12/15/14: MRI cervical spine with and without contrast: no change 10/10/15: MRI brain with and without contrast: no change 10/10/15: MRI cervical spine with and without contrast: no change 02/07/17: MRI brain with and without contrast: No significant change since 12/24/2008 02/07/2017: MRI cervical spine with and without contrast: Chronic lesion at C4 level, stable since 2013 02/19/18:  MRI brain with and without contrast: Compared to prior imaging from 02/07/17, mildly progressed bifrontal cerebral white matter changes, right greater than left, but no abnormal  enhancement to suggest active disease. 02/19/18:  MRI cervical spine with and without contrast: Again demonstrated chronic lesion at C4 level as well as probable subtle patchy cord signal abnormality within the left cord at level of C6-7 not definitely seen on prior imaging from 02/07/17 but no abnormal enhancement to suggest active disease.  Past Medical History: Past Medical History:  Diagnosis Date   ALLERGIC RHINITIS 03/13/2007   Allergy    Anemia, B12 deficiency    Anxiety    C. difficile colitis    CARPAL TUNNEL SYNDROME, BILATERAL 01/13/2010   Cervical lesion 07/13/2011   Spine lesion - indeterminate, for f/u MRI may 2013 per neurology   Chronic fatigue    CTS (carpal tunnel syndrome)    Diabetes mellitus without complication ([place])    ESSENTIAL HYPERTENSION 03/13/2007   Fibromyalgia    GERD (gastroesophageal reflux disease)    Heart murmur    Hx of adenomatous polyp of colon 06/09/2009   Hyperlipidemia    HYPERLIPIDEMIA 03/13/2007   Irritable bowel syndrome 05/24/2010   Multiple sclerosis ([place])    OBSTRUCTIVE SLEEP APNEA 12/08/2008   Sleep apnea    uses c pap   VITAMIN D DEFICIENCY 01/13/2010    Medications: Outpatient Encounter Medications as of 07/31/2018  Medication Sig   atorvastatin (LIPITOR) 20 MG tablet TAKE 1 TABLET DAILY   AUBAGIO 14 MG TABS TAKE 1 TABLET DAILY   B-D UF III MINI PEN NEEDLES 31G X 5 MM MISC USE TO INJECT HUMALOG KWIKPEN INTO THE SKIN THREE TIMES A DAY   baclofen (LIORESAL) 10 MG tablet Take 10 mg by mouth daily at 2 PM daily at 2 PM.   baclofen (LIORESAL) 10 MG tablet Take 1 tablet (10 mg total) by mouth 3 (three) times daily.   cetirizine (ZYRTEC) 10 MG tablet Take 10 mg by mouth daily.     Cholecalciferol (VITAMIN D) 2000  CAPS Take 2 capsules by mouth every morning. Reported on 06/05/2015   clotrimazole (MYCELEX) 10 MG troche Take 1 tablet (10 mg total) by mouth 5 (five) times daily.   cyanocobalamin (,VITAMIN  B-12,) 1000 MCG/ML injection Inject into the muscle every 30 (thirty) days.   dalfampridine (AMPYRA) 10 MG TB12 Take 10 mg by mouth 2 (two) times daily.   dalfampridine 10 MG TB12 Take 1 tablet (10 mg total) by mouth 2 (two) times daily.   furosemide (LASIX) 40 MG tablet Take 1 tablet (40 mg total) by mouth daily.   gabapentin (NEURONTIN) 300 MG capsule TAKE 1 CAPSULE THREE TIMES A DAY   glucose blood (FREESTYLE TEST STRIPS) test strip 1 each by Other route 2 (two) times daily. And lancets, 2 times daily.   glucose monitoring kit (FREESTYLE) monitoring kit 1 each by Does not apply route as needed for other. Dispense any model that is covered- dispense testing supplies for Q AC/ HS accuchecks- 1 month supply with one refil.   HUMALOG KWIKPEN 100 UNIT/ML KwikPen INJECT 35 UNITS TOTAL INTO THE SKIN THREE TIMES A DAY WITH MEALS   insulin lispro (HUMALOG KWIKPEN) 100 UNIT/ML KiwkPen 3 times a day (just before each meal), 35-35-40 units, And pen needles 3/day   Lancets (FREESTYLE) lancets Use to check blood sugar two time daily.   loperamide (IMODIUM) 2 MG capsule Take 2 mg by mouth as needed for diarrhea or loose stools.   medroxyPROGESTERone (DEPO-PROVERA) 150 MG/ML injection Inject 150 mg into the muscle every 3 (three) months.     metFORMIN (GLUCOPHAGE-XR) 500 MG 24 hr tablet Take 3 tablets (1,500 mg total) by mouth daily with breakfast.   metoprolol tartrate (LOPRESSOR) 100 MG tablet TAKE 2 TABLETS DAILY   nortriptyline (PAMELOR) 50 MG capsule Take 2 capsules (100 mg total) by mouth at bedtime. At bedtime   omeprazole-sodium bicarbonate (ZEGERID) 40-1100 MG per capsule Take 1 capsule by mouth daily.     potassium chloride (K-DUR,KLOR-CON) 10 MEQ tablet Take 2 tablets (20 mEq total) by mouth daily.   sitaGLIPtin (JANUVIA) 100 MG tablet Take 1 tablet (100 mg total) by mouth at bedtime.   traMADol (ULTRAM) 50 MG tablet TAKE 1 TABLET BY MOUTH EVERY 6 HOURS AS NEEDED   Vitamin D,  Ergocalciferol, (DRISDOL) 1.25 MG (50000 UT) CAPS capsule Take 1 capsule (50,000 Units total) by mouth every 7 (seven) days.   No facility-administered encounter medications on file as of 07/31/2018.     Allergies: Allergies  Allergen Reactions   Ciprofloxacin In D5w Rash   Penicillins     REACTION: rash   Tricor [Fenofibrate] Rash    Family History: Family History  Problem Relation Age of Onset   Migraines Mother    Hypertension Father    COPD Father    Diabetes Father    Prostate cancer Maternal Uncle        prostate cancer   Lung cancer Maternal Grandmother    Stroke Maternal Grandfather    Heart attack Maternal Grandfather    Diabetes Other    Hypertension Other    Asthma Daughter    Colon cancer Neg Hx    Esophageal cancer Neg Hx    Rectal cancer Neg Hx    Stomach cancer Neg Hx     Social History: Social History   Socioeconomic History   Marital status: Married    Spouse name: Not on file   Number of children: 2   Years of education: Not on file  file   Highest education level: Not on file  Occupational History   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    Employer: UNEMPLOYED  Social Designer, fashion/clothing strain: Not on file   Food insecurity:    Worry: Not on file    Inability: Not on file   Transportation needs:    Medical: Not on file    Non-medical: Not on file  Tobacco Use   Smoking status: Never Smoker   Smokeless tobacco: Never Used  Substance and Sexual Activity   Alcohol use: No    Alcohol/week: 0.0 standard drinks   Drug use: No   Sexual activity: Not on file  Lifestyle   Physical activity:    Days per week: Not on file    Minutes per session: Not on file   Stress: Not on file  Relationships   Social connections:    Talks on phone: Not on file    Gets together: Not on file    Attends religious service: Not on file    Active member of club or organization: Not on file    Attends meetings of clubs or  organizations: Not on file    Relationship status: Not on file   Intimate partner violence:    Fear of current or ex partner: Not on file    Emotionally abused: Not on file    Physically abused: Not on file    Forced sexual activity: Not on file  Other Topics Concern   Not on file  Social History Narrative   Daily Caffeine   Observations/Objective:   Blood pressure 138/89, pulse 86, SpO2 97 %. alert and oriented to person, place, and time. Attention span and concentration intact, recent and remote memory intact, fund of knowledge intact.  Speech fluent and not dysarthric, language intact.  Eyes orthophoric and move in all directions.  Face symmetric.  Facial sensation intact.  No pronator drift.  Romberg with mild sway.  Assessment and Plan:   Multiple sclerosis  1.  Continue Aubagio.  Repeat hepatic panel and vitamin D level now. 2.  Refill gabapentin 666m/300mg/600mg and nortriptyline 1088mat bedtime 3.  Repeat MRI of brain and cervical spine with and without contrast, hepatic panel and vitamin D level in 6 months.  If there is further disease progression on repeat MRI, then I would have her reconsider switching DMT. 4.  Follow up after repeat testing.  Follow Up Instructions:    -I discussed the assessment and treatment plan with the patient. The patient was provided an opportunity to ask questions and all were answered. The patient agreed with the plan and demonstrated an understanding of the instructions.   The patient was advised to call back or seek an in-person evaluation if the symptoms worsen or if the condition fails to improve as anticipated.    Total Time spent in visit with the patient was:  15 minutes.  AdDudley MajorDO

## 2018-07-31 ENCOUNTER — Telehealth (INDEPENDENT_AMBULATORY_CARE_PROVIDER_SITE_OTHER): Payer: Medicare Other | Admitting: Neurology

## 2018-07-31 ENCOUNTER — Encounter: Payer: Self-pay | Admitting: *Deleted

## 2018-07-31 ENCOUNTER — Ambulatory Visit: Payer: Medicare Other | Admitting: Neurology

## 2018-07-31 ENCOUNTER — Encounter: Payer: Self-pay | Admitting: Neurology

## 2018-07-31 ENCOUNTER — Other Ambulatory Visit: Payer: Self-pay

## 2018-07-31 VITALS — BP 138/89 | HR 86

## 2018-07-31 DIAGNOSIS — Z79899 Other long term (current) drug therapy: Secondary | ICD-10-CM | POA: Diagnosis not present

## 2018-07-31 DIAGNOSIS — R6889 Other general symptoms and signs: Secondary | ICD-10-CM | POA: Diagnosis not present

## 2018-07-31 DIAGNOSIS — E559 Vitamin D deficiency, unspecified: Secondary | ICD-10-CM | POA: Diagnosis not present

## 2018-07-31 DIAGNOSIS — G35 Multiple sclerosis: Secondary | ICD-10-CM

## 2018-07-31 MED ORDER — GABAPENTIN 300 MG PO CAPS: ORAL_CAPSULE | ORAL | 3 refills | Status: DC

## 2018-07-31 MED ORDER — NORTRIPTYLINE HCL 50 MG PO CAPS: 100.0000 mg | ORAL_CAPSULE | Freq: Every day | ORAL | 3 refills | Status: DC

## 2018-07-31 NOTE — Patient Instructions (Signed)
1.  Continue Aubagio.  Repeat hepatic panel and vitamin D level now. 2.  Refilled gabapentin 600mg /300mg /600mg  and nortriptyline 100mg  at bedtime 3.  Repeat MRI of brain and cervical spine with and without contrast, hepatic panel and vitamin D level in 6 months.  4.  Follow up after repeat testing.

## 2018-08-08 ENCOUNTER — Other Ambulatory Visit: Payer: Self-pay | Admitting: *Deleted

## 2018-08-08 DIAGNOSIS — G35 Multiple sclerosis: Secondary | ICD-10-CM

## 2018-08-08 DIAGNOSIS — Z79899 Other long term (current) drug therapy: Secondary | ICD-10-CM

## 2018-08-08 DIAGNOSIS — E559 Vitamin D deficiency, unspecified: Secondary | ICD-10-CM

## 2018-08-08 DIAGNOSIS — R6889 Other general symptoms and signs: Secondary | ICD-10-CM

## 2018-08-08 NOTE — Progress Notes (Unsigned)
mri

## 2018-08-15 ENCOUNTER — Ambulatory Visit: Payer: Medicare Other | Admitting: Family Medicine

## 2018-09-04 ENCOUNTER — Other Ambulatory Visit: Payer: Self-pay | Admitting: Endocrinology

## 2018-09-07 ENCOUNTER — Ambulatory Visit (INDEPENDENT_AMBULATORY_CARE_PROVIDER_SITE_OTHER): Payer: Medicare Other | Admitting: Endocrinology

## 2018-09-07 ENCOUNTER — Other Ambulatory Visit: Payer: Self-pay

## 2018-09-07 ENCOUNTER — Encounter: Payer: Self-pay | Admitting: Endocrinology

## 2018-09-07 DIAGNOSIS — Z794 Long term (current) use of insulin: Secondary | ICD-10-CM | POA: Diagnosis not present

## 2018-09-07 DIAGNOSIS — E119 Type 2 diabetes mellitus without complications: Secondary | ICD-10-CM

## 2018-09-07 MED ORDER — INSULIN LISPRO (1 UNIT DIAL) 100 UNIT/ML (KWIKPEN): 45.0000 [IU] | PEN_INJECTOR | Freq: Three times a day (TID) | SUBCUTANEOUS | 3 refills | Status: DC

## 2018-09-07 NOTE — Progress Notes (Signed)
Subjective:    Patient ID: Courtney [name], female    DOB: [birth year], 43 y.o.   MRN: [id number]  HPI  telehealth visit today via doxy video visit.  Alternatives to telehealth are presented to this patient, and the patient agrees to the telehealth visit. Pt is advised of the cost of the visit, and agrees to this, also.   Patient is at home, and I am at the office.   Persons attending the telehealth visit: the patient and I Pt returns for f/u of diabetes mellitus: DM type: Insulin-requiring type 2 Dx'ed: 2016, when she presented with nonketotic hyperosmolar hyperglycemic state, but not DKA.   Complications: none Therapy: insulin since 2018, and 2 oral meds.  GDM: never. DKA: never. Severe hypoglycemia: never.  Pancreatitis: never.   Other: she takes multiple daily injections; metformin dosage is limited by nausea.   She declines weight loss surgery.  she brings a record of her cbg's which I have reviewed today.  It varies from 99-190.  It is in general highest fasting (higher than at HS).   Pt takes 35-35-30 units.  She says cbg's are in the 200's.  There is no trend throughout the day.  Pt says she does not miss the insulin, but she sometimes eats just 2 meals per day.    Past Medical History:  Diagnosis Date  . ALLERGIC RHINITIS 03/13/2007  . Allergy   . Anemia, B12 deficiency   . Anxiety   . C. difficile colitis   . CARPAL TUNNEL SYNDROME, BILATERAL 01/13/2010  . Cervical lesion 07/13/2011   Spine lesion - indeterminate, for f/u MRI may 2013 per neurology  . Chronic fatigue   . CTS (carpal tunnel syndrome)   . Diabetes mellitus without complication ([place])   . ESSENTIAL HYPERTENSION 03/13/2007  . Fibromyalgia   . GERD (gastroesophageal reflux disease)   . Heart murmur   . Hx of adenomatous polyp of colon 06/09/2009  . Hyperlipidemia   . HYPERLIPIDEMIA 03/13/2007  . Irritable bowel syndrome 05/24/2010  . Multiple sclerosis ([place])   . OBSTRUCTIVE SLEEP APNEA 12/08/2008  . Sleep  apnea    uses c pap  . VITAMIN D DEFICIENCY 01/13/2010    Past Surgical History:  Procedure Laterality Date  . APPENDECTOMY  07/23/12  . CARPAL TUNNEL RELEASE  01/2010   bilateral  . COLONOSCOPY W/ BIOPSIES AND POLYPECTOMY  05/2009   2 tubular adenomas  . LAPAROSCOPIC APPENDECTOMY N/A 07/23/2012   Procedure: APPENDECTOMY LAPAROSCOPIC;  Surgeon: Gayland Curry, MD;  Location: [facility] OR;  Service: General;  Laterality: N/A;    Social History   Socioeconomic History  . Marital status: Married    Spouse name: Not on file  . Number of children: 2  . Years of education: Not on file  . Highest education level: Not on file  Occupational History  . 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 Financial resource strain: Not on file  . Food insecurity:    Worry: Not on file    Inability: Not on file  . Transportation needs:    Medical: Not on file    Non-medical: Not on file  Tobacco Use  . Smoking status: Never Smoker  . Smokeless tobacco: Never Used  Substance and Sexual Activity  . Alcohol use: No    Alcohol/week: 0.0 standard drinks  . Drug use: No  . Sexual activity: Not on file  Lifestyle  . Physical activity:  Days per week: Not on file    Minutes per session: Not on file  . Stress: Not on file  Relationships  . Social connections:    Talks on phone: Not on file    Gets together: Not on file    Attends religious service: Not on file    Active member of club or organization: Not on file    Attends meetings of clubs or organizations: Not on file    Relationship status: Not on file  . Intimate partner violence:    Fear of current or ex partner: Not on file    Emotionally abused: Not on file    Physically abused: Not on file    Forced sexual activity: Not on file  Other Topics Concern  . Not on file  Social History Narrative   Daily Caffeine    Current Outpatient Medications on File Prior to Visit  Medication Sig Dispense Refill  .  atorvastatin (LIPITOR) 20 MG tablet TAKE 1 TABLET DAILY 90 tablet 1  . AUBAGIO 14 MG TABS TAKE 1 TABLET DAILY 90 tablet 3  . B-D UF III MINI PEN NEEDLES 31G X 5 MM MISC USE TO INJECT HUMALOG KWIKPEN INTO THE SKIN THREE TIMES A DAY 90 each 0  . baclofen (LIORESAL) 10 MG tablet Take 10 mg by mouth daily at 2 PM daily at 2 PM.    . baclofen (LIORESAL) 10 MG tablet Take 1 tablet (10 mg total) by mouth 3 (three) times daily. 270 each 2  . cetirizine (ZYRTEC) 10 MG tablet Take 10 mg by mouth daily.      . Cholecalciferol (VITAMIN D) 2000 UNITS CAPS Take 2 capsules by mouth every morning. Reported on 06/05/2015    . clotrimazole (MYCELEX) 10 MG troche Take 1 tablet (10 mg total) by mouth 5 (five) times daily. 35 tablet 2  . cyanocobalamin (,VITAMIN B-12,) 1000 MCG/ML injection Inject into the muscle every 30 (thirty) days.    Marland Kitchen dalfampridine (AMPYRA) 10 MG TB12 Take 10 mg by mouth 2 (two) times daily.    Marland Kitchen dalfampridine 10 MG TB12 Take 1 tablet (10 mg total) by mouth 2 (two) times daily. 180 tablet 3  . furosemide (LASIX) 40 MG tablet Take 1 tablet (40 mg total) by mouth daily. 90 tablet 3  . gabapentin (NEURONTIN) 300 MG capsule 2 capsules in AM, 1 capsule in afternoon, 2 capsules in PM 450 capsule 3  . glucose blood (FREESTYLE TEST STRIPS) test strip 1 each by Other route 2 (two) times daily. And lancets, 2 times daily. 180 each 3  . glucose monitoring kit (FREESTYLE) monitoring kit 1 each by Does not apply route as needed for other. Dispense any model that is covered- dispense testing supplies for Q AC/ HS accuchecks- 1 month supply with one refil. 1 each 1  . Lancets (FREESTYLE) lancets Use to check blood sugar two time daily. 150 each 2  . loperamide (IMODIUM) 2 MG capsule Take 2 mg by mouth as needed for diarrhea or loose stools.    . medroxyPROGESTERone (DEPO-PROVERA) 150 MG/ML injection Inject 150 mg into the muscle every 3 (three) months.      . metFORMIN (GLUCOPHAGE-XR) 500 MG 24 hr tablet Take 3  tablets (1,500 mg total) by mouth daily with breakfast. 270 tablet 3  . metoprolol tartrate (LOPRESSOR) 100 MG tablet TAKE 2 TABLETS DAILY 180 tablet 1  . nortriptyline (PAMELOR) 50 MG capsule Take 2 capsules (100 mg total) by mouth at bedtime. At  bedtime 180 capsule 3  . omeprazole-sodium bicarbonate (ZEGERID) 40-1100 MG per capsule Take 1 capsule by mouth daily.      . potassium chloride (K-DUR,KLOR-CON) 10 MEQ tablet Take 2 tablets (20 mEq total) by mouth daily. 180 tablet 3  . sitaGLIPtin (JANUVIA) 100 MG tablet Take 1 tablet (100 mg total) by mouth at bedtime. 90 tablet 4  . traMADol (ULTRAM) 50 MG tablet TAKE 1 TABLET BY MOUTH EVERY 6 HOURS AS NEEDED 20 tablet 5  . Vitamin D, Ergocalciferol, (DRISDOL) 1.25 MG (50000 UT) CAPS capsule Take 1 capsule (50,000 Units total) by mouth every 7 (seven) days. 12 capsule 0   No current facility-administered medications on file prior to visit.     Allergies  Allergen Reactions  . Ciprofloxacin In D5w Rash  . Penicillins     REACTION: rash  . Tricor [Fenofibrate] Rash    Family History  Problem Relation Age of Onset  . Migraines Mother   . Hypertension Father   . COPD Father   . Diabetes Father   . Prostate cancer Maternal Uncle        prostate cancer  . Lung cancer Maternal Grandmother   . Stroke Maternal Grandfather   . Heart attack Maternal Grandfather   . Diabetes Other   . Hypertension Other   . Asthma Daughter   . Colon cancer Neg Hx   . Esophageal cancer Neg Hx   . Rectal cancer Neg Hx   . Stomach cancer Neg Hx       Review of Systems She denies hypoglycemia.     Objective:   Physical Exam    Lab Results  Component Value Date   HGBA1C 7.4 (H) 01/03/2018   Lab Results  Component Value Date   CREATININE 0.60 01/03/2018   BUN 9 01/03/2018   NA 142 01/03/2018   K 2.9 (L) 01/03/2018   CL 101 01/03/2018   CO2 33 (H) 01/03/2018       Assessment & Plan:  Insulin-requiring type 2 DM: worse   Patient  Instructions  check your blood sugar twice a day.  vary the time of day when you check, between before the 3 meals, and at bedtime.  also check if you have symptoms of your blood sugar being too high or too low.  please keep a record of the readings and bring it to your next appointment here.  You can write it on any piece of paper.   Please increase the insulin to 45 units 3 times a day (just before each meal).   Please come back for a follow-up appointment in 4-6 weeks.

## 2018-09-07 NOTE — Patient Instructions (Addendum)
check your blood sugar twice a day.  vary the time of day when you check, between before the 3 meals, and at bedtime.  also check if you have symptoms of your blood sugar being too high or too low.  please keep a record of the readings and bring it to your next appointment here.  You can write it on any piece of paper.   Please increase the insulin to 45 units 3 times a day (just before each meal).   Please come back for a follow-up appointment in 4-6 weeks.

## 2018-09-13 ENCOUNTER — Ambulatory Visit (INDEPENDENT_AMBULATORY_CARE_PROVIDER_SITE_OTHER): Payer: Medicare Other

## 2018-09-13 DIAGNOSIS — E538 Deficiency of other specified B group vitamins: Secondary | ICD-10-CM | POA: Diagnosis not present

## 2018-09-13 MED ORDER — CYANOCOBALAMIN 1000 MCG/ML IJ SOLN
1000.0000 ug | Freq: Once | INTRAMUSCULAR | Status: AC
  Administered 2018-09-13: 1000 ug via INTRAMUSCULAR

## 2018-09-13 NOTE — Progress Notes (Signed)
Medical screening examination/treatment/procedure(s) were performed by non-physician practitioner and as supervising physician I was immediately available for consultation/collaboration. I agree with above. James John, MD   

## 2018-09-23 ENCOUNTER — Other Ambulatory Visit: Payer: Self-pay | Admitting: Internal Medicine

## 2018-10-12 ENCOUNTER — Encounter: Payer: Self-pay | Admitting: Internal Medicine

## 2018-10-15 ENCOUNTER — Ambulatory Visit (INDEPENDENT_AMBULATORY_CARE_PROVIDER_SITE_OTHER): Payer: Medicare Other

## 2018-10-15 DIAGNOSIS — E538 Deficiency of other specified B group vitamins: Secondary | ICD-10-CM

## 2018-10-15 MED ORDER — CYANOCOBALAMIN 1000 MCG/ML IJ SOLN
1000.0000 ug | Freq: Once | INTRAMUSCULAR | Status: AC
  Administered 2018-10-15: 1000 ug via INTRAMUSCULAR

## 2018-10-15 NOTE — Progress Notes (Signed)
Medical screening examination/treatment/procedure(s) were performed by non-physician practitioner and as supervising physician I was immediately available for consultation/collaboration. I agree with above. James John, MD   

## 2018-10-15 NOTE — Telephone Encounter (Signed)
[place] for Depoprovera shot today after urine pregnancy testing at time of service.  Also to get b12 shot.

## 2018-10-26 ENCOUNTER — Ambulatory Visit (INDEPENDENT_AMBULATORY_CARE_PROVIDER_SITE_OTHER): Payer: Medicare Other

## 2018-10-26 ENCOUNTER — Telehealth: Payer: Self-pay

## 2018-10-26 ENCOUNTER — Other Ambulatory Visit (INDEPENDENT_AMBULATORY_CARE_PROVIDER_SITE_OTHER): Payer: Medicare Other

## 2018-10-26 ENCOUNTER — Other Ambulatory Visit: Payer: Self-pay

## 2018-10-26 DIAGNOSIS — E559 Vitamin D deficiency, unspecified: Secondary | ICD-10-CM

## 2018-10-26 DIAGNOSIS — Z79899 Other long term (current) drug therapy: Secondary | ICD-10-CM

## 2018-10-26 DIAGNOSIS — G35 Multiple sclerosis: Secondary | ICD-10-CM

## 2018-10-26 DIAGNOSIS — Z309 Encounter for contraceptive management, unspecified: Secondary | ICD-10-CM

## 2018-10-26 DIAGNOSIS — R6889 Other general symptoms and signs: Secondary | ICD-10-CM

## 2018-10-26 LAB — POCT URINE PREGNANCY: Preg Test, Ur: NEGATIVE

## 2018-10-26 LAB — HEPATIC FUNCTION PANEL
ALT: 20 U/L (ref 0–35)
AST: 18 U/L (ref 0–37)
Albumin: 4 g/dL (ref 3.5–5.2)
Alkaline Phosphatase: 63 U/L (ref 39–117)
Bilirubin, Direct: 0.1 mg/dL (ref 0.0–0.3)
Total Bilirubin: 0.4 mg/dL (ref 0.2–1.2)
Total Protein: 6.7 g/dL (ref 6.0–8.3)

## 2018-10-26 LAB — VITAMIN D 25 HYDROXY (VIT D DEFICIENCY, FRACTURES): VITD: 39.63 ng/mL (ref 30.00–100.00)

## 2018-10-26 MED ORDER — MEDROXYPROGESTERONE ACETATE 150 MG/ML IM SUSP
150.0000 mg | Freq: Once | INTRAMUSCULAR | Status: AC
  Administered 2018-10-26: 150 mg via INTRAMUSCULAR

## 2018-10-26 NOTE — Telephone Encounter (Signed)
-----   Message from Pieter Partridge, DO sent at 10/26/2018  1:46 PM EDT ----- Labs are ok.  Vitamin D level is still a little low (it's 39.63.  I would ideally like it above 50).  She previously reported that she takes D3 8000 IU daily.  If this is correct, then I would like her to increase dose to 9000 IU daily and recheck just prior to follow up (about a week prior to follow up along with the other labs)

## 2018-10-26 NOTE — Telephone Encounter (Signed)
Called and spoke with Pt, advised her of lab results, increase in D#, and labs for October. Pt verbalized understanding.

## 2018-10-29 NOTE — Progress Notes (Signed)
Medical screening examination/treatment/procedure(s) were performed by non-physician practitioner and as supervising physician I was immediately available for consultation/collaboration. I agree with above. James John, MD   

## 2018-11-12 ENCOUNTER — Ambulatory Visit (INDEPENDENT_AMBULATORY_CARE_PROVIDER_SITE_OTHER): Payer: Medicare Other

## 2018-11-12 DIAGNOSIS — E538 Deficiency of other specified B group vitamins: Secondary | ICD-10-CM

## 2018-11-12 MED ORDER — CYANOCOBALAMIN 1000 MCG/ML IJ SOLN
1000.0000 ug | Freq: Once | INTRAMUSCULAR | Status: AC
  Administered 2018-11-12: 1000 ug via INTRAMUSCULAR

## 2018-11-14 NOTE — Progress Notes (Signed)
Medical screening examination/treatment/procedure(s) were performed by non-physician practitioner and as supervising physician I was immediately available for consultation/collaboration. I agree with above. Majesta Leichter, MD   

## 2018-12-14 ENCOUNTER — Ambulatory Visit (INDEPENDENT_AMBULATORY_CARE_PROVIDER_SITE_OTHER): Payer: Medicare Other

## 2018-12-14 DIAGNOSIS — E538 Deficiency of other specified B group vitamins: Secondary | ICD-10-CM | POA: Diagnosis not present

## 2018-12-14 MED ORDER — CYANOCOBALAMIN 1000 MCG/ML IJ SOLN
1000.0000 ug | Freq: Once | INTRAMUSCULAR | Status: AC
  Administered 2018-12-14: 1000 ug via INTRAMUSCULAR

## 2018-12-14 NOTE — Progress Notes (Signed)
Medical screening examination/treatment/procedure(s) were performed by non-physician practitioner and as supervising physician I was immediately available for consultation/collaboration. I agree with above. Amron Guerrette, MD   

## 2018-12-16 ENCOUNTER — Other Ambulatory Visit: Payer: Self-pay | Admitting: Endocrinology

## 2018-12-16 ENCOUNTER — Other Ambulatory Visit: Payer: Self-pay | Admitting: Internal Medicine

## 2018-12-20 ENCOUNTER — Ambulatory Visit: Payer: Medicare Other

## 2019-01-14 ENCOUNTER — Ambulatory Visit: Payer: Medicare Other

## 2019-01-17 ENCOUNTER — Ambulatory Visit (INDEPENDENT_AMBULATORY_CARE_PROVIDER_SITE_OTHER): Payer: Medicare Other

## 2019-01-17 ENCOUNTER — Other Ambulatory Visit: Payer: Self-pay | Admitting: *Deleted

## 2019-01-17 DIAGNOSIS — Z20822 Contact with and (suspected) exposure to covid-19: Secondary | ICD-10-CM

## 2019-01-17 DIAGNOSIS — E538 Deficiency of other specified B group vitamins: Secondary | ICD-10-CM

## 2019-01-17 DIAGNOSIS — Z23 Encounter for immunization: Secondary | ICD-10-CM

## 2019-01-17 MED ORDER — CYANOCOBALAMIN 1000 MCG/ML IJ SOLN
1000.0000 ug | Freq: Once | INTRAMUSCULAR | Status: AC
  Administered 2019-01-17: 1000 ug via INTRAMUSCULAR

## 2019-01-18 LAB — NOVEL CORONAVIRUS, NAA

## 2019-01-22 NOTE — Progress Notes (Signed)
Medical screening examination/treatment/procedure(s) were performed by non-physician practitioner and as supervising physician I was immediately available for consultation/collaboration. I agree with above. Derick Seminara, MD   

## 2019-01-25 ENCOUNTER — Ambulatory Visit: Payer: Medicare Other

## 2019-01-30 ENCOUNTER — Ambulatory Visit: Payer: Medicare Other | Admitting: Neurology

## 2019-02-03 ENCOUNTER — Telehealth: Payer: Self-pay | Admitting: Endocrinology

## 2019-02-03 ENCOUNTER — Other Ambulatory Visit: Payer: Self-pay | Admitting: Internal Medicine

## 2019-02-03 NOTE — Telephone Encounter (Signed)
1.  Please schedule f/u appt 2.  Then please refill x 1, pending that appt.  

## 2019-02-04 NOTE — Telephone Encounter (Signed)
Per Dr. Loanne Drilling, unable to refill Januvia without an appt. Routing this message to the front desk for scheduling purposes.

## 2019-02-06 NOTE — Telephone Encounter (Signed)
LMTCB to schedule appt

## 2019-02-12 ENCOUNTER — Other Ambulatory Visit: Payer: Self-pay

## 2019-02-12 DIAGNOSIS — Z794 Long term (current) use of insulin: Secondary | ICD-10-CM

## 2019-02-12 DIAGNOSIS — E119 Type 2 diabetes mellitus without complications: Secondary | ICD-10-CM

## 2019-02-12 MED ORDER — SITAGLIPTIN PHOSPHATE 100 MG PO TABS: 100.0000 mg | ORAL_TABLET | Freq: Every day | ORAL | 0 refills | Status: DC

## 2019-02-12 NOTE — Telephone Encounter (Signed)
sitaGLIPtin (JANUVIA) 100 MG tablet 30 tablet 0 02/12/2019    Sig - Route: Take 1 tablet (100 mg total) by mouth daily. DUE FOR AN APPT - Oral   Sent to pharmacy as: sitaGLIPtin (JANUVIA) 100 MG tablet   Notes to Pharmacy: Overdue for an appt. Will provide 30 day supply. Future refill requests will require an appt   E-Prescribing Status: Receipt confirmed by pharmacy (02/12/2019 11:38 AM EDT)

## 2019-02-12 NOTE — Telephone Encounter (Signed)
Patient is scheduled for appointment on 02/19/19 at 10:00 a.m.

## 2019-02-15 ENCOUNTER — Other Ambulatory Visit: Payer: Self-pay

## 2019-02-17 ENCOUNTER — Other Ambulatory Visit: Payer: Self-pay | Admitting: Internal Medicine

## 2019-02-18 ENCOUNTER — Other Ambulatory Visit: Payer: Self-pay

## 2019-02-18 ENCOUNTER — Ambulatory Visit (INDEPENDENT_AMBULATORY_CARE_PROVIDER_SITE_OTHER): Payer: Medicare Other

## 2019-02-18 DIAGNOSIS — E538 Deficiency of other specified B group vitamins: Secondary | ICD-10-CM | POA: Diagnosis not present

## 2019-02-18 MED ORDER — CYANOCOBALAMIN 1000 MCG/ML IJ SOLN
1000.0000 ug | Freq: Once | INTRAMUSCULAR | Status: AC
  Administered 2019-02-18: 1000 ug via INTRAMUSCULAR

## 2019-02-18 NOTE — Progress Notes (Signed)
Medical screening examination/treatment/procedure(s) were performed by non-physician practitioner and as supervising physician I was immediately available for consultation/collaboration. I agree with above. James John, MD   

## 2019-02-19 ENCOUNTER — Encounter: Payer: Self-pay | Admitting: Endocrinology

## 2019-02-19 ENCOUNTER — Ambulatory Visit (INDEPENDENT_AMBULATORY_CARE_PROVIDER_SITE_OTHER): Payer: Medicare Other | Admitting: Endocrinology

## 2019-02-19 VITALS — BP 130/80 | HR 76 | Ht 61.0 in | Wt 201.4 lb

## 2019-02-19 DIAGNOSIS — E119 Type 2 diabetes mellitus without complications: Secondary | ICD-10-CM | POA: Diagnosis not present

## 2019-02-19 DIAGNOSIS — Z794 Long term (current) use of insulin: Secondary | ICD-10-CM | POA: Diagnosis not present

## 2019-02-19 LAB — POCT GLYCOSYLATED HEMOGLOBIN (HGB A1C): Hemoglobin A1C: 6.4 % — AB (ref 4.0–5.6)

## 2019-02-19 MED ORDER — INSULIN LISPRO (1 UNIT DIAL) 100 UNIT/ML (KWIKPEN): 22.0000 [IU] | PEN_INJECTOR | Freq: Two times a day (BID) | SUBCUTANEOUS | 3 refills | Status: DC

## 2019-02-19 NOTE — Patient Instructions (Addendum)
check your blood sugar twice a day.  vary the time of day when you check, between before the 3 meals, and at bedtime.  also check if you have symptoms of your blood sugar being too high or too low.  please keep a record of the readings and bring it to your next appointment here.  You can write it on any piece of paper.   Please reduce the insulin to 22 units twice a day (just before each meal).   Please come back for a follow-up appointment in 3 months.

## 2019-02-19 NOTE — Progress Notes (Signed)
Subjective:    Patient ID: Courtney [name], female    DOB: 03/21/1976, 43 y.o.   MRN: [id number]  HPI Pt returns for f/u of diabetes mellitus: DM type: Insulin-requiring type 2 Dx'ed: 2016, when she presented with nonketotic hyperosmolar hyperglycemic state, but not DKA.   Complications: none Therapy: insulin since 2018, and 2 oral meds.  GDM: never. DKA: never. Severe hypoglycemia: never.  Pancreatitis: never.   Other: she takes multiple daily injections; metformin dosage is limited by nausea.   She declines weight loss surgery.   Interval history: Pt says she does not miss the insulin, but she sometimes eats just 2 meals per day.  She has mild hypoglycemia approx twice per month.  This happens at any time of day.   Past Medical History:  Diagnosis Date  . ALLERGIC RHINITIS 03/13/2007  . Allergy   . Anemia, B12 deficiency   . Anxiety   . C. difficile colitis   . CARPAL TUNNEL SYNDROME, BILATERAL 01/13/2010  . Cervical lesion 07/13/2011   Spine lesion - indeterminate, for f/u MRI may 2013 per neurology  . Chronic fatigue   . CTS (carpal tunnel syndrome)   . Diabetes mellitus without complication ([place])   . ESSENTIAL HYPERTENSION 03/13/2007  . Fibromyalgia   . GERD (gastroesophageal reflux disease)   . Heart murmur   . Hx of adenomatous polyp of colon 06/09/2009  . Hyperlipidemia   . HYPERLIPIDEMIA 03/13/2007  . Irritable bowel syndrome 05/24/2010  . Multiple sclerosis ([place])   . OBSTRUCTIVE SLEEP APNEA 12/08/2008  . Sleep apnea    uses c pap  . VITAMIN D DEFICIENCY 01/13/2010    Past Surgical History:  Procedure Laterality Date  . APPENDECTOMY  07/23/12  . CARPAL TUNNEL RELEASE  01/2010   bilateral  . COLONOSCOPY W/ BIOPSIES AND POLYPECTOMY  05/2009   2 tubular adenomas  . LAPAROSCOPIC APPENDECTOMY N/A 07/23/2012   Procedure: APPENDECTOMY LAPAROSCOPIC;  Surgeon: Gayland Curry, MD;  Location: [facility] OR;  Service: General;  Laterality: N/A;    Social History   Socioeconomic  History  . Marital status: Single    Spouse name: Not on file  . Number of children: 2  . Years of education: Not on file  . Highest education level: Not on file  Occupational History  . 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 Financial resource strain: Not on file  . Food insecurity    Worry: Not on file    Inability: Not on file  . Transportation needs    Medical: Not on file    Non-medical: Not on file  Tobacco Use  . Smoking status: Never Smoker  . Smokeless tobacco: Never Used  Substance and Sexual Activity  . Alcohol use: No    Alcohol/week: 0.0 standard drinks  . Drug use: No  . Sexual activity: Not on file  Lifestyle  . Physical activity    Days per week: Not on file    Minutes per session: Not on file  . Stress: Not on file  Relationships  . Social Herbalist on phone: Not on file    Gets together: Not on file    Attends religious service: Not on file    Active member of club or organization: Not on file    Attends meetings of clubs or organizations: Not on file    Relationship status: Not on file  . Intimate partner violence  Fear of current or ex partner: Not on file    Emotionally abused: Not on file    Physically abused: Not on file    Forced sexual activity: Not on file  Other Topics Concern  . Not on file  Social History Narrative   Daily Caffeine    Current Outpatient Medications on File Prior to Visit  Medication Sig Dispense Refill  . atorvastatin (LIPITOR) 20 MG tablet TAKE 1 TABLET DAILY 90 tablet 1  . AUBAGIO 14 MG TABS TAKE 1 TABLET DAILY 90 tablet 3  . B-D UF III MINI PEN NEEDLES 31G X 5 MM MISC USE TO INJECT HUMALOG KWIKPEN INTO THE SKIN THREE TIMES A DAY 90 each 0  . baclofen (LIORESAL) 10 MG tablet Take 1 tablet (10 mg total) by mouth 3 (three) times daily. 270 each 2  . cetirizine (ZYRTEC) 10 MG tablet Take 10 mg by mouth daily.      . Cholecalciferol (VITAMIN D) 2000 UNITS CAPS Take 2  capsules by mouth every morning. Reported on 06/05/2015    . clotrimazole (MYCELEX) 10 MG troche Take 1 tablet (10 mg total) by mouth 5 (five) times daily. 35 tablet 2  . cyanocobalamin (,VITAMIN B-12,) 1000 MCG/ML injection Inject into the muscle every 30 (thirty) days.    Marland Kitchen dalfampridine (AMPYRA) 10 MG TB12 Take 10 mg by mouth 2 (two) times daily.    . furosemide (LASIX) 40 MG tablet Take 1 tablet (40 mg total) by mouth daily. 90 tablet 3  . gabapentin (NEURONTIN) 300 MG capsule 2 capsules in AM, 1 capsule in afternoon, 2 capsules in PM 450 capsule 3  . glucose blood (FREESTYLE TEST STRIPS) test strip 1 each by Other route 2 (two) times daily. And lancets, 2 times daily. 180 each 3  . glucose monitoring kit (FREESTYLE) monitoring kit 1 each by Does not apply route as needed for other. Dispense any model that is covered- dispense testing supplies for Q AC/ HS accuchecks- 1 month supply with one refil. 1 each 1  . Lancets (FREESTYLE) lancets Use to check blood sugar two time daily. 150 each 2  . loperamide (IMODIUM) 2 MG capsule Take 2 mg by mouth as needed for diarrhea or loose stools.    . medroxyPROGESTERone (DEPO-PROVERA) 150 MG/ML injection Inject 150 mg into the muscle every 3 (three) months.      . metFORMIN (GLUCOPHAGE-XR) 500 MG 24 hr tablet Take 3 tablets (1,500 mg total) by mouth daily with breakfast. (OFFICE VISIT DUE) 90 tablet 0  . metoprolol tartrate (LOPRESSOR) 100 MG tablet TAKE 2 TABLETS DAILY 180 tablet 1  . nortriptyline (PAMELOR) 50 MG capsule Take 2 capsules (100 mg total) by mouth at bedtime. At bedtime 180 capsule 3  . omeprazole-sodium bicarbonate (ZEGERID) 40-1100 MG per capsule Take 1 capsule by mouth daily.      . potassium chloride (K-DUR,KLOR-CON) 10 MEQ tablet Take 2 tablets (20 mEq total) by mouth daily. 180 tablet 3  . sitaGLIPtin (JANUVIA) 100 MG tablet Take 1 tablet (100 mg total) by mouth daily. DUE FOR AN APPT 30 tablet 0  . traMADol (ULTRAM) 50 MG tablet TAKE 1  TABLET BY MOUTH EVERY 6 HOURS AS NEEDED 20 tablet 5  . Vitamin D, Ergocalciferol, (DRISDOL) 1.25 MG (50000 UT) CAPS capsule Take 1 capsule (50,000 Units total) by mouth every 7 (seven) days. 12 capsule 0   No current facility-administered medications on file prior to visit.     Allergies  Allergen Reactions  .  Ciprofloxacin In D5w Rash  . Penicillins     REACTION: rash  . Tricor [Fenofibrate] Rash    Family History  Problem Relation Age of Onset  . Migraines Mother   . Hypertension Father   . COPD Father   . Diabetes Father   . Prostate cancer Maternal Uncle        prostate cancer  . Lung cancer Maternal Grandmother   . Stroke Maternal Grandfather   . Heart attack Maternal Grandfather   . Diabetes Other   . Hypertension Other   . Asthma Daughter   . Colon cancer Neg Hx   . Esophageal cancer Neg Hx   . Rectal cancer Neg Hx   . Stomach cancer Neg Hx     BP 130/80 (BP Location: Left Arm, Patient Position: Sitting, Cuff Size: Large)   Pulse 76   Ht '5\' 1"'  (1.549 m)   Wt 201 lb 6.4 oz (91.4 kg)   SpO2 98%   BMI 38.05 kg/m   Review of Systems Denies LOC.  She has lost a few lbs, due to her efforts.      Objective:   Physical Exam VITAL SIGNS:  See vs page GENERAL: no distress Pulses: dorsalis pedis intact bilat.   MSK: no deformity of the feet CV: no leg edema Skin:  no ulcer on the feet.  normal color and temp on the feet. Neuro: sensation is intact to touch on the feet  Lab Results  Component Value Date   HGBA1C 6.4 (A) 02/19/2019   Lab Results  Component Value Date   CREATININE 0.60 01/03/2018   BUN 9 01/03/2018   NA 142 01/03/2018   K 2.9 (L) 01/03/2018   CL 101 01/03/2018   CO2 33 (H) 01/03/2018       Assessment & Plan:  Insulin-requiring type 2 DM: overcontrolled.  Hypoglycemia: this limits aggressiveness of glycemic control.    Patient Instructions  check your blood sugar twice a day.  vary the time of day when you check, between before the  3 meals, and at bedtime.  also check if you have symptoms of your blood sugar being too high or too low.  please keep a record of the readings and bring it to your next appointment here.  You can write it on any piece of paper.   Please reduce the insulin to 22 units twice a day (just before each meal).   Please come back for a follow-up appointment in 3 months.

## 2019-02-20 ENCOUNTER — Ambulatory Visit (INDEPENDENT_AMBULATORY_CARE_PROVIDER_SITE_OTHER): Payer: Medicare Other

## 2019-02-20 ENCOUNTER — Other Ambulatory Visit: Payer: Self-pay

## 2019-02-20 DIAGNOSIS — Z30019 Encounter for initial prescription of contraceptives, unspecified: Secondary | ICD-10-CM | POA: Diagnosis not present

## 2019-02-20 LAB — POCT URINE PREGNANCY: Preg Test, Ur: NEGATIVE

## 2019-02-20 MED ORDER — MEDROXYPROGESTERONE ACETATE 150 MG/ML IM SUSP
150.0000 mg | Freq: Once | INTRAMUSCULAR | Status: AC
  Administered 2019-02-20: 150 mg via INTRAMUSCULAR

## 2019-02-25 NOTE — Progress Notes (Signed)
Medical screening examination/treatment/procedure(s) were performed by non-physician practitioner and as supervising physician I was immediately available for consultation/collaboration. I agree with above. James John, MD   

## 2019-03-17 ENCOUNTER — Other Ambulatory Visit: Payer: Self-pay | Admitting: Internal Medicine

## 2019-03-19 ENCOUNTER — Other Ambulatory Visit: Payer: Self-pay

## 2019-03-19 DIAGNOSIS — E119 Type 2 diabetes mellitus without complications: Secondary | ICD-10-CM

## 2019-03-19 DIAGNOSIS — Z794 Long term (current) use of insulin: Secondary | ICD-10-CM

## 2019-03-19 MED ORDER — SITAGLIPTIN PHOSPHATE 100 MG PO TABS: 100.0000 mg | ORAL_TABLET | Freq: Every day | ORAL | 0 refills | Status: DC

## 2019-03-20 ENCOUNTER — Other Ambulatory Visit: Payer: Self-pay

## 2019-03-20 ENCOUNTER — Ambulatory Visit (INDEPENDENT_AMBULATORY_CARE_PROVIDER_SITE_OTHER): Payer: Medicare Other | Admitting: *Deleted

## 2019-03-20 DIAGNOSIS — E538 Deficiency of other specified B group vitamins: Secondary | ICD-10-CM | POA: Diagnosis not present

## 2019-03-20 MED ORDER — CYANOCOBALAMIN 1000 MCG/ML IJ SOLN
1000.0000 ug | Freq: Once | INTRAMUSCULAR | Status: AC
  Administered 2019-03-20: 1000 ug via INTRAMUSCULAR

## 2019-03-20 NOTE — Progress Notes (Signed)
Medical screening examination/treatment/procedure(s) were performed by non-physician practitioner and as supervising physician I was immediately available for consultation/collaboration. I agree with above. James John, MD   

## 2019-04-05 ENCOUNTER — Other Ambulatory Visit: Payer: Self-pay

## 2019-04-05 MED ORDER — DALFAMPRIDINE ER 10 MG PO TB12: 10.0000 mg | ORAL_TABLET | Freq: Two times a day (BID) | ORAL | 1 refills | Status: DC

## 2019-04-16 ENCOUNTER — Telehealth: Payer: Self-pay | Admitting: Neurology

## 2019-04-16 NOTE — Telephone Encounter (Signed)
Patient's pharmacy called requesting a call back to clarify the prescription for Dalfampridine 10 MG.

## 2019-04-16 NOTE — Telephone Encounter (Signed)
Line busy at 246

## 2019-04-17 NOTE — Telephone Encounter (Signed)
Spoke with pharmacy and they wanted to confirm that it was ok to give generic.  Verbal given.

## 2019-04-22 ENCOUNTER — Ambulatory Visit (INDEPENDENT_AMBULATORY_CARE_PROVIDER_SITE_OTHER): Payer: Medicare Other

## 2019-04-22 ENCOUNTER — Other Ambulatory Visit: Payer: Self-pay

## 2019-04-22 DIAGNOSIS — E538 Deficiency of other specified B group vitamins: Secondary | ICD-10-CM | POA: Diagnosis not present

## 2019-04-22 MED ORDER — CYANOCOBALAMIN 1000 MCG/ML IJ SOLN
1000.0000 ug | Freq: Once | INTRAMUSCULAR | Status: AC
  Administered 2019-04-22: 12:00:00 1000 ug via INTRAMUSCULAR

## 2019-04-22 NOTE — Progress Notes (Signed)
Medical screening examination/treatment/procedure(s) were performed by non-physician practitioner and as supervising physician I was immediately available for consultation/collaboration. I agree with above. James John, MD   

## 2019-04-29 ENCOUNTER — Other Ambulatory Visit: Payer: Self-pay | Admitting: *Deleted

## 2019-04-29 ENCOUNTER — Encounter: Payer: Self-pay | Admitting: Internal Medicine

## 2019-04-29 MED ORDER — FUROSEMIDE 40 MG PO TABS: 40.0000 mg | ORAL_TABLET | Freq: Every day | ORAL | 0 refills | Status: DC

## 2019-05-20 ENCOUNTER — Other Ambulatory Visit: Payer: Self-pay | Admitting: Internal Medicine

## 2019-05-20 ENCOUNTER — Other Ambulatory Visit: Payer: Self-pay

## 2019-05-20 NOTE — Telephone Encounter (Signed)
Pt see Dr. Loanne Drilling will forward to him for renewal../lmb

## 2019-05-22 ENCOUNTER — Ambulatory Visit (INDEPENDENT_AMBULATORY_CARE_PROVIDER_SITE_OTHER): Payer: Medicare Other | Admitting: Endocrinology

## 2019-05-22 ENCOUNTER — Encounter: Payer: Self-pay | Admitting: Endocrinology

## 2019-05-22 ENCOUNTER — Other Ambulatory Visit: Payer: Self-pay

## 2019-05-22 VITALS — BP 140/88 | HR 94 | Ht 61.0 in | Wt 208.2 lb

## 2019-05-22 DIAGNOSIS — E119 Type 2 diabetes mellitus without complications: Secondary | ICD-10-CM | POA: Diagnosis not present

## 2019-05-22 DIAGNOSIS — Z794 Long term (current) use of insulin: Secondary | ICD-10-CM | POA: Diagnosis not present

## 2019-05-22 LAB — BASIC METABOLIC PANEL
BUN: 11 mg/dL (ref 6–23)
CO2: 28 mEq/L (ref 19–32)
Calcium: 9.6 mg/dL (ref 8.4–10.5)
Chloride: 102 mEq/L (ref 96–112)
Creatinine, Ser: 0.72 mg/dL (ref 0.40–1.20)
GFR: 88.4 mL/min (ref >60.00)
Glucose, Bld: 134 mg/dL — ABNORMAL HIGH (ref 70–99)
Potassium: 3.6 mEq/L (ref 3.5–5.1)
Sodium: 140 mEq/L (ref 135–145)

## 2019-05-22 LAB — LIPID PANEL
Cholesterol: 155 mg/dL (ref 0–200)
HDL: 39.8 mg/dL (ref >39.00)
Total CHOL/HDL Ratio: 4
Triglycerides: 416 mg/dL — ABNORMAL HIGH (ref 0.0–149.0)

## 2019-05-22 LAB — TSH: TSH: 1.63 u[IU]/mL (ref 0.35–4.50)

## 2019-05-22 LAB — POCT GLYCOSYLATED HEMOGLOBIN (HGB A1C): Hemoglobin A1C: 7.7 % — AB (ref 4.0–5.6)

## 2019-05-22 LAB — LDL CHOLESTEROL, DIRECT: Direct LDL: 70 mg/dL

## 2019-05-22 MED ORDER — CANAGLIFLOZIN 100 MG PO TABS: 100.0000 mg | ORAL_TABLET | Freq: Every day | ORAL | 3 refills | Status: DC

## 2019-05-22 NOTE — Progress Notes (Signed)
Subjective:    Patient ID: Courtney [name], female    DOB: Aug 05, 1975, 44 y.o.   MRN: [id number]  HPI Pt returns for f/u of diabetes mellitus: DM type: Insulin-requiring type 2 Dx'ed: 2016, when she presented with nonketotic hyperosmolar hyperglycemic state, but not DKA.   Complications: none Therapy: insulin since 2018, and 2 oral meds.  GDM: never. DKA: never. Severe hypoglycemia: never.  Pancreatitis: never.   Other: she takes multiple daily injections; metformin dosage is limited by nausea (which also precludes GLP rx).  She declines weight loss surgery.   Interval history: Pt says she does not miss the insulin, but she sometimes eats just 2 meals per day.   no cbg record, but states cbg's vary from 130-285.  There is no trend throughout the day.  Past Medical History:  Diagnosis Date  . ALLERGIC RHINITIS 03/13/2007  . Allergy   . Anemia, B12 deficiency   . Anxiety   . C. difficile colitis   . CARPAL TUNNEL SYNDROME, BILATERAL 01/13/2010  . Cervical lesion 07/13/2011   Spine lesion - indeterminate, for f/u MRI may 2013 per neurology  . Chronic fatigue   . CTS (carpal tunnel syndrome)   . Diabetes mellitus without complication ([place])   . ESSENTIAL HYPERTENSION 03/13/2007  . Fibromyalgia   . GERD (gastroesophageal reflux disease)   . Heart murmur   . Hx of adenomatous polyp of colon 06/09/2009  . Hyperlipidemia   . HYPERLIPIDEMIA 03/13/2007  . Irritable bowel syndrome 05/24/2010  . Multiple sclerosis ([place])   . OBSTRUCTIVE SLEEP APNEA 12/08/2008  . Sleep apnea    uses c pap  . VITAMIN D DEFICIENCY 01/13/2010    Past Surgical History:  Procedure Laterality Date  . APPENDECTOMY  07/23/12  . CARPAL TUNNEL RELEASE  01/2010   bilateral  . COLONOSCOPY W/ BIOPSIES AND POLYPECTOMY  05/2009   2 tubular adenomas  . LAPAROSCOPIC APPENDECTOMY N/A 07/23/2012   Procedure: APPENDECTOMY LAPAROSCOPIC;  Surgeon: Gayland Curry, MD;  Location: [facility] OR;  Service: General;  Laterality: N/A;     Social History   Socioeconomic History  . Marital status: Single    Spouse name: Not on file  . Number of children: 2  . Years of education: Not on file  . Highest education level: Not on file  Occupational History  . 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 Smoking status: Never Smoker  . Smokeless tobacco: Never Used  Substance and Sexual Activity  . Alcohol use: No    Alcohol/week: 0.0 standard drinks  . Drug use: No  . Sexual activity: Not on file  Other Topics Concern  . Not on file  Social History Narrative   Daily Caffeine   Social Determinants of Health   Financial Resource Strain:   . Difficulty of Paying Living Expenses: Not on file  Food Insecurity:   . Worried About Charity fundraiser in the Last Year: Not on file  . Ran Out of Food in the Last Year: Not on file  Transportation Needs:   . Lack of Transportation (Medical): Not on file  . Lack of Transportation (Non-Medical): Not on file  Physical Activity:   . Days of Exercise per Week: Not on file  . Minutes of Exercise per Session: Not on file  Stress:   . Feeling of Stress : Not on file  Social Connections:   . Frequency of Communication with Friends and Family: Not on  file  . Frequency of Social Gatherings with Friends and Family: Not on file  . Attends Religious Services: Not on file  . Active Member of Clubs or Organizations: Not on file  . Attends Archivist Meetings: Not on file  . Marital Status: Not on file  Intimate Partner Violence:   . Fear of Current or Ex-Partner: Not on file  . Emotionally Abused: Not on file  . Physically Abused: Not on file  . Sexually Abused: Not on file    Current Outpatient Medications on File Prior to Visit  Medication Sig Dispense Refill  . atorvastatin (LIPITOR) 20 MG tablet TAKE 1 TABLET DAILY 90 tablet 1  . AUBAGIO 14 MG TABS TAKE 1 TABLET DAILY 90 tablet 3  . B-D UF III MINI PEN NEEDLES 31G X 5 MM MISC USE  TO INJECT HUMALOG KWIKPEN INTO THE SKIN THREE TIMES A DAY 90 each 0  . baclofen (LIORESAL) 10 MG tablet Take 1 tablet (10 mg total) by mouth 3 (three) times daily. 270 each 2  . cetirizine (ZYRTEC) 10 MG tablet Take 10 mg by mouth daily.      . Cholecalciferol (VITAMIN D) 2000 UNITS CAPS Take 2 capsules by mouth every morning. Reported on 06/05/2015    . clotrimazole (MYCELEX) 10 MG troche Take 1 tablet (10 mg total) by mouth 5 (five) times daily. 35 tablet 2  . cyanocobalamin (,VITAMIN B-12,) 1000 MCG/ML injection Inject into the muscle every 30 (thirty) days.    Marland Kitchen dalfampridine (AMPYRA) 10 MG TB12 Take 1 tablet (10 mg total) by mouth 2 (two) times daily. 60 tablet 1  . furosemide (LASIX) 40 MG tablet Take 1 tablet (40 mg total) by mouth daily. 90 tablet 0  . gabapentin (NEURONTIN) 300 MG capsule 2 capsules in AM, 1 capsule in afternoon, 2 capsules in PM 450 capsule 3  . glucose blood (FREESTYLE TEST STRIPS) test strip 1 each by Other route 2 (two) times daily. And lancets, 2 times daily. 180 each 3  . glucose monitoring kit (FREESTYLE) monitoring kit 1 each by Does not apply route as needed for other. Dispense any model that is covered- dispense testing supplies for Q AC/ HS accuchecks- 1 month supply with one refil. 1 each 1  . insulin lispro (HUMALOG KWIKPEN) 100 UNIT/ML KwikPen Inject 0.22 mLs (22 Units total) into the skin 2 (two) times daily. And pen needles 3/day 15 pen 3  . Lancets (FREESTYLE) lancets Use to check blood sugar two time daily. 150 each 2  . loperamide (IMODIUM) 2 MG capsule Take 2 mg by mouth as needed for diarrhea or loose stools.    . medroxyPROGESTERone (DEPO-PROVERA) 150 MG/ML injection Inject 150 mg into the muscle every 3 (three) months.      . metFORMIN (GLUCOPHAGE-XR) 500 MG 24 hr tablet TAKE 3 TABLETS (1500 MG ) DAILY WITH BREAKFAST ( OFFICE VISIT DUE ) 90 tablet 11  . metoprolol tartrate (LOPRESSOR) 100 MG tablet TAKE 2 TABLETS DAILY 180 tablet 1  . nortriptyline  (PAMELOR) 50 MG capsule Take 2 capsules (100 mg total) by mouth at bedtime. At bedtime 180 capsule 3  . omeprazole-sodium bicarbonate (ZEGERID) 40-1100 MG per capsule Take 1 capsule by mouth daily.      . potassium chloride (KLOR-CON) 10 MEQ tablet TAKE 2 TABLETS DAILY 180 tablet 0  . sitaGLIPtin (JANUVIA) 100 MG tablet Take 1 tablet (100 mg total) by mouth daily. DUE FOR AN APPT 90 tablet 0  . traMADol (ULTRAM)  50 MG tablet TAKE 1 TABLET BY MOUTH EVERY 6 HOURS AS NEEDED 20 tablet 5  . Vitamin D, Ergocalciferol, (DRISDOL) 1.25 MG (50000 UT) CAPS capsule Take 1 capsule (50,000 Units total) by mouth every 7 (seven) days. 12 capsule 0   No current facility-administered medications on file prior to visit.    Allergies  Allergen Reactions  . Ciprofloxacin In D5w Rash  . Penicillins     REACTION: rash  . Tricor [Fenofibrate] Rash    Family History  Problem Relation Age of Onset  . Migraines Mother   . Hypertension Father   . COPD Father   . Diabetes Father   . Prostate cancer Maternal Uncle        prostate cancer  . Lung cancer Maternal Grandmother   . Stroke Maternal Grandfather   . Heart attack Maternal Grandfather   . Diabetes Other   . Hypertension Other   . Asthma Daughter   . Colon cancer Neg Hx   . Esophageal cancer Neg Hx   . Rectal cancer Neg Hx   . Stomach cancer Neg Hx     BP 140/88 (BP Location: Right Arm, Patient Position: Sitting, Cuff Size: Large)   Pulse 94   Ht _0  (1.549 m)   Wt 208 lb 3.2 oz (94.4 kg)   SpO2 98%   BMI 39.34 kg/m    Review of Systems She denies hypoglycemia.  Nausea is less recently    Objective:   Physical Exam VITAL SIGNS:  See vs page GENERAL: no distress Pulses: dorsalis pedis intact bilat.   MSK: no deformity of the feet CV: trace bilat leg edema Skin:  no ulcer on the feet.  normal color and temp on the feet. Neuro: sensation is intact to touch on the feet   Lab Results  Component Value Date   HGBA1C 7.7 (A)  05/22/2019        Assessment & Plan:  Hypokalemia: recheck today HTN: is noted today Insulin-requiring type 2 DM: she needs increased rx Edema: This limits rx options  Patient Instructions  Your blood pressure is high today.  Please see your primary care provider soon, to have it rechecked check your blood sugar twice a day.  vary the time of day when you check, between before the 3 meals, and at bedtime.  also check if you have symptoms of your blood sugar being too high or too low.  please keep a record of the readings and bring it to your next appointment here.  You can write it on any piece of paper.   Please increase the insulin to 25 units twice a day (just before each meal), and: I have sent a prescription to your pharmacy, to add "Invokana," and: Blood tests are requested for you today.  We'll let you know about the results.  Please come back for a follow-up appointment in 2 months.

## 2019-05-22 NOTE — Patient Instructions (Addendum)
Your blood pressure is high today.  Please see your primary care provider soon, to have it rechecked check your blood sugar twice a day.  vary the time of day when you check, between before the 3 meals, and at bedtime.  also check if you have symptoms of your blood sugar being too high or too low.  please keep a record of the readings and bring it to your next appointment here.  You can write it on any piece of paper.   Please increase the insulin to 25 units twice a day (just before each meal), and: I have sent a prescription to your pharmacy, to add "Invokana," and: Blood tests are requested for you today.  We'll let you know about the results.  Please come back for a follow-up appointment in 2 months.

## 2019-05-23 ENCOUNTER — Ambulatory Visit (INDEPENDENT_AMBULATORY_CARE_PROVIDER_SITE_OTHER): Payer: Medicare Other | Admitting: *Deleted

## 2019-05-23 DIAGNOSIS — Z309 Encounter for contraceptive management, unspecified: Secondary | ICD-10-CM

## 2019-05-23 DIAGNOSIS — E538 Deficiency of other specified B group vitamins: Secondary | ICD-10-CM

## 2019-05-23 MED ORDER — MEDROXYPROGESTERONE ACETATE 150 MG/ML IM SUSP
150.0000 mg | Freq: Once | INTRAMUSCULAR | Status: AC
  Administered 2019-05-23: 150 mg via INTRAMUSCULAR

## 2019-05-23 MED ORDER — CYANOCOBALAMIN 1000 MCG/ML IJ SOLN
1000.0000 ug | Freq: Once | INTRAMUSCULAR | Status: AC
  Administered 2019-05-23: 1000 ug via INTRAMUSCULAR

## 2019-05-23 NOTE — Progress Notes (Signed)
Medical screening examination/treatment/procedure(s) were performed by non-physician practitioner and as supervising physician I was immediately available for consultation/collaboration. I agree with above. Wiatt Mahabir, MD   

## 2019-06-03 ENCOUNTER — Other Ambulatory Visit: Payer: Self-pay

## 2019-06-03 NOTE — Telephone Encounter (Signed)
Rx(s) sent to pharmacy electronically.  

## 2019-06-07 ENCOUNTER — Other Ambulatory Visit: Payer: Self-pay | Admitting: Neurology

## 2019-06-11 ENCOUNTER — Other Ambulatory Visit: Payer: Self-pay | Admitting: Neurology

## 2019-06-12 ENCOUNTER — Ambulatory Visit (INDEPENDENT_AMBULATORY_CARE_PROVIDER_SITE_OTHER): Payer: Medicare Other | Admitting: Internal Medicine

## 2019-06-12 ENCOUNTER — Encounter: Payer: Self-pay | Admitting: Internal Medicine

## 2019-06-12 ENCOUNTER — Other Ambulatory Visit: Payer: Self-pay

## 2019-06-12 VITALS — BP 122/74 | HR 88 | Temp 98.8°F | Ht 61.0 in | Wt 202.4 lb

## 2019-06-12 DIAGNOSIS — E559 Vitamin D deficiency, unspecified: Secondary | ICD-10-CM | POA: Diagnosis not present

## 2019-06-12 DIAGNOSIS — E785 Hyperlipidemia, unspecified: Secondary | ICD-10-CM

## 2019-06-12 DIAGNOSIS — Z114 Encounter for screening for human immunodeficiency virus [HIV]: Secondary | ICD-10-CM | POA: Diagnosis not present

## 2019-06-12 DIAGNOSIS — Z794 Long term (current) use of insulin: Secondary | ICD-10-CM

## 2019-06-12 DIAGNOSIS — Z0000 Encounter for general adult medical examination without abnormal findings: Secondary | ICD-10-CM | POA: Diagnosis not present

## 2019-06-12 DIAGNOSIS — I10 Essential (primary) hypertension: Secondary | ICD-10-CM | POA: Diagnosis not present

## 2019-06-12 DIAGNOSIS — E538 Deficiency of other specified B group vitamins: Secondary | ICD-10-CM

## 2019-06-12 DIAGNOSIS — E119 Type 2 diabetes mellitus without complications: Secondary | ICD-10-CM | POA: Diagnosis not present

## 2019-06-12 LAB — CBC WITH DIFFERENTIAL/PLATELET
Basophils Absolute: 0.1 10*3/uL (ref 0.0–0.1)
Basophils Relative: 0.9 % (ref 0.0–3.0)
Eosinophils Absolute: 0.4 10*3/uL (ref 0.0–0.7)
Eosinophils Relative: 5.1 % — ABNORMAL HIGH (ref 0.0–5.0)
HCT: 37.4 % (ref 36.0–46.0)
Hemoglobin: 12.3 g/dL (ref 12.0–15.0)
Lymphocytes Relative: 22.9 % (ref 12.0–46.0)
Lymphs Abs: 1.9 10*3/uL (ref 0.7–4.0)
MCHC: 32.8 g/dL (ref 30.0–36.0)
MCV: 80.2 fl (ref 78.0–100.0)
Monocytes Absolute: 0.6 10*3/uL (ref 0.1–1.0)
Monocytes Relative: 6.9 % (ref 3.0–12.0)
Neutro Abs: 5.2 10*3/uL (ref 1.4–7.7)
Neutrophils Relative %: 64.2 % (ref 43.0–77.0)
Platelets: 279 10*3/uL (ref 150.0–400.0)
RBC: 4.67 Mil/uL (ref 3.87–5.11)
RDW: 17.2 % — ABNORMAL HIGH (ref 11.5–15.5)
WBC: 8.1 10*3/uL (ref 4.0–10.5)

## 2019-06-12 LAB — HEPATIC FUNCTION PANEL
ALT: 19 U/L (ref 0–35)
AST: 15 U/L (ref 0–37)
Albumin: 4.2 g/dL (ref 3.5–5.2)
Alkaline Phosphatase: 68 U/L (ref 39–117)
Bilirubin, Direct: 0 mg/dL (ref 0.0–0.3)
Total Bilirubin: 0.4 mg/dL (ref 0.2–1.2)
Total Protein: 7.2 g/dL (ref 6.0–8.3)

## 2019-06-12 LAB — MICROALBUMIN / CREATININE URINE RATIO
Creatinine,U: 29.2 mg/dL
Microalb Creat Ratio: 3 mg/g (ref 0.0–30.0)
Microalb, Ur: 0.9 mg/dL (ref 0.0–1.9)

## 2019-06-12 LAB — VITAMIN B12: Vitamin B-12: 291 pg/mL (ref 211–911)

## 2019-06-12 LAB — VITAMIN D 25 HYDROXY (VIT D DEFICIENCY, FRACTURES): VITD: 52.34 ng/mL (ref 30.00–100.00)

## 2019-06-12 NOTE — Addendum Note (Signed)
Addended by: Isaiah Serge D on: 06/12/2019 02:04 PM   Modules accepted: Orders

## 2019-06-12 NOTE — Patient Instructions (Signed)
Please continue all other medications as before, and refills have been done if requested.  Please have the pharmacy call with any other refills you may need.  Please continue your efforts at being more active, low cholesterol diet, and weight control.  You are otherwise up to date with prevention measures today.  Please keep your appointments with your specialists as you may have planned  You will be contacted regarding the referral for: OBGYN  Please remember to call for your yearly eye doctor appt  Please go to the LAB at the blood drawing area for the tests to be done  You will be contacted by phone if any changes need to be made immediately.  Otherwise, you will receive a letter about your results with an explanation, but please check with MyChart first.  Please remember to sign up for MyChart if you have not done so, as this will be important to you in the future with finding out test results, communicating by private email, and scheduling acute appointments online when needed.  Please make an Appointment to return in 6 months, or sooner if needed

## 2019-06-12 NOTE — Assessment & Plan Note (Signed)
stable overall by history and exam, recent data reviewed with pt, and pt to continue medical treatment as before,  to f/u any worsening symptoms or concerns  I spent 31 minutes  in preparing to see the patient by review of recent labs, imaging and procedures, obtaining and reviewing separately obtained history, communicating with the patient and family or caregiver, ordering medications, tests or procedures, and documenting clinical information in the EHR including the differential Dx, treatment, and any further evaluation and other management of HLD, HTN, DM and wellness

## 2019-06-12 NOTE — Progress Notes (Signed)
Subjective:    Patient ID: Courtney [name], female    DOB: March 18, 1976, 44 y.o.   MRN: [id number]  HPI   Here to f/u; overall doing ok,  Pt denies chest pain, increasing sob or doe, wheezing, orthopnea, PND, increased LE swelling, palpitations, dizziness or syncope.  Pt denies new neurological symptoms such as new headache, or facial or extremity weakness or numbness.  Pt denies polydipsia, polyuria, or low sugar episode.  Pt states overall good compliance with meds, mostly trying to follow appropriate diet, with wt overall stable,  but little exercise however, worse recently as mother was ill and pt was at beside for many weeks. Plans to call for eye doctor appt.  No new complaints. Needs to be on cone vaccine wt list.  Conts to follow with endo for DM.   Past Medical History:  Diagnosis Date  . ALLERGIC RHINITIS 03/13/2007  . Allergy   . Anemia, B12 deficiency   . Anxiety   . C. difficile colitis   . CARPAL TUNNEL SYNDROME, BILATERAL 01/13/2010  . Cervical lesion 07/13/2011   Spine lesion - indeterminate, for f/u MRI may 2013 per neurology  . Chronic fatigue   . CTS (carpal tunnel syndrome)   . Diabetes mellitus without complication ([place])   . ESSENTIAL HYPERTENSION 03/13/2007  . Fibromyalgia   . GERD (gastroesophageal reflux disease)   . Heart murmur   . Hx of adenomatous polyp of colon 06/09/2009  . Hyperlipidemia   . HYPERLIPIDEMIA 03/13/2007  . Irritable bowel syndrome 05/24/2010  . Multiple sclerosis ([place])   . OBSTRUCTIVE SLEEP APNEA 12/08/2008  . Sleep apnea    uses c pap  . VITAMIN D DEFICIENCY 01/13/2010   Past Surgical History:  Procedure Laterality Date  . APPENDECTOMY  07/23/12  . CARPAL TUNNEL RELEASE  01/2010   bilateral  . COLONOSCOPY W/ BIOPSIES AND POLYPECTOMY  05/2009   2 tubular adenomas  . LAPAROSCOPIC APPENDECTOMY N/A 07/23/2012   Procedure: APPENDECTOMY LAPAROSCOPIC;  Surgeon: Gayland Curry, MD;  Location: [facility];  Service: General;  Laterality: N/A;    reports  that she has never smoked. She has never used smokeless tobacco. She reports that she does not drink alcohol or use drugs. family history includes Asthma in her daughter; COPD in her father; Diabetes in her father and another family member; Heart attack in her maternal grandfather; Hypertension in her father and another family member; Lung cancer in her maternal grandmother; Migraines in her mother; Prostate cancer in her maternal uncle; Stroke in her maternal grandfather. Allergies  Allergen Reactions  . Ciprofloxacin In D5w Rash  . Penicillins     REACTION: rash  . Tricor [Fenofibrate] Rash   Current Outpatient Medications on File Prior to Visit  Medication Sig Dispense Refill  . atorvastatin (LIPITOR) 20 MG tablet TAKE 1 TABLET DAILY 90 tablet 1  . AUBAGIO 14 MG TABS TAKE 1 TABLET DAILY 90 tablet 3  . B-D UF III MINI PEN NEEDLES 31G X 5 MM MISC USE TO INJECT HUMALOG KWIKPEN INTO THE SKIN THREE TIMES A DAY 90 each 0  . baclofen (LIORESAL) 10 MG tablet Take 1 tablet (10 mg total) by mouth 3 (three) times daily. 270 each 2  . canagliflozin (INVOKANA) 100 MG TABS tablet Take 1 tablet (100 mg total) by mouth daily before breakfast. 90 tablet 3  . cetirizine (ZYRTEC) 10 MG tablet Take 10 mg by mouth daily.      . Cholecalciferol (VITAMIN D) 2000 UNITS CAPS  Take 2 capsules by mouth every morning. Reported on 06/05/2015    . cyanocobalamin (,VITAMIN B-12,) 1000 MCG/ML injection Inject into the muscle every 30 (thirty) days.    . furosemide (LASIX) 40 MG tablet Take 1 tablet (40 mg total) by mouth daily. 90 tablet 0  . gabapentin (NEURONTIN) 300 MG capsule 2 capsules in AM, 1 capsule in afternoon, 2 capsules in PM 450 capsule 3  . glucose blood (FREESTYLE TEST STRIPS) test strip 1 each by Other route 2 (two) times daily. And lancets, 2 times daily. 180 each 3  . glucose monitoring kit (FREESTYLE) monitoring kit 1 each by Does not apply route as needed for other. Dispense any model that is covered-  dispense testing supplies for Q AC/ HS accuchecks- 1 month supply with one refil. 1 each 1  . insulin lispro (HUMALOG KWIKPEN) 100 UNIT/ML KwikPen Inject 0.22 mLs (22 Units total) into the skin 2 (two) times daily. And pen needles 3/day 15 pen 3  . Lancets (FREESTYLE) lancets Use to check blood sugar two time daily. 150 each 2  . metFORMIN (GLUCOPHAGE-XR) 500 MG 24 hr tablet TAKE 3 TABLETS (1500 MG ) DAILY WITH BREAKFAST ( OFFICE VISIT DUE ) 90 tablet 11  . metoprolol tartrate (LOPRESSOR) 100 MG tablet TAKE 2 TABLETS DAILY 180 tablet 1  . nortriptyline (PAMELOR) 50 MG capsule Take 2 capsules (100 mg total) by mouth at bedtime. At bedtime 180 capsule 3  . omeprazole-sodium bicarbonate (ZEGERID) 40-1100 MG per capsule Take 1 capsule by mouth daily.      . potassium chloride (KLOR-CON) 10 MEQ tablet TAKE 2 TABLETS DAILY 180 tablet 0  . sitaGLIPtin (JANUVIA) 100 MG tablet Take 1 tablet (100 mg total) by mouth daily. DUE FOR AN APPT 90 tablet 0  . Vitamin D, Ergocalciferol, (DRISDOL) 1.25 MG (50000 UT) CAPS capsule Take 1 capsule (50,000 Units total) by mouth every 7 (seven) days. 12 capsule 0  . clotrimazole (MYCELEX) 10 MG troche Take 1 tablet (10 mg total) by mouth 5 (five) times daily. 35 tablet 2  . dalfampridine (AMPYRA) 10 MG TB12 Take 1 tablet (10 mg total) by mouth 2 (two) times daily. 60 tablet 1  . loperamide (IMODIUM) 2 MG capsule Take 2 mg by mouth as needed for diarrhea or loose stools.    . medroxyPROGESTERone (DEPO-PROVERA) 150 MG/ML injection Inject 150 mg into the muscle every 3 (three) months.      . traMADol (ULTRAM) 50 MG tablet TAKE 1 TABLET BY MOUTH EVERY 6 HOURS AS NEEDED (Patient not taking: Reported on 06/12/2019) 20 tablet 5   No current facility-administered medications on file prior to visit.   Review of Systems All otherwise neg per pt     Objective:   Physical Exam BP 122/74 (BP Location: Left Arm, Patient Position: Sitting, Cuff Size: Large)   Pulse 88   Temp 98.8  F (37.1 C) (Oral)   Ht _0  (1.549 m)   Wt 202 lb 6.4 oz (91.8 kg)   LMP  (LMP Unknown)   SpO2 94%   BMI 38.24 kg/m  VS noted,  Constitutional: Pt appears in NAD HENT: Head: NCAT.  Right Ear: External ear normal.  Left Ear: External ear normal.  Eyes: . Pupils are equal, round, and reactive to light. Conjunctivae and EOM are normal Nose: without d/c or deformity Neck: Neck supple. Gross normal ROM Cardiovascular: Normal rate and regular rhythm.   Pulmonary/Chest: Effort normal and breath sounds without rales or wheezing.  Abd:  Soft, NT, ND, + BS, no organomegaly Neurological: Pt is alert. At baseline orientation, motor grossly intact Skin: Skin is warm. No rashes, other new lesions, no LE edema Psychiatric: Pt behavior is normal without agitation  'All otherwise neg per pt   Lab Results  Component Value Date   WBC 7.5 01/03/2018   HGB 10.7 (L) 01/03/2018   HCT 30.9 (L) 01/03/2018   PLT 263.0 01/03/2018   GLUCOSE 134 (H) 05/22/2019   CHOL 155 05/22/2019   TRIG (H) 05/22/2019    416.0 Triglyceride is over 400; calculations on Lipids are invalid.   HDL 39.80 05/22/2019   LDLDIRECT 70.0 05/22/2019   ALT 20 10/26/2018   AST 18 10/26/2018   NA 140 05/22/2019   K 3.6 05/22/2019   CL 102 05/22/2019   CREATININE 0.72 05/22/2019   BUN 11 05/22/2019   CO2 28 05/22/2019   TSH 1.63 05/22/2019   INR 1.0 12/23/2010   HGBA1C 7.7 (A) 05/22/2019   MICROALBUR 11.0 (H) 01/03/2018      Assessment & Plan:

## 2019-06-13 LAB — HIV ANTIBODY (ROUTINE TESTING W REFLEX): HIV 1&2 Ab, 4th Generation: NONREACTIVE

## 2019-06-17 ENCOUNTER — Other Ambulatory Visit: Payer: Self-pay | Admitting: Endocrinology

## 2019-06-17 DIAGNOSIS — E119 Type 2 diabetes mellitus without complications: Secondary | ICD-10-CM

## 2019-06-20 ENCOUNTER — Encounter: Payer: Self-pay | Admitting: Endocrinology

## 2019-06-21 ENCOUNTER — Ambulatory Visit (INDEPENDENT_AMBULATORY_CARE_PROVIDER_SITE_OTHER): Payer: Medicare Other | Admitting: *Deleted

## 2019-06-21 ENCOUNTER — Other Ambulatory Visit: Payer: Self-pay

## 2019-06-21 DIAGNOSIS — E538 Deficiency of other specified B group vitamins: Secondary | ICD-10-CM

## 2019-06-21 MED ORDER — CYANOCOBALAMIN 1000 MCG/ML IJ SOLN
1000.0000 ug | Freq: Once | INTRAMUSCULAR | Status: AC
  Administered 2019-06-21: 1000 ug via INTRAMUSCULAR

## 2019-06-21 NOTE — Progress Notes (Signed)
Pls cosign for B12 inj../lmb  

## 2019-06-30 ENCOUNTER — Other Ambulatory Visit: Payer: Self-pay | Admitting: Internal Medicine

## 2019-06-30 NOTE — Telephone Encounter (Signed)
Please refill as per office routine med refill policy (all routine meds refilled for 3 mo or monthly per pt preference up to one year from last visit, then month to month grace period for 3 mo, then further med refills will have to be denied)  

## 2019-07-03 ENCOUNTER — Other Ambulatory Visit: Payer: Self-pay | Admitting: Neurology

## 2019-07-03 MED ORDER — DALFAMPRIDINE ER 10 MG PO TB12: 10.0000 mg | ORAL_TABLET | Freq: Two times a day (BID) | ORAL | 3 refills | Status: DC

## 2019-07-03 NOTE — Progress Notes (Signed)
Ampyra refilled to [facility], 90 day supply

## 2019-07-12 ENCOUNTER — Ambulatory Visit: Payer: Medicare Other | Attending: Internal Medicine

## 2019-07-12 DIAGNOSIS — Z23 Encounter for immunization: Secondary | ICD-10-CM

## 2019-07-12 NOTE — Progress Notes (Signed)
   Covid-19 Vaccination Clinic  Name:  Courtney [name]    MRN: [id number] DOB: 1975/06/08  07/12/2019  Courtney [name] was observed post Covid-19 immunization for 15 minutes without incident. She was provided with Vaccine Information Sheet and instruction to access the V-Safe system.   Courtney [name] was instructed to call 911 with any severe reactions post vaccine: Marland Kitchen Difficulty breathing  . Swelling of face and throat  . A fast heartbeat  . A bad rash all over body  . Dizziness and weakness   Immunizations Administered    Name Date Dose VIS Date Route   Pfizer COVID-19 Vaccine 07/12/2019  9:59 AM 0.3 mL 03/29/2019 Intramuscular   Manufacturer: [place]   Lot: [id number]   [place]: [id number]

## 2019-07-17 ENCOUNTER — Encounter: Payer: Self-pay | Admitting: Internal Medicine

## 2019-07-22 ENCOUNTER — Other Ambulatory Visit: Payer: Self-pay

## 2019-07-22 ENCOUNTER — Ambulatory Visit (INDEPENDENT_AMBULATORY_CARE_PROVIDER_SITE_OTHER): Payer: Medicare Other

## 2019-07-22 DIAGNOSIS — E538 Deficiency of other specified B group vitamins: Secondary | ICD-10-CM | POA: Diagnosis not present

## 2019-07-22 MED ORDER — CYANOCOBALAMIN 1000 MCG/ML IJ SOLN
1000.0000 ug | Freq: Once | INTRAMUSCULAR | Status: AC
  Administered 2019-07-22: 1000 ug via INTRAMUSCULAR

## 2019-07-22 NOTE — Progress Notes (Signed)
Medical screening examination/treatment/procedure(s) were performed by non-physician practitioner and as supervising physician I was immediately available for consultation/collaboration. I agree with above. Janielle Mittelstadt, MD   

## 2019-07-24 ENCOUNTER — Encounter: Payer: Self-pay | Admitting: *Deleted

## 2019-07-24 NOTE — Progress Notes (Signed)
Received a letter on 07/23/19 dated 07/18/2019 stating Juliann Pulse filled a prescription (Aubagio 14mg  tablets) under the [place] prescription plan and she was given a temporary supply. It is not covered on their formulary.  In order to receive coverage the letter (scanned into her chart) gave options. One of the options was for the patient to call to request coverage review. I called Annajean and she has received the letter as well . I asked her to call and if she is unable to clear this up we will start a formal appeal. I told her time is of the essence so she will need to call soon because sometimes these things take time. She stated she understood and will do her part and let us know if we need to do anything.

## 2019-07-26 ENCOUNTER — Ambulatory Visit (INDEPENDENT_AMBULATORY_CARE_PROVIDER_SITE_OTHER): Payer: Medicare Other | Admitting: Endocrinology

## 2019-07-26 ENCOUNTER — Other Ambulatory Visit: Payer: Self-pay

## 2019-07-26 ENCOUNTER — Encounter: Payer: Self-pay | Admitting: Endocrinology

## 2019-07-26 VITALS — BP 138/88 | HR 80 | Ht 61.0 in | Wt 209.0 lb

## 2019-07-26 DIAGNOSIS — E119 Type 2 diabetes mellitus without complications: Secondary | ICD-10-CM | POA: Diagnosis not present

## 2019-07-26 DIAGNOSIS — Z794 Long term (current) use of insulin: Secondary | ICD-10-CM | POA: Diagnosis not present

## 2019-07-26 LAB — POCT GLYCOSYLATED HEMOGLOBIN (HGB A1C): Hemoglobin A1C: 8 % — AB (ref 4.0–5.6)

## 2019-07-26 MED ORDER — INSULIN LISPRO (1 UNIT DIAL) 100 UNIT/ML (KWIKPEN): PEN_INJECTOR | SUBCUTANEOUS | 3 refills | Status: DC

## 2019-07-26 NOTE — Patient Instructions (Addendum)
Please increase the insulin to 22 units with breakfast, and 28 units with the evening meal. check your blood sugar twice a day.  vary the time of day when you check, between before the 3 meals, and at bedtime.  also check if you have symptoms of your blood sugar being too high or too low.  please keep a record of the readings and bring it to your next appointment here (or you can bring the meter itself).  You can write it on any piece of paper.  please call us sooner if your blood sugar goes below 70, or if you have a lot of readings over 200. Our next goal is to see if the blood sugar goes up after supper, or overnight. Please come back for a follow-up appointment in 2 months.

## 2019-07-26 NOTE — Progress Notes (Signed)
Subjective:    Patient ID: Courtney [name], female    DOB: May 19, 1975, 44 y.o.   MRN: [id number]  HPI Pt returns for f/u of diabetes mellitus: DM type: Insulin-requiring type 2 Dx'ed: 2016, when she presented with nonketotic hyperosmolar hyperglycemic state, but not DKA.   Complications: none Therapy: insulin since 2018, and 2 oral meds.  GDM: never. DKA: never. Severe hypoglycemia: never.  Pancreatitis: never.   Other: she takes multiple daily injections; metformin dosage is limited by nausea (which also precludes GLP rx).  She declines weight loss surgery; she ddi not tolerate Invokana (vaginitis).   Interval history: Pt says she does not miss the insulin, but she usually eats just 2 meals per day.   no cbg record, but states cbg's vary from 70-200.  It is in general higher fasting than in the afternoon.  She does not check at HS.   Past Medical History:  Diagnosis Date  . ALLERGIC RHINITIS 03/13/2007  . Allergy   . Anemia, B12 deficiency   . Anxiety   . C. difficile colitis   . CARPAL TUNNEL SYNDROME, BILATERAL 01/13/2010  . Cervical lesion 07/13/2011   Spine lesion - indeterminate, for f/u MRI may 2013 per neurology  . Chronic fatigue   . CTS (carpal tunnel syndrome)   . Diabetes mellitus without complication ([place])   . ESSENTIAL HYPERTENSION 03/13/2007  . Fibromyalgia   . GERD (gastroesophageal reflux disease)   . Heart murmur   . Hx of adenomatous polyp of colon 06/09/2009  . Hyperlipidemia   . HYPERLIPIDEMIA 03/13/2007  . Irritable bowel syndrome 05/24/2010  . Multiple sclerosis ([place])   . OBSTRUCTIVE SLEEP APNEA 12/08/2008  . Sleep apnea    uses c pap  . VITAMIN D DEFICIENCY 01/13/2010    Past Surgical History:  Procedure Laterality Date  . APPENDECTOMY  07/23/12  . CARPAL TUNNEL RELEASE  01/2010   bilateral  . COLONOSCOPY W/ BIOPSIES AND POLYPECTOMY  05/2009   2 tubular adenomas  . LAPAROSCOPIC APPENDECTOMY N/A 07/23/2012   Procedure: APPENDECTOMY LAPAROSCOPIC;   Surgeon: Gayland Curry, MD;  Location: [facility] OR;  Service: General;  Laterality: N/A;    Social History   Socioeconomic History  . Marital status: Single    Spouse name: Not on file  . Number of children: 2  . Years of education: Not on file  . Highest education level: Not on file  Occupational History  . 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 Smoking status: Never Smoker  . Smokeless tobacco: Never Used  Substance and Sexual Activity  . Alcohol use: No    Alcohol/week: 0.0 standard drinks  . Drug use: No  . Sexual activity: Not on file  Other Topics Concern  . Not on file  Social History Narrative   Daily Caffeine   Social Determinants of Health   Financial Resource Strain:   . Difficulty of Paying Living Expenses:   Food Insecurity:   . Worried About Charity fundraiser in the Last Year:   . Arboriculturist in the Last Year:   Transportation Needs:   . Film/video editor (Medical):   Marland Kitchen Lack of Transportation (Non-Medical):   Physical Activity:   . Days of Exercise per Week:   . Minutes of Exercise per Session:   Stress:   . Feeling of Stress :   Social Connections:   . Frequency of Communication with Friends and Family:   .  Frequency of Social Gatherings with Friends and Family:   . Attends Religious Services:   . Active Member of Clubs or Organizations:   . Attends Archivist Meetings:   Marland Kitchen Marital Status:   Intimate Partner Violence:   . Fear of Current or Ex-Partner:   . Emotionally Abused:   Marland Kitchen Physically Abused:   . Sexually Abused:     Current Outpatient Medications on File Prior to Visit  Medication Sig Dispense Refill  . atorvastatin (LIPITOR) 20 MG tablet TAKE 1 TABLET DAILY 90 tablet 1  . AUBAGIO 14 MG TABS TAKE 1 TABLET DAILY 90 tablet 3  . B-D UF III MINI PEN NEEDLES 31G X 5 MM MISC USE TO INJECT HUMALOG KWIKPEN INTO THE SKIN THREE TIMES A DAY 90 each 0  . baclofen (LIORESAL) 10 MG tablet Take 1  tablet (10 mg total) by mouth 3 (three) times daily. 270 each 2  . cetirizine (ZYRTEC) 10 MG tablet Take 10 mg by mouth daily.      . Cholecalciferol (VITAMIN D) 2000 UNITS CAPS Take 2 capsules by mouth every morning. Reported on 06/05/2015    . clotrimazole (MYCELEX) 10 MG troche Take 1 tablet (10 mg total) by mouth 5 (five) times daily. 35 tablet 2  . cyanocobalamin (,VITAMIN B-12,) 1000 MCG/ML injection Inject into the muscle every 30 (thirty) days.    Marland Kitchen dalfampridine (AMPYRA) 10 MG TB12 Take 1 tablet (10 mg total) by mouth 2 (two) times daily. 180 tablet 3  . furosemide (LASIX) 40 MG tablet Take 1 tablet (40 mg total) by mouth daily. 90 tablet 0  . gabapentin (NEURONTIN) 300 MG capsule 2 capsules in AM, 1 capsule in afternoon, 2 capsules in PM 450 capsule 3  . glucose blood (FREESTYLE TEST STRIPS) test strip 1 each by Other route 2 (two) times daily. And lancets, 2 times daily. 180 each 3  . glucose monitoring kit (FREESTYLE) monitoring kit 1 each by Does not apply route as needed for other. Dispense any model that is covered- dispense testing supplies for Q AC/ HS accuchecks- 1 month supply with one refil. 1 each 1  . Lancets (FREESTYLE) lancets Use to check blood sugar two time daily. 150 each 2  . loperamide (IMODIUM) 2 MG capsule Take 2 mg by mouth as needed for diarrhea or loose stools.    . medroxyPROGESTERone (DEPO-PROVERA) 150 MG/ML injection Inject 150 mg into the muscle every 3 (three) months.      . metFORMIN (GLUCOPHAGE-XR) 500 MG 24 hr tablet TAKE 3 TABLETS (1500 MG ) DAILY WITH BREAKFAST ( OFFICE VISIT DUE ) 90 tablet 11  . metoprolol tartrate (LOPRESSOR) 100 MG tablet TAKE 2 TABLETS DAILY 180 tablet 3  . nortriptyline (PAMELOR) 50 MG capsule Take 2 capsules (100 mg total) by mouth at bedtime. At bedtime 180 capsule 3  . omeprazole-sodium bicarbonate (ZEGERID) 40-1100 MG per capsule Take 1 capsule by mouth daily.      . potassium chloride (KLOR-CON) 10 MEQ tablet TAKE 2 TABLETS  DAILY 180 tablet 3  . sitaGLIPtin (JANUVIA) 100 MG tablet Take 1 tablet (100 mg total) by mouth daily. 90 tablet 0  . traMADol (ULTRAM) 50 MG tablet TAKE 1 TABLET BY MOUTH EVERY 6 HOURS AS NEEDED 20 tablet 5  . Vitamin D, Ergocalciferol, (DRISDOL) 1.25 MG (50000 UT) CAPS capsule Take 1 capsule (50,000 Units total) by mouth every 7 (seven) days. 12 capsule 0   No current facility-administered medications on file prior to visit.  Allergies  Allergen Reactions  . Ciprofloxacin In D5w Rash  . Penicillins     REACTION: rash  . Tricor [Fenofibrate] Rash    Family History  Problem Relation Age of Onset  . Migraines Mother   . Hypertension Father   . COPD Father   . Diabetes Father   . Prostate cancer Maternal Uncle        prostate cancer  . Lung cancer Maternal Grandmother   . Stroke Maternal Grandfather   . Heart attack Maternal Grandfather   . Diabetes Other   . Hypertension Other   . Asthma Daughter   . Colon cancer Neg Hx   . Esophageal cancer Neg Hx   . Rectal cancer Neg Hx   . Stomach cancer Neg Hx     BP 138/88   Pulse 80   Ht '5\' 1"'  (1.549 m)   Wt 209 lb (94.8 kg)   SpO2 97%   BMI 39.49 kg/m    Review of Systems Denies LOC    Objective:   Physical Exam VITAL SIGNS:  See vs page GENERAL: no distress Pulses: dorsalis pedis intact bilat.   MSK: no deformity of the feet CV: no leg edema Skin:  no ulcer on the feet.  normal color and temp on the feet. Neuro: sensation is intact to touch on the feet  Lab Results  Component Value Date   HGBA1C 8.0 (A) 07/26/2019       Assessment & Plan:  Insulin-requiring type 2 DM: worse Hypoglycemia: this limits aggressiveness of glycemic control  Patient Instructions  Please increase the insulin to 22 units with breakfast, and 28 units with the evening meal. check your blood sugar twice a day.  vary the time of day when you check, between before the 3 meals, and at bedtime.  also check if you have symptoms of your  blood sugar being too high or too low.  please keep a record of the readings and bring it to your next appointment here (or you can bring the meter itself).  You can write it on any piece of paper.  please call us sooner if your blood sugar goes below 70, or if you have a lot of readings over 200. Our next goal is to see if the blood sugar goes up after supper, or overnight. Please come back for a follow-up appointment in 2 months.

## 2019-08-05 ENCOUNTER — Ambulatory Visit: Payer: Medicare Other | Attending: Internal Medicine

## 2019-08-05 DIAGNOSIS — Z23 Encounter for immunization: Secondary | ICD-10-CM

## 2019-08-05 NOTE — Progress Notes (Signed)
   Covid-19 Vaccination Clinic  Name:  Courtney [name]    MRN: [id number] DOB: Oct 22, 1975  08/05/2019  Courtney [name] was observed post Covid-19 immunization for 15 minutes without incident. She was provided with Vaccine Information Sheet and instruction to access the V-Safe system.   Courtney [name] was instructed to call 911 with any severe reactions post vaccine: Marland Kitchen Difficulty breathing  . Swelling of face and throat  . A fast heartbeat  . A bad rash all over body  . Dizziness and weakness   Immunizations Administered    Name Date Dose VIS Date Route   Pfizer COVID-19 Vaccine 08/05/2019  2:50 PM 0.3 mL 06/12/2018 Intramuscular   Manufacturer: [place]   Lot: [id number]   [place]: [id number]

## 2019-08-08 ENCOUNTER — Telehealth: Payer: Self-pay | Admitting: *Deleted

## 2019-08-08 NOTE — Telephone Encounter (Signed)
On 07/24/2019 I called Courtney [name] to let her know we received a letter form [facility] stating they will only fill Aubagio prescription temporarily because it is not covered on their formulary. She said she will call and clarify. She will let us know if we need to do anything on our side.  Follow up phone call today 08/08/2019 [facility] to let us know she is able to continue with her medication. I will send the letter to scan into her chart once we hear back from her. I gave her my direct line [phone]

## 2019-08-09 NOTE — Progress Notes (Signed)
Received approval from [facility] valid 08/08/2019-08/07/2020 Letter sent to scan into her chart

## 2019-08-21 ENCOUNTER — Other Ambulatory Visit: Payer: Self-pay

## 2019-08-21 ENCOUNTER — Ambulatory Visit (INDEPENDENT_AMBULATORY_CARE_PROVIDER_SITE_OTHER): Payer: Medicare Other | Admitting: *Deleted

## 2019-08-21 DIAGNOSIS — Z309 Encounter for contraceptive management, unspecified: Secondary | ICD-10-CM | POA: Diagnosis not present

## 2019-08-21 MED ORDER — MEDROXYPROGESTERONE ACETATE 150 MG/ML IM SUSP
150.0000 mg | Freq: Once | INTRAMUSCULAR | Status: AC
  Administered 2019-08-21: 10:00:00 150 mg via INTRAMUSCULAR

## 2019-08-21 NOTE — Progress Notes (Signed)
Pls cosign for Depo provera.Marland KitchenJohny Chess

## 2019-08-22 ENCOUNTER — Ambulatory Visit (INDEPENDENT_AMBULATORY_CARE_PROVIDER_SITE_OTHER): Payer: Medicare Other | Admitting: *Deleted

## 2019-08-22 DIAGNOSIS — E538 Deficiency of other specified B group vitamins: Secondary | ICD-10-CM | POA: Diagnosis not present

## 2019-08-22 MED ORDER — CYANOCOBALAMIN 1000 MCG/ML IJ SOLN
1000.0000 ug | Freq: Once | INTRAMUSCULAR | Status: AC
  Administered 2019-08-22: 1000 ug via INTRAMUSCULAR

## 2019-08-22 NOTE — Progress Notes (Signed)
Pls cosign for B12 inj../lmb  

## 2019-08-25 ENCOUNTER — Other Ambulatory Visit: Payer: Self-pay | Admitting: Endocrinology

## 2019-08-25 ENCOUNTER — Other Ambulatory Visit: Payer: Self-pay | Admitting: Internal Medicine

## 2019-08-25 ENCOUNTER — Other Ambulatory Visit: Payer: Self-pay | Admitting: Neurology

## 2019-08-25 DIAGNOSIS — E119 Type 2 diabetes mellitus without complications: Secondary | ICD-10-CM

## 2019-08-25 DIAGNOSIS — Z794 Long term (current) use of insulin: Secondary | ICD-10-CM

## 2019-08-25 NOTE — Telephone Encounter (Signed)
Please refill as per office routine med refill policy (all routine meds refilled for 3 mo or monthly per pt preference up to one year from last visit, then month to month grace period for 3 mo, then further med refills will have to be denied)  

## 2019-08-26 MED ORDER — CYANOCOBALAMIN 1000 MCG/ML IJ SOLN
1000.0000 ug | Freq: Once | INTRAMUSCULAR | Status: AC
  Administered 2019-08-22: 1000 ug via INTRAMUSCULAR

## 2019-08-26 MED ORDER — CYANOCOBALAMIN 1000 MCG/ML IJ SOLN: 1000.0000 ug | Freq: Once | INTRAMUSCULAR | Status: DC

## 2019-08-26 NOTE — Addendum Note (Signed)
Addended by: Earnstine Regal on: 08/26/2019 01:13 PM   Modules accepted: Orders

## 2019-08-26 NOTE — Addendum Note (Signed)
Addended by: Earnstine Regal on: 08/26/2019 02:33 PM   Modules accepted: Orders

## 2019-09-23 ENCOUNTER — Other Ambulatory Visit: Payer: Self-pay

## 2019-09-23 ENCOUNTER — Ambulatory Visit (INDEPENDENT_AMBULATORY_CARE_PROVIDER_SITE_OTHER): Payer: Medicare Other | Admitting: *Deleted

## 2019-09-23 DIAGNOSIS — E538 Deficiency of other specified B group vitamins: Secondary | ICD-10-CM

## 2019-09-23 MED ORDER — CYANOCOBALAMIN 1000 MCG/ML IJ SOLN
1000.0000 ug | Freq: Once | INTRAMUSCULAR | Status: AC
  Administered 2019-09-23: 1000 ug via INTRAMUSCULAR

## 2019-09-23 NOTE — Progress Notes (Signed)
Pls cosign for B12 inj../lmb  

## 2019-10-11 ENCOUNTER — Ambulatory Visit: Payer: Medicare Other | Admitting: Endocrinology

## 2019-10-16 ENCOUNTER — Encounter: Payer: Self-pay | Admitting: Endocrinology

## 2019-10-16 ENCOUNTER — Ambulatory Visit (INDEPENDENT_AMBULATORY_CARE_PROVIDER_SITE_OTHER): Payer: Medicare Other | Admitting: Endocrinology

## 2019-10-16 ENCOUNTER — Other Ambulatory Visit: Payer: Self-pay

## 2019-10-16 VITALS — BP 114/72 | HR 78 | Ht 61.0 in | Wt 205.0 lb

## 2019-10-16 DIAGNOSIS — G35 Multiple sclerosis: Secondary | ICD-10-CM

## 2019-10-16 DIAGNOSIS — E119 Type 2 diabetes mellitus without complications: Secondary | ICD-10-CM

## 2019-10-16 DIAGNOSIS — Z794 Long term (current) use of insulin: Secondary | ICD-10-CM

## 2019-10-16 LAB — POCT GLYCOSYLATED HEMOGLOBIN (HGB A1C): Hemoglobin A1C: 7.8 % — AB (ref 4.0–5.6)

## 2019-10-16 MED ORDER — FREESTYLE LIBRE 14 DAY SENSOR MISC: 1.0000 | 3 refills | Status: DC

## 2019-10-16 MED ORDER — RYBELSUS 3 MG PO TABS
3.0000 mg | ORAL_TABLET | Freq: Every day | ORAL | 3 refills | Status: DC
Start: 2019-10-16 — End: 2020-01-03

## 2019-10-16 MED ORDER — METFORMIN HCL ER 500 MG PO TB24: 1000.0000 mg | ORAL_TABLET | Freq: Every day | ORAL | 3 refills | Status: DC

## 2019-10-16 MED ORDER — FREESTYLE LIBRE 14 DAY READER DEVI: 1.0000 | Freq: Once | 0 refills | Status: AC

## 2019-10-16 NOTE — Patient Instructions (Addendum)
I have sent a prescription to your pharmacy, to change Januvia to Rybelsus, and: Reduce the metformin to 2 pills per day, and: Please continue the same insulin, and: I have sent a prescription to your pharmacy, for the continuous glucose monitor Please call if you need help getting started with this.   check your blood sugar twice a day.  vary the time of day when you check, between before the 3 meals, and at bedtime.  also check if you have symptoms of your blood sugar being too high or too low.  please keep a record of the readings and bring it to your next appointment here (or you can bring the meter itself).  You can write it on any piece of paper.  please call us sooner if your blood sugar goes below 70, or if you have a lot of readings over 200. Please come back for a follow-up appointment in 2 months.

## 2019-10-16 NOTE — Progress Notes (Signed)
Subjective:    Patient ID: Courtney [name], female    DOB: 1976-02-22, 44 y.o.   MRN: [id number]  HPI Pt returns for f/u of diabetes mellitus: DM type: Insulin-requiring type 2 Dx'ed: 2016, when she presented with nonketotic hyperosmolar hyperglycemic state, but not DKA.   Complications: none Therapy: insulin since 2018, and 2 oral meds.  GDM: never. DKA: never. Severe hypoglycemia: never.  Pancreatitis: never.   Other: she takes BID humalog (she usually eats just 2 meals per day); metformin dosage is limited by nausea (which also precludes GLP rx).  She declines weight loss surgery; she ddi not tolerate Invokana (vaginitis).   Interval history: Pt says she does not miss the insulin.  no cbg record, but states cbg's vary from 140-260.  It is in general higher fasting than at HS.   Past Medical History:  Diagnosis Date  . ALLERGIC RHINITIS 03/13/2007  . Allergy   . Anemia, B12 deficiency   . Anxiety   . C. difficile colitis   . CARPAL TUNNEL SYNDROME, BILATERAL 01/13/2010  . Cervical lesion 07/13/2011   Spine lesion - indeterminate, for f/u MRI may 2013 per neurology  . Chronic fatigue   . CTS (carpal tunnel syndrome)   . Diabetes mellitus without complication ([place])   . ESSENTIAL HYPERTENSION 03/13/2007  . Fibromyalgia   . GERD (gastroesophageal reflux disease)   . Heart murmur   . Hx of adenomatous polyp of colon 06/09/2009  . Hyperlipidemia   . HYPERLIPIDEMIA 03/13/2007  . Irritable bowel syndrome 05/24/2010  . Multiple sclerosis ([place])   . OBSTRUCTIVE SLEEP APNEA 12/08/2008  . Sleep apnea    uses c pap  . VITAMIN D DEFICIENCY 01/13/2010    Past Surgical History:  Procedure Laterality Date  . APPENDECTOMY  07/23/12  . CARPAL TUNNEL RELEASE  01/2010   bilateral  . COLONOSCOPY W/ BIOPSIES AND POLYPECTOMY  05/2009   2 tubular adenomas  . LAPAROSCOPIC APPENDECTOMY N/A 07/23/2012   Procedure: APPENDECTOMY LAPAROSCOPIC;  Surgeon: Gayland Curry, MD;  Location: [facility] OR;  Service:  General;  Laterality: N/A;    Social History   Socioeconomic History  . Marital status: Single    Spouse name: Not on file  . Number of children: 2  . Years of education: Not on file  . Highest education level: Not on file  Occupational History  . 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 Smoking status: Never Smoker  . Smokeless tobacco: Never Used  Vaping Use  . Vaping Use: Never used  Substance and Sexual Activity  . Alcohol use: No    Alcohol/week: 0.0 standard drinks  . Drug use: No  . Sexual activity: Not on file  Other Topics Concern  . Not on file  Social History Narrative   Daily Caffeine   Social Determinants of Health   Financial Resource Strain:   . Difficulty of Paying Living Expenses:   Food Insecurity:   . Worried About Charity fundraiser in the Last Year:   . Arboriculturist in the Last Year:   Transportation Needs:   . Film/video editor (Medical):   Marland Kitchen Lack of Transportation (Non-Medical):   Physical Activity:   . Days of Exercise per Week:   . Minutes of Exercise per Session:   Stress:   . Feeling of Stress :   Social Connections:   . Frequency of Communication with Friends and Family:   .  Frequency of Social Gatherings with Friends and Family:   . Attends Religious Services:   . Active Member of Clubs or Organizations:   . Attends Archivist Meetings:   Marland Kitchen Marital Status:   Intimate Partner Violence:   . Fear of Current or Ex-Partner:   . Emotionally Abused:   Marland Kitchen Physically Abused:   . Sexually Abused:     Current Outpatient Medications on File Prior to Visit  Medication Sig Dispense Refill  . atorvastatin (LIPITOR) 20 MG tablet TAKE 1 TABLET DAILY 90 tablet 3  . AUBAGIO 14 MG TABS TAKE 1 TABLET DAILY 90 tablet 3  . B-D UF III MINI PEN NEEDLES 31G X 5 MM MISC USE TO INJECT HUMALOG KWIKPEN INTO THE SKIN THREE TIMES A DAY 90 each 0  . baclofen (LIORESAL) 10 MG tablet TAKE 1 TABLET THREE TIMES  A DAY 270 tablet 3  . cetirizine (ZYRTEC) 10 MG tablet Take 10 mg by mouth daily.      . Cholecalciferol (VITAMIN D) 2000 UNITS CAPS Take 2 capsules by mouth every morning. Reported on 06/05/2015    . clotrimazole (MYCELEX) 10 MG troche Take 1 tablet (10 mg total) by mouth 5 (five) times daily. 35 tablet 2  . cyanocobalamin (,VITAMIN B-12,) 1000 MCG/ML injection Inject into the muscle every 30 (thirty) days.    Marland Kitchen dalfampridine (AMPYRA) 10 MG TB12 Take 1 tablet (10 mg total) by mouth 2 (two) times daily. 180 tablet 3  . furosemide (LASIX) 40 MG tablet TAKE 1 TABLET DAILY 90 tablet 3  . gabapentin (NEURONTIN) 300 MG capsule TAKE 2 CAPSULES IN THE MORNING, 1 CAPSULE IN THE AFTERNOON AND 2 CAPSULES IN THE EVENING 450 capsule 3  . glucose blood (FREESTYLE TEST STRIPS) test strip 1 each by Other route 2 (two) times daily. And lancets, 2 times daily. 180 each 3  . glucose monitoring kit (FREESTYLE) monitoring kit 1 each by Does not apply route as needed for other. Dispense any model that is covered- dispense testing supplies for Q AC/ HS accuchecks- 1 month supply with one refil. 1 each 1  . insulin lispro (HUMALOG KWIKPEN) 100 UNIT/ML KwikPen 22 units with breakfast, and 28 units with supper, and pen needles 3/day 15 pen 3  . Lancets (FREESTYLE) lancets Use to check blood sugar two time daily. 150 each 2  . loperamide (IMODIUM) 2 MG capsule Take 2 mg by mouth as needed for diarrhea or loose stools.    . medroxyPROGESTERone (DEPO-PROVERA) 150 MG/ML injection Inject 150 mg into the muscle every 3 (three) months.      . metoprolol tartrate (LOPRESSOR) 100 MG tablet TAKE 2 TABLETS DAILY 180 tablet 3  . nortriptyline (PAMELOR) 50 MG capsule TAKE 2 CAPSULES AT BEDTIME 180 capsule 3  . omeprazole-sodium bicarbonate (ZEGERID) 40-1100 MG per capsule Take 1 capsule by mouth daily.      . potassium chloride (KLOR-CON) 10 MEQ tablet TAKE 2 TABLETS DAILY 180 tablet 3  . traMADol (ULTRAM) 50 MG tablet TAKE 1 TABLET BY  MOUTH EVERY 6 HOURS AS NEEDED 20 tablet 5  . Vitamin D, Ergocalciferol, (DRISDOL) 1.25 MG (50000 UT) CAPS capsule Take 1 capsule (50,000 Units total) by mouth every 7 (seven) days. 12 capsule 0   Current Facility-Administered Medications on File Prior to Visit  Medication Dose Route Frequency Provider Last Rate Last Admin  . cyanocobalamin ((VITAMIN B-12)) injection 1,000 mcg  1,000 mcg Intramuscular Once Biagio Borg, MD  Allergies  Allergen Reactions  . Ciprofloxacin In D5w Rash  . Penicillins     REACTION: rash  . Tricor [Fenofibrate] Rash    Family History  Problem Relation Age of Onset  . Migraines Mother   . Hypertension Father   . COPD Father   . Diabetes Father   . Prostate cancer Maternal Uncle        prostate cancer  . Lung cancer Maternal Grandmother   . Stroke Maternal Grandfather   . Heart attack Maternal Grandfather   . Diabetes Other   . Hypertension Other   . Asthma Daughter   . Colon cancer Neg Hx   . Esophageal cancer Neg Hx   . Rectal cancer Neg Hx   . Stomach cancer Neg Hx     BP 114/72   Pulse 78   Ht '5\' 1"'  (1.549 m)   Wt 205 lb (93 kg)   SpO2 98%   BMI 38.73 kg/m    Review of Systems She denies hypoglycemia and nausea.      Objective:   Physical Exam VITAL SIGNS:  See vs page GENERAL: no distress Pulses: dorsalis pedis intact bilat.   MSK: no deformity of the feet CV: trace bilat leg edema Skin:  no ulcer on the feet.  normal color and temp on the feet. Neuro: sensation is intact to touch on the feet  Lab Results  Component Value Date   HGBA1C 7.8 (A) 10/16/2019   Lab Results  Component Value Date   CREATININE 0.72 05/22/2019   BUN 11 05/22/2019   NA 140 05/22/2019   K 3.6 05/22/2019   CL 102 05/22/2019   CO2 28 05/22/2019       Assessment & Plan:  Insulin-requiring type 2 DM: She would benefit from increased rx, if it can be done with a regimen that avoids or minimizes hypoglycemia.   Patient Instructions  I  have sent a prescription to your pharmacy, to change Januvia to Rybelsus, and: Reduce the metformin to 2 pills per day, and: Please continue the same insulin, and: I have sent a prescription to your pharmacy, for the continuous glucose monitor Please call if you need help getting started with this.   check your blood sugar twice a day.  vary the time of day when you check, between before the 3 meals, and at bedtime.  also check if you have symptoms of your blood sugar being too high or too low.  please keep a record of the readings and bring it to your next appointment here (or you can bring the meter itself).  You can write it on any piece of paper.  please call us sooner if your blood sugar goes below 70, or if you have a lot of readings over 200. Please come back for a follow-up appointment in 2 months.

## 2019-10-22 ENCOUNTER — Other Ambulatory Visit: Payer: Self-pay | Admitting: Endocrinology

## 2019-10-22 ENCOUNTER — Other Ambulatory Visit: Payer: Self-pay | Admitting: Neurology

## 2019-10-22 DIAGNOSIS — Z79899 Other long term (current) drug therapy: Secondary | ICD-10-CM

## 2019-10-22 DIAGNOSIS — E559 Vitamin D deficiency, unspecified: Secondary | ICD-10-CM

## 2019-10-22 DIAGNOSIS — G35 Multiple sclerosis: Secondary | ICD-10-CM

## 2019-10-22 MED ORDER — DEXCOM G6 SENSOR MISC: 1.0000 | 9 refills | Status: DC

## 2019-10-22 MED ORDER — DEXCOM G6 RECEIVER DEVI: 1.0000 | Freq: Once | 0 refills | Status: AC

## 2019-10-22 MED ORDER — DEXCOM G6 TRANSMITTER MISC: 1.0000 | Freq: Once | 0 refills | Status: AC

## 2019-10-23 ENCOUNTER — Other Ambulatory Visit: Payer: Self-pay

## 2019-10-23 ENCOUNTER — Ambulatory Visit (INDEPENDENT_AMBULATORY_CARE_PROVIDER_SITE_OTHER): Payer: Medicare Other

## 2019-10-23 DIAGNOSIS — Z794 Long term (current) use of insulin: Secondary | ICD-10-CM

## 2019-10-23 DIAGNOSIS — E538 Deficiency of other specified B group vitamins: Secondary | ICD-10-CM | POA: Diagnosis not present

## 2019-10-23 MED ORDER — DEXCOM G6 SENSOR MISC: 1.0000 | 2 refills | Status: DC

## 2019-10-23 MED ORDER — CYANOCOBALAMIN 1000 MCG/ML IJ SOLN
1000.0000 ug | INTRAMUSCULAR | Status: AC
  Administered 2019-10-23 – 2020-10-07 (×5): 1000 ug via INTRAMUSCULAR

## 2019-10-23 NOTE — Progress Notes (Signed)
Pt here for monthly B12 injection per Dr Jenny Reichmann.  B12 1071mcg given IM right delitoid and pt tolerated injection well.  Pt to schedule next injection appt upon check out. Confirmed with MD that he would like pt to continue monthly B12 injections & order written as such.

## 2019-10-30 ENCOUNTER — Ambulatory Visit (HOSPITAL_COMMUNITY)
Admission: RE | Admit: 2019-10-30 | Discharge: 2019-10-30 | Disposition: A | Discharge status: Home / Self Care | Payer: Medicare Other | Source: Ambulatory Visit | Attending: Neurology | Admitting: Neurology

## 2019-10-30 ENCOUNTER — Other Ambulatory Visit: Payer: Self-pay

## 2019-10-30 DIAGNOSIS — G35 Multiple sclerosis: Secondary | ICD-10-CM | POA: Insufficient documentation

## 2019-10-30 DIAGNOSIS — H7093 Unspecified mastoiditis, bilateral: Secondary | ICD-10-CM | POA: Diagnosis not present

## 2019-10-30 DIAGNOSIS — R9082 White matter disease, unspecified: Secondary | ICD-10-CM | POA: Diagnosis not present

## 2019-10-30 DIAGNOSIS — M50122 Cervical disc disorder at C5-C6 level with radiculopathy: Secondary | ICD-10-CM | POA: Diagnosis not present

## 2019-10-30 MED ORDER — GADOBUTROL 1 MMOL/ML IV SOLN
10.0000 mL | Freq: Once | INTRAVENOUS | Status: AC | PRN
  Administered 2019-10-30: 10 mL via INTRAVENOUS

## 2019-10-30 NOTE — Progress Notes (Signed)
NEUROLOGY FOLLOW UP OFFICE NOTE  LOISTINE EBERLIN [id number]  HISTORY OF PRESENT ILLNESS: Courtney [name] is a 44 year old right-handed Caucasian woman who follows up for multiple sclerosis.  UPDATE: Current DMT:  Aubagio Other current medications: Baclofen 10 mg, gabapentin 6105m/300mg/600mg, D39000 IU daily, B121000 mcg injection every 30 days, nortriptyline 100 mg, tramadol 50 mg (sparingly)  05/22/2019 LABS:  BMP with Na 140, K 3.6, Cl 102, CO2 28, glucose 134, BUN 11, Cr 0.72. 06/12/2019 LABS:  CBC with WBC 8.1, HGB 12.3, HCT 37.4, PLT 279, ALC 1.9; hepatic panel with t bili 0.4, ALP 68, AST 15, ALT 19; vit D 52.34; B12 291  MRI brain and cervical spine with and without contrast yesterday was personally reviewed and demonstrated no significant change compared to prior imaging from 2019.  Vision:No Motor:notes residual right upper and lower extremity weakness Sensory:may get facial numbness with heat and stress.  Pain:Occasional pain and tightness in her lower extremities. Gait:okay. Bowel/Bladder:No change Fatigue:No Cognition:Sometimes she has trouble remembering new information Mood:Significant stress.  Her mother is very ill.  She is currently in the hospital.  HISTORY: Onset of symptoms and diagnosis occurred in 2006. She experienced an episode of numbness in her right arm and both lower extremities. She was dropping objects. MRI of brain reportedly showed solitary right mid-to-posterior frontal periventricular white matter lesion. MRI of spine revealed no cord demyelination. CSF analysis from lumbar puncture reportedly revealed oligoclonal bands and an elevated IgG index. She was diagnosed with clinically isolated syndrome but was officially diagnosed with multiple sclerosis a few months later when she developed a relapse with left hand numbness. In February 2013, she developed left arm pain and swelling. MRI of cervical spine revealed an enhancing spinal  cord lesion at C4 on the right. She was treated with IV Solumedrol. In 2014, she had an episode of optic neuritis in her right eye, treated with IV Solumedrol.  In September-October 2019, she reported to 4 weeks history of tingling in her face, which may occur off and on for 10 minutes 3 times a week. She was prescribed a prednisone taper. Compared to prior MRI from 02/07/2017, repeat MRI of the brain and cervical spine with and without contrast from 02/19/2018, whichwere personally reviewed,demonstrated mildly progressed bifrontal cerebral white matter changes as well as probable additional abnormal signal in the left cord at the C6-7 level but no abnormal enhancement to suggestactivedemyelinating disease.Questionable if paresthesias were secondary to B12 deficiency as well.  There is no family history of MS.  Past disease modifying therapy: Avonex (difficulty injecting), Rebif (painful injections), Tecfidera (GI side effects)  Imaging: 06/03/11: MRI brain with and without contrast: single right frontal periventricular white matter lesion with high T2 FLAIR signal 06/03/11: MRI cervical spine with and without contrast: abnormal rim enhancing lesion in the spinal cord at the level of C4 on the right which mildly expands the cord. 01/21/12: MRI brain with and without contrast: unchanged nonspecific right periventricular white matter lesion 01/21/12: MRI cervical spine with and without contrast: stable nonenhancing cord lesion at C4 11/14/12: MRI brain with and without contrast. No change 11/14/12: MRI cervical spine with and without contrast: No change 12/02/13: MRI brain with and without contrast: 1. No substantial change in the right frontal juxtacortical, right corona radiata, and left periatrial white matter T2/FLAIR hyperintensities. While nonspecific, these likely reflect demyelinating lesions in this patient with a history of multiple sclerosis and concomitant cervical cord lesion.  No new lesions are identified and none  of the existing lesions enhance or restrict diffusion. 2. Subcentimeter left parafalcine dural based lesion as described above has slowly enlarged over time and demonstrates equivocal enhancement, but may represent a meningioma.Recommend attention on follow up imaging. 12/02/13: MRI cervical spine with and without contrast: No change 12/15/14: MRI brain with and without contrast: 1.Similar appearance of focal T2/FLAIR hyperintense white matter lesion within the right lateral body of the corpus callosum without associated enhancement to suggest active demyelination. 2.No new or enlarging white matter lesions identified. 3.Slowly enlarging nonenhancing nodular lesion along the left anterior aspect of the falx cerebri, which may reflect a small meningioma. 12/15/14: MRI cervical spine with and without contrast: no change 10/10/15: MRI brain with and without contrast: no change 10/10/15: MRI cervical spine with and without contrast: no change 02/07/17: MRI brain with and without contrast: No significant change since 12/24/2008 02/07/2017: MRI cervical spine with and without contrast: Chronic lesion at C4 level, stable since 2013 02/19/18: MRI brain with and without contrast: Compared to prior imaging from 02/07/17, mildly progressed bifrontal cerebral white matter changes, right greater than left, but no abnormal enhancement to suggest active disease. 02/19/18: MRI cervical spine with and without contrast: Again demonstrated chronic lesion at C4 level as well as probable subtle patchy cord signal abnormality within the left cord at level of C6-7 not definitely seen on prior imaging from 02/07/17 but no abnormal enhancement to suggest active disease.  PAST MEDICAL HISTORY: Past Medical History:  Diagnosis Date   ALLERGIC RHINITIS 03/13/2007   Allergy    Anemia, B12 deficiency    Anxiety    C. difficile colitis    CARPAL TUNNEL SYNDROME,  BILATERAL 01/13/2010   Cervical lesion 07/13/2011   Spine lesion - indeterminate, for f/u MRI may 2013 per neurology   Chronic fatigue    CTS (carpal tunnel syndrome)    Diabetes mellitus without complication ([place])    ESSENTIAL HYPERTENSION 03/13/2007   Fibromyalgia    GERD (gastroesophageal reflux disease)    Heart murmur    Hx of adenomatous polyp of colon 06/09/2009   Hyperlipidemia    HYPERLIPIDEMIA 03/13/2007   Irritable bowel syndrome 05/24/2010   Multiple sclerosis ([place])    OBSTRUCTIVE SLEEP APNEA 12/08/2008   Sleep apnea    uses c pap   VITAMIN D DEFICIENCY 01/13/2010    MEDICATIONS: Current Outpatient Medications on File Prior to Visit  Medication Sig Dispense Refill   atorvastatin (LIPITOR) 20 MG tablet TAKE 1 TABLET DAILY 90 tablet 3   AUBAGIO 14 MG TABS TAKE 1 TABLET DAILY 90 tablet 3   B-D UF III MINI PEN NEEDLES 31G X 5 MM MISC USE TO INJECT HUMALOG KWIKPEN INTO THE SKIN THREE TIMES A DAY 90 each 0   baclofen (LIORESAL) 10 MG tablet TAKE 1 TABLET THREE TIMES A DAY 270 tablet 3   cetirizine (ZYRTEC) 10 MG tablet Take 10 mg by mouth daily.       Cholecalciferol (VITAMIN D) 2000 UNITS CAPS Take 2 capsules by mouth every morning. Reported on 06/05/2015     clotrimazole (MYCELEX) 10 MG troche Take 1 tablet (10 mg total) by mouth 5 (five) times daily. 35 tablet 2   Continuous Blood Gluc Sensor (DEXCOM G6 SENSOR) MISC 1 Device by Does not apply route See admin instructions. Change sensor every 10 days; E11.9 3 each 2   cyanocobalamin (,VITAMIN B-12,) 1000 MCG/ML injection Inject into the muscle every 30 (thirty) days.     dalfampridine (AMPYRA) 10 MG TB12 Take  1 tablet (10 mg total) by mouth 2 (two) times daily. 180 tablet 3   furosemide (LASIX) 40 MG tablet TAKE 1 TABLET DAILY 90 tablet 3   gabapentin (NEURONTIN) 300 MG capsule TAKE 2 CAPSULES IN THE MORNING, 1 CAPSULE IN THE AFTERNOON AND 2 CAPSULES IN THE EVENING 450 capsule 3   glucose blood  (FREESTYLE TEST STRIPS) test strip 1 each by Other route 2 (two) times daily. And lancets, 2 times daily. 180 each 3   glucose monitoring kit (FREESTYLE) monitoring kit 1 each by Does not apply route as needed for other. Dispense any model that is covered- dispense testing supplies for Q AC/ HS accuchecks- 1 month supply with one refil. 1 each 1   insulin lispro (HUMALOG KWIKPEN) 100 UNIT/ML KwikPen 22 units with breakfast, and 28 units with supper, and pen needles 3/day 15 pen 3   Lancets (FREESTYLE) lancets Use to check blood sugar two time daily. 150 each 2   loperamide (IMODIUM) 2 MG capsule Take 2 mg by mouth as needed for diarrhea or loose stools.     medroxyPROGESTERone (DEPO-PROVERA) 150 MG/ML injection Inject 150 mg into the muscle every 3 (three) months.       metFORMIN (GLUCOPHAGE-XR) 500 MG 24 hr tablet Take 2 tablets (1,000 mg total) by mouth daily. 180 tablet 3   metoprolol tartrate (LOPRESSOR) 100 MG tablet TAKE 2 TABLETS DAILY 180 tablet 3   nortriptyline (PAMELOR) 50 MG capsule TAKE 2 CAPSULES AT BEDTIME 180 capsule 3   omeprazole-sodium bicarbonate (ZEGERID) 40-1100 MG per capsule Take 1 capsule by mouth daily.       potassium chloride (KLOR-CON) 10 MEQ tablet TAKE 2 TABLETS DAILY 180 tablet 3   Semaglutide (RYBELSUS) 3 MG TABS Take 3 mg by mouth daily. 90 tablet 3   traMADol (ULTRAM) 50 MG tablet TAKE 1 TABLET BY MOUTH EVERY 6 HOURS AS NEEDED 20 tablet 5   Vitamin D, Ergocalciferol, (DRISDOL) 1.25 MG (50000 UT) CAPS capsule Take 1 capsule (50,000 Units total) by mouth every 7 (seven) days. 12 capsule 0   Current Facility-Administered Medications on File Prior to Visit  Medication Dose Route Frequency Provider Last Rate Last Admin   cyanocobalamin ((VITAMIN B-12)) injection 1,000 mcg  1,000 mcg Intramuscular Once Biagio Borg, MD       cyanocobalamin ((VITAMIN B-12)) injection 1,000 mcg  1,000 mcg Intramuscular Q30 days Biagio Borg, MD   1,000 mcg at 10/23/19  1010    ALLERGIES: Allergies  Allergen Reactions   Ciprofloxacin In D5w Rash   Penicillins     REACTION: rash   Tricor [Fenofibrate] Rash    FAMILY HISTORY: Family History  Problem Relation Age of Onset   Migraines Mother    Hypertension Father    COPD Father    Diabetes Father    Prostate cancer Maternal Uncle        prostate cancer   Lung cancer Maternal Grandmother    Stroke Maternal Grandfather    Heart attack Maternal Grandfather    Diabetes Other    Hypertension Other    Asthma Daughter    Colon cancer Neg Hx    Esophageal cancer Neg Hx    Rectal cancer Neg Hx    Stomach cancer Neg Hx     SOCIAL HISTORY: Social History   Socioeconomic History   Marital status: Single    Spouse name: Not on file   Number of children: 2   Years of education: Not on file  Highest education level: Not on file  Occupational History   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    Employer: UNEMPLOYED  Tobacco Use   Smoking status: Never Smoker   Smokeless tobacco: Never Used  Scientific laboratory technician Use: Never used  Substance and Sexual Activity   Alcohol use: No    Alcohol/week: 0.0 standard drinks   Drug use: No   Sexual activity: Not on file  Other Topics Concern   Not on file  Social History Narrative   Daily Caffeine   Social Determinants of Health   Financial Resource Strain:    Difficulty of Paying Living Expenses:   Food Insecurity:    Worried About Charity fundraiser in the Last Year:    Arboriculturist in the Last Year:   Transportation Needs:    Film/video editor (Medical):    Lack of Transportation (Non-Medical):   Physical Activity:    Days of Exercise per Week:    Minutes of Exercise per Session:   Stress:    Feeling of Stress :   Social Connections:    Frequency of Communication with Friends and Family:    Frequency of Social Gatherings with Friends and Family:    Attends Religious Services:    Active  Member of Clubs or Organizations:    Attends Music therapist:    Marital Status:   Intimate Partner Violence:    Fear of Current or Ex-Partner:    Emotionally Abused:    Physically Abused:    Sexually Abused:     PHYSICAL EXAM: Blood pressure (!) 174/124, pulse 81, resp. rate 18, height 5' 1" (1.549 m), weight 205 lb (93 kg), SpO2 97 %. General: No acute distress.  Patient appears well-groomed.   Head:  Normocephalic/atraumatic Eyes:  Fundi examined but not visualized Neck: supple, no paraspinal tenderness, full range of motion Heart:  Regular rate and rhythm Lungs:  Clear to auscultation bilaterally Back: No paraspinal tenderness Neurogical Exam: alert and oriented to person, place, and time. Attention span and concentration intact, recent and remote memory intact, fund of knowledge intact.  Speech fluent and not dysarthric, language intact.  CN II-XII intact. Bulk and tone normal, muscle strength 5/5 throughout.  Sensation to pinprick mildly reduced in right upper and lower extremities, mildly reduced vibratory sensation in right upper extremity.  Deep tendon reflexes 2+ throughout, toes downgoing.  Finger to nose and heel to shin testing intact.  Gait normal, Romberg negative.  IMPRESSION: 1.  Multiple sclerosis.  Stable over past 2 years.   2.  Elevated blood pressure, likely related to emotional stress  PLAN: 1.  DMT:  Aubagio.   2.  Gabapentin 624m/300mg/600mg and nortriptyline 1069mat bedtime 3.  D3 9000 IU daily 4.  Check CBC with diff, CMP and vit D level today and again in 6 months. 5.  Follow up in 6 months    AdMetta ClinesDO  CC: JaCathlean CowerMD

## 2019-10-31 ENCOUNTER — Ambulatory Visit (INDEPENDENT_AMBULATORY_CARE_PROVIDER_SITE_OTHER): Payer: Medicare Other | Admitting: Neurology

## 2019-10-31 ENCOUNTER — Encounter: Payer: Self-pay | Admitting: Neurology

## 2019-10-31 ENCOUNTER — Other Ambulatory Visit (INDEPENDENT_AMBULATORY_CARE_PROVIDER_SITE_OTHER): Payer: Medicare Other

## 2019-10-31 ENCOUNTER — Other Ambulatory Visit: Payer: Self-pay

## 2019-10-31 VITALS — BP 174/124 | HR 81 | Resp 18 | Ht 61.0 in | Wt 205.0 lb

## 2019-10-31 DIAGNOSIS — G35 Multiple sclerosis: Secondary | ICD-10-CM | POA: Diagnosis not present

## 2019-10-31 DIAGNOSIS — E559 Vitamin D deficiency, unspecified: Secondary | ICD-10-CM | POA: Diagnosis not present

## 2019-10-31 DIAGNOSIS — R030 Elevated blood-pressure reading, without diagnosis of hypertension: Secondary | ICD-10-CM

## 2019-10-31 DIAGNOSIS — R5383 Other fatigue: Secondary | ICD-10-CM

## 2019-10-31 LAB — CBC WITH DIFFERENTIAL/PLATELET
Basophils Absolute: 0.1 10*3/uL (ref 0.0–0.1)
Basophils Relative: 0.9 % (ref 0.0–3.0)
Eosinophils Absolute: 0.3 10*3/uL (ref 0.0–0.7)
Eosinophils Relative: 5 % (ref 0.0–5.0)
HCT: 34.1 % — ABNORMAL LOW (ref 36.0–46.0)
Hemoglobin: 11.4 g/dL — ABNORMAL LOW (ref 12.0–15.0)
Lymphocytes Relative: 22.8 % (ref 12.0–46.0)
Lymphs Abs: 1.5 10*3/uL (ref 0.7–4.0)
MCHC: 33.4 g/dL (ref 30.0–36.0)
MCV: 80.2 fl (ref 78.0–100.0)
Monocytes Absolute: 0.4 10*3/uL (ref 0.1–1.0)
Monocytes Relative: 6.2 % (ref 3.0–12.0)
Neutro Abs: 4.3 10*3/uL (ref 1.4–7.7)
Neutrophils Relative %: 65.1 % (ref 43.0–77.0)
Platelets: 252 10*3/uL (ref 150.0–400.0)
RBC: 4.25 Mil/uL (ref 3.87–5.11)
RDW: 16.7 % — ABNORMAL HIGH (ref 11.5–15.5)
WBC: 6.6 10*3/uL (ref 4.0–10.5)

## 2019-10-31 LAB — COMPREHENSIVE METABOLIC PANEL
ALT: 17 U/L (ref 0–35)
AST: 15 U/L (ref 0–37)
Albumin: 4 g/dL (ref 3.5–5.2)
Alkaline Phosphatase: 64 U/L (ref 39–117)
BUN: 8 mg/dL (ref 6–23)
CO2: 26 mEq/L (ref 19–32)
Calcium: 9.4 mg/dL (ref 8.4–10.5)
Chloride: 101 mEq/L (ref 96–112)
Creatinine, Ser: 0.77 mg/dL (ref 0.40–1.20)
GFR: 81.64 mL/min (ref >60.00)
Glucose, Bld: 269 mg/dL — ABNORMAL HIGH (ref 70–99)
Potassium: 3.4 mEq/L — ABNORMAL LOW (ref 3.5–5.1)
Sodium: 138 mEq/L (ref 135–145)
Total Bilirubin: 0.5 mg/dL (ref 0.2–1.2)
Total Protein: 6.7 g/dL (ref 6.0–8.3)

## 2019-10-31 LAB — VITAMIN D 25 HYDROXY (VIT D DEFICIENCY, FRACTURES): VITD: 44.37 ng/mL (ref 30.00–100.00)

## 2019-10-31 NOTE — Patient Instructions (Addendum)
1.  Check CBC with diff, CMP and vitamin D today and again in 6 months 2.  Follow up in 6 months (after repeat labs)  Your provider has requested that you have labwork completed today. Please go to [facility] ([id number]) on the second floor of this building before leaving the office today. You do not need to check in. If you are not called within 15 minutes please check with the front desk.

## 2019-11-14 ENCOUNTER — Telehealth: Payer: Self-pay

## 2019-11-14 NOTE — Telephone Encounter (Signed)
FAXED [facility] records request Other records requested: None  NOTE - Supporting documentation for pt requested CGM. She is aware that this will likely result in a denial.  All above requested information has been faxed successfully to the Company listed above. Documents and fax confirmation have been placed in the faxed file for future reference.

## 2019-11-19 NOTE — Telephone Encounter (Signed)
FAXED [place]: [place] request  Document: Office notes Note - This request was faxed successfully on 11/14/19  All above requested information has AGAIN been faxed successfully to [employer] listed above. Documents and fax confirmation have been placed in the faxed file for future reference.

## 2019-11-25 ENCOUNTER — Other Ambulatory Visit: Payer: Self-pay

## 2019-11-25 ENCOUNTER — Ambulatory Visit (INDEPENDENT_AMBULATORY_CARE_PROVIDER_SITE_OTHER): Payer: Medicare Other | Admitting: *Deleted

## 2019-11-25 ENCOUNTER — Encounter: Payer: Self-pay | Admitting: Endocrinology

## 2019-11-25 DIAGNOSIS — E538 Deficiency of other specified B group vitamins: Secondary | ICD-10-CM | POA: Diagnosis not present

## 2019-11-25 DIAGNOSIS — Z794 Long term (current) use of insulin: Secondary | ICD-10-CM

## 2019-11-25 MED ORDER — CYANOCOBALAMIN 1000 MCG/ML IJ SOLN
1000.0000 ug | Freq: Once | INTRAMUSCULAR | Status: AC
  Administered 2019-11-25: 1000 ug via INTRAMUSCULAR

## 2019-11-25 MED ORDER — DEXCOM G6 SENSOR MISC: 1.0000 | 2 refills | Status: DC

## 2019-11-25 NOTE — Telephone Encounter (Signed)
I have faxed office notes as requested for Dexcom PA. Pt does not qualify according to your notes. If she is wanting to proceed with Dexcom, she would need to pay out of pocket. In addition, this means the Rx can be sent wherever she would like, so long as Rx is written "DOES NOT QUALIFY. WILL PAY OUT OF POCKET". Please review pt request below

## 2019-11-25 NOTE — Progress Notes (Signed)
Pls cosign for B12 inj in absence of PCP../lmb   

## 2019-11-25 NOTE — Telephone Encounter (Signed)
To qualify, your notes must indicate checking CBG's 4 or more times per day and injecting insulin 3 or more times per day. Insurance denied d/t your notes not indicating BOTH of these requirements.

## 2019-12-11 ENCOUNTER — Ambulatory Visit: Payer: Medicare Other | Admitting: Internal Medicine

## 2019-12-11 ENCOUNTER — Ambulatory Visit (INDEPENDENT_AMBULATORY_CARE_PROVIDER_SITE_OTHER): Payer: Medicare Other | Admitting: *Deleted

## 2019-12-11 ENCOUNTER — Other Ambulatory Visit: Payer: Self-pay

## 2019-12-11 DIAGNOSIS — Z309 Encounter for contraceptive management, unspecified: Secondary | ICD-10-CM

## 2019-12-11 MED ORDER — MEDROXYPROGESTERONE ACETATE 150 MG/ML IM SUSP
150.0000 mg | Freq: Once | INTRAMUSCULAR | Status: AC
  Administered 2019-12-11: 150 mg via INTRAMUSCULAR

## 2019-12-11 NOTE — Progress Notes (Signed)
Pls cosign for Depro Provera inj in absence...Courtney [name]

## 2019-12-18 ENCOUNTER — Ambulatory Visit: Payer: Medicare Other | Admitting: Endocrinology

## 2019-12-20 ENCOUNTER — Ambulatory Visit (INDEPENDENT_AMBULATORY_CARE_PROVIDER_SITE_OTHER): Payer: Medicare Other | Admitting: Internal Medicine

## 2019-12-20 ENCOUNTER — Other Ambulatory Visit: Payer: Self-pay

## 2019-12-20 ENCOUNTER — Encounter: Payer: Self-pay | Admitting: Internal Medicine

## 2019-12-20 VITALS — BP 142/88 | HR 86 | Temp 98.4°F | Ht 61.0 in | Wt 201.0 lb

## 2019-12-20 DIAGNOSIS — E119 Type 2 diabetes mellitus without complications: Secondary | ICD-10-CM

## 2019-12-20 DIAGNOSIS — I10 Essential (primary) hypertension: Secondary | ICD-10-CM | POA: Diagnosis not present

## 2019-12-20 DIAGNOSIS — M170 Bilateral primary osteoarthritis of knee: Secondary | ICD-10-CM | POA: Diagnosis not present

## 2019-12-20 DIAGNOSIS — E785 Hyperlipidemia, unspecified: Secondary | ICD-10-CM | POA: Diagnosis not present

## 2019-12-20 DIAGNOSIS — Z794 Long term (current) use of insulin: Secondary | ICD-10-CM | POA: Diagnosis not present

## 2019-12-20 DIAGNOSIS — Z1159 Encounter for screening for other viral diseases: Secondary | ICD-10-CM

## 2019-12-20 NOTE — Patient Instructions (Addendum)
Please remember to have your yearly pap smear and eye exam  Please check your BP at home for 10 days at random times and call for more BP treatment if the average is more than 140/90 (though the cardiologists are saying 130/80 some these days)  Please continue all other medications as before, and refills have been done if requested.  Please have the pharmacy call with any other refills you may need.  Please continue your efforts at being more active, low cholesterol diet, and weight control.  Please keep your appointments with your specialists as you may have planned  Please make an Appointment to return in 6 months, or sooner if needed

## 2019-12-20 NOTE — Progress Notes (Signed)
Subjective:    Patient ID: Courtney [name], female    DOB: 1976/02/06, 44 y.o.   MRN: [id number]  HPI  Here to f/u; overall doing ok,  Pt denies chest pain, increasing sob or doe, wheezing, orthopnea, PND, increased LE swelling, palpitations, dizziness or syncope.  Pt denies new neurological symptoms such as new headache, or facial or extremity weakness or numbness.  Pt denies polydipsia, polyuria, or low sugar episode.  Pt states overall good compliance with meds, mostly trying to follow appropriate diet, with wt overall stable,  but little exercise however. Has bilateral knee djd with lower leg swelling due to this, plans to f/u sport med dr Tamala Julian.  BP at home < 140/90 but not check very recently/ BP Readings from Last 3 Encounters:  12/20/19 (!) 142/88  10/31/19 (!) 174/124  10/16/19 114/72   Past Medical History:  Diagnosis Date  . ALLERGIC RHINITIS 03/13/2007  . Allergy   . Anemia, B12 deficiency   . Anxiety   . C. difficile colitis   . CARPAL TUNNEL SYNDROME, BILATERAL 01/13/2010  . Cervical lesion 07/13/2011   Spine lesion - indeterminate, for f/u MRI may 2013 per neurology  . Chronic fatigue   . CTS (carpal tunnel syndrome)   . Diabetes mellitus without complication ([facility])   . ESSENTIAL HYPERTENSION 03/13/2007  . Fibromyalgia   . GERD (gastroesophageal reflux disease)   . Heart murmur   . Hx of adenomatous polyp of colon 06/09/2009  . Hyperlipidemia   . HYPERLIPIDEMIA 03/13/2007  . Irritable bowel syndrome 05/24/2010  . Multiple sclerosis ([place])   . OBSTRUCTIVE SLEEP APNEA 12/08/2008  . Sleep apnea    uses c pap  . VITAMIN D DEFICIENCY 01/13/2010   Past Surgical History:  Procedure Laterality Date  . APPENDECTOMY  07/23/12  . CARPAL TUNNEL RELEASE  01/2010   bilateral  . COLONOSCOPY W/ BIOPSIES AND POLYPECTOMY  05/2009   2 tubular adenomas  . LAPAROSCOPIC APPENDECTOMY N/A 07/23/2012   Procedure: APPENDECTOMY LAPAROSCOPIC;  Surgeon: Gayland Curry, MD;  Location: [facility];   Service: General;  Laterality: N/A;    reports that she has never smoked. She has never used smokeless tobacco. She reports that she does not drink alcohol and does not use drugs. family history includes Asthma in her daughter; COPD in her father; Diabetes in her father and another family member; Heart attack in her maternal grandfather; Hypertension in her father and another family member; Lung cancer in her maternal grandmother; Migraines in her mother; Prostate cancer in her maternal uncle; Stroke in her maternal grandfather. Allergies  Allergen Reactions  . Ciprofloxacin In D5w Rash  . Penicillins     REACTION: rash  . Tricor [Fenofibrate] Rash   Current Outpatient Medications on File Prior to Visit  Medication Sig Dispense Refill  . atorvastatin (LIPITOR) 20 MG tablet TAKE 1 TABLET DAILY 90 tablet 3  . AUBAGIO 14 MG TABS TAKE 1 TABLET DAILY 90 tablet 3  . B-D UF III MINI PEN NEEDLES 31G X 5 MM MISC USE TO INJECT HUMALOG KWIKPEN INTO THE SKIN THREE TIMES A DAY 90 each 0  . baclofen (LIORESAL) 10 MG tablet TAKE 1 TABLET THREE TIMES A DAY 270 tablet 3  . cetirizine (ZYRTEC) 10 MG tablet Take 10 mg by mouth daily.      . Cholecalciferol (VITAMIN D) 2000 UNITS CAPS Take 2 capsules by mouth every morning. Reported on 06/05/2015    . clotrimazole (MYCELEX) 10 MG troche Take 1  tablet (10 mg total) by mouth 5 (five) times daily. 35 tablet 2  . Continuous Blood Gluc Sensor (DEXCOM G6 SENSOR) MISC 1 Device by Does not apply route See admin instructions. PT DOES NOT QUALIFY. PA WILL NOT BE COMPLETED. WILL PAY OUT OF POCKET 3 each 2  . cyanocobalamin (,VITAMIN B-12,) 1000 MCG/ML injection Inject into the muscle every 30 (thirty) days.    Marland Kitchen dalfampridine (AMPYRA) 10 MG TB12 Take 1 tablet (10 mg total) by mouth 2 (two) times daily. 180 tablet 3  . furosemide (LASIX) 40 MG tablet TAKE 1 TABLET DAILY 90 tablet 3  . gabapentin (NEURONTIN) 300 MG capsule TAKE 2 CAPSULES IN THE MORNING, 1 CAPSULE IN THE  AFTERNOON AND 2 CAPSULES IN THE EVENING 450 capsule 3  . glucose blood (FREESTYLE TEST STRIPS) test strip 1 each by Other route 2 (two) times daily. And lancets, 2 times daily. 180 each 3  . glucose monitoring kit (FREESTYLE) monitoring kit 1 each by Does not apply route as needed for other. Dispense any model that is covered- dispense testing supplies for Q AC/ HS accuchecks- 1 month supply with one refil. 1 each 1  . insulin lispro (HUMALOG KWIKPEN) 100 UNIT/ML KwikPen 22 units with breakfast, and 28 units with supper, and pen needles 3/day 15 pen 3  . Lancets (FREESTYLE) lancets Use to check blood sugar two time daily. 150 each 2  . loperamide (IMODIUM) 2 MG capsule Take 2 mg by mouth as needed for diarrhea or loose stools.    . medroxyPROGESTERone (DEPO-PROVERA) 150 MG/ML injection Inject 150 mg into the muscle every 3 (three) months.      . metFORMIN (GLUCOPHAGE-XR) 500 MG 24 hr tablet Take 2 tablets (1,000 mg total) by mouth daily. 180 tablet 3  . metoprolol tartrate (LOPRESSOR) 100 MG tablet TAKE 2 TABLETS DAILY 180 tablet 3  . nortriptyline (PAMELOR) 50 MG capsule TAKE 2 CAPSULES AT BEDTIME 180 capsule 3  . omeprazole-sodium bicarbonate (ZEGERID) 40-1100 MG per capsule Take 1 capsule by mouth daily.      . potassium chloride (KLOR-CON) 10 MEQ tablet TAKE 2 TABLETS DAILY 180 tablet 3  . Semaglutide (RYBELSUS) 3 MG TABS Take 3 mg by mouth daily. 90 tablet 3  . traMADol (ULTRAM) 50 MG tablet TAKE 1 TABLET BY MOUTH EVERY 6 HOURS AS NEEDED 20 tablet 5  . Vitamin D, Ergocalciferol, (DRISDOL) 1.25 MG (50000 UT) CAPS capsule Take 1 capsule (50,000 Units total) by mouth every 7 (seven) days. 12 capsule 0   Current Facility-Administered Medications on File Prior to Visit  Medication Dose Route Frequency Provider Last Rate Last Admin  . cyanocobalamin ((VITAMIN B-12)) injection 1,000 mcg  1,000 mcg Intramuscular Once Biagio Borg, MD      . cyanocobalamin ((VITAMIN B-12)) injection 1,000 mcg  1,000  mcg Intramuscular Q30 days Biagio Borg, MD   1,000 mcg at 10/23/19 1010   Review of Systems All otherwise neg per pt     Objective:   Physical Exam BP (!) 142/88   Pulse 86   Temp 98.4 F (36.9 C) (Oral)   Ht _0  (1.549 m)   Wt 201 lb (91.2 kg)   SpO2 98%   BMI 37.98 kg/m  VS noted,  Constitutional: Pt appears in NAD HENT: Head: NCAT.  Right Ear: External ear normal.  Left Ear: External ear normal.  Eyes: . Pupils are equal, round, and reactive to light. Conjunctivae and EOM are normal Nose: without d/c or deformity  Neck: Neck supple. Gross normal ROM Cardiovascular: Normal rate and regular rhythm.   Pulmonary/Chest: Effort normal and breath sounds without rales or wheezing.  Abd:  Soft, NT, ND, + BS, no organomegaly Neurological: Pt is alert. At baseline orientation, motor grossly intact Skin: Skin is warm. No rashes, other new lesions, with trace bilat LE edema Psychiatric: Pt behavior is normal without agitation  All otherwise neg per pt Lab Results  Component Value Date   WBC 6.6 10/31/2019   HGB 11.4 (L) 10/31/2019   HCT 34.1 (L) 10/31/2019   PLT 252.0 10/31/2019   GLUCOSE 269 (H) 10/31/2019   CHOL 155 05/22/2019   TRIG (H) 05/22/2019    416.0 Triglyceride is over 400; calculations on Lipids are invalid.   HDL 39.80 05/22/2019   LDLDIRECT 70.0 05/22/2019   ALT 17 10/31/2019   AST 15 10/31/2019   NA 138 10/31/2019   K 3.4 (L) 10/31/2019   CL 101 10/31/2019   CREATININE 0.77 10/31/2019   BUN 8 10/31/2019   CO2 26 10/31/2019   TSH 1.63 05/22/2019   INR 1.0 12/23/2010   HGBA1C 7.8 (A) 10/16/2019   MICROALBUR 0.9 06/12/2019      Assessment & Plan:

## 2019-12-22 ENCOUNTER — Encounter: Payer: Self-pay | Admitting: Internal Medicine

## 2019-12-22 NOTE — Assessment & Plan Note (Addendum)
stable overall by history and exam, recent data reviewed with pt, and pt to continue medical treatment as before,  to f/u any worsening symptoms or concerns  I spent 31 minutes in preparing to see the patient by review of recent labs, imaging and procedures, obtaining and reviewing separately obtained history, communicating with the patient and family or caregiver, ordering medications, tests or procedures, and documenting clinical information in the EHR including the differential Dx, treatment, and any further evaluation and other management of dm, htn, hld, bilateral knee djd with leg swelling

## 2019-12-22 NOTE — Assessment & Plan Note (Signed)
Cont f/u sport med

## 2019-12-22 NOTE — Assessment & Plan Note (Signed)
stable overall by history and exam, recent data reviewed with pt, and pt to continue medical treatment as before,  to f/u any worsening symptoms or concerns  

## 2019-12-26 ENCOUNTER — Ambulatory Visit: Payer: Medicare Other

## 2019-12-27 ENCOUNTER — Ambulatory Visit (INDEPENDENT_AMBULATORY_CARE_PROVIDER_SITE_OTHER): Payer: Medicare Other | Admitting: General Practice

## 2019-12-27 ENCOUNTER — Other Ambulatory Visit: Payer: Self-pay

## 2019-12-27 DIAGNOSIS — E538 Deficiency of other specified B group vitamins: Secondary | ICD-10-CM | POA: Diagnosis not present

## 2019-12-27 MED ORDER — CYANOCOBALAMIN 1000 MCG/ML IJ SOLN
1000.0000 ug | Freq: Once | INTRAMUSCULAR | Status: AC
  Administered 2019-12-27: 1000 ug via INTRAMUSCULAR

## 2019-12-27 NOTE — Progress Notes (Signed)
Medical screening examination/treatment/procedure(s) were performed by non-physician practitioner and as supervising physician I was immediately available for consultation/collaboration. I agree with above. Juwan Vences, MD   

## 2020-01-03 ENCOUNTER — Ambulatory Visit (INDEPENDENT_AMBULATORY_CARE_PROVIDER_SITE_OTHER): Payer: Medicare Other | Admitting: Endocrinology

## 2020-01-03 ENCOUNTER — Other Ambulatory Visit: Payer: Self-pay

## 2020-01-03 VITALS — BP 180/100 | HR 77 | Ht 61.0 in | Wt 202.0 lb

## 2020-01-03 DIAGNOSIS — E119 Type 2 diabetes mellitus without complications: Secondary | ICD-10-CM

## 2020-01-03 DIAGNOSIS — Z794 Long term (current) use of insulin: Secondary | ICD-10-CM

## 2020-01-03 LAB — POCT GLYCOSYLATED HEMOGLOBIN (HGB A1C): Hemoglobin A1C: 8.3 % — AB (ref 4.0–5.6)

## 2020-01-03 MED ORDER — INSULIN LISPRO (1 UNIT DIAL) 100 UNIT/ML (KWIKPEN): 20.0000 [IU] | PEN_INJECTOR | Freq: Three times a day (TID) | SUBCUTANEOUS | 3 refills | Status: DC

## 2020-01-03 MED ORDER — RYBELSUS 7 MG PO TABS: 7.0000 mg | ORAL_TABLET | Freq: Every day | ORAL | 3 refills | Status: DC

## 2020-01-03 MED ORDER — DEXCOM G6 SENSOR MISC: 1.0000 | 3 refills | Status: DC

## 2020-01-03 MED ORDER — DEXCOM G6 RECEIVER DEVI: 1.0000 | Freq: Once | 1 refills | Status: AC

## 2020-01-03 MED ORDER — DEXCOM G6 TRANSMITTER MISC: 1.0000 | Freq: Once | 1 refills | Status: AC

## 2020-01-03 NOTE — Patient Instructions (Addendum)
Your blood pressure is high today.  Please see your primary care provider soon, to have it rechecked. I have sent a prescription to your pharmacy, to increase Rybelsus to 7 mg daily and: Please increase the Humalog to 20 units 3 times a day (just before each meal), and: check your blood sugar twice a day.  vary the time of day when you check, between before the 3 meals, and at bedtime.  also check if you have symptoms of your blood sugar being too high or too low.  please keep a record of the readings and bring it to your next appointment here (or you can bring the meter itself).  You can write it on any piece of paper.  please call us sooner if your blood sugar goes below 70, or if you have a lot of readings over 200. I have sent a prescription to your pharmacy, for the continuous glucose monitor, as you now take insulin 3 times a day (just before each meal) Please come back for a follow-up appointment in 2 months.

## 2020-01-03 NOTE — Progress Notes (Signed)
Subjective:    Patient ID: Courtney [name], female    DOB: [birth year], 44 y.o.   MRN: [id number]  HPI Pt returns for f/u of diabetes mellitus: DM type: Insulin-requiring type 2 Dx'ed: 2016, when she presented with nonketotic hyperosmolar hyperglycemic state, but not DKA.   Complications: none Therapy: insulin since 2018, and 2 oral meds.  GDM: never. DKA: never. Severe hypoglycemia: never.  Pancreatitis: never.   Other: she takes multiple daily injections; metformin dosage is limited by nausea (which also precludes GLP rx).  She declines weight loss surgery; she did not tolerate Invokana (vaginitis); she did not qualify for continuous glucose monitor.    Interval history: Pt says she does not miss the insulin.  no cbg record, but states cbg's vary from 188-414.  No recent nausea.  Pt says she now eats 3 meals/day.  Past Medical History:  Diagnosis Date  . ALLERGIC RHINITIS 03/13/2007  . Allergy   . Anemia, B12 deficiency   . Anxiety   . C. difficile colitis   . CARPAL TUNNEL SYNDROME, BILATERAL 01/13/2010  . Cervical lesion 07/13/2011   Spine lesion - indeterminate, for f/u MRI may 2013 per neurology  . Chronic fatigue   . CTS (carpal tunnel syndrome)   . Diabetes mellitus without complication ([place])   . ESSENTIAL HYPERTENSION 03/13/2007  . Fibromyalgia   . GERD (gastroesophageal reflux disease)   . Heart murmur   . Hx of adenomatous polyp of colon 06/09/2009  . Hyperlipidemia   . HYPERLIPIDEMIA 03/13/2007  . Irritable bowel syndrome 05/24/2010  . Multiple sclerosis ([place])   . OBSTRUCTIVE SLEEP APNEA 12/08/2008  . Sleep apnea    uses c pap  . VITAMIN D DEFICIENCY 01/13/2010    Past Surgical History:  Procedure Laterality Date  . APPENDECTOMY  07/23/12  . CARPAL TUNNEL RELEASE  01/2010   bilateral  . COLONOSCOPY W/ BIOPSIES AND POLYPECTOMY  05/2009   2 tubular adenomas  . LAPAROSCOPIC APPENDECTOMY N/A 07/23/2012   Procedure: APPENDECTOMY LAPAROSCOPIC;  Surgeon: Gayland Curry,  MD;  Location: [facility] OR;  Service: General;  Laterality: N/A;    Social History   Socioeconomic History  . Marital status: Single    Spouse name: Not on file  . Number of children: 2  . Years of education: Not on file  . Highest education level: Not on file  Occupational History  . 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 Smoking status: Never Smoker  . Smokeless tobacco: Never Used  Vaping Use  . Vaping Use: Never used  Substance and Sexual Activity  . Alcohol use: No    Alcohol/week: 0.0 standard drinks  . Drug use: No  . Sexual activity: Not on file  Other Topics Concern  . Not on file  Social History Narrative   Daily Caffeine   Right handed   Two story home   Social Determinants of Health   Financial Resource Strain:   . Difficulty of Paying Living Expenses: Not on file  Food Insecurity:   . Worried About Charity fundraiser in the Last Year: Not on file  . Ran Out of Food in the Last Year: Not on file  Transportation Needs:   . Lack of Transportation (Medical): Not on file  . Lack of Transportation (Non-Medical): Not on file  Physical Activity:   . Days of Exercise per Week: Not on file  . Minutes of Exercise per Session: Not on file  Stress:   . Feeling of Stress : Not on file  Social Connections:   . Frequency of Communication with Friends and Family: Not on file  . Frequency of Social Gatherings with Friends and Family: Not on file  . Attends Religious Services: Not on file  . Active Member of Clubs or Organizations: Not on file  . Attends Archivist Meetings: Not on file  . Marital Status: Not on file  Intimate Partner Violence:   . Fear of Current or Ex-Partner: Not on file  . Emotionally Abused: Not on file  . Physically Abused: Not on file  . Sexually Abused: Not on file    Current Outpatient Medications on File Prior to Visit  Medication Sig Dispense Refill  . atorvastatin (LIPITOR) 20 MG tablet TAKE  1 TABLET DAILY 90 tablet 3  . AUBAGIO 14 MG TABS TAKE 1 TABLET DAILY 90 tablet 3  . B-D UF III MINI PEN NEEDLES 31G X 5 MM MISC USE TO INJECT HUMALOG KWIKPEN INTO THE SKIN THREE TIMES A DAY 90 each 0  . baclofen (LIORESAL) 10 MG tablet TAKE 1 TABLET THREE TIMES A DAY 270 tablet 3  . cetirizine (ZYRTEC) 10 MG tablet Take 10 mg by mouth daily.      . Cholecalciferol (VITAMIN D) 2000 UNITS CAPS Take 2 capsules by mouth every morning. Reported on 06/05/2015    . clotrimazole (MYCELEX) 10 MG troche Take 1 tablet (10 mg total) by mouth 5 (five) times daily. 35 tablet 2  . cyanocobalamin (,VITAMIN B-12,) 1000 MCG/ML injection Inject into the muscle every 30 (thirty) days.    Marland Kitchen dalfampridine (AMPYRA) 10 MG TB12 Take 1 tablet (10 mg total) by mouth 2 (two) times daily. 180 tablet 3  . furosemide (LASIX) 40 MG tablet TAKE 1 TABLET DAILY 90 tablet 3  . gabapentin (NEURONTIN) 300 MG capsule TAKE 2 CAPSULES IN THE MORNING, 1 CAPSULE IN THE AFTERNOON AND 2 CAPSULES IN THE EVENING 450 capsule 3  . glucose blood (FREESTYLE TEST STRIPS) test strip 1 each by Other route 2 (two) times daily. And lancets, 2 times daily. 180 each 3  . glucose monitoring kit (FREESTYLE) monitoring kit 1 each by Does not apply route as needed for other. Dispense any model that is covered- dispense testing supplies for Q AC/ HS accuchecks- 1 month supply with one refil. 1 each 1  . Lancets (FREESTYLE) lancets Use to check blood sugar two time daily. 150 each 2  . loperamide (IMODIUM) 2 MG capsule Take 2 mg by mouth as needed for diarrhea or loose stools.    . medroxyPROGESTERone (DEPO-PROVERA) 150 MG/ML injection Inject 150 mg into the muscle every 3 (three) months.      . metFORMIN (GLUCOPHAGE-XR) 500 MG 24 hr tablet Take 2 tablets (1,000 mg total) by mouth daily. 180 tablet 3  . metoprolol tartrate (LOPRESSOR) 100 MG tablet TAKE 2 TABLETS DAILY 180 tablet 3  . nortriptyline (PAMELOR) 50 MG capsule TAKE 2 CAPSULES AT BEDTIME 180 capsule 3   . omeprazole-sodium bicarbonate (ZEGERID) 40-1100 MG per capsule Take 1 capsule by mouth daily.      . potassium chloride (KLOR-CON) 10 MEQ tablet TAKE 2 TABLETS DAILY 180 tablet 3  . traMADol (ULTRAM) 50 MG tablet TAKE 1 TABLET BY MOUTH EVERY 6 HOURS AS NEEDED 20 tablet 5  . Vitamin D, Ergocalciferol, (DRISDOL) 1.25 MG (50000 UT) CAPS capsule Take 1 capsule (50,000 Units total) by mouth every 7 (seven) days. 12 capsule 0  Current Facility-Administered Medications on File Prior to Visit  Medication Dose Route Frequency Provider Last Rate Last Admin  . cyanocobalamin ((VITAMIN B-12)) injection 1,000 mcg  1,000 mcg Intramuscular Once Biagio Borg, MD      . cyanocobalamin ((VITAMIN B-12)) injection 1,000 mcg  1,000 mcg Intramuscular Q30 days Biagio Borg, MD   1,000 mcg at 10/23/19 1010    Allergies  Allergen Reactions  . Ciprofloxacin In D5w Rash  . Penicillins     REACTION: rash  . Tricor [Fenofibrate] Rash    Family History  Problem Relation Age of Onset  . Migraines Mother   . Hypertension Father   . COPD Father   . Diabetes Father   . Prostate cancer Maternal Uncle        prostate cancer  . Lung cancer Maternal Grandmother   . Stroke Maternal Grandfather   . Heart attack Maternal Grandfather   . Diabetes Other   . Hypertension Other   . Asthma Daughter   . Colon cancer Neg Hx   . Esophageal cancer Neg Hx   . Rectal cancer Neg Hx   . Stomach cancer Neg Hx     BP (!) 180/100   Pulse 77   Ht _0  (1.549 m)   Wt 202 lb (91.6 kg)   SpO2 97%   BMI 38.17 kg/m    Review of Systems She denies hypoglycemia.      Objective:   Physical Exam VITAL SIGNS:  See vs page GENERAL: no distress Pulses: dorsalis pedis intact bilat.   MSK: no deformity of the feet CV: 1+ bilat leg edema Skin:  no ulcer on the feet.  normal color and temp on the feet. Neuro: sensation is intact to touch on the feet   Lab Results  Component Value Date   HGBA1C 8.3 (A) 01/03/2020    Lab Results  Component Value Date   CREATININE 0.77 10/31/2019   BUN 8 10/31/2019   NA 138 10/31/2019   K 3.4 (L) 10/31/2019   CL 101 10/31/2019   CO2 26 10/31/2019      Assessment & Plan:  HTN: is noted today Insulin-requiring type 2 DM: uncontrolled  Patient Instructions  Your blood pressure is high today.  Please see your primary care provider soon, to have it rechecked. I have sent a prescription to your pharmacy, to increase Rybelsus to 7 mg daily and: Please increase the Humalog to 20 units 3 times a day (just before each meal), and: check your blood sugar twice a day.  vary the time of day when you check, between before the 3 meals, and at bedtime.  also check if you have symptoms of your blood sugar being too high or too low.  please keep a record of the readings and bring it to your next appointment here (or you can bring the meter itself).  You can write it on any piece of paper.  please call us sooner if your blood sugar goes below 70, or if you have a lot of readings over 200. I have sent a prescription to your pharmacy, for the continuous glucose monitor, as you now take insulin 3 times a day (just before each meal) Please come back for a follow-up appointment in 2 months.

## 2020-01-06 ENCOUNTER — Encounter: Payer: Self-pay | Admitting: Endocrinology

## 2020-01-06 ENCOUNTER — Other Ambulatory Visit: Payer: Self-pay

## 2020-01-06 DIAGNOSIS — Z794 Long term (current) use of insulin: Secondary | ICD-10-CM

## 2020-01-06 MED ORDER — DEXCOM G6 SENSOR MISC: 1.0000 | 3 refills | Status: DC

## 2020-01-13 ENCOUNTER — Encounter: Payer: Self-pay | Admitting: Internal Medicine

## 2020-01-14 MED ORDER — LOSARTAN POTASSIUM 100 MG PO TABS: 100.0000 mg | ORAL_TABLET | Freq: Every day | ORAL | 3 refills | Status: DC

## 2020-01-27 ENCOUNTER — Ambulatory Visit: Payer: Medicare Other

## 2020-02-14 ENCOUNTER — Other Ambulatory Visit: Payer: Self-pay

## 2020-02-14 ENCOUNTER — Ambulatory Visit (INDEPENDENT_AMBULATORY_CARE_PROVIDER_SITE_OTHER): Payer: Medicare Other

## 2020-02-14 DIAGNOSIS — E538 Deficiency of other specified B group vitamins: Secondary | ICD-10-CM

## 2020-02-14 DIAGNOSIS — Z23 Encounter for immunization: Secondary | ICD-10-CM

## 2020-02-14 MED ORDER — CYANOCOBALAMIN 1000 MCG/ML IJ SOLN
1000.0000 ug | Freq: Once | INTRAMUSCULAR | Status: AC
  Administered 2020-02-14: 1000 ug via INTRAMUSCULAR

## 2020-02-17 NOTE — Progress Notes (Signed)
B12 given

## 2020-03-05 ENCOUNTER — Encounter: Payer: Self-pay | Admitting: Endocrinology

## 2020-03-05 ENCOUNTER — Ambulatory Visit (INDEPENDENT_AMBULATORY_CARE_PROVIDER_SITE_OTHER): Payer: Medicare Other | Admitting: Endocrinology

## 2020-03-05 ENCOUNTER — Other Ambulatory Visit: Payer: Self-pay

## 2020-03-05 VITALS — BP 132/86 | HR 84 | Ht 61.0 in | Wt 202.0 lb

## 2020-03-05 DIAGNOSIS — Z794 Long term (current) use of insulin: Secondary | ICD-10-CM

## 2020-03-05 DIAGNOSIS — E119 Type 2 diabetes mellitus without complications: Secondary | ICD-10-CM

## 2020-03-05 LAB — POCT GLYCOSYLATED HEMOGLOBIN (HGB A1C): Hemoglobin A1C: 8 % — AB (ref 4.0–5.6)

## 2020-03-05 MED ORDER — RYBELSUS 14 MG PO TABS: 14.0000 mg | ORAL_TABLET | Freq: Every day | ORAL | 3 refills | Status: DC

## 2020-03-05 MED ORDER — INSULIN LISPRO (1 UNIT DIAL) 100 UNIT/ML (KWIKPEN)
PEN_INJECTOR | SUBCUTANEOUS | 3 refills | Status: DC
Start: 2020-03-05 — End: 2020-05-13

## 2020-03-05 NOTE — Patient Instructions (Addendum)
I have sent a prescription to your pharmacy, to increase Rybelsus to 14 mg daily and: Please increase the Humalog to 18 units with breakfast and with lunch, and 26 units with supper, and: check your blood sugar twice a day.  vary the time of day when you check, between before the 3 meals, and at bedtime.  also check if you have symptoms of your blood sugar being too high or too low.  please keep a record of the readings and bring it to your next appointment here (or you can bring the meter itself).  You can write it on any piece of paper.  please call us sooner if your blood sugar goes below 70, or if you have a lot of readings over 200.  Please come back for a follow-up appointment in 2 months.

## 2020-03-05 NOTE — Progress Notes (Signed)
Subjective:    Patient ID: Courtney [name], female    DOB: 14-Jul-1975, 44 y.o.   MRN: [id number]  HPI Pt returns for f/u of diabetes mellitus: DM type: Insulin-requiring type 2 Dx'ed: 2016, when she presented with nonketotic hyperosmolar hyperglycemic state, but not DKA.   Complications: none Therapy: insulin since 2018, and 2 oral meds.  GDM: never. DKA: never. Severe hypoglycemia: never.  Pancreatitis: never.   Other: she takes multiple daily injections; metformin dosage is limited by nausea (which also precludes GLP rx).  She declines weight loss surgery; she did not tolerate Invokana (vaginitis); she did not qualify for continuous glucose monitor.    Interval history: she says cbg's vary from 130-270.  It is in general highest fasting, but she does not check at HS.  No recent steroids.  She says she never misses the insulin.  Past Medical History:  Diagnosis Date  . ALLERGIC RHINITIS 03/13/2007  . Allergy   . Anemia, B12 deficiency   . Anxiety   . C. difficile colitis   . CARPAL TUNNEL SYNDROME, BILATERAL 01/13/2010  . Cervical lesion 07/13/2011   Spine lesion - indeterminate, for f/u MRI may 2013 per neurology  . Chronic fatigue   . CTS (carpal tunnel syndrome)   . Diabetes mellitus without complication ([place])   . ESSENTIAL HYPERTENSION 03/13/2007  . Fibromyalgia   . GERD (gastroesophageal reflux disease)   . Heart murmur   . Hx of adenomatous polyp of colon 06/09/2009  . Hyperlipidemia   . HYPERLIPIDEMIA 03/13/2007  . Irritable bowel syndrome 05/24/2010  . Multiple sclerosis ([place])   . OBSTRUCTIVE SLEEP APNEA 12/08/2008  . Sleep apnea    uses c pap  . VITAMIN D DEFICIENCY 01/13/2010    Past Surgical History:  Procedure Laterality Date  . APPENDECTOMY  07/23/12  . CARPAL TUNNEL RELEASE  01/2010   bilateral  . COLONOSCOPY W/ BIOPSIES AND POLYPECTOMY  05/2009   2 tubular adenomas  . LAPAROSCOPIC APPENDECTOMY N/A 07/23/2012   Procedure: APPENDECTOMY LAPAROSCOPIC;  Surgeon:  Gayland Curry, MD;  Location: [facility] OR;  Service: General;  Laterality: N/A;    Social History   Socioeconomic History  . Marital status: Single    Spouse name: Not on file  . Number of children: 2  . Years of education: Not on file  . Highest education level: Not on file  Occupational History  . 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 Smoking status: Never Smoker  . Smokeless tobacco: Never Used  Vaping Use  . Vaping Use: Never used  Substance and Sexual Activity  . Alcohol use: No    Alcohol/week: 0.0 standard drinks  . Drug use: No  . Sexual activity: Not on file  Other Topics Concern  . Not on file  Social History Narrative   Daily Caffeine   Right handed   Two story home   Social Determinants of Health   Financial Resource Strain:   . Difficulty of Paying Living Expenses: Not on file  Food Insecurity:   . Worried About Charity fundraiser in the Last Year: Not on file  . Ran Out of Food in the Last Year: Not on file  Transportation Needs:   . Lack of Transportation (Medical): Not on file  . Lack of Transportation (Non-Medical): Not on file  Physical Activity:   . Days of Exercise per Week: Not on file  . Minutes of Exercise per Session: Not  on file  Stress:   . Feeling of Stress : Not on file  Social Connections:   . Frequency of Communication with Friends and Family: Not on file  . Frequency of Social Gatherings with Friends and Family: Not on file  . Attends Religious Services: Not on file  . Active Member of Clubs or Organizations: Not on file  . Attends Archivist Meetings: Not on file  . Marital Status: Not on file  Intimate Partner Violence:   . Fear of Current or Ex-Partner: Not on file  . Emotionally Abused: Not on file  . Physically Abused: Not on file  . Sexually Abused: Not on file    Current Outpatient Medications on File Prior to Visit  Medication Sig Dispense Refill  . atorvastatin (LIPITOR) 20  MG tablet TAKE 1 TABLET DAILY 90 tablet 3  . AUBAGIO 14 MG TABS TAKE 1 TABLET DAILY 90 tablet 3  . B-D UF III MINI PEN NEEDLES 31G X 5 MM MISC USE TO INJECT HUMALOG KWIKPEN INTO THE SKIN THREE TIMES A DAY 90 each 0  . baclofen (LIORESAL) 10 MG tablet TAKE 1 TABLET THREE TIMES A DAY 270 tablet 3  . cetirizine (ZYRTEC) 10 MG tablet Take 10 mg by mouth daily.      . Cholecalciferol (VITAMIN D) 2000 UNITS CAPS Take 2 capsules by mouth every morning. Reported on 06/05/2015    . clotrimazole (MYCELEX) 10 MG troche Take 1 tablet (10 mg total) by mouth 5 (five) times daily. 35 tablet 2  . Continuous Blood Gluc Sensor (DEXCOM G6 SENSOR) MISC 1 Device by Does not apply route See admin instructions. 9 each 3  . cyanocobalamin (,VITAMIN B-12,) 1000 MCG/ML injection Inject into the muscle every 30 (thirty) days.    . furosemide (LASIX) 40 MG tablet TAKE 1 TABLET DAILY 90 tablet 3  . gabapentin (NEURONTIN) 300 MG capsule TAKE 2 CAPSULES IN THE MORNING, 1 CAPSULE IN THE AFTERNOON AND 2 CAPSULES IN THE EVENING 450 capsule 3  . glucose blood (FREESTYLE TEST STRIPS) test strip 1 each by Other route 2 (two) times daily. And lancets, 2 times daily. 180 each 3  . glucose monitoring kit (FREESTYLE) monitoring kit 1 each by Does not apply route as needed for other. Dispense any model that is covered- dispense testing supplies for Q AC/ HS accuchecks- 1 month supply with one refil. 1 each 1  . Lancets (FREESTYLE) lancets Use to check blood sugar two time daily. 150 each 2  . losartan (COZAAR) 100 MG tablet Take 1 tablet (100 mg total) by mouth daily. 90 tablet 3  . medroxyPROGESTERone (DEPO-PROVERA) 150 MG/ML injection Inject 150 mg into the muscle every 3 (three) months.      . metFORMIN (GLUCOPHAGE-XR) 500 MG 24 hr tablet Take 2 tablets (1,000 mg total) by mouth daily. 180 tablet 3  . metoprolol tartrate (LOPRESSOR) 100 MG tablet TAKE 2 TABLETS DAILY 180 tablet 3  . nortriptyline (PAMELOR) 50 MG capsule TAKE 2 CAPSULES  AT BEDTIME 180 capsule 3  . omeprazole-sodium bicarbonate (ZEGERID) 40-1100 MG per capsule Take 1 capsule by mouth daily.      . potassium chloride (KLOR-CON) 10 MEQ tablet TAKE 2 TABLETS DAILY 180 tablet 3  . Vitamin D, Ergocalciferol, (DRISDOL) 1.25 MG (50000 UT) CAPS capsule Take 1 capsule (50,000 Units total) by mouth every 7 (seven) days. 12 capsule 0  . dalfampridine (AMPYRA) 10 MG TB12 Take 1 tablet (10 mg total) by mouth 2 (two) times  daily. 180 tablet 3  . loperamide (IMODIUM) 2 MG capsule Take 2 mg by mouth as needed for diarrhea or loose stools. (Patient not taking: Reported on 03/05/2020)    . traMADol (ULTRAM) 50 MG tablet TAKE 1 TABLET BY MOUTH EVERY 6 HOURS AS NEEDED (Patient not taking: Reported on 03/05/2020) 20 tablet 5   Current Facility-Administered Medications on File Prior to Visit  Medication Dose Route Frequency Provider Last Rate Last Admin  . cyanocobalamin ((VITAMIN B-12)) injection 1,000 mcg  1,000 mcg Intramuscular Once Biagio Borg, MD      . cyanocobalamin ((VITAMIN B-12)) injection 1,000 mcg  1,000 mcg Intramuscular Q30 days Biagio Borg, MD   1,000 mcg at 10/23/19 1010    Allergies  Allergen Reactions  . Ciprofloxacin In D5w Rash  . Penicillins     REACTION: rash  . Tricor [Fenofibrate] Rash    Family History  Problem Relation Age of Onset  . Migraines Mother   . Hypertension Father   . COPD Father   . Diabetes Father   . Prostate cancer Maternal Uncle        prostate cancer  . Lung cancer Maternal Grandmother   . Stroke Maternal Grandfather   . Heart attack Maternal Grandfather   . Diabetes Other   . Hypertension Other   . Asthma Daughter   . Colon cancer Neg Hx   . Esophageal cancer Neg Hx   . Rectal cancer Neg Hx   . Stomach cancer Neg Hx     BP 132/86   Pulse 84   Ht '5\' 1"'  (1.549 m)   Wt 202 lb (91.6 kg)   SpO2 96%   BMI 38.17 kg/m     Review of Systems Denies nausea and hypoglycemia    Objective:   Physical Exam VITAL  SIGNS:  See vs page GENERAL: no distress Pulses: dorsalis pedis intact bilat.   MSK: no deformity of the feet CV: 1+ bilat leg edema Skin:  no ulcer on the feet.  normal color and temp on the feet. Neuro: sensation is intact to touch on the feet.    Lab Results  Component Value Date   HGBA1C 8.0 (A) 03/05/2020        Assessment & Plan:  Insulin-requiring type 2 DM: uncontrolled.   Patient Instructions  I have sent a prescription to your pharmacy, to increase Rybelsus to 14 mg daily and: Please increase the Humalog to 18 units with breakfast and with lunch, and 26 units with supper, and: check your blood sugar twice a day.  vary the time of day when you check, between before the 3 meals, and at bedtime.  also check if you have symptoms of your blood sugar being too high or too low.  please keep a record of the readings and bring it to your next appointment here (or you can bring the meter itself).  You can write it on any piece of paper.  please call us sooner if your blood sugar goes below 70, or if you have a lot of readings over 200.  Please come back for a follow-up appointment in 2 months.

## 2020-03-16 ENCOUNTER — Ambulatory Visit (INDEPENDENT_AMBULATORY_CARE_PROVIDER_SITE_OTHER): Payer: Medicare Other

## 2020-03-16 ENCOUNTER — Other Ambulatory Visit: Payer: Self-pay

## 2020-03-16 DIAGNOSIS — Z309 Encounter for contraceptive management, unspecified: Secondary | ICD-10-CM

## 2020-03-16 DIAGNOSIS — E538 Deficiency of other specified B group vitamins: Secondary | ICD-10-CM

## 2020-03-16 LAB — POCT URINE PREGNANCY: Preg Test, Ur: NEGATIVE

## 2020-03-16 MED ORDER — MEDROXYPROGESTERONE ACETATE 150 MG/ML IM SUSY
150.0000 mg | PREFILLED_SYRINGE | Freq: Once | INTRAMUSCULAR | Status: AC
  Administered 2020-03-16: 150 mg via INTRAMUSCULAR

## 2020-03-16 MED ORDER — CYANOCOBALAMIN 1000 MCG/ML IJ SOLN
1000.0000 ug | Freq: Once | INTRAMUSCULAR | Status: AC
  Administered 2020-03-16: 1000 ug via INTRAMUSCULAR

## 2020-03-16 NOTE — Progress Notes (Signed)
Per orders of Dr. Cathlean Cower, injection of B12 and Medroxyprogesterone was given by Clayborne Dana, [place]. Patient tolerated both injections well.

## 2020-04-15 ENCOUNTER — Ambulatory Visit: Payer: Medicare Other

## 2020-04-20 ENCOUNTER — Ambulatory Visit: Payer: Medicare Other

## 2020-05-01 NOTE — Progress Notes (Signed)
Due to the COVID-19 crisis, this telephone visit was done via telephone from my office and it was initiated and consent given by this patient and or family.  Telephone (Audio) Visit The purpose of this telephone visit is to provide medical care while limiting exposure to the novel coronavirus.    Consent was obtained for telephone visit and initiated by pt/family:  Yes Answered questions that patient had about telehealth interaction:  Yes I discussed the limitations, risks, security and privacy concerns of performing an evaluation and management service by telephone. I also discussed with the patient that there may be a patient responsible charge related to this service. The patient expressed understanding and agreed to proceed.  Pt location: Home Physician Location: office Name of referring provider:  Biagio Borg, MD I connected with [name] at patients initiation/request on 05/04/2020 at 10:10 AM EST by telephone and verified that I am speaking with the correct person using two identifiers.  Pt MRN:  [id number] Pt DOB:  11-Nov-1975  Assessment/Plan:   Multiple sclerosis She described the "MS hug" last week, which is not a symptom of active flare up.  The visual disturbance that she experienced is more consistent with ocular migraine.  1.  DMT:  Augagio 2.  Gabapentin 600mg /300mg /600mg , baclofen, nortriptyline 100mg  at bedtime, tramadol (as needed sparingly - patient aware of serotonin syndrome) for pain 3.  Ampyra 4.D3 10,000 IU daily 5. Check CBC with diff, CMP and vit D 6.  Follow up 6 months.  Need for in person visit now:  No  Subjective:  Courtney [name] is a 45 year old right-handed Caucasian woman who follows up for multiple sclerosis.  UPDATE: Current DMT: Aubagio Other current medications: Ampyra, baclofen 10 mg, gabapentin600mg /300mg /600mg , D310000 IU daily, B121000 mcg injection every 30 days, nortriptyline100 mg, tramadol 50 mg (sparingly)  10/31/2019  LABS:  CBC with WBC 6.6, HGB 11.4, HCT 34.1, PLT 252, ALC 1.5; CMP with Na 138, K 3.4, Cl 101, CO2 26, glucose 269, BUN 8, Cr 0.77, t bili 0.5, ALP 64, AST 15, ALT 17; vit D 44.37.  We increased D3 to 10,000 IU daily.     Vision:Last week she had episode of a jagged line or lines or spots in vision of both eyes lasting 5 minutes at a time and occurring throughout the day everyday last week.  No headache or eye pain. She has a history of migraine.   Motor:notes residual right upper and lower extremity weakness Sensory:may get facial numbness with heat and stress.  Pain:Last week she had a pressure wrapped around her mid torso.  Occasional pain and tightness in her lower extremities. Gait:okay. Bowel/Bladder:No change Fatigue:No Cognition:Sometimes she has trouble remembering new information Mood:Significant stress.  Her mother is very ill.  She is currently in the hospital.  HISTORY: Onset of symptoms and diagnosis occurred in 2006. She experienced an episode of numbness in her right arm and both lower extremities. She was dropping objects. MRI of brain reportedly showed "solitary right mid-to-posterior frontal periventricular white matter lesion". MRI of spine revealed no cord demyelination. CSF analysis from lumbar puncture reportedly revealed oligoclonal bands and an elevated IgG index. She was diagnosed with clinically isolated syndrome but was officially diagnosed with multiple sclerosis a few months later when she developed a relapse with left hand numbness. In February 2013, she developed left arm pain and swelling. MRI of cervical spine revealed an enhancing spinal cord lesion at C4 on the right. She was treated with IV Solumedrol.  In 2014, she had an episode of optic neuritis in her right eye, treated with IV Solumedrol. In September-October 2019, she reported to 4 weeks history of tingling in her face, which may occur off and on for 10 minutes 3 times a week. She was  prescribed a prednisone taper. Compared to prior MRI from 02/07/2017, repeat MRI of the brain and cervical spine with and without contrast from 02/19/2018, whichwere personally reviewed,demonstrated mildly progressed bifrontal cerebral white matter changes as well as probable additional abnormal signal in the left cord at the C6-7 level but no abnormal enhancement to suggestactivedemyelinating disease.Questionable if paresthesias were secondary to B12 deficiency as well.  There is no family history of MS.  Past disease modifying therapy: Avonex (difficulty injecting), Rebif (painful injections), Tecfidera (GI side effects)  Imaging: 06/03/11: MRI brain with and without contrast: single right frontal periventricular white matter lesion with high T2 FLAIR signal 06/03/11: MRI cervical spine with and without contrast: abnormal rim enhancing lesion in the spinal cord at the level of C4 on the right which mildly expands the cord. 01/21/12: MRI brain with and without contrast: unchanged nonspecific right periventricular white matter lesion 01/21/12: MRI cervical spine with and without contrast: stable nonenhancing cord lesion at C4 11/14/12: MRI brain with and without contrast. No change 11/14/12: MRI cervical spine with and without contrast: No change 12/02/13: MRI brain with and without contrast: 1. No substantial change in the right frontal juxtacortical, right corona radiata, and left periatrial white matter T2/FLAIR hyperintensities. While nonspecific, these likely reflect demyelinating lesions in this patient with a history of multiple sclerosis and concomitant cervical cord lesion. No new lesions are identified and none of the existing lesions enhance or restrict diffusion. 2. Subcentimeter left parafalcine dural based lesion as described above has slowly enlarged over time and demonstrates equivocal enhancement, but may represent a meningioma.Recommend attention on follow up  imaging. 12/02/13: MRI cervical spine with and without contrast: No change 12/15/14: MRI brain with and without contrast: 1.Similar appearance of focal T2/FLAIR hyperintense white matter lesion within the right lateral body of the corpus callosum without associated enhancement to suggest active demyelination. 2.No new or enlarging white matter lesions identified. 3.Slowly enlarging nonenhancing nodular lesion along the left anterior aspect of the falx cerebri, which may reflect a small meningioma. 12/15/14: MRI cervical spine with and without contrast: no change 10/10/15: MRI brain with and without contrast: no change 10/10/15: MRI cervical spine with and without contrast: no change 02/07/17: MRI brain with and without contrast: No significant change since 12/24/2008 02/07/2017: MRI cervical spine with and without contrast: Chronic lesion at C4 level, stable since 2013 02/19/18: MRI brain with and without contrast: Compared to prior imaging from 02/07/17, mildly progressed bifrontal cerebral white matter changes, right greater than left, but no abnormal enhancement to suggest active disease. 02/19/18: MRI cervical spine with and without contrast: Again demonstrated chronic lesion at C4 level as well as probable subtle patchy cord signal abnormality within the left cord at level of C6-7 not definitely seen on prior imaging from 02/07/17 but no abnormal enhancement to suggest active disease. 10/30/2019 MRI brain and cervical spine with and without contrast:  no significant change compared to prior imaging from 2019.I   Objective:   There were no vitals filed for this visit.   Follow Up Instructions:      -I discussed the assessment and treatment plan with the patient. The patient was provided an opportunity to ask questions and all were answered. The patient agreed  with the plan and demonstrated an understanding of the instructions.   The patient was advised to call back or seek an  in-person evaluation if the symptoms worsen or if the condition fails to improve as anticipated.    Total Time spent in visit with the patient was:  14 minutes   Dudley Major, DO

## 2020-05-04 ENCOUNTER — Other Ambulatory Visit: Payer: Self-pay

## 2020-05-04 ENCOUNTER — Telehealth (INDEPENDENT_AMBULATORY_CARE_PROVIDER_SITE_OTHER): Payer: Medicare Other | Admitting: Neurology

## 2020-05-04 ENCOUNTER — Encounter: Payer: Self-pay | Admitting: Neurology

## 2020-05-04 DIAGNOSIS — G35 Multiple sclerosis: Secondary | ICD-10-CM | POA: Diagnosis not present

## 2020-05-04 MED ORDER — TRAMADOL HCL 50 MG PO TABS: 50.0000 mg | ORAL_TABLET | Freq: Four times a day (QID) | ORAL | 1 refills | Status: DC | PRN

## 2020-05-04 MED ORDER — GABAPENTIN 300 MG PO CAPS
ORAL_CAPSULE | ORAL | 1 refills | Status: DC
Start: 2020-05-04 — End: 2020-12-18

## 2020-05-04 MED ORDER — AUBAGIO 14 MG PO TABS: 1.0000 | ORAL_TABLET | Freq: Every day | ORAL | 1 refills | Status: DC

## 2020-05-04 MED ORDER — NORTRIPTYLINE HCL 50 MG PO CAPS
100.0000 mg | ORAL_CAPSULE | Freq: Every day | ORAL | 1 refills | Status: DC
Start: 2020-05-04 — End: 2020-12-18

## 2020-05-04 MED ORDER — BACLOFEN 10 MG PO TABS
10.0000 mg | ORAL_TABLET | Freq: Three times a day (TID) | ORAL | 1 refills | Status: DC
Start: 2020-05-04 — End: 2020-12-18

## 2020-05-04 MED ORDER — DALFAMPRIDINE ER 10 MG PO TB12
10.0000 mg | ORAL_TABLET | Freq: Two times a day (BID) | ORAL | 1 refills | Status: DC
Start: 2020-05-04 — End: 2020-06-01

## 2020-05-04 NOTE — Addendum Note (Signed)
Addended by: Venetia Night on: 05/04/2020 10:17 AM   Modules accepted: Orders

## 2020-05-07 ENCOUNTER — Other Ambulatory Visit (INDEPENDENT_AMBULATORY_CARE_PROVIDER_SITE_OTHER): Payer: Medicare Other

## 2020-05-07 ENCOUNTER — Other Ambulatory Visit: Payer: Self-pay

## 2020-05-07 DIAGNOSIS — G35 Multiple sclerosis: Secondary | ICD-10-CM

## 2020-05-07 LAB — CBC WITH DIFFERENTIAL/PLATELET
Basophils Absolute: 0.1 10*3/uL (ref 0.0–0.1)
Basophils Relative: 1.1 % (ref 0.0–3.0)
Eosinophils Absolute: 0.3 10*3/uL (ref 0.0–0.7)
Eosinophils Relative: 3.6 % (ref 0.0–5.0)
HCT: 35.1 % — ABNORMAL LOW (ref 36.0–46.0)
Hemoglobin: 11.5 g/dL — ABNORMAL LOW (ref 12.0–15.0)
Lymphocytes Relative: 24.4 % (ref 12.0–46.0)
Lymphs Abs: 1.7 10*3/uL (ref 0.7–4.0)
MCHC: 32.9 g/dL (ref 30.0–36.0)
MCV: 79.5 fl (ref 78.0–100.0)
Monocytes Absolute: 0.4 10*3/uL (ref 0.1–1.0)
Monocytes Relative: 5.5 % (ref 3.0–12.0)
Neutro Abs: 4.6 10*3/uL (ref 1.4–7.7)
Neutrophils Relative %: 65.4 % (ref 43.0–77.0)
Platelets: 306 10*3/uL (ref 150.0–400.0)
RBC: 4.41 Mil/uL (ref 3.87–5.11)
RDW: 15.4 % (ref 11.5–15.5)
WBC: 7 10*3/uL (ref 4.0–10.5)

## 2020-05-07 LAB — COMPREHENSIVE METABOLIC PANEL
ALT: 21 U/L (ref 0–35)
AST: 13 U/L (ref 0–37)
Albumin: 4.2 g/dL (ref 3.5–5.2)
Alkaline Phosphatase: 79 U/L (ref 39–117)
BUN: 9 mg/dL (ref 6–23)
CO2: 29 mEq/L (ref 19–32)
Calcium: 9.8 mg/dL (ref 8.4–10.5)
Chloride: 100 mEq/L (ref 96–112)
Creatinine, Ser: 0.82 mg/dL (ref 0.40–1.20)
GFR: 87.26 mL/min (ref >60.00)
Glucose, Bld: 186 mg/dL — ABNORMAL HIGH (ref 70–99)
Potassium: 3.7 mEq/L (ref 3.5–5.1)
Sodium: 136 mEq/L (ref 135–145)
Total Bilirubin: 0.4 mg/dL (ref 0.2–1.2)
Total Protein: 7 g/dL (ref 6.0–8.3)

## 2020-05-07 LAB — VITAMIN D 25 HYDROXY (VIT D DEFICIENCY, FRACTURES): VITD: 72.21 ng/mL (ref 30.00–100.00)

## 2020-05-13 ENCOUNTER — Other Ambulatory Visit: Payer: Self-pay

## 2020-05-13 ENCOUNTER — Ambulatory Visit (INDEPENDENT_AMBULATORY_CARE_PROVIDER_SITE_OTHER): Payer: Medicare Other | Admitting: Endocrinology

## 2020-05-13 VITALS — BP 182/92 | HR 88 | Ht 61.0 in | Wt 193.0 lb

## 2020-05-13 DIAGNOSIS — Z794 Long term (current) use of insulin: Secondary | ICD-10-CM | POA: Diagnosis not present

## 2020-05-13 DIAGNOSIS — E119 Type 2 diabetes mellitus without complications: Secondary | ICD-10-CM | POA: Diagnosis not present

## 2020-05-13 LAB — POCT GLYCOSYLATED HEMOGLOBIN (HGB A1C): Hemoglobin A1C: 7.4 % — AB (ref 4.0–5.6)

## 2020-05-13 MED ORDER — FREESTYLE LIBRE 2 READER DEVI: 1.0000 | Freq: Once | 1 refills | Status: AC

## 2020-05-13 MED ORDER — FREESTYLE LIBRE 2 SENSOR MISC: 1.0000 | 3 refills | Status: DC

## 2020-05-13 MED ORDER — INSULIN LISPRO (1 UNIT DIAL) 100 UNIT/ML (KWIKPEN): PEN_INJECTOR | SUBCUTANEOUS | 3 refills | Status: DC

## 2020-05-13 NOTE — Patient Instructions (Addendum)
Your blood pressure is high today.  Please see your primary care provider soon, to have it rechecked Please increase the Humalog to 20 units with breakfast, 18 units with lunch, and 14 units with supper, and: Please continue the same other medications. I have sent a prescription to [facility], for the continuous glucose monitor check your blood sugar twice a day.  vary the time of day when you check, between before the 3 meals, and at bedtime.  also check if you have symptoms of your blood sugar being too high or too low.  please keep a record of the readings and bring it to your next appointment here (or you can bring the meter itself).  You can write it on any piece of paper.  please call us sooner if your blood sugar goes below 70, or if you have a lot of readings over 200.   Please come back for a follow-up appointment in 2 months.

## 2020-05-13 NOTE — Progress Notes (Signed)
Subjective:    Patient ID: Courtney [name], female    DOB: 10-20-1975, 45 y.o.   MRN: [id number]  HPI Pt returns for f/u of diabetes mellitus: DM type: Insulin-requiring type 2 Dx'ed: 2016, when she presented with nonketotic hyperosmolar hyperglycemic state, but not DKA.   Complications: none Therapy: insulin since 2018, and 2 oral meds.  GDM: never. DKA: never. Severe hypoglycemia: never.  Pancreatitis: never.   Other: she takes multiple daily injections; metformin dosage is limited by nausea.  She declines weight loss surgery; she did not tolerate Invokana (vaginitis); she did not qualify for continuous glucose monitor.    Interval history: she brings a record of her cbg's which I have reviewed today.  cbg's vary from 116-215.  There is no trend throughout the day.  No recent steroids.  She says she never misses meds, but she takes only 12 units of Humalog with supper.  She declines pump, for now.  She denies hypoglycemia.  Past Medical History:  Diagnosis Date  . ALLERGIC RHINITIS 03/13/2007  . Allergy   . Anemia, B12 deficiency   . Anxiety   . C. difficile colitis   . CARPAL TUNNEL SYNDROME, BILATERAL 01/13/2010  . Cervical lesion 07/13/2011   Spine lesion - indeterminate, for f/u MRI may 2013 per neurology  . Chronic fatigue   . CTS (carpal tunnel syndrome)   . Diabetes mellitus without complication ([place])   . ESSENTIAL HYPERTENSION 03/13/2007  . Fibromyalgia   . GERD (gastroesophageal reflux disease)   . Heart murmur   . Hx of adenomatous polyp of colon 06/09/2009  . Hyperlipidemia   . HYPERLIPIDEMIA 03/13/2007  . Irritable bowel syndrome 05/24/2010  . Multiple sclerosis ([place])   . OBSTRUCTIVE SLEEP APNEA 12/08/2008  . Sleep apnea    uses c pap  . VITAMIN D DEFICIENCY 01/13/2010    Past Surgical History:  Procedure Laterality Date  . APPENDECTOMY  07/23/12  . CARPAL TUNNEL RELEASE  01/2010   bilateral  . COLONOSCOPY W/ BIOPSIES AND POLYPECTOMY  05/2009   2 tubular  adenomas  . LAPAROSCOPIC APPENDECTOMY N/A 07/23/2012   Procedure: APPENDECTOMY LAPAROSCOPIC;  Surgeon: Gayland Curry, MD;  Location: [facility] OR;  Service: General;  Laterality: N/A;    Social History   Socioeconomic History  . Marital status: Single    Spouse name: Not on file  . Number of children: 2  . Years of education: Not on file  . Highest education level: Not on file  Occupational History  . 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 Smoking status: Never Smoker  . Smokeless tobacco: Never Used  Vaping Use  . Vaping Use: Never used  Substance and Sexual Activity  . Alcohol use: No    Alcohol/week: 0.0 standard drinks  . Drug use: No  . Sexual activity: Not on file  Other Topics Concern  . Not on file  Social History Narrative   Daily Caffeine   Right handed   Two story home   Social Determinants of Health   Financial Resource Strain: Not on file  Food Insecurity: Not on file  Transportation Needs: Not on file  Physical Activity: Not on file  Stress: Not on file  Social Connections: Not on file  Intimate Partner Violence: Not on file    Current Outpatient Medications on File Prior to Visit  Medication Sig Dispense Refill  . atorvastatin (LIPITOR) 20 MG tablet TAKE 1 TABLET DAILY 90 tablet  3  . AUBAGIO 14 MG TABS Take 1 tablet by mouth daily. 90 tablet 1  . B-D UF III MINI PEN NEEDLES 31G X 5 MM MISC USE TO INJECT HUMALOG KWIKPEN INTO THE SKIN THREE TIMES A DAY 90 each 0  . baclofen (LIORESAL) 10 MG tablet Take 1 tablet (10 mg total) by mouth 3 (three) times daily. 270 tablet 1  . cetirizine (ZYRTEC) 10 MG tablet Take 10 mg by mouth daily.      . Cholecalciferol (VITAMIN D) 2000 UNITS CAPS Take 2 capsules by mouth every morning. Reported on 06/05/2015    . clotrimazole (MYCELEX) 10 MG troche Take 1 tablet (10 mg total) by mouth 5 (five) times daily. 35 tablet 2  . cyanocobalamin (,VITAMIN B-12,) 1000 MCG/ML injection Inject into the  muscle every 30 (thirty) days.    Marland Kitchen dalfampridine (AMPYRA) 10 MG TB12 Take 1 tablet (10 mg total) by mouth 2 (two) times daily. 180 tablet 1  . furosemide (LASIX) 40 MG tablet TAKE 1 TABLET DAILY 90 tablet 3  . gabapentin (NEURONTIN) 300 MG capsule TAKE 2 CAPSULES IN THE MORNING, 1 CAPSULE IN THE AFTERNOON AND 2 CAPSULES IN THE EVENING 450 capsule 1  . glucose blood (FREESTYLE TEST STRIPS) test strip 1 each by Other route 2 (two) times daily. And lancets, 2 times daily. 180 each 3  . glucose monitoring kit (FREESTYLE) monitoring kit 1 each by Does not apply route as needed for other. Dispense any model that is covered- dispense testing supplies for Q AC/ HS accuchecks- 1 month supply with one refil. 1 each 1  . Lancets (FREESTYLE) lancets Use to check blood sugar two time daily. 150 each 2  . loperamide (IMODIUM) 2 MG capsule Take 2 mg by mouth as needed for diarrhea or loose stools.    Marland Kitchen losartan (COZAAR) 100 MG tablet Take 1 tablet (100 mg total) by mouth daily. 90 tablet 3  . medroxyPROGESTERone (DEPO-PROVERA) 150 MG/ML injection Inject 150 mg into the muscle every 3 (three) months.      . metFORMIN (GLUCOPHAGE-XR) 500 MG 24 hr tablet Take 2 tablets (1,000 mg total) by mouth daily. 180 tablet 3  . metoprolol tartrate (LOPRESSOR) 100 MG tablet TAKE 2 TABLETS DAILY 180 tablet 3  . nortriptyline (PAMELOR) 50 MG capsule Take 2 capsules (100 mg total) by mouth at bedtime. 180 capsule 1  . omeprazole-sodium bicarbonate (ZEGERID) 40-1100 MG per capsule Take 1 capsule by mouth daily.      . potassium chloride (KLOR-CON) 10 MEQ tablet TAKE 2 TABLETS DAILY 180 tablet 3  . Semaglutide (RYBELSUS) 14 MG TABS Take 14 mg by mouth daily. 90 tablet 3  . traMADol (ULTRAM) 50 MG tablet Take 1 tablet (50 mg total) by mouth every 6 (six) hours as needed. 20 tablet 1  . Vitamin D, Ergocalciferol, (DRISDOL) 1.25 MG (50000 UT) CAPS capsule Take 1 capsule (50,000 Units total) by mouth every 7 (seven) days. 12 capsule 0    Current Facility-Administered Medications on File Prior to Visit  Medication Dose Route Frequency Provider Last Rate Last Admin  . cyanocobalamin ((VITAMIN B-12)) injection 1,000 mcg  1,000 mcg Intramuscular Once Biagio Borg, MD      . cyanocobalamin ((VITAMIN B-12)) injection 1,000 mcg  1,000 mcg Intramuscular Q30 days Biagio Borg, MD   1,000 mcg at 10/23/19 1010    Allergies  Allergen Reactions  . Ciprofloxacin In D5w Rash  . Penicillins     REACTION: rash  .  Tricor [Fenofibrate] Rash    Family History  Problem Relation Age of Onset  . Migraines Mother   . Hypertension Father   . COPD Father   . Diabetes Father   . Prostate cancer Maternal Uncle        prostate cancer  . Lung cancer Maternal Grandmother   . Stroke Maternal Grandfather   . Heart attack Maternal Grandfather   . Diabetes Other   . Hypertension Other   . Asthma Daughter   . Colon cancer Neg Hx   . Esophageal cancer Neg Hx   . Rectal cancer Neg Hx   . Stomach cancer Neg Hx     BP (!) 182/92 (BP Location: Right Arm, Patient Position: Sitting, Cuff Size: Large)   Pulse 88   Ht '5\' 1"'  (1.549 m)   Wt 193 lb (87.5 kg)   SpO2 98%   BMI 36.47 kg/m    Review of Systems Nausea is resolved.      Objective:   Physical Exam VITAL SIGNS:  See vs page GENERAL: no distress Pulses: dorsalis pedis intact bilat.   MSK: no deformity of the feet CV: 1+ bilat leg edema Skin:  no ulcer on the feet.  normal color and temp on the feet. Neuro: sensation is intact to touch on the feet.     Lab Results  Component Value Date   HGBA1C 7.4 (A) 05/13/2020       Assessment & Plan:  HTN: is noted today Insulin-requiring type 2 DM: uncontrolled.   Patient Instructions  Your blood pressure is high today.  Please see your primary care provider soon, to have it rechecked Please increase the Humalog to 20 units with breakfast, 18 units with lunch, and 14 units with supper, and: Please continue the same other  medications. I have sent a prescription to [facility], for the continuous glucose monitor check your blood sugar twice a day.  vary the time of day when you check, between before the 3 meals, and at bedtime.  also check if you have symptoms of your blood sugar being too high or too low.  please keep a record of the readings and bring it to your next appointment here (or you can bring the meter itself).  You can write it on any piece of paper.  please call us sooner if your blood sugar goes below 70, or if you have a lot of readings over 200.   Please come back for a follow-up appointment in 2 months.

## 2020-05-21 ENCOUNTER — Encounter: Payer: Self-pay | Admitting: Internal Medicine

## 2020-05-22 ENCOUNTER — Other Ambulatory Visit: Payer: Self-pay

## 2020-05-22 MED ORDER — ATORVASTATIN CALCIUM 20 MG PO TABS: 20.0000 mg | ORAL_TABLET | Freq: Every day | ORAL | 3 refills | Status: DC

## 2020-06-01 ENCOUNTER — Other Ambulatory Visit: Payer: Self-pay | Admitting: Neurology

## 2020-06-05 ENCOUNTER — Other Ambulatory Visit: Payer: Self-pay | Admitting: Neurology

## 2020-06-09 ENCOUNTER — Encounter: Payer: Self-pay | Admitting: Endocrinology

## 2020-06-11 ENCOUNTER — Other Ambulatory Visit: Payer: Self-pay

## 2020-06-11 MED ORDER — RYBELSUS 14 MG PO TABS: 14.0000 mg | ORAL_TABLET | Freq: Every day | ORAL | 3 refills | Status: DC

## 2020-06-11 MED ORDER — METFORMIN HCL ER 500 MG PO TB24: 1000.0000 mg | ORAL_TABLET | Freq: Every day | ORAL | 3 refills | Status: DC

## 2020-06-19 ENCOUNTER — Ambulatory Visit (INDEPENDENT_AMBULATORY_CARE_PROVIDER_SITE_OTHER): Payer: Medicare Other | Admitting: Internal Medicine

## 2020-06-19 ENCOUNTER — Telehealth: Payer: Self-pay

## 2020-06-19 ENCOUNTER — Encounter: Payer: Self-pay | Admitting: Internal Medicine

## 2020-06-19 ENCOUNTER — Other Ambulatory Visit: Payer: Self-pay

## 2020-06-19 VITALS — BP 122/76 | HR 87 | Ht 61.0 in | Wt 190.0 lb

## 2020-06-19 DIAGNOSIS — R1013 Epigastric pain: Secondary | ICD-10-CM | POA: Diagnosis not present

## 2020-06-19 DIAGNOSIS — Z1159 Encounter for screening for other viral diseases: Secondary | ICD-10-CM | POA: Diagnosis not present

## 2020-06-19 DIAGNOSIS — Z0000 Encounter for general adult medical examination without abnormal findings: Secondary | ICD-10-CM | POA: Diagnosis not present

## 2020-06-19 DIAGNOSIS — Z794 Long term (current) use of insulin: Secondary | ICD-10-CM | POA: Diagnosis not present

## 2020-06-19 DIAGNOSIS — G35 Multiple sclerosis: Secondary | ICD-10-CM

## 2020-06-19 DIAGNOSIS — E559 Vitamin D deficiency, unspecified: Secondary | ICD-10-CM

## 2020-06-19 DIAGNOSIS — E119 Type 2 diabetes mellitus without complications: Secondary | ICD-10-CM

## 2020-06-19 DIAGNOSIS — Z0001 Encounter for general adult medical examination with abnormal findings: Secondary | ICD-10-CM

## 2020-06-19 DIAGNOSIS — E538 Deficiency of other specified B group vitamins: Secondary | ICD-10-CM

## 2020-06-19 MED ORDER — AUBAGIO 14 MG PO TABS: 1.0000 | ORAL_TABLET | Freq: Every day | ORAL | 1 refills | Status: DC

## 2020-06-19 MED ORDER — FAMOTIDINE 20 MG PO TABS: 20.0000 mg | ORAL_TABLET | Freq: Two times a day (BID) | ORAL | 3 refills | Status: DC

## 2020-06-19 MED ORDER — CYANOCOBALAMIN 1000 MCG/ML IJ SOLN: 1000.0000 ug | Freq: Once | INTRAMUSCULAR | Status: DC

## 2020-06-19 NOTE — Patient Instructions (Signed)
Ok to add the pepcid 20 mg twice per day  You had the B12 shot today  Please continue all other medications as before, and refills have been done if requested.  Please have the pharmacy call with any other refills you may need.  Please continue your efforts at being more active, low cholesterol diet, and weight control.  You are otherwise up to date with prevention measures today.  Please keep your appointments with your specialists as you may have planned  Please make an Appointment to return in 6 months, or sooner if needed, also with Lab Appointment for testing done 3-5 days before at the [facility] (so this is for TWO appointments - please see the scheduling desk as you leave)  Due to the ongoing Covid 19 pandemic, our lab now requires an appointment for any labs done at our office.  If you need labs done and do not have an appointment, please call our office ahead of time to schedule before presenting to the lab for your testing.

## 2020-06-19 NOTE — Progress Notes (Signed)
Patient ID: Courtney [name], female   DOB: 1975-09-18, 45 y.o.   MRN: [id number]         Chief Complaint:: wellness exam and b12 deficiency, and epigastric pain       HPI:  Courtney [name] is a 45 y.o. female here for wellness exam. Plans to have eye exam soon, o/w up to date with immunizations and preventive referrals, except due for hep c screen                        Also due for montly IM b12 shots- oral b12 has not worked well.  Also c/o 3 moths intermittent epigastric pain with bloating and mild worsening reflux not always improved with anti reflux diet and ppI; Denies dysphagia, vomiting, other bowel change or blood.  Nothing else seems to make better or worse.  Pt denies chest pain, increased sob or doe, wheezing, orthopnea, PND, increased LE swelling, palpitations, dizziness or syncope.   Pt denies polydipsia, polyuria,  Pt denies fever, night sweats, loss of appetite, or other constitutional symptoms    Lost with better diet  .  Denies new focal neuro s/s.   Wt Readings from Last 3 Encounters:  06/19/20 190 lb (86.2 kg)  05/13/20 193 lb (87.5 kg)  03/05/20 202 lb (91.6 kg)   BP Readings from Last 3 Encounters:  06/19/20 122/76  05/13/20 (!) 182/92  03/05/20 132/86   Immunization History  Administered Date(s) Administered  . Influenza Split 01/14/2011, 01/09/2012  . Influenza Whole 03/13/2007, 02/07/2008, 02/27/2009, 01/13/2010  . Influenza,inj,Quad PF,6+ Mos 01/09/2013, 02/07/2014, 02/13/2015, 02/17/2016, 01/13/2017, 01/03/2018, 01/17/2019, 02/14/2020  . PFIZER Comirnaty(Gray Top)Covid-19 Tri-Sucrose Vaccine 04/28/2020  . PFIZER(Purple Top)SARS-COV-2 Vaccination 07/12/2019, 08/05/2019  . Pneumococcal Polysaccharide-23 02/13/2015  . Td 04/18/2004  . Tdap 04/22/2014   Health Maintenance Due  Topic Date Due  . Hepatitis C Screening  Never done  . OPHTHALMOLOGY EXAM  01/10/2018      Past Medical History:  Diagnosis Date  . ALLERGIC RHINITIS 03/13/2007  . Allergy   . Anemia,  B12 deficiency   . Anxiety   . C. difficile colitis   . CARPAL TUNNEL SYNDROME, BILATERAL 01/13/2010  . Cervical lesion 07/13/2011   Spine lesion - indeterminate, for f/u MRI may 2013 per neurology  . Chronic fatigue   . CTS (carpal tunnel syndrome)   . Diabetes mellitus without complication ([place])   . ESSENTIAL HYPERTENSION 03/13/2007  . Fibromyalgia   . GERD (gastroesophageal reflux disease)   . Heart murmur   . Hx of adenomatous polyp of colon 06/09/2009  . Hyperlipidemia   . HYPERLIPIDEMIA 03/13/2007  . Irritable bowel syndrome 05/24/2010  . Multiple sclerosis ([place])   . OBSTRUCTIVE SLEEP APNEA 12/08/2008  . Sleep apnea    uses c pap  . VITAMIN D DEFICIENCY 01/13/2010   Past Surgical History:  Procedure Laterality Date  . APPENDECTOMY  07/23/12  . CARPAL TUNNEL RELEASE  01/2010   bilateral  . COLONOSCOPY W/ BIOPSIES AND POLYPECTOMY  05/2009   2 tubular adenomas  . LAPAROSCOPIC APPENDECTOMY N/A 07/23/2012   Procedure: APPENDECTOMY LAPAROSCOPIC;  Surgeon: Gayland Curry, MD;  Location: [facility];  Service: General;  Laterality: N/A;    reports that she has never smoked. She has never used smokeless tobacco. She reports that she does not drink alcohol and does not use drugs. family history includes Asthma in her daughter; COPD in her father; Diabetes in her father and another family member;  Heart attack in her maternal grandfather; Hypertension in her father and another family member; Lung cancer in her maternal grandmother; Migraines in her mother; Prostate cancer in her maternal uncle; Stroke in her maternal grandfather. Allergies  Allergen Reactions  . Ciprofloxacin In D5w Rash  . Penicillins     REACTION: rash  . Tricor [Fenofibrate] Rash   Current Outpatient Medications on File Prior to Visit  Medication Sig Dispense Refill  . atorvastatin (LIPITOR) 20 MG tablet Take 1 tablet (20 mg total) by mouth daily. 90 tablet 3  . B-D UF III MINI PEN NEEDLES 31G X 5 MM MISC USE TO INJECT HUMALOG  KWIKPEN INTO THE SKIN THREE TIMES A DAY 90 each 0  . baclofen (LIORESAL) 10 MG tablet Take 1 tablet (10 mg total) by mouth 3 (three) times daily. 270 tablet 1  . cetirizine (ZYRTEC) 10 MG tablet Take 10 mg by mouth daily.      . Cholecalciferol (VITAMIN D) 2000 UNITS CAPS Take 2 capsules by mouth every morning. Reported on 06/05/2015    . clotrimazole (MYCELEX) 10 MG troche Take 1 tablet (10 mg total) by mouth 5 (five) times daily. 35 tablet 2  . Continuous Blood Gluc Sensor (FREESTYLE LIBRE 2 SENSOR) MISC 1 Device by Does not apply route every 14 (fourteen) days. 6 each 3  . cyanocobalamin (,VITAMIN B-12,) 1000 MCG/ML injection Inject into the muscle every 30 (thirty) days.    Marland Kitchen dalfampridine 10 MG TB12 TAKE 1 TABLET TWICE A DAY 180 tablet 0  . furosemide (LASIX) 40 MG tablet TAKE 1 TABLET DAILY 90 tablet 3  . gabapentin (NEURONTIN) 300 MG capsule TAKE 2 CAPSULES IN THE MORNING, 1 CAPSULE IN THE AFTERNOON AND 2 CAPSULES IN THE EVENING 450 capsule 1  . glucose blood (FREESTYLE TEST STRIPS) test strip 1 each by Other route 2 (two) times daily. And lancets, 2 times daily. 180 each 3  . glucose monitoring kit (FREESTYLE) monitoring kit 1 each by Does not apply route as needed for other. Dispense any model that is covered- dispense testing supplies for Q AC/ HS accuchecks- 1 month supply with one refil. 1 each 1  . insulin lispro (HUMALOG KWIKPEN) 100 UNIT/ML KwikPen 3 times a day (just before each meal), 20-18-14 and pen needles 3/day. 60 mL 3  . Lancets (FREESTYLE) lancets Use to check blood sugar two time daily. 150 each 2  . loperamide (IMODIUM) 2 MG capsule Take 2 mg by mouth as needed for diarrhea or loose stools.    Marland Kitchen losartan (COZAAR) 100 MG tablet Take 1 tablet (100 mg total) by mouth daily. 90 tablet 3  . medroxyPROGESTERone (DEPO-PROVERA) 150 MG/ML injection Inject 150 mg into the muscle every 3 (three) months.      . metFORMIN (GLUCOPHAGE-XR) 500 MG 24 hr tablet Take 2 tablets (1,000 mg  total) by mouth daily. 180 tablet 3  . metoprolol tartrate (LOPRESSOR) 100 MG tablet TAKE 2 TABLETS DAILY 180 tablet 3  . nortriptyline (PAMELOR) 50 MG capsule Take 2 capsules (100 mg total) by mouth at bedtime. 180 capsule 1  . omeprazole-sodium bicarbonate (ZEGERID) 40-1100 MG per capsule Take 1 capsule by mouth daily.      . potassium chloride (KLOR-CON) 10 MEQ tablet TAKE 2 TABLETS DAILY 180 tablet 3  . Semaglutide (RYBELSUS) 14 MG TABS Take 14 mg by mouth daily. 90 tablet 3  . traMADol (ULTRAM) 50 MG tablet Take 1 tablet (50 mg total) by mouth every 6 (six) hours as needed.  20 tablet 1  . Vitamin D, Ergocalciferol, (DRISDOL) 1.25 MG (50000 UT) CAPS capsule Take 1 capsule (50,000 Units total) by mouth every 7 (seven) days. 12 capsule 0   Current Facility-Administered Medications on File Prior to Visit  Medication Dose Route Frequency Provider Last Rate Last Admin  . cyanocobalamin ((VITAMIN B-12)) injection 1,000 mcg  1,000 mcg Intramuscular Once Biagio Borg, MD      . cyanocobalamin ((VITAMIN B-12)) injection 1,000 mcg  1,000 mcg Intramuscular Q30 days Biagio Borg, MD   1,000 mcg at 06/19/20 1157        ROS:  All others reviewed and negative.  Objective        PE:  BP 122/76   Pulse 87   Ht '5\' 1"'  (1.549 m)   Wt 190 lb (86.2 kg)   SpO2 97%   BMI 35.90 kg/m                 Constitutional: Pt appears in NAD               HENT: Head: NCAT.                Right Ear: External ear normal.                 Left Ear: External ear normal.                Eyes: . Pupils are equal, round, and reactive to light. Conjunctivae and EOM are normal               Nose: without d/c or deformity               Neck: Neck supple. Gross normal ROM               Cardiovascular: Normal rate and regular rhythm.                 Pulmonary/Chest: Effort normal and breath sounds without rales or wheezing.                Abd:  Soft, NT, ND, + BS, no organomegaly               Neurological: Pt is alert. At  baseline orientation, motor grossly intact               Skin: Skin is warm. No rashes, no other new lesions, LE edema - none               Psychiatric: Pt behavior is normal without agitation   Micro: none  Cardiac tracings I have personally interpreted today:  none  Pertinent Radiological findings (summarize): none   Lab Results  Component Value Date   WBC 7.0 05/07/2020   HGB 11.5 (L) 05/07/2020   HCT 35.1 (L) 05/07/2020   PLT 306.0 05/07/2020   GLUCOSE 186 (H) 05/07/2020   CHOL 155 05/22/2019   TRIG (H) 05/22/2019    416.0 Triglyceride is over 400; calculations on Lipids are invalid.   HDL 39.80 05/22/2019   LDLDIRECT 70.0 05/22/2019   ALT 21 05/07/2020   AST 13 05/07/2020   NA 136 05/07/2020   K 3.7 05/07/2020   CL 100 05/07/2020   CREATININE 0.82 05/07/2020   BUN 9 05/07/2020   CO2 29 05/07/2020   TSH 1.63 05/22/2019   INR 1.0 12/23/2010   HGBA1C 7.4 (A) 05/13/2020   MICROALBUR 0.9 06/12/2019   Assessment/Plan:  KINLEE GARRISON is  a 45 y.o. White or Caucasian [1] female with  has a past medical history of ALLERGIC RHINITIS (03/13/2007), Allergy, Anemia, B12 deficiency, Anxiety, C. difficile colitis, CARPAL TUNNEL SYNDROME, BILATERAL (01/13/2010), Cervical lesion (07/13/2011), Chronic fatigue, CTS (carpal tunnel syndrome), Diabetes mellitus without complication ([place]), ESSENTIAL HYPERTENSION (03/13/2007), Fibromyalgia, GERD (gastroesophageal reflux disease), Heart murmur, adenomatous polyp of colon (06/09/2009), Hyperlipidemia, HYPERLIPIDEMIA (03/13/2007), Irritable bowel syndrome (05/24/2010), Multiple sclerosis ([place]), OBSTRUCTIVE SLEEP APNEA (12/08/2008), Sleep apnea, and VITAMIN D DEFICIENCY (01/13/2010).  Encounter for well adult exam with abnormal findings Age and sex appropriate education and counseling updated with regular exercise and diet Referrals for preventative services - pt to call for eye exam,for hep c screen with next labs Immunizations addressed - none  needed Smoking counseling  - none needed Evidence for depression or other mood disorder - none significant Most recent labs reviewed. I have personally reviewed and have noted: 1) the patient's medical and social history 2) The patient's current medications and supplements 3) The patient's height, weight, and BMI have been recorded in the chart  Declines further labs - already done recent  Diabetes [facility]) Lab Results  Component Value Date   HGBA1C 7.4 (A) 05/13/2020   Stable, pt to continue current medical treatment humalog  Current Outpatient Medications (Endocrine & Metabolic):  .  insulin lispro (HUMALOG KWIKPEN) 100 UNIT/ML KwikPen, 3 times a day (just before each meal), 20-18-14 and pen needles 3/day. .  medroxyPROGESTERone (DEPO-PROVERA) 150 MG/ML injection, Inject 150 mg into the muscle every 3 (three) months.   .  metFORMIN (GLUCOPHAGE-XR) 500 MG 24 hr tablet, Take 2 tablets (1,000 mg total) by mouth daily. .  Semaglutide (RYBELSUS) 14 MG TABS, Take 14 mg by mouth daily.   Current Outpatient Medications (Cardiovascular):  .  atorvastatin (LIPITOR) 20 MG tablet, Take 1 tablet (20 mg total) by mouth daily. .  furosemide (LASIX) 40 MG tablet, TAKE 1 TABLET DAILY .  losartan (COZAAR) 100 MG tablet, Take 1 tablet (100 mg total) by mouth daily. .  metoprolol tartrate (LOPRESSOR) 100 MG tablet, TAKE 2 TABLETS DAILY   Current Outpatient Medications (Respiratory):  .  cetirizine (ZYRTEC) 10 MG tablet, Take 10 mg by mouth daily.     Current Outpatient Medications (Analgesics):  .  traMADol (ULTRAM) 50 MG tablet, Take 1 tablet (50 mg total) by mouth every 6 (six) hours as needed.   Current Outpatient Medications (Hematological):  .  cyanocobalamin (,VITAMIN B-12,) 1000 MCG/ML injection, Inject into the muscle every 30 (thirty) days.  Current Facility-Administered Medications (Hematological):  .  cyanocobalamin ((VITAMIN B-12)) injection 1,000 mcg .  cyanocobalamin ((VITAMIN  B-12)) injection 1,000 mcg .  cyanocobalamin ((VITAMIN B-12)) injection 1,000 mcg  Current Outpatient Medications (Other):  Marland Kitchen  B-D UF III MINI PEN NEEDLES 31G X 5 MM MISC, USE TO INJECT HUMALOG KWIKPEN INTO THE SKIN THREE TIMES A DAY .  baclofen (LIORESAL) 10 MG tablet, Take 1 tablet (10 mg total) by mouth 3 (three) times daily. .  Cholecalciferol (VITAMIN D) 2000 UNITS CAPS, Take 2 capsules by mouth every morning. Reported on 06/05/2015 .  clotrimazole (MYCELEX) 10 MG troche, Take 1 tablet (10 mg total) by mouth 5 (five) times daily. .  Continuous Blood Gluc Sensor (FREESTYLE LIBRE 2 SENSOR) MISC, 1 Device by Does not apply route every 14 (fourteen) days. Marland Kitchen  dalfampridine 10 MG TB12, TAKE 1 TABLET TWICE A DAY .  famotidine (PEPCID) 20 MG tablet, Take 1 tablet (20 mg total) by mouth 2 (two) times daily. Marland Kitchen  gabapentin (NEURONTIN) 300 MG capsule, TAKE 2 CAPSULES IN THE MORNING, 1 CAPSULE IN THE AFTERNOON AND 2 CAPSULES IN THE EVENING .  glucose blood (FREESTYLE TEST STRIPS) test strip, 1 each by Other route 2 (two) times daily. And lancets, 2 times daily. Marland Kitchen  glucose monitoring kit (FREESTYLE) monitoring kit, 1 each by Does not apply route as needed for other. Dispense any model that is covered- dispense testing supplies for Q AC/ HS accuchecks- 1 month supply with one refil. .  Lancets (FREESTYLE) lancets, Use to check blood sugar two time daily. Marland Kitchen  loperamide (IMODIUM) 2 MG capsule, Take 2 mg by mouth as needed for diarrhea or loose stools. .  nortriptyline (PAMELOR) 50 MG capsule, Take 2 capsules (100 mg total) by mouth at bedtime. Marland Kitchen  omeprazole-sodium bicarbonate (ZEGERID) 40-1100 MG per capsule, Take 1 capsule by mouth daily.   .  potassium chloride (KLOR-CON) 10 MEQ tablet, TAKE 2 TABLETS DAILY .  Vitamin D, Ergocalciferol, (DRISDOL) 1.25 MG (50000 UT) CAPS capsule, Take 1 capsule (50,000 Units total) by mouth every 7 (seven) days. .  AUBAGIO 14 MG TABS, Take 1 tablet by mouth  daily.    Vitamin B 12 deficiency For contd B12 IM monthly,  to f/u any worsening symptoms or concerns  Epigastric pain Etiology unclear, to add pepcid 20 bid,  to f/u any worsening symptoms or concerns  Followup: Return in about 6 months (around 12/20/2020).  Cathlean Cower, MD 06/21/2020 7:34 PM [facility]

## 2020-06-19 NOTE — Telephone Encounter (Signed)
essage left by Katharine Look from [facility].   Please send a refill for Aubagio to [facility].

## 2020-06-21 ENCOUNTER — Encounter: Payer: Self-pay | Admitting: Internal Medicine

## 2020-06-21 DIAGNOSIS — R1013 Epigastric pain: Secondary | ICD-10-CM | POA: Insufficient documentation

## 2020-06-21 NOTE — Assessment & Plan Note (Signed)
For contd B12 IM monthly,  to f/u any worsening symptoms or concerns

## 2020-06-21 NOTE — Addendum Note (Signed)
Addended by: Biagio Borg on: 06/21/2020 07:38 PM   Modules accepted: Orders

## 2020-06-21 NOTE — Assessment & Plan Note (Signed)
Etiology unclear, to add pepcid 20 bid,  to f/u any worsening symptoms or concerns

## 2020-06-21 NOTE — Assessment & Plan Note (Addendum)
Age and sex appropriate education and counseling updated with regular exercise and diet Referrals for preventative services - pt to call for eye exam,for hep c screen with next labs Immunizations addressed - none needed Smoking counseling  - none needed Evidence for depression or other mood disorder - none significant Most recent labs reviewed. I have personally reviewed and have noted: 1) the patient's medical and social history 2) The patient's current medications and supplements 3) The patient's height, weight, and BMI have been recorded in the chart  Declines further labs - already done recent

## 2020-06-21 NOTE — Assessment & Plan Note (Signed)
Lab Results  Component Value Date   HGBA1C 7.4 (A) 05/13/2020   Stable, pt to continue current medical treatment humalog  Current Outpatient Medications (Endocrine & Metabolic):  .  insulin lispro (HUMALOG KWIKPEN) 100 UNIT/ML KwikPen, 3 times a day (just before each meal), 20-18-14 and pen needles 3/day. .  medroxyPROGESTERone (DEPO-PROVERA) 150 MG/ML injection, Inject 150 mg into the muscle every 3 (three) months.   .  metFORMIN (GLUCOPHAGE-XR) 500 MG 24 hr tablet, Take 2 tablets (1,000 mg total) by mouth daily. .  Semaglutide (RYBELSUS) 14 MG TABS, Take 14 mg by mouth daily.   Current Outpatient Medications (Cardiovascular):  .  atorvastatin (LIPITOR) 20 MG tablet, Take 1 tablet (20 mg total) by mouth daily. .  furosemide (LASIX) 40 MG tablet, TAKE 1 TABLET DAILY .  losartan (COZAAR) 100 MG tablet, Take 1 tablet (100 mg total) by mouth daily. .  metoprolol tartrate (LOPRESSOR) 100 MG tablet, TAKE 2 TABLETS DAILY   Current Outpatient Medications (Respiratory):  .  cetirizine (ZYRTEC) 10 MG tablet, Take 10 mg by mouth daily.     Current Outpatient Medications (Analgesics):  .  traMADol (ULTRAM) 50 MG tablet, Take 1 tablet (50 mg total) by mouth every 6 (six) hours as needed.   Current Outpatient Medications (Hematological):  .  cyanocobalamin (,VITAMIN B-12,) 1000 MCG/ML injection, Inject into the muscle every 30 (thirty) days.  Current Facility-Administered Medications (Hematological):  .  cyanocobalamin ((VITAMIN B-12)) injection 1,000 mcg .  cyanocobalamin ((VITAMIN B-12)) injection 1,000 mcg .  cyanocobalamin ((VITAMIN B-12)) injection 1,000 mcg  Current Outpatient Medications (Other):  Marland Kitchen  B-D UF III MINI PEN NEEDLES 31G X 5 MM MISC, USE TO INJECT HUMALOG KWIKPEN INTO THE SKIN THREE TIMES A DAY .  baclofen (LIORESAL) 10 MG tablet, Take 1 tablet (10 mg total) by mouth 3 (three) times daily. .  Cholecalciferol (VITAMIN D) 2000 UNITS CAPS, Take 2 capsules by mouth every  morning. Reported on 06/05/2015 .  clotrimazole (MYCELEX) 10 MG troche, Take 1 tablet (10 mg total) by mouth 5 (five) times daily. .  Continuous Blood Gluc Sensor (FREESTYLE LIBRE 2 SENSOR) MISC, 1 Device by Does not apply route every 14 (fourteen) days. Marland Kitchen  dalfampridine 10 MG TB12, TAKE 1 TABLET TWICE A DAY .  famotidine (PEPCID) 20 MG tablet, Take 1 tablet (20 mg total) by mouth 2 (two) times daily. Marland Kitchen  gabapentin (NEURONTIN) 300 MG capsule, TAKE 2 CAPSULES IN THE MORNING, 1 CAPSULE IN THE AFTERNOON AND 2 CAPSULES IN THE EVENING .  glucose blood (FREESTYLE TEST STRIPS) test strip, 1 each by Other route 2 (two) times daily. And lancets, 2 times daily. Marland Kitchen  glucose monitoring kit (FREESTYLE) monitoring kit, 1 each by Does not apply route as needed for other. Dispense any model that is covered- dispense testing supplies for Q AC/ HS accuchecks- 1 month supply with one refil. .  Lancets (FREESTYLE) lancets, Use to check blood sugar two time daily. Marland Kitchen  loperamide (IMODIUM) 2 MG capsule, Take 2 mg by mouth as needed for diarrhea or loose stools. .  nortriptyline (PAMELOR) 50 MG capsule, Take 2 capsules (100 mg total) by mouth at bedtime. Marland Kitchen  omeprazole-sodium bicarbonate (ZEGERID) 40-1100 MG per capsule, Take 1 capsule by mouth daily.   .  potassium chloride (KLOR-CON) 10 MEQ tablet, TAKE 2 TABLETS DAILY .  Vitamin D, Ergocalciferol, (DRISDOL) 1.25 MG (50000 UT) CAPS capsule, Take 1 capsule (50,000 Units total) by mouth every 7 (seven) days. .  AUBAGIO 14  MG TABS, Take 1 tablet by mouth daily.

## 2020-06-23 ENCOUNTER — Other Ambulatory Visit: Payer: Self-pay

## 2020-06-23 ENCOUNTER — Encounter: Payer: Self-pay | Admitting: Internal Medicine

## 2020-06-23 MED ORDER — FUROSEMIDE 40 MG PO TABS: 40.0000 mg | ORAL_TABLET | Freq: Every day | ORAL | 3 refills | Status: DC

## 2020-06-25 ENCOUNTER — Other Ambulatory Visit: Payer: Self-pay

## 2020-06-25 MED ORDER — METOPROLOL TARTRATE 100 MG PO TABS: 200.0000 mg | ORAL_TABLET | Freq: Every day | ORAL | 3 refills | Status: DC

## 2020-06-25 MED ORDER — LOSARTAN POTASSIUM 100 MG PO TABS: 100.0000 mg | ORAL_TABLET | Freq: Every day | ORAL | 3 refills | Status: DC

## 2020-06-25 MED ORDER — POTASSIUM CHLORIDE CRYS ER 10 MEQ PO TBCR: 20.0000 meq | EXTENDED_RELEASE_TABLET | Freq: Every day | ORAL | 3 refills | Status: DC

## 2020-06-26 ENCOUNTER — Encounter: Payer: Self-pay | Admitting: Internal Medicine

## 2020-06-28 ENCOUNTER — Other Ambulatory Visit: Payer: Self-pay | Admitting: Internal Medicine

## 2020-06-28 MED ORDER — FUROSEMIDE 40 MG PO TABS: 40.0000 mg | ORAL_TABLET | Freq: Every day | ORAL | 3 refills | Status: DC

## 2020-07-07 ENCOUNTER — Other Ambulatory Visit: Payer: Self-pay

## 2020-07-08 ENCOUNTER — Ambulatory Visit (INDEPENDENT_AMBULATORY_CARE_PROVIDER_SITE_OTHER): Payer: Medicare Other

## 2020-07-08 ENCOUNTER — Telehealth: Payer: Self-pay

## 2020-07-08 DIAGNOSIS — Z309 Encounter for contraceptive management, unspecified: Secondary | ICD-10-CM

## 2020-07-08 MED ORDER — MEDROXYPROGESTERONE ACETATE 150 MG/ML IM SUSY
150.0000 mg | PREFILLED_SYRINGE | INTRAMUSCULAR | Status: AC
  Administered 2020-07-08 – 2020-10-07 (×2): 150 mg via INTRAMUSCULAR

## 2020-07-08 MED ORDER — MEDROXYPROGESTERONE ACETATE 150 MG/ML IM SUSP
150.0000 mg | INTRAMUSCULAR | Status: DC
Start: 2020-07-08 — End: 2020-07-08

## 2020-07-08 NOTE — Progress Notes (Signed)
Pt here for q3 month Depo-Provera injection per Dr Jenny Reichmann.  Medroxyprogesterone 150mg  given IM right ventrogluteal and pt tolerated injection well.  Depo injection scheduled for 10/08/20.

## 2020-07-08 NOTE — Telephone Encounter (Signed)
Yes, please ok for verbal for this

## 2020-07-08 NOTE — Telephone Encounter (Signed)
Order clarification needed.   Pt has nurse visit for "depo" shot.  Please clarify if Depo Provera 150mg  IM q3 months until 06/2021. Last OV 06/19/20

## 2020-07-10 ENCOUNTER — Other Ambulatory Visit: Payer: Self-pay

## 2020-07-11 ENCOUNTER — Other Ambulatory Visit: Payer: Self-pay | Admitting: Internal Medicine

## 2020-07-11 MED ORDER — FUROSEMIDE 40 MG PO TABS: 40.0000 mg | ORAL_TABLET | Freq: Every day | ORAL | 3 refills | Status: DC

## 2020-07-15 ENCOUNTER — Ambulatory Visit: Payer: Medicare Other | Admitting: Endocrinology

## 2020-07-24 ENCOUNTER — Ambulatory Visit (INDEPENDENT_AMBULATORY_CARE_PROVIDER_SITE_OTHER): Payer: Medicare Other

## 2020-07-24 ENCOUNTER — Other Ambulatory Visit: Payer: Self-pay

## 2020-07-24 DIAGNOSIS — E538 Deficiency of other specified B group vitamins: Secondary | ICD-10-CM | POA: Diagnosis not present

## 2020-07-24 NOTE — Progress Notes (Signed)
Pt here for monthly B12 injection per Dr. Jenny Reichmann  B12 1072mcg given IM right arm, and pt tolerated injection well.  Next B12 injection scheduled for 08/26/20

## 2020-08-26 ENCOUNTER — Other Ambulatory Visit: Payer: Self-pay

## 2020-08-26 ENCOUNTER — Ambulatory Visit (INDEPENDENT_AMBULATORY_CARE_PROVIDER_SITE_OTHER): Payer: Medicare Other

## 2020-08-26 DIAGNOSIS — E538 Deficiency of other specified B group vitamins: Secondary | ICD-10-CM | POA: Diagnosis not present

## 2020-08-26 NOTE — Progress Notes (Signed)
Patient ID: Courtney [name], female   DOB: 08/10/1975, 45 y.o.   MRN: [id number] Medical screening examination/treatment/procedure(s) were performed by non-physician practitioner and as supervising physician I was immediately available for consultation/collaboration.  I agree with above. Marwah Disbro, MD  

## 2020-08-26 NOTE — Progress Notes (Signed)
Patient came into the office to receive her vitamin b12 injection. She tolerated the injection well. No questions or concerns.

## 2020-10-04 ENCOUNTER — Other Ambulatory Visit: Payer: Self-pay | Admitting: Neurology

## 2020-10-05 ENCOUNTER — Other Ambulatory Visit: Payer: Self-pay

## 2020-10-07 ENCOUNTER — Ambulatory Visit (INDEPENDENT_AMBULATORY_CARE_PROVIDER_SITE_OTHER): Payer: Medicare Other

## 2020-10-07 ENCOUNTER — Other Ambulatory Visit: Payer: Self-pay

## 2020-10-07 DIAGNOSIS — E538 Deficiency of other specified B group vitamins: Secondary | ICD-10-CM

## 2020-10-07 DIAGNOSIS — Z309 Encounter for contraceptive management, unspecified: Secondary | ICD-10-CM

## 2020-10-07 NOTE — Progress Notes (Addendum)
Patient came into the office to receive her vitamin b12 injection and her depo-provera injection. She tolerated both injections well. No questions or concerns at this time.

## 2020-10-08 ENCOUNTER — Ambulatory Visit: Payer: Medicare Other

## 2020-10-18 ENCOUNTER — Other Ambulatory Visit: Payer: Self-pay | Admitting: Neurology

## 2020-11-01 ENCOUNTER — Other Ambulatory Visit: Payer: Self-pay

## 2020-11-06 ENCOUNTER — Ambulatory Visit (INDEPENDENT_AMBULATORY_CARE_PROVIDER_SITE_OTHER): Payer: Medicare Other

## 2020-11-06 ENCOUNTER — Other Ambulatory Visit: Payer: Self-pay

## 2020-11-06 DIAGNOSIS — E538 Deficiency of other specified B group vitamins: Secondary | ICD-10-CM

## 2020-11-06 MED ORDER — CYANOCOBALAMIN 1000 MCG/ML IJ SOLN
1000.0000 ug | INTRAMUSCULAR | Status: AC
  Administered 2020-11-06 – 2020-12-09 (×2): 1000 ug via INTRAMUSCULAR

## 2020-11-06 NOTE — Progress Notes (Signed)
Pt here for monthly B12 injection per Dr.John  B12 1017mg given IM, and pt tolerated injection well.  Next B12 injection scheduled for 12/09/20

## 2020-11-20 ENCOUNTER — Other Ambulatory Visit: Payer: Self-pay

## 2020-11-20 MED ORDER — DALFAMPRIDINE ER 10 MG PO TB12: 10.0000 mg | ORAL_TABLET | Freq: Two times a day (BID) | ORAL | 0 refills | Status: DC

## 2020-12-01 ENCOUNTER — Telehealth: Payer: Self-pay

## 2020-12-01 NOTE — Telephone Encounter (Signed)
Fax received from [facility], Please advise pt we have tried to contact her on multiple occassions.     LMOVm for pt to call [facility] [phone]

## 2020-12-09 ENCOUNTER — Other Ambulatory Visit: Payer: Self-pay

## 2020-12-09 ENCOUNTER — Ambulatory Visit (INDEPENDENT_AMBULATORY_CARE_PROVIDER_SITE_OTHER): Payer: Medicare Other

## 2020-12-09 DIAGNOSIS — E538 Deficiency of other specified B group vitamins: Secondary | ICD-10-CM

## 2020-12-09 NOTE — Progress Notes (Signed)
Pt here for monthly B12 injection per Dr Jenny Reichmann.  B12 1054mg given IM right deltoid and pt tolerated injection well.  Next B12 injection scheduled for 01/07/21.

## 2020-12-16 ENCOUNTER — Other Ambulatory Visit: Payer: Self-pay | Admitting: Neurology

## 2020-12-18 ENCOUNTER — Telehealth: Payer: Self-pay

## 2020-12-18 NOTE — Telephone Encounter (Signed)
Patient to schedule a visit to ger further refills.

## 2020-12-18 NOTE — Telephone Encounter (Signed)
LMOVM pt to call and schedule a visit. Refills sent in for thirty days.

## 2020-12-20 ENCOUNTER — Other Ambulatory Visit: Payer: Self-pay | Admitting: Neurology

## 2020-12-20 DIAGNOSIS — G35 Multiple sclerosis: Secondary | ICD-10-CM

## 2020-12-23 ENCOUNTER — Other Ambulatory Visit: Payer: Self-pay

## 2020-12-23 ENCOUNTER — Telehealth: Payer: Self-pay

## 2020-12-23 DIAGNOSIS — G35 Multiple sclerosis: Secondary | ICD-10-CM

## 2020-12-23 NOTE — Telephone Encounter (Signed)
Pt called no answer left a voice mail for her to call back so we can get her scheduled for an appointment.

## 2020-12-23 NOTE — Progress Notes (Signed)
NEUROLOGY FOLLOW UP OFFICE NOTE  Courtney [name] [id number]  Assessment/Plan:   Multiple sclerosis  Reports slight increased weakness.  Appears slightly weak on right.  Also feels slower as demonstrated on testing.  Will check MRI of brain and cervical spine with and without contrast to evaluate for disease progression DMT:  [facility] Pain management:  Gabapentin 652m/300mg/600mg, baclofen, nortriptyline 1050mat bedtime May discontinue Ampyra as likely no longer effective D3 10,000 IU daily Check CBC with diff, CMP, vit D in 6 months Follow up 6 months.  Sooner if imaging shows disease progression in which case to discuss change in DMT.  Subjective:  Courtney Shetleys a [age]ear old right-handed Caucasian woman who follows up for multiple sclerosis.   UPDATE: Current DMT:  [facility] Other current medications: Ampyra, baclofen 10 mg, gabapentin 6002m00mg/600mg, D3 10000 IU daily, B12 1000m62mnjection every 30 days, nortriptyline 100 mg   12/23/2020 LABS:  Hepatic panel normal.  Vit D level 71.52.     Vision:No issues Motor: notes residual right upper and lower extremity weakness.  Notes grip in both hands aren't as strong. Sensory: may get facial numbness with heat and stress.  Pain: Last week she had a pressure wrapped around her mid torso.  Occasional pain and tightness in her lower extremities. Gait: balance okay but feels that she is walking a little slower. Bowel/Bladder:  No change Fatigue: No Cognition: Sometimes she has trouble remembering new information Mood: Stable    HISTORY: Onset of symptoms and diagnosis occurred in 2006.  She experienced an episode of numbness in her right arm and both lower extremities.  She was dropping objects.  MRI of brain reportedly showed "solitary right mid-to-posterior frontal periventricular white matter lesion".  MRI of spine revealed no cord demyelination.  CSF analysis from lumbar puncture reportedly revealed oligoclonal bands and an  elevated IgG index.  She was diagnosed with clinically isolated syndrome but was officially diagnosed with multiple sclerosis a few months later when she developed a relapse with left hand numbness.  In February 2013, she developed left arm pain and swelling.  MRI of cervical spine revealed an enhancing spinal cord lesion at C4 on the right.  She was treated with IV Solumedrol.  In 2014, she had an episode of optic neuritis in her right eye, treated with IV Solumedrol.  In September-October 2019, she reported to 4 weeks history of tingling in her face, which may occur off and on for 10 minutes 3 times a week.  She was prescribed a prednisone taper.  Compared to prior MRI from 02/07/2017, repeat MRI of the brain and cervical spine with and without contrast from 02/19/2018, which were personally reviewed, demonstrated mildly progressed bifrontal cerebral white matter changes as well as probable additional abnormal signal in the left cord at the C6-7 level but no abnormal enhancement to suggest active demyelinating disease. Questionable if paresthesias were secondary to B12 deficiency as well.  Other history:  History of migraines and ocular migraines   There is no family history of MS.   Past disease modifying therapy:  Avonex (difficulty injecting), Rebif (painful injections), Tecfidera (GI side effects)   Imaging: 06/03/11:  MRI brain with and without contrast:  single right frontal periventricular white matter lesion with high T2 FLAIR signal 06/03/11:  MRI cervical spine with and without contrast:  abnormal rim enhancing lesion in the spinal cord at the level of C4 on the right which mildly expands the cord. 01/21/12:  MRI brain with  and without contrast:  unchanged nonspecific right periventricular white matter lesion 01/21/12: MRI cervical spine with and without contrast: stable nonenhancing cord lesion at C4 11/14/12: MRI brain with and without contrast.  No change 11/14/12:  MRI cervical spine with and  without contrast:  No change 12/02/13: MRI brain with and without contrast:  1. No substantial change in the right frontal juxtacortical, right corona radiata, and left periatrial white matter T2/FLAIR hyperintensities. While nonspecific, these likely reflect demyelinating lesions in this patient with a history of multiple sclerosis and concomitant cervical cord lesion. No new lesions are identified and none of the existing lesions enhance or restrict diffusion.  2. Subcentimeter left parafalcine dural based lesion as described above has slowly enlarged over time and demonstrates equivocal enhancement, but may represent a meningioma.  Recommend attention on follow up imaging. 12/02/13:  MRI cervical spine with and without contrast:  No change 12/15/14:  MRI brain with and without contrast:  1.  Similar appearance of focal T2/FLAIR hyperintense white matter lesion within the right lateral body of the corpus callosum without associated enhancement to suggest active demyelination.  2.  No new or enlarging white matter lesions identified.  3.  Slowly enlarging nonenhancing nodular lesion along the left anterior aspect of the falx cerebri, which may reflect a small meningioma. 12/15/14:  MRI cervical spine with and without contrast:  no change 10/10/15:  MRI brain with and without contrast:  no change 10/10/15:  MRI cervical spine with and without contrast:  no change 02/07/17:  MRI brain with and without contrast: No significant change since 12/24/2008 02/07/2017: MRI cervical spine with and without contrast: Chronic lesion at C4 level, stable since 2013 02/19/18:  MRI brain with and without contrast: Compared to prior imaging from 02/07/17, mildly progressed bifrontal cerebral white matter changes, right greater than left, but no abnormal enhancement to suggest active disease. 02/19/18:  MRI cervical spine with and without contrast: Again demonstrated chronic lesion at C4 level as well as probable subtle patchy cord  signal abnormality within the left cord at level of C6-7 not definitely seen on prior imaging from 02/07/17 but no abnormal enhancement to suggest active disease. 10/30/2019 MRI brain and cervical spine with and without contrast:  no significant change compared to prior imaging from 2019  [place]: Past Medical History:  Diagnosis Date   ALLERGIC RHINITIS 03/13/2007   Allergy    Anemia, B12 deficiency    Anxiety    C. difficile colitis    CARPAL TUNNEL SYNDROME, BILATERAL 01/13/2010   Cervical lesion 07/13/2011   Spine lesion - indeterminate, for f/u MRI may 2013 per neurology   Chronic fatigue    CTS (carpal tunnel syndrome)    Diabetes mellitus without complication ([place])    ESSENTIAL HYPERTENSION 03/13/2007   Fibromyalgia    GERD (gastroesophageal reflux disease)    Heart murmur    Hx of adenomatous polyp of colon 06/09/2009   Hyperlipidemia    HYPERLIPIDEMIA 03/13/2007   Irritable bowel syndrome 05/24/2010   Multiple sclerosis ([place])    OBSTRUCTIVE SLEEP APNEA 12/08/2008   Sleep apnea    uses c pap   VITAMIN D DEFICIENCY 01/13/2010    MEDICATIONS: Current Outpatient Medications on File Prior to Visit  Medication Sig Dispense Refill   atorvastatin (LIPITOR) 20 MG tablet Take 1 tablet (20 mg total) by mouth daily. 90 tablet 3   AUBAGIO 14 MG TABS Take 1 tablet by mouth daily. 90 tablet 1   B-D UF III  MINI PEN NEEDLES 31G X 5 MM MISC USE TO INJECT HUMALOG KWIKPEN INTO THE SKIN THREE TIMES A DAY 90 each 0   baclofen (LIORESAL) 10 MG tablet TAKE 1 TABLET BY MOUTH 3  TIMES DAILY 270 tablet 0   cetirizine (ZYRTEC) 10 MG tablet Take 10 mg by mouth daily.       Cholecalciferol (VITAMIN D) 2000 UNITS CAPS Take 2 capsules by mouth every morning. Reported on 06/05/2015     clotrimazole (MYCELEX) 10 MG troche Take 1 tablet (10 mg total) by mouth 5 (five) times daily. 35 tablet 2   Continuous Blood Gluc Sensor (FREESTYLE LIBRE 2 SENSOR) MISC 1 Device by Does not apply route every 14  (fourteen) days. 6 each 3   cyanocobalamin (,VITAMIN B-12,) 1000 MCG/ML injection Inject into the muscle every 30 (thirty) days.     dalfampridine 10 MG TB12 Take 1 tablet (10 mg total) by mouth 2 (two) times daily. 180 tablet 0   famotidine (PEPCID) 20 MG tablet Take 1 tablet (20 mg total) by mouth 2 (two) times daily. 180 tablet 3   furosemide (LASIX) 40 MG tablet Take 1 tablet (40 mg total) by mouth daily. 90 tablet 3   gabapentin (NEURONTIN) 300 MG capsule TAKE 2 CAPSULES BY MOUTH IN THE MORNING 1 CAPSULE BY  MOUTH IN THE AFTERNOON AND  2 CAPSULES BY MOUTH IN THE  EVENING 150 capsule 0   glucose blood (FREESTYLE TEST STRIPS) test strip 1 each by Other route 2 (two) times daily. And lancets, 2 times daily. 180 each 3   glucose monitoring kit (FREESTYLE) monitoring kit 1 each by Does not apply route as needed for other. Dispense any model that is covered- dispense testing supplies for Q AC/ HS accuchecks- 1 month supply with one refil. 1 each 1   insulin lispro (HUMALOG KWIKPEN) 100 UNIT/ML KwikPen 3 times a day (just before each meal), 20-18-14 and pen needles 3/day. 60 mL 3   Lancets (FREESTYLE) lancets Use to check blood sugar two time daily. 150 each 2   loperamide (IMODIUM) 2 MG capsule Take 2 mg by mouth as needed for diarrhea or loose stools.     losartan (COZAAR) 100 MG tablet Take 1 tablet (100 mg total) by mouth daily. 90 tablet 3   medroxyPROGESTERone (DEPO-PROVERA) 150 MG/ML injection Inject 150 mg into the muscle every 3 (three) months.       metFORMIN (GLUCOPHAGE-XR) 500 MG 24 hr tablet Take 2 tablets (1,000 mg total) by mouth daily. 180 tablet 3   metoprolol tartrate (LOPRESSOR) 100 MG tablet Take 2 tablets (200 mg total) by mouth daily. 180 tablet 3   nortriptyline (PAMELOR) 50 MG capsule TAKE 2 CAPSULES BY MOUTH AT BEDTIME 180 capsule 0   omeprazole-sodium bicarbonate (ZEGERID) 40-1100 MG per capsule Take 1 capsule by mouth daily.       potassium chloride (KLOR-CON) 10 MEQ tablet  Take 2 tablets (20 mEq total) by mouth daily. 180 tablet 3   Semaglutide (RYBELSUS) 14 MG TABS Take 14 mg by mouth daily. 90 tablet 3   traMADol (ULTRAM) 50 MG tablet Take 1 tablet (50 mg total) by mouth every 6 (six) hours as needed. 20 tablet 1   Current Facility-Administered Medications on File Prior to Visit  Medication Dose Route Frequency Provider Last Rate Last Admin   medroxyPROGESTERone Acetate SUSY 150 mg  150 mg Intramuscular Q90 days Biagio Borg, MD   150 mg at 10/07/20 1444    ALLERGIES: Allergies  Allergen Reactions   Ciprofloxacin In D5w Rash   Penicillins     REACTION: rash   Tricor [Fenofibrate] Rash    FAMILY HISTORY: Family History  Problem Relation Age of Onset   Migraines Mother    Hypertension Father    COPD Father    Diabetes Father    Prostate cancer Maternal Uncle        prostate cancer   Lung cancer Maternal Grandmother    Stroke Maternal Grandfather    Heart attack Maternal Grandfather    Diabetes Other    Hypertension Other    Asthma Daughter    Colon cancer Neg Hx    Esophageal cancer Neg Hx    Rectal cancer Neg Hx    Stomach cancer Neg Hx       Objective:  Blood pressure (!) 169/93, pulse 86, height '5\' 1"'  (1.549 m), weight 194 lb 12.8 oz (88.4 kg), SpO2 99 %.  General: No acute distress.  Patient appears well-groomed.   Head:  Normocephalic/atraumatic Eyes:  Fundi examined but not visualized Neck: supple, no paraspinal tenderness, full range of motion Heart:  Regular rate and rhythm Lungs:  Clear to auscultation bilaterally Back: No paraspinal tenderness Neurological Exam: alert and oriented to person, place, and time.  Speech fluent and not dysarthric, language intact.  CN II-XII intact. Bulk and tone normal, muscle strength 5-/5 bilateral deltoids, right grip and right lower extremity, otherwise 5/5 throughout.  Sensation to pinprick and vibration intact.  Deep tendon reflexes 2+ throughout, toes downgoing.  Finger to nose testing  intact.  Gait steady.  Timed 25 foot test 10.03 seconds. Romberg negative.   Metta Clines, DO  CC: Cathlean Cower, MD

## 2020-12-23 NOTE — Telephone Encounter (Signed)
Pt also needs to come in for lab work, Order is in [employer]

## 2020-12-24 ENCOUNTER — Encounter: Payer: Self-pay | Admitting: Internal Medicine

## 2020-12-24 ENCOUNTER — Other Ambulatory Visit: Payer: Self-pay | Admitting: Internal Medicine

## 2020-12-24 ENCOUNTER — Ambulatory Visit (INDEPENDENT_AMBULATORY_CARE_PROVIDER_SITE_OTHER): Payer: Medicare Other | Admitting: Internal Medicine

## 2020-12-24 ENCOUNTER — Other Ambulatory Visit: Payer: Self-pay

## 2020-12-24 VITALS — BP 140/90 | HR 87 | Temp 98.4°F | Ht 61.0 in | Wt 192.0 lb

## 2020-12-24 DIAGNOSIS — E7800 Pure hypercholesterolemia, unspecified: Secondary | ICD-10-CM | POA: Diagnosis not present

## 2020-12-24 DIAGNOSIS — M7662 Achilles tendinitis, left leg: Secondary | ICD-10-CM

## 2020-12-24 DIAGNOSIS — E119 Type 2 diabetes mellitus without complications: Secondary | ICD-10-CM | POA: Diagnosis not present

## 2020-12-24 DIAGNOSIS — M7660 Achilles tendinitis, unspecified leg: Secondary | ICD-10-CM | POA: Insufficient documentation

## 2020-12-24 DIAGNOSIS — Z23 Encounter for immunization: Secondary | ICD-10-CM | POA: Diagnosis not present

## 2020-12-24 DIAGNOSIS — Z794 Long term (current) use of insulin: Secondary | ICD-10-CM

## 2020-12-24 DIAGNOSIS — I10 Essential (primary) hypertension: Secondary | ICD-10-CM | POA: Diagnosis not present

## 2020-12-24 DIAGNOSIS — M7989 Other specified soft tissue disorders: Secondary | ICD-10-CM

## 2020-12-24 DIAGNOSIS — E559 Vitamin D deficiency, unspecified: Secondary | ICD-10-CM | POA: Diagnosis not present

## 2020-12-24 DIAGNOSIS — E538 Deficiency of other specified B group vitamins: Secondary | ICD-10-CM

## 2020-12-24 LAB — BASIC METABOLIC PANEL
BUN: 9 mg/dL (ref 6–23)
CO2: 28 mEq/L (ref 19–32)
Calcium: 9.6 mg/dL (ref 8.4–10.5)
Chloride: 102 mEq/L (ref 96–112)
Creatinine, Ser: 0.87 mg/dL (ref 0.40–1.20)
GFR: 80.92 mL/min (ref >60.00)
Glucose, Bld: 165 mg/dL — ABNORMAL HIGH (ref 70–99)
Potassium: 4.2 mEq/L (ref 3.5–5.1)
Sodium: 138 mEq/L (ref 135–145)

## 2020-12-24 LAB — VITAMIN B12: Vitamin B-12: 423 pg/mL (ref 211–911)

## 2020-12-24 LAB — HEPATIC FUNCTION PANEL
ALT: 26 U/L (ref 0–35)
AST: 24 U/L (ref 0–37)
Albumin: 4.2 g/dL (ref 3.5–5.2)
Alkaline Phosphatase: 74 U/L (ref 39–117)
Bilirubin, Direct: 0.1 mg/dL (ref 0.0–0.3)
Total Bilirubin: 0.5 mg/dL (ref 0.2–1.2)
Total Protein: 6.8 g/dL (ref 6.0–8.3)

## 2020-12-24 LAB — LIPID PANEL
Cholesterol: 156 mg/dL (ref 0–200)
HDL: 40.9 mg/dL (ref >39.00)
NonHDL: 114.69
Total CHOL/HDL Ratio: 4
Triglycerides: 314 mg/dL — ABNORMAL HIGH (ref 0.0–149.0)
VLDL: 62.8 mg/dL — ABNORMAL HIGH (ref 0.0–40.0)

## 2020-12-24 LAB — LDL CHOLESTEROL, DIRECT: Direct LDL: 86 mg/dL

## 2020-12-24 LAB — HEMOGLOBIN A1C: Hgb A1c MFr Bld: 8.1 % — ABNORMAL HIGH (ref 4.6–6.5)

## 2020-12-24 LAB — VITAMIN D 25 HYDROXY (VIT D DEFICIENCY, FRACTURES): VITD: 71.52 ng/mL (ref 30.00–100.00)

## 2020-12-24 MED ORDER — ATORVASTATIN CALCIUM 40 MG PO TABS: 40.0000 mg | ORAL_TABLET | Freq: Every day | ORAL | 3 refills | Status: DC

## 2020-12-24 NOTE — Assessment & Plan Note (Signed)
Last vitamin D Lab Results  Component Value Date   VD25OH 72.21 05/07/2020   Stable, cont oral replacement

## 2020-12-24 NOTE — Progress Notes (Signed)
Patient ID: Courtney [name], female   DOB: Aug 05, 1975, 45 y.o.   MRN: [id number]        Chief Complaint: follow up left heel pain, bilateral leg swelling, dm, low b12 and D, htn       HPI:  Courtney [name] is a 45 y.o. female here overall doing ok.  Pt denies chest pain, increased sob or doe, wheezing, orthopnea, PND, increased LE swelling, palpitations, dizziness or syncope.   Pt denies polydipsia, polyuria, or new focal neuro s/s.   Pt denies fever, wt loss, night sweats, loss of appetite, or other constitutional symptoms   States BP at home < 140/90.  Plans to call GYN and eye doctor soon.  Sees neurology every 6 mo.   Wt Readings from Last 3 Encounters:  12/25/20 194 lb 12.8 oz (88.4 kg)  12/24/20 192 lb (87.1 kg)  06/19/20 190 lb (86.2 kg)   BP Readings from Last 3 Encounters:  12/25/20 (!) 169/93  12/24/20 140/90  06/19/20 122/76         Past Medical History:  Diagnosis Date   ALLERGIC RHINITIS 03/13/2007   Allergy    Anemia, B12 deficiency    Anxiety    C. difficile colitis    CARPAL TUNNEL SYNDROME, BILATERAL 01/13/2010   Cervical lesion 07/13/2011   Spine lesion - indeterminate, for f/u MRI may 2013 per neurology   Chronic fatigue    CTS (carpal tunnel syndrome)    Diabetes mellitus without complication ([place])    ESSENTIAL HYPERTENSION 03/13/2007   Fibromyalgia    GERD (gastroesophageal reflux disease)    Heart murmur    Hx of adenomatous polyp of colon 06/09/2009   Hyperlipidemia    HYPERLIPIDEMIA 03/13/2007   Irritable bowel syndrome 05/24/2010   Multiple sclerosis ([place])    OBSTRUCTIVE SLEEP APNEA 12/08/2008   Sleep apnea    uses c pap   VITAMIN D DEFICIENCY 01/13/2010   Past Surgical History:  Procedure Laterality Date   APPENDECTOMY  07/23/12   CARPAL TUNNEL RELEASE  01/2010   bilateral   COLONOSCOPY W/ BIOPSIES AND POLYPECTOMY  05/2009   2 tubular adenomas   LAPAROSCOPIC APPENDECTOMY N/A 07/23/2012   Procedure: APPENDECTOMY LAPAROSCOPIC;  Surgeon: Gayland Curry, MD;   Location: [facility];  Service: General;  Laterality: N/A;    reports that she has never smoked. She has never used smokeless tobacco. She reports that she does not drink alcohol and does not use drugs. family history includes Asthma in her daughter; COPD in her father; Diabetes in her father and another family member; Heart attack in her maternal grandfather; Hypertension in her father and another family member; Lung cancer in her maternal grandmother; Migraines in her mother; Prostate cancer in her maternal uncle; Stroke in her maternal grandfather. Allergies  Allergen Reactions   Ciprofloxacin In D5w Rash   Penicillins     REACTION: rash   Tricor [Fenofibrate] Rash   Current Outpatient Medications on File Prior to Visit  Medication Sig Dispense Refill   AUBAGIO 14 MG TABS Take 1 tablet by mouth daily. 90 tablet 1   B-D UF III MINI PEN NEEDLES 31G X 5 MM MISC USE TO INJECT HUMALOG KWIKPEN INTO THE SKIN THREE TIMES A DAY 90 each 0   baclofen (LIORESAL) 10 MG tablet TAKE 1 TABLET BY MOUTH 3  TIMES DAILY 270 tablet 0   cetirizine (ZYRTEC) 10 MG tablet Take 10 mg by mouth daily.       Cholecalciferol (VITAMIN  D) 2000 UNITS CAPS Take 2 capsules by mouth every morning. Reported on 06/05/2015     clotrimazole (MYCELEX) 10 MG troche Take 1 tablet (10 mg total) by mouth 5 (five) times daily. (Patient not taking: Reported on 12/25/2020) 35 tablet 2   Continuous Blood Gluc Sensor (FREESTYLE LIBRE 2 SENSOR) MISC 1 Device by Does not apply route every 14 (fourteen) days. 6 each 3   cyanocobalamin (,VITAMIN B-12,) 1000 MCG/ML injection Inject into the muscle every 30 (thirty) days.     famotidine (PEPCID) 20 MG tablet Take 1 tablet (20 mg total) by mouth 2 (two) times daily. 180 tablet 3   furosemide (LASIX) 40 MG tablet Take 1 tablet (40 mg total) by mouth daily. 90 tablet 3   gabapentin (NEURONTIN) 300 MG capsule TAKE 2 CAPSULES BY MOUTH IN THE MORNING 1 CAPSULE BY  MOUTH IN THE AFTERNOON AND  2 CAPSULES BY  MOUTH IN THE  EVENING 150 capsule 0   glucose blood (FREESTYLE TEST STRIPS) test strip 1 each by Other route 2 (two) times daily. And lancets, 2 times daily. 180 each 3   glucose monitoring kit (FREESTYLE) monitoring kit 1 each by Does not apply route as needed for other. Dispense any model that is covered- dispense testing supplies for Q AC/ HS accuchecks- 1 month supply with one refil. 1 each 1   insulin lispro (HUMALOG KWIKPEN) 100 UNIT/ML KwikPen 3 times a day (just before each meal), 20-18-14 and pen needles 3/day. 60 mL 3   Lancets (FREESTYLE) lancets Use to check blood sugar two time daily. 150 each 2   loperamide (IMODIUM) 2 MG capsule Take 2 mg by mouth as needed for diarrhea or loose stools.     losartan (COZAAR) 100 MG tablet Take 1 tablet (100 mg total) by mouth daily. 90 tablet 3   medroxyPROGESTERone (DEPO-PROVERA) 150 MG/ML injection Inject 150 mg into the muscle every 3 (three) months.       metFORMIN (GLUCOPHAGE-XR) 500 MG 24 hr tablet Take 2 tablets (1,000 mg total) by mouth daily. 180 tablet 3   metoprolol tartrate (LOPRESSOR) 100 MG tablet Take 2 tablets (200 mg total) by mouth daily. 180 tablet 3   nortriptyline (PAMELOR) 50 MG capsule TAKE 2 CAPSULES BY MOUTH AT BEDTIME 180 capsule 0   omeprazole-sodium bicarbonate (ZEGERID) 40-1100 MG per capsule Take 1 capsule by mouth daily.       potassium chloride (KLOR-CON) 10 MEQ tablet Take 2 tablets (20 mEq total) by mouth daily. 180 tablet 3   Semaglutide (RYBELSUS) 14 MG TABS Take 14 mg by mouth daily. 90 tablet 3   traMADol (ULTRAM) 50 MG tablet Take 1 tablet (50 mg total) by mouth every 6 (six) hours as needed. 20 tablet 1   Current Facility-Administered Medications on File Prior to Visit  Medication Dose Route Frequency Provider Last Rate Last Admin   medroxyPROGESTERone Acetate SUSY 150 mg  150 mg Intramuscular Q90 days Biagio Borg, MD   150 mg at 10/07/20 1444        ROS:  All others reviewed and negative.  Objective         PE:  BP 140/90 (BP Location: Right Arm, Patient Position: Sitting, Cuff Size: Large)   Pulse 87   Temp 98.4 F (36.9 C) (Oral)   Ht _0  (1.549 m)   Wt 192 lb (87.1 kg)   SpO2 100%   BMI 36.28 kg/m  Constitutional: Pt appears in NAD               HENT: Head: NCAT.                Right Ear: External ear normal.                 Left Ear: External ear normal.                Eyes: . Pupils are equal, round, and reactive to light. Conjunctivae and EOM are normal               Nose: without d/c or deformity               Neck: Neck supple. Gross normal ROM               Cardiovascular: Normal rate and regular rhythm.                 Pulmonary/Chest: Effort normal and breath sounds without rales or wheezing.                Abd:  Soft, NT, ND, + BS, no organomegaly              Left achilles insertion site tender swelling               Neurological: Pt is alert. At baseline orientation, motor grossly intact               Skin: Skin is warm. No rashes, no other new lesions, LE edema - trace chronic bilateral               Psychiatric: Pt behavior is normal without agitation   Micro: none  Cardiac tracings I have personally interpreted today:  none  Pertinent Radiological findings (summarize): none   Lab Results  Component Value Date   WBC 7.0 05/07/2020   HGB 11.5 (L) 05/07/2020   HCT 35.1 (L) 05/07/2020   PLT 306.0 05/07/2020   GLUCOSE 165 (H) 12/24/2020   CHOL 156 12/24/2020   TRIG 314.0 (H) 12/24/2020   HDL 40.90 12/24/2020   LDLDIRECT 86.0 12/24/2020   ALT 26 12/24/2020   AST 24 12/24/2020   NA 138 12/24/2020   K 4.2 12/24/2020   CL 102 12/24/2020   CREATININE 0.87 12/24/2020   BUN 9 12/24/2020   CO2 28 12/24/2020   TSH 1.63 05/22/2019   INR 1.0 12/23/2010   HGBA1C 8.1 (H) 12/24/2020   MICROALBUR 0.9 06/12/2019   Assessment/Plan:  Courtney [name] is a 45 y.o. White or Caucasian [1] female with  has a past medical history of ALLERGIC RHINITIS  (03/13/2007), Allergy, Anemia, B12 deficiency, Anxiety, C. difficile colitis, CARPAL TUNNEL SYNDROME, BILATERAL (01/13/2010), Cervical lesion (07/13/2011), Chronic fatigue, CTS (carpal tunnel syndrome), Diabetes mellitus without complication ([place]), ESSENTIAL HYPERTENSION (03/13/2007), Fibromyalgia, GERD (gastroesophageal reflux disease), Heart murmur, adenomatous polyp of colon (06/09/2009), Hyperlipidemia, HYPERLIPIDEMIA (03/13/2007), Irritable bowel syndrome (05/24/2010), Multiple sclerosis ([place]), OBSTRUCTIVE SLEEP APNEA (12/08/2008), Sleep apnea, and VITAMIN D DEFICIENCY (01/13/2010).  Vitamin D deficiency Last vitamin D Lab Results  Component Value Date   VD25OH 72.21 05/07/2020   Stable, cont oral replacement   Vitamin B 12 deficiency Lab Results  Component Value Date   VITAMINB12 423 12/24/2020   Stable, cont oral replacement - b12 1000 mcg qd   Swelling of lower leg Chronic persistent, suspect mild lymphedema, for leg elevation and compression stockings during daytime  Hyperlipidemia Lab Results  Component Value Date   CHOL 156 12/24/2020   HDL 40.90 12/24/2020   LDLDIRECT 86.0 12/24/2020   TRIG 314.0 (H) 12/24/2020   CHOLHDL 4 12/24/2020   Mild uncontrolled, goal ldl < 70, declines change in statin for now lipitor 20,  to f/u any worsening symptoms or concerns  Essential hypertension BP Readings from Last 3 Encounters:  12/25/20 (!) 169/93  12/24/20 140/90  06/19/20 122/76   Uncontrolled, state BP at home < 140/90,, pt to continue medical treatment losartan, lopressor   Achilles tendonitis Mild to mod, for referral podiatry  Diabetes ([place]) Lab Results  Component Value Date   HGBA1C 8.1 (H) 12/24/2020   uncontrolled, pt to continue current medical treatment insluin and metofrmin, f/u endo as planned  Followup: Return in about 6 months (around 06/23/2021).  Cathlean Cower, MD 12/27/2020 9:25 PM [facility]

## 2020-12-24 NOTE — Patient Instructions (Addendum)
You had the flu shot today  Please consider Nurse visit after 2 wks for the Prevnar 20  Please consider wearing OTC compression stockings to both legs during the day only to help control the sweling  Please continue all other medications as before, and refills have been done if requested.  Please have the pharmacy call with any other refills you may need.  Please continue your efforts at being more active, low cholesterol diet, and weight control.  Please keep your appointments with your specialists as you may have planned  You will be contacted regarding the referral for: Dr Hyatt/podiatry  Please go to the LAB at the blood drawing area for the tests to be done  You will be contacted by phone if any changes need to be made immediately.  Otherwise, you will receive a letter about your results with an explanation, but please check with MyChart first.  Please remember to sign up for MyChart if you have not done so, as this will be important to you in the future with finding out test results, communicating by private email, and scheduling acute appointments online when needed.  Please make an Appointment to return in 6 months, or sooner if needed

## 2020-12-25 ENCOUNTER — Encounter: Payer: Self-pay | Admitting: Neurology

## 2020-12-25 ENCOUNTER — Encounter: Payer: Self-pay | Admitting: Internal Medicine

## 2020-12-25 ENCOUNTER — Ambulatory Visit (INDEPENDENT_AMBULATORY_CARE_PROVIDER_SITE_OTHER): Payer: Medicare Other | Admitting: Neurology

## 2020-12-25 ENCOUNTER — Other Ambulatory Visit: Payer: Medicare Other

## 2020-12-25 DIAGNOSIS — G35 Multiple sclerosis: Secondary | ICD-10-CM | POA: Diagnosis not present

## 2020-12-25 MED ORDER — ATORVASTATIN CALCIUM 40 MG PO TABS: 40.0000 mg | ORAL_TABLET | Freq: Every day | ORAL | 3 refills | Status: DC

## 2020-12-25 NOTE — Patient Instructions (Signed)
Check MRI of brain and cervical spine with and without contrast Continue Aubagio, vit D, gabapentin, baclofen, nortriptyline.  May stop Ampyra because I don't think it is likely doing anything at this point Repeat CBC w/diff, CMP and vit D in 6 months.   Follow up 6 months.  If MRI shows anything new, will have you follow up sooner

## 2020-12-27 ENCOUNTER — Encounter: Payer: Self-pay | Admitting: Internal Medicine

## 2020-12-27 NOTE — Assessment & Plan Note (Signed)
Mild to mod, for referral podiatry

## 2020-12-27 NOTE — Assessment & Plan Note (Signed)
Lab Results  Component Value Date   VITAMINB12 423 12/24/2020   Stable, cont oral replacement - b12 1000 mcg qd

## 2020-12-27 NOTE — Assessment & Plan Note (Signed)
Chronic persistent, suspect mild lymphedema, for leg elevation and compression stockings during daytime

## 2020-12-27 NOTE — Assessment & Plan Note (Signed)
Lab Results  Component Value Date   HGBA1C 8.1 (H) 12/24/2020   uncontrolled, pt to continue current medical treatment insluin and metofrmin, f/u endo as planned

## 2020-12-27 NOTE — Assessment & Plan Note (Addendum)
BP Readings from Last 3 Encounters:  12/25/20 (!) 169/93  12/24/20 140/90  06/19/20 122/76   Uncontrolled, state BP at home < 140/90,, pt to continue medical treatment losartan, lopressor

## 2020-12-27 NOTE — Assessment & Plan Note (Signed)
Lab Results  Component Value Date   CHOL 156 12/24/2020   HDL 40.90 12/24/2020   LDLDIRECT 86.0 12/24/2020   TRIG 314.0 (H) 12/24/2020   CHOLHDL 4 12/24/2020   Mild uncontrolled, goal ldl < 70, declines change in statin for now lipitor 20,  to f/u any worsening symptoms or concerns

## 2021-01-01 ENCOUNTER — Encounter: Payer: Self-pay | Admitting: Endocrinology

## 2021-01-04 ENCOUNTER — Other Ambulatory Visit: Payer: Self-pay

## 2021-01-04 ENCOUNTER — Ambulatory Visit (INDEPENDENT_AMBULATORY_CARE_PROVIDER_SITE_OTHER): Payer: Medicare Other | Admitting: Endocrinology

## 2021-01-04 VITALS — BP 150/96 | HR 83 | Ht 61.0 in | Wt 196.2 lb

## 2021-01-04 DIAGNOSIS — Z794 Long term (current) use of insulin: Secondary | ICD-10-CM | POA: Diagnosis not present

## 2021-01-04 DIAGNOSIS — E119 Type 2 diabetes mellitus without complications: Secondary | ICD-10-CM | POA: Diagnosis not present

## 2021-01-04 LAB — POCT GLYCOSYLATED HEMOGLOBIN (HGB A1C): Hemoglobin A1C: 8 % — AB (ref 4.0–5.6)

## 2021-01-04 MED ORDER — BASAGLAR KWIKPEN 100 UNIT/ML ~~LOC~~ SOPN: 5.0000 [IU] | PEN_INJECTOR | Freq: Every day | SUBCUTANEOUS | 3 refills | Status: DC

## 2021-01-04 MED ORDER — INSULIN LISPRO (1 UNIT DIAL) 100 UNIT/ML (KWIKPEN): PEN_INJECTOR | SUBCUTANEOUS | 3 refills | Status: DC

## 2021-01-04 MED ORDER — FREESTYLE LIBRE 2 SENSOR MISC: 1.0000 | 3 refills | Status: DC

## 2021-01-04 NOTE — Progress Notes (Signed)
Subjective:    Patient ID: Courtney [name], female    DOB: 09/03/1975, 45 y.o.   MRN: [id number]  HPI Pt returns for f/u of diabetes mellitus: DM type: Insulin-requiring type 2 Dx'ed: 2016, when she presented with nonketotic hyperosmolar hyperglycemic state, but not DKA.   Complications: none Therapy: insulin since 2018, and 2 oral meds.  GDM: never. DKA: never. Severe hypoglycemia: never.  Pancreatitis: never.   Other: she takes multiple daily injections; metformin dosage is limited by nausea.  She declines weight loss surgery; she did not tolerate Invokana (vaginitis); she did not qualify for continuous glucose monitor;   She declines pump, for now. Interval history: she brings a record of her cbg's which I have reviewed today.  cbg's vary from 150-265.  It is in general highest fasting.  No recent steroids.  She says she never misses meds, but she takes only 12 units of Humalog with supper.  She denies hypoglycemia.   Past Medical History:  Diagnosis Date   ALLERGIC RHINITIS 03/13/2007   Allergy    Anemia, B12 deficiency    Anxiety    C. difficile colitis    CARPAL TUNNEL SYNDROME, BILATERAL 01/13/2010   Cervical lesion 07/13/2011   Spine lesion - indeterminate, for f/u MRI may 2013 per neurology   Chronic fatigue    CTS (carpal tunnel syndrome)    Diabetes mellitus without complication ([place])    ESSENTIAL HYPERTENSION 03/13/2007   Fibromyalgia    GERD (gastroesophageal reflux disease)    Heart murmur    Hx of adenomatous polyp of colon 06/09/2009   Hyperlipidemia    HYPERLIPIDEMIA 03/13/2007   Irritable bowel syndrome 05/24/2010   Multiple sclerosis ([place])    OBSTRUCTIVE SLEEP APNEA 12/08/2008   Sleep apnea    uses c pap   VITAMIN D DEFICIENCY 01/13/2010    Past Surgical History:  Procedure Laterality Date   APPENDECTOMY  07/23/12   CARPAL TUNNEL RELEASE  01/2010   bilateral   COLONOSCOPY W/ BIOPSIES AND POLYPECTOMY  05/2009   2 tubular adenomas   LAPAROSCOPIC APPENDECTOMY  N/A 07/23/2012   Procedure: APPENDECTOMY LAPAROSCOPIC;  Surgeon: Gayland Curry, MD;  Location: [facility];  Service: General;  Laterality: N/A;    Social History   Socioeconomic History   Marital status: Single    Spouse name: Not on file   Number of children: 2   Years of education: Not on file   Highest education level: Not on file  Occupational History   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    Employer: UNEMPLOYED  Tobacco Use   Smoking status: Never   Smokeless tobacco: Never  Vaping Use   Vaping Use: Never used  Substance and Sexual Activity   Alcohol use: No    Alcohol/week: 0.0 standard drinks   Drug use: No   Sexual activity: Not on file  Other Topics Concern   Not on file  Social History Narrative   Daily Caffeine   Right handed   Two story home   Social Determinants of Health   Financial Resource Strain: Not on file  Food Insecurity: Not on file  Transportation Needs: Not on file  Physical Activity: Not on file  Stress: Not on file  Social Connections: Not on file  Intimate Partner Violence: Not on file    Current Outpatient Medications on File Prior to Visit  Medication Sig Dispense Refill   atorvastatin (LIPITOR) 40 MG tablet Take 1 tablet (40 mg total) by mouth daily. [place]  tablet 3   AUBAGIO 14 MG TABS Take 1 tablet by mouth daily. 90 tablet 1   B-D UF III MINI PEN NEEDLES 31G X 5 MM MISC USE TO INJECT HUMALOG KWIKPEN INTO THE SKIN THREE TIMES A DAY 90 each 0   baclofen (LIORESAL) 10 MG tablet TAKE 1 TABLET BY MOUTH 3  TIMES DAILY 270 tablet 0   cetirizine (ZYRTEC) 10 MG tablet Take 10 mg by mouth daily.       Cholecalciferol (VITAMIN D) 2000 UNITS CAPS Take 2 capsules by mouth every morning. Reported on 06/05/2015     clotrimazole (MYCELEX) 10 MG troche Take 1 tablet (10 mg total) by mouth 5 (five) times daily. 35 tablet 2   cyanocobalamin (,VITAMIN B-12,) 1000 MCG/ML injection Inject into the muscle every 30 (thirty) days.     famotidine (PEPCID) 20 MG tablet  Take 1 tablet (20 mg total) by mouth 2 (two) times daily. 180 tablet 3   furosemide (LASIX) 40 MG tablet Take 1 tablet (40 mg total) by mouth daily. 90 tablet 3   gabapentin (NEURONTIN) 300 MG capsule TAKE 2 CAPSULES BY MOUTH IN THE MORNING 1 CAPSULE BY  MOUTH IN THE AFTERNOON AND  2 CAPSULES BY MOUTH IN THE  EVENING 150 capsule 0   glucose blood (FREESTYLE TEST STRIPS) test strip 1 each by Other route 2 (two) times daily. And lancets, 2 times daily. 180 each 3   glucose monitoring kit (FREESTYLE) monitoring kit 1 each by Does not apply route as needed for other. Dispense any model that is covered- dispense testing supplies for Q AC/ HS accuchecks- 1 month supply with one refil. 1 each 1   loperamide (IMODIUM) 2 MG capsule Take 2 mg by mouth as needed for diarrhea or loose stools.     losartan (COZAAR) 100 MG tablet Take 1 tablet (100 mg total) by mouth daily. 90 tablet 3   medroxyPROGESTERone (DEPO-PROVERA) 150 MG/ML injection Inject 150 mg into the muscle every 3 (three) months.       metFORMIN (GLUCOPHAGE-XR) 500 MG 24 hr tablet Take 2 tablets (1,000 mg total) by mouth daily. 180 tablet 3   metoprolol tartrate (LOPRESSOR) 100 MG tablet Take 2 tablets (200 mg total) by mouth daily. 180 tablet 3   nortriptyline (PAMELOR) 50 MG capsule TAKE 2 CAPSULES BY MOUTH AT BEDTIME 180 capsule 0   omeprazole-sodium bicarbonate (ZEGERID) 40-1100 MG per capsule Take 1 capsule by mouth daily.       potassium chloride (KLOR-CON) 10 MEQ tablet Take 2 tablets (20 mEq total) by mouth daily. 180 tablet 3   Semaglutide (RYBELSUS) 14 MG TABS Take 14 mg by mouth daily. 90 tablet 3   traMADol (ULTRAM) 50 MG tablet Take 1 tablet (50 mg total) by mouth every 6 (six) hours as needed. 20 tablet 1   Current Facility-Administered Medications on File Prior to Visit  Medication Dose Route Frequency Provider Last Rate Last Admin   medroxyPROGESTERone Acetate SUSY 150 mg  150 mg Intramuscular Q90 days Biagio Borg, MD   150 mg at  10/07/20 1444    Allergies  Allergen Reactions   Ciprofloxacin In D5w Rash   Penicillins     REACTION: rash   Tricor [Fenofibrate] Rash    Family History  Problem Relation Age of Onset   Migraines Mother    Hypertension Father    COPD Father    Diabetes Father    Prostate cancer Maternal Uncle  prostate cancer   Lung cancer Maternal Grandmother    Stroke Maternal Grandfather    Heart attack Maternal Grandfather    Diabetes Other    Hypertension Other    Asthma Daughter    Colon cancer Neg Hx    Esophageal cancer Neg Hx    Rectal cancer Neg Hx    Stomach cancer Neg Hx     BP (!) 150/96 (BP Location: Right Arm, Patient Position: Sitting, Cuff Size: Large)   Pulse 83   Ht '5\' 1"'  (1.549 m)   Wt 196 lb 3.2 oz (89 kg)   SpO2 98%   BMI 37.07 kg/m    Review of Systems     Objective:   Physical Exam VITAL SIGNS:  See vs page GENERAL: no distress Pulses: dorsalis pedis intact bilat.   MSK: no deformity of the feet CV: 1+ bilat leg edema Skin:  no ulcer on the feet.  normal color and temp on the feet. Neuro: sensation is intact to touch on the feet.    Lab Results  Component Value Date   HGBA1C 8.1 (H) 12/24/2020      Assessment & Plan:  Insulin-requiring type 2 DM: uncontrolled.    Patient Instructions  Your blood pressure is high today.  Please see your primary care provider soon, to have it rechecked Please continue the same Humalog, and: I have sent a prescription to your pharmacy, to add Basaglar Please continue the same other medications.   check your blood sugar twice a day.  vary the time of day when you check, between before the 3 meals, and at bedtime.  also check if you have symptoms of your blood sugar being too high or too low.  please keep a record of the readings and bring it to your next appointment here (or you can bring the meter itself).  You can write it on any piece of paper.  please call us sooner if your blood sugar goes below 70, or  if you have a lot of readings over 200.   I have sent a prescription to a different pharmacy, for the continuous glucose monitor Please come back for a follow-up appointment in 2 months.

## 2021-01-04 NOTE — Patient Instructions (Addendum)
Your blood pressure is high today.  Please see your primary care provider soon, to have it rechecked Please continue the same Humalog, and: I have sent a prescription to your pharmacy, to add Basaglar Please continue the same other medications.   check your blood sugar twice a day.  vary the time of day when you check, between before the 3 meals, and at bedtime.  also check if you have symptoms of your blood sugar being too high or too low.  please keep a record of the readings and bring it to your next appointment here (or you can bring the meter itself).  You can write it on any piece of paper.  please call us sooner if your blood sugar goes below 70, or if you have a lot of readings over 200.   I have sent a prescription to a different pharmacy, for the continuous glucose monitor Please come back for a follow-up appointment in 2 months.

## 2021-01-06 ENCOUNTER — Other Ambulatory Visit: Payer: Self-pay | Admitting: Endocrinology

## 2021-01-06 MED ORDER — LANTUS SOLOSTAR 100 UNIT/ML ~~LOC~~ SOPN: 5.0000 [IU] | PEN_INJECTOR | Freq: Every day | SUBCUTANEOUS | 99 refills | Status: DC

## 2021-01-07 ENCOUNTER — Ambulatory Visit (INDEPENDENT_AMBULATORY_CARE_PROVIDER_SITE_OTHER): Payer: Medicare Other

## 2021-01-07 ENCOUNTER — Other Ambulatory Visit: Payer: Self-pay

## 2021-01-07 DIAGNOSIS — Z309 Encounter for contraceptive management, unspecified: Secondary | ICD-10-CM | POA: Diagnosis not present

## 2021-01-07 DIAGNOSIS — E538 Deficiency of other specified B group vitamins: Secondary | ICD-10-CM

## 2021-01-07 LAB — POCT URINE PREGNANCY: Preg Test, Ur: NEGATIVE

## 2021-01-07 MED ORDER — CYANOCOBALAMIN 1000 MCG/ML IJ SOLN
1000.0000 ug | Freq: Once | INTRAMUSCULAR | Status: AC
  Administered 2021-01-07: 1000 ug via INTRAMUSCULAR

## 2021-01-07 MED ORDER — MEDROXYPROGESTERONE ACETATE 150 MG/ML IM SUSP
150.0000 mg | Freq: Once | INTRAMUSCULAR | Status: AC
  Administered 2021-01-07: 150 mg via INTRAMUSCULAR

## 2021-01-07 NOTE — Progress Notes (Signed)
Vitamin B12 injection given today, patient tolerated well.  Pt arrived for Depo Provera injection.  Pt tolerated injection well in right gluteal. Last Depo Provera Given - 10/07/2020 Pt is within due dates. Pt should return between 12/8 and 12/22

## 2021-01-07 NOTE — Progress Notes (Signed)
Patient ID: LUV MISH, female   DOB: June 28, 1975, 45 y.o.   MRN: [id number] Medical screening examination/treatment/procedure(s) were performed by non-physician practitioner and as supervising physician I was immediately available for consultation/collaboration.  I agree with above. Cathlean Cower, MD

## 2021-01-09 ENCOUNTER — Encounter: Payer: Self-pay | Admitting: Endocrinology

## 2021-01-09 ENCOUNTER — Other Ambulatory Visit: Payer: Self-pay | Admitting: Neurology

## 2021-01-12 ENCOUNTER — Ambulatory Visit: Payer: Medicare Other | Admitting: Podiatry

## 2021-01-14 ENCOUNTER — Encounter: Payer: Self-pay | Admitting: Endocrinology

## 2021-01-15 ENCOUNTER — Other Ambulatory Visit: Payer: Self-pay

## 2021-01-15 DIAGNOSIS — Z794 Long term (current) use of insulin: Secondary | ICD-10-CM

## 2021-01-15 DIAGNOSIS — E119 Type 2 diabetes mellitus without complications: Secondary | ICD-10-CM

## 2021-01-15 MED ORDER — BD PEN NEEDLE MINI U/F 31G X 5 MM MISC: 5 refills | Status: DC

## 2021-01-19 ENCOUNTER — Other Ambulatory Visit: Payer: Self-pay | Admitting: Podiatry

## 2021-01-19 ENCOUNTER — Encounter: Payer: Self-pay | Admitting: Podiatry

## 2021-01-19 ENCOUNTER — Ambulatory Visit (INDEPENDENT_AMBULATORY_CARE_PROVIDER_SITE_OTHER): Payer: Medicare Other | Admitting: Podiatry

## 2021-01-19 ENCOUNTER — Other Ambulatory Visit: Payer: Self-pay

## 2021-01-19 ENCOUNTER — Ambulatory Visit (INDEPENDENT_AMBULATORY_CARE_PROVIDER_SITE_OTHER): Payer: Medicare Other

## 2021-01-19 DIAGNOSIS — M722 Plantar fascial fibromatosis: Secondary | ICD-10-CM

## 2021-01-19 DIAGNOSIS — M7662 Achilles tendinitis, left leg: Secondary | ICD-10-CM | POA: Diagnosis not present

## 2021-01-19 MED ORDER — MELOXICAM 15 MG PO TABS: 15.0000 mg | ORAL_TABLET | Freq: Every day | ORAL | 3 refills | Status: DC

## 2021-01-19 MED ORDER — DEXAMETHASONE SODIUM PHOSPHATE 120 MG/30ML IJ SOLN
2.0000 mg | Freq: Once | INTRAMUSCULAR | Status: AC
  Administered 2021-01-19: 2 mg via INTRA_ARTICULAR

## 2021-01-19 NOTE — Patient Instructions (Signed)

## 2021-01-20 ENCOUNTER — Other Ambulatory Visit: Payer: Self-pay

## 2021-01-20 DIAGNOSIS — G35 Multiple sclerosis: Secondary | ICD-10-CM

## 2021-01-20 MED ORDER — AUBAGIO 14 MG PO TABS: 1.0000 | ORAL_TABLET | Freq: Every day | ORAL | 1 refills | Status: DC

## 2021-01-20 NOTE — Progress Notes (Signed)
Subjective:  Patient ID: Courtney [name], female    DOB: 1975-06-27,  MRN: [id number] HPI Chief Complaint  Patient presents with   Foot Pain    Posterior heel left - aching x couple months, AM tightness, almost feels like a tearing sensation, noticeable knot, no treatment   New Patient (Initial Visit)    Est pt 08/2017   Diabetes    Last a1c was 8.1    45 y.o. female presents with the above complaint.   ROS: Denies fever chills nausea vomiting muscle aches pains calf pain back pain chest pain shortness of breath.  Past Medical History:  Diagnosis Date   ALLERGIC RHINITIS 03/13/2007   Allergy    Anemia, B12 deficiency    Anxiety    C. difficile colitis    CARPAL TUNNEL SYNDROME, BILATERAL 01/13/2010   Cervical lesion 07/13/2011   Spine lesion - indeterminate, for f/u MRI may 2013 per neurology   Chronic fatigue    CTS (carpal tunnel syndrome)    Diabetes mellitus without complication ([place] )    ESSENTIAL HYPERTENSION 03/13/2007   Fibromyalgia    GERD (gastroesophageal reflux disease)    Heart murmur    Hx of adenomatous polyp of colon 06/09/2009   Hyperlipidemia    HYPERLIPIDEMIA 03/13/2007   Irritable bowel syndrome 05/24/2010   Multiple sclerosis ([place])    OBSTRUCTIVE SLEEP APNEA 12/08/2008   Sleep apnea    uses c pap   VITAMIN D DEFICIENCY 01/13/2010   Past Surgical History:  Procedure Laterality Date   APPENDECTOMY  07/23/12   CARPAL TUNNEL RELEASE  01/2010   bilateral   COLONOSCOPY W/ BIOPSIES AND POLYPECTOMY  05/2009   2 tubular adenomas   LAPAROSCOPIC APPENDECTOMY N/A 07/23/2012   Procedure: APPENDECTOMY LAPAROSCOPIC;  Surgeon: Gayland Curry, MD;  Location: [facility];  Service: General;  Laterality: N/A;    Current Outpatient Medications:    meloxicam (MOBIC) 15 MG tablet, Take 1 tablet (15 mg total) by mouth daily., Disp: 30 tablet, Rfl: 3   atorvastatin (LIPITOR) 40 MG tablet, Take 1 tablet (40 mg total) by mouth daily., Disp: 90 tablet, Rfl: 3   AUBAGIO 14 MG TABS, Take  1 tablet by mouth daily., Disp: 90 tablet, Rfl: 1   baclofen (LIORESAL) 10 MG tablet, TAKE 1 TABLET BY MOUTH 3  TIMES DAILY, Disp: 270 tablet, Rfl: 0   cetirizine (ZYRTEC) 10 MG tablet, Take 10 mg by mouth daily.  , Disp: , Rfl:    Cholecalciferol (VITAMIN D) 2000 UNITS CAPS, Take 2 capsules by mouth every morning. Reported on 06/05/2015, Disp: , Rfl:    clotrimazole (MYCELEX) 10 MG troche, Take 1 tablet (10 mg total) by mouth 5 (five) times daily., Disp: 35 tablet, Rfl: 2   Continuous Blood Gluc Sensor (FREESTYLE LIBRE 2 SENSOR) MISC, 1 Device by Does not apply route every 14 (fourteen) days., Disp: 6 each, Rfl: 3   cyanocobalamin (,VITAMIN B-12,) 1000 MCG/ML injection, Inject into the muscle every 30 (thirty) days., Disp: , Rfl:    famotidine (PEPCID) 20 MG tablet, Take 1 tablet (20 mg total) by mouth 2 (two) times daily., Disp: 180 tablet, Rfl: 3   furosemide (LASIX) 40 MG tablet, Take 1 tablet (40 mg total) by mouth daily., Disp: 90 tablet, Rfl: 3   gabapentin (NEURONTIN) 300 MG capsule, TAKE 2 CAPSULES BY MOUTH IN THE MORNING 1 CAPSULE BY  MOUTH IN THE AFTERNOON AND  2 CAPSULES BY MOUTH IN THE  EVENING, Disp: 150 capsule, Rfl: 0  glucose blood (FREESTYLE TEST STRIPS) test strip, 1 each by Other route 2 (two) times daily. And lancets, 2 times daily., Disp: 180 each, Rfl: 3   glucose monitoring kit (FREESTYLE) monitoring kit, 1 each by Does not apply route as needed for other. Dispense any model that is covered- dispense testing supplies for Q AC/ HS accuchecks- 1 month supply with one refil., Disp: 1 each, Rfl: 1   insulin glargine (LANTUS SOLOSTAR) 100 UNIT/ML Solostar Pen, Inject 5 Units into the skin at bedtime., Disp: 15 mL, Rfl: PRN   insulin lispro (HUMALOG KWIKPEN) 100 UNIT/ML KwikPen, 3 times a day (just before each meal), 20-18-14 and pen needles 4/day., Disp: 60 mL, Rfl: 3   Insulin Pen Needle (B-D UF III MINI PEN NEEDLES) 31G X 5 MM MISC, USE TO INJECT HUMALOG KWIKPEN INTO THE SKIN THREE  TIMES A DAY, Disp: 90 each, Rfl: 5   loperamide (IMODIUM) 2 MG capsule, Take 2 mg by mouth as needed for diarrhea or loose stools., Disp: , Rfl:    losartan (COZAAR) 100 MG tablet, Take 1 tablet (100 mg total) by mouth daily., Disp: 90 tablet, Rfl: 3   medroxyPROGESTERone (DEPO-PROVERA) 150 MG/ML injection, Inject 150 mg into the muscle every 3 (three) months.  , Disp: , Rfl:    metFORMIN (GLUCOPHAGE-XR) 500 MG 24 hr tablet, Take 2 tablets (1,000 mg total) by mouth daily., Disp: 180 tablet, Rfl: 3   metoprolol tartrate (LOPRESSOR) 100 MG tablet, Take 2 tablets (200 mg total) by mouth daily., Disp: 180 tablet, Rfl: 3   nortriptyline (PAMELOR) 50 MG capsule, TAKE 2 CAPSULES BY MOUTH AT BEDTIME, Disp: 180 capsule, Rfl: 0   omeprazole-sodium bicarbonate (ZEGERID) 40-1100 MG per capsule, Take 1 capsule by mouth daily.  , Disp: , Rfl:    potassium chloride (KLOR-CON) 10 MEQ tablet, Take 2 tablets (20 mEq total) by mouth daily., Disp: 180 tablet, Rfl: 3   Semaglutide (RYBELSUS) 14 MG TABS, Take 14 mg by mouth daily., Disp: 90 tablet, Rfl: 3   traMADol (ULTRAM) 50 MG tablet, Take 1 tablet (50 mg total) by mouth every 6 (six) hours as needed., Disp: 20 tablet, Rfl: 1  Current Facility-Administered Medications:    medroxyPROGESTERone Acetate SUSY 150 mg, 150 mg, Intramuscular, Q90 days, Biagio Borg, MD, 150 mg at 10/07/20 1444  Allergies  Allergen Reactions   Ciprofloxacin In D5w Rash   Penicillins     REACTION: rash   Tricor [Fenofibrate] Rash   Review of Systems Objective:  There were no vitals filed for this visit.  General: Well developed, nourished, in no acute distress, alert and oriented x3   Dermatological: Skin is warm, dry and supple bilateral. Nails x 10 are well maintained; remaining integument appears unremarkable at this time. There are no open sores, no preulcerative lesions, no rash or signs of infection present.  Vascular: Dorsalis Pedis artery and Posterior Tibial artery pedal  pulses are 2/4 bilateral with immedate capillary fill time. Pedal hair growth present. No varicosities and no lower extremity edema present bilateral.   Neruologic: Grossly intact via light touch bilateral. Vibratory intact via tuning fork bilateral. Protective threshold with Semmes Wienstein monofilament intact to all pedal sites bilateral. Patellar and Achilles deep tendon reflexes 2+ bilateral. No Babinski or clonus noted bilateral.   Musculoskeletal: No gross boney pedal deformities bilateral. No pain, crepitus, or limitation noted with foot and ankle range of motion bilateral. Muscular strength 5/5 in all groups tested bilateral.  Significant pain on palpation of  the posterior aspect of the Achilles mild equinus is noted at the level of the ankle.  Gait: Unassisted, Nonantalgic.    Radiographs:  Radiographs taken today demonstrate significant spurring posteriorly with significant thickening of the Achilles tendinous insertion site on this heel.  Assessment & Plan:   Assessment: Achilles tendinitis.  Plan: Injected 2 mg of dexamethasone and local anesthetic subcutaneously overlying the Achilles tendon at the point of maximal tenderness.  Making sure not to inject into the tendon itself.  I also started her meloxicam.  Placed her in a boot that she has at home.  And I want to follow-up with her in 1 month.     Nychelle Cassata T. [place], [place]

## 2021-01-20 NOTE — Telephone Encounter (Signed)
Medication refilled

## 2021-01-22 ENCOUNTER — Ambulatory Visit
Admission: RE | Admit: 2021-01-22 | Discharge: 2021-01-22 | Disposition: A | Discharge status: Home / Self Care | Payer: Medicare Other | Source: Ambulatory Visit | Attending: Neurology | Admitting: Neurology

## 2021-01-22 ENCOUNTER — Other Ambulatory Visit: Payer: Self-pay

## 2021-01-22 DIAGNOSIS — G35 Multiple sclerosis: Secondary | ICD-10-CM

## 2021-01-22 DIAGNOSIS — G939 Disorder of brain, unspecified: Secondary | ICD-10-CM | POA: Diagnosis not present

## 2021-01-22 DIAGNOSIS — M5023 Other cervical disc displacement, cervicothoracic region: Secondary | ICD-10-CM | POA: Diagnosis not present

## 2021-01-22 DIAGNOSIS — K110 Atrophy of salivary gland: Secondary | ICD-10-CM | POA: Diagnosis not present

## 2021-01-22 DIAGNOSIS — S22009A Unspecified fracture of unspecified thoracic vertebra, initial encounter for closed fracture: Secondary | ICD-10-CM | POA: Diagnosis not present

## 2021-01-22 DIAGNOSIS — I629 Nontraumatic intracranial hemorrhage, unspecified: Secondary | ICD-10-CM | POA: Diagnosis not present

## 2021-01-22 DIAGNOSIS — M50222 Other cervical disc displacement at C5-C6 level: Secondary | ICD-10-CM | POA: Diagnosis not present

## 2021-01-22 MED ORDER — GADOBENATE DIMEGLUMINE 529 MG/ML IV SOLN
18.0000 mL | Freq: Once | INTRAVENOUS | Status: AC | PRN
  Administered 2021-01-22: 18 mL via INTRAVENOUS

## 2021-01-25 NOTE — Progress Notes (Signed)
MRIs are stable.  No evidence of active MS or progression of MS.

## 2021-01-28 NOTE — Progress Notes (Signed)
Pt advised of her MRI results.

## 2021-02-01 DIAGNOSIS — E119 Type 2 diabetes mellitus without complications: Secondary | ICD-10-CM | POA: Diagnosis not present

## 2021-02-01 DIAGNOSIS — Z794 Long term (current) use of insulin: Secondary | ICD-10-CM | POA: Diagnosis not present

## 2021-02-04 ENCOUNTER — Telehealth: Payer: Self-pay | Admitting: Pharmacy Technician

## 2021-02-04 NOTE — Telephone Encounter (Addendum)
[facility] Patient Advocate Encounter  Received notification from [facility] that Prior Authorization for [employer] has been approved.    Dated 02/01/21 pt should receive equipment and supplies within 3-5 days.   Armanda Magic, CPhT [facility] Phone: [phone] Fax:  [phone]

## 2021-02-07 ENCOUNTER — Other Ambulatory Visit: Payer: Self-pay | Admitting: Neurology

## 2021-02-10 ENCOUNTER — Ambulatory Visit (INDEPENDENT_AMBULATORY_CARE_PROVIDER_SITE_OTHER): Payer: Medicare Other

## 2021-02-10 ENCOUNTER — Other Ambulatory Visit: Payer: Self-pay

## 2021-02-10 DIAGNOSIS — E538 Deficiency of other specified B group vitamins: Secondary | ICD-10-CM | POA: Diagnosis not present

## 2021-02-10 MED ORDER — CYANOCOBALAMIN 1000 MCG/ML IJ SOLN
1000.0000 ug | Freq: Once | INTRAMUSCULAR | Status: AC
  Administered 2021-02-10: 1000 ug via INTRAMUSCULAR

## 2021-02-10 NOTE — Progress Notes (Signed)
Pt given B12 injection w/o any complications. 

## 2021-02-18 ENCOUNTER — Ambulatory Visit: Payer: Medicare Other | Admitting: Podiatry

## 2021-02-18 ENCOUNTER — Other Ambulatory Visit: Payer: Self-pay | Admitting: Neurology

## 2021-02-25 ENCOUNTER — Other Ambulatory Visit: Payer: Self-pay | Admitting: Neurology

## 2021-03-04 ENCOUNTER — Encounter: Payer: Self-pay | Admitting: Endocrinology

## 2021-03-05 ENCOUNTER — Ambulatory Visit (INDEPENDENT_AMBULATORY_CARE_PROVIDER_SITE_OTHER): Payer: Medicare Other | Admitting: Endocrinology

## 2021-03-05 ENCOUNTER — Other Ambulatory Visit: Payer: Self-pay

## 2021-03-05 VITALS — BP 160/90 | HR 88 | Ht 62.0 in | Wt 201.4 lb

## 2021-03-05 DIAGNOSIS — E119 Type 2 diabetes mellitus without complications: Secondary | ICD-10-CM

## 2021-03-05 DIAGNOSIS — Z794 Long term (current) use of insulin: Secondary | ICD-10-CM | POA: Diagnosis not present

## 2021-03-05 LAB — POCT GLYCOSYLATED HEMOGLOBIN (HGB A1C): Hemoglobin A1C: 8.7 % — AB (ref 4.0–5.6)

## 2021-03-05 MED ORDER — LANTUS SOLOSTAR 100 UNIT/ML ~~LOC~~ SOPN: 15.0000 [IU] | PEN_INJECTOR | Freq: Every day | SUBCUTANEOUS | 99 refills | Status: DC

## 2021-03-05 NOTE — Progress Notes (Signed)
Subjective:    Patient ID: Courtney [name], female    DOB: Jun 15, 1975, 45 y.o.   MRN: [id number]  HPI Pt returns for f/u of diabetes mellitus: DM type: Insulin-requiring type 2 Dx'ed: 2016, when she presented with nonketotic hyperosmolar hyperglycemic state, but not DKA.   Complications: none Therapy: insulin since 2018, and 2 oral meds.  GDM: never. DKA: never. Severe hypoglycemia: never.  Pancreatitis: never.   Other: she takes multiple daily injections; metformin dosage is limited by nausea.  She declines weight loss surgery; she did not tolerate Invokana (vaginitis); She declines pump, for now.  She uses [place] CGM.   Interval history: I reviewed continuous glucose monitor data.  Glucose varies from 160-300.  It is in general highest at [facility].  Otherwise, There is little trend throughout the day.  No recent steroids.  She denies hypoglycemia.  She says bp is normal at home.   Past Medical History:  Diagnosis Date   ALLERGIC RHINITIS 03/13/2007   Allergy    Anemia, B12 deficiency    Anxiety    C. difficile colitis    CARPAL TUNNEL SYNDROME, BILATERAL 01/13/2010   Cervical lesion 07/13/2011   Spine lesion - indeterminate, for f/u MRI may 2013 per neurology   Chronic fatigue    CTS (carpal tunnel syndrome)    Diabetes mellitus without complication ([place])    ESSENTIAL HYPERTENSION 03/13/2007   Fibromyalgia    GERD (gastroesophageal reflux disease)    Heart murmur    Hx of adenomatous polyp of colon 06/09/2009   Hyperlipidemia    HYPERLIPIDEMIA 03/13/2007   Irritable bowel syndrome 05/24/2010   Multiple sclerosis ([place])    OBSTRUCTIVE SLEEP APNEA 12/08/2008   Sleep apnea    uses c pap   VITAMIN D DEFICIENCY 01/13/2010    Past Surgical History:  Procedure Laterality Date   APPENDECTOMY  07/23/12   CARPAL TUNNEL RELEASE  01/2010   bilateral   COLONOSCOPY W/ BIOPSIES AND POLYPECTOMY  05/2009   2 tubular adenomas   LAPAROSCOPIC APPENDECTOMY N/A 07/23/2012   Procedure: APPENDECTOMY  LAPAROSCOPIC;  Surgeon: Gayland Curry, MD;  Location: [facility] OR;  Service: General;  Laterality: N/A;    Social History   Socioeconomic History   Marital status: Single    Spouse name: Not on file   Number of children: 2   Years of education: Not on file   Highest education level: Not on file  Occupational History   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    Employer: UNEMPLOYED  Tobacco Use   Smoking status: Never   Smokeless tobacco: Never  Vaping Use   Vaping Use: Never used  Substance and Sexual Activity   Alcohol use: No    Alcohol/week: 0.0 standard drinks   Drug use: No   Sexual activity: Not on file  Other Topics Concern   Not on file  Social History Narrative   Daily Caffeine   Right handed   Two story home   Social Determinants of Health   Financial Resource Strain: Not on file  Food Insecurity: Not on file  Transportation Needs: Not on file  Physical Activity: Not on file  Stress: Not on file  Social Connections: Not on file  Intimate Partner Violence: Not on file    Current Outpatient Medications on File Prior to Visit  Medication Sig Dispense Refill   atorvastatin (LIPITOR) 40 MG tablet Take 1 tablet (40 mg total) by mouth daily. 90 tablet 3   AUBAGIO 14 MG TABS  Take 1 tablet by mouth daily. 90 tablet 1   baclofen (LIORESAL) 10 MG tablet TAKE 1 TABLET BY MOUTH 3  TIMES DAILY 270 tablet 0   cetirizine (ZYRTEC) 10 MG tablet Take 10 mg by mouth daily.       Cholecalciferol (VITAMIN D) 2000 UNITS CAPS Take 2 capsules by mouth every morning. Reported on 06/05/2015     Continuous Blood Gluc Sensor (FREESTYLE LIBRE 2 SENSOR) MISC 1 Device by Does not apply route every 14 (fourteen) days. 6 each 3   cyanocobalamin (,VITAMIN B-12,) 1000 MCG/ML injection Inject into the muscle every 30 (thirty) days.     famotidine (PEPCID) 20 MG tablet Take 1 tablet (20 mg total) by mouth 2 (two) times daily. 180 tablet 3   furosemide (LASIX) 40 MG tablet Take 1 tablet (40 mg total) by  mouth daily. 90 tablet 3   gabapentin (NEURONTIN) 300 MG capsule TAKE 2 CAPSULES BY MOUTH IN THE MORNING 1 CAPSULE BY  MOUTH IN THE AFTERNOON AND  2 CAPSULES BY MOUTH IN THE  EVENING 150 capsule 0   glucose blood (FREESTYLE TEST STRIPS) test strip 1 each by Other route 2 (two) times daily. And lancets, 2 times daily. 180 each 3   glucose monitoring kit (FREESTYLE) monitoring kit 1 each by Does not apply route as needed for other. Dispense any model that is covered- dispense testing supplies for Q AC/ HS accuchecks- 1 month supply with one refil. 1 each 1   insulin lispro (HUMALOG KWIKPEN) 100 UNIT/ML KwikPen 3 times a day (just before each meal), 20-18-14 and pen needles 4/day. 60 mL 3   Insulin Pen Needle (B-D UF III MINI PEN NEEDLES) 31G X 5 MM MISC USE TO INJECT HUMALOG KWIKPEN INTO THE SKIN THREE TIMES A DAY 90 each 5   loperamide (IMODIUM) 2 MG capsule Take 2 mg by mouth as needed for diarrhea or loose stools.     losartan (COZAAR) 100 MG tablet Take 1 tablet (100 mg total) by mouth daily. 90 tablet 3   medroxyPROGESTERone (DEPO-PROVERA) 150 MG/ML injection Inject 150 mg into the muscle every 3 (three) months.       meloxicam (MOBIC) 15 MG tablet Take 1 tablet (15 mg total) by mouth daily. 30 tablet 3   metFORMIN (GLUCOPHAGE-XR) 500 MG 24 hr tablet Take 2 tablets (1,000 mg total) by mouth daily. 180 tablet 3   metoprolol tartrate (LOPRESSOR) 100 MG tablet Take 2 tablets (200 mg total) by mouth daily. 180 tablet 3   nortriptyline (PAMELOR) 50 MG capsule TAKE 2 CAPSULES BY MOUTH AT BEDTIME 180 capsule 3   omeprazole-sodium bicarbonate (ZEGERID) 40-1100 MG per capsule Take 1 capsule by mouth daily.       potassium chloride (KLOR-CON) 10 MEQ tablet Take 2 tablets (20 mEq total) by mouth daily. 180 tablet 3   Semaglutide (RYBELSUS) 14 MG TABS Take 14 mg by mouth daily. 90 tablet 3   traMADol (ULTRAM) 50 MG tablet Take 1 tablet (50 mg total) by mouth every 6 (six) hours as needed. 20 tablet 1    Current Facility-Administered Medications on File Prior to Visit  Medication Dose Route Frequency Provider Last Rate Last Admin   medroxyPROGESTERone Acetate SUSY 150 mg  150 mg Intramuscular Q90 days Biagio Borg, MD   150 mg at 10/07/20 1444    Allergies  Allergen Reactions   Ciprofloxacin In D5w Rash   Penicillins     REACTION: rash   Tricor [Fenofibrate] Rash  Family History  Problem Relation Age of Onset   Migraines Mother    Hypertension Father    COPD Father    Diabetes Father    Prostate cancer Maternal Uncle        prostate cancer   Lung cancer Maternal Grandmother    Stroke Maternal Grandfather    Heart attack Maternal Grandfather    Diabetes Other    Hypertension Other    Asthma Daughter    Colon cancer Neg Hx    Esophageal cancer Neg Hx    Rectal cancer Neg Hx    Stomach cancer Neg Hx     BP (!) 160/90 (BP Location: Right Arm, Patient Position: Sitting, Cuff Size: Large)   Pulse 88   Ht '5\' 2"'  (1.575 m)   Wt 201 lb 6.4 oz (91.4 kg)   SpO2 99%   BMI 36.84 kg/m    Review of Systems     Objective:   Physical Exam    Lab Results  Component Value Date   CREATININE 0.87 12/24/2020   BUN 9 12/24/2020   NA 138 12/24/2020   K 4.2 12/24/2020   CL 102 12/24/2020   CO2 28 12/24/2020      Assessment & Plan:  Insulin-requiring type 2 DM: uncontrolled.    Patient Instructions  I have sent a prescription to your pharmacy, to increase the Lantus to 15 units at bedtime.  Please continue the same other medications.   check your blood sugar twice a day.  vary the time of day when you check, between before the 3 meals, and at bedtime.  also check if you have symptoms of your blood sugar being too high or too low.  please keep a record of the readings and bring it to your next appointment here (or you can bring the meter itself).  You can write it on any piece of paper.  please call us sooner if your blood sugar goes below 70, or if you have a lot of  readings over 200.   Please come back for a follow-up appointment in 2 months.

## 2021-03-05 NOTE — Patient Instructions (Addendum)
I have sent a prescription to your pharmacy, to increase the Lantus to 15 units at bedtime.  Please continue the same other medications.   check your blood sugar twice a day.  vary the time of day when you check, between before the 3 meals, and at bedtime.  also check if you have symptoms of your blood sugar being too high or too low.  please keep a record of the readings and bring it to your next appointment here (or you can bring the meter itself).  You can write it on any piece of paper.  please call us sooner if your blood sugar goes below 70, or if you have a lot of readings over 200.   Please come back for a follow-up appointment in 2 months.

## 2021-03-15 ENCOUNTER — Ambulatory Visit (INDEPENDENT_AMBULATORY_CARE_PROVIDER_SITE_OTHER): Payer: Medicare Other

## 2021-03-15 ENCOUNTER — Other Ambulatory Visit: Payer: Self-pay

## 2021-03-15 DIAGNOSIS — E538 Deficiency of other specified B group vitamins: Secondary | ICD-10-CM | POA: Diagnosis not present

## 2021-03-15 MED ORDER — CYANOCOBALAMIN 1000 MCG/ML IJ SOLN
1000.0000 ug | Freq: Once | INTRAMUSCULAR | Status: AC
  Administered 2021-03-15: 10:00:00 1000 ug via INTRAMUSCULAR

## 2021-03-15 NOTE — Progress Notes (Signed)
Pt was given B12 w/o any complications. 

## 2021-03-21 ENCOUNTER — Other Ambulatory Visit: Payer: Self-pay | Admitting: Neurology

## 2021-04-07 ENCOUNTER — Other Ambulatory Visit: Payer: Self-pay

## 2021-04-07 ENCOUNTER — Ambulatory Visit (INDEPENDENT_AMBULATORY_CARE_PROVIDER_SITE_OTHER): Payer: Medicare Other

## 2021-04-07 DIAGNOSIS — E538 Deficiency of other specified B group vitamins: Secondary | ICD-10-CM

## 2021-04-07 DIAGNOSIS — Z309 Encounter for contraceptive management, unspecified: Secondary | ICD-10-CM

## 2021-04-07 MED ORDER — MEDROXYPROGESTERONE ACETATE 150 MG/ML IM SUSP
150.0000 mg | Freq: Once | INTRAMUSCULAR | Status: AC
  Administered 2021-04-07: 11:00:00 150 mg via INTRAMUSCULAR

## 2021-04-07 MED ORDER — CYANOCOBALAMIN 1000 MCG/ML IJ SOLN
1000.0000 ug | Freq: Once | INTRAMUSCULAR | Status: AC
Start: 2021-04-07 — End: 2021-04-07
  Administered 2021-04-07: 11:00:00 1000 ug via INTRAMUSCULAR

## 2021-04-07 NOTE — Progress Notes (Signed)
Pt given J00 w/o any complications.  Pt was given MedroxyPROGESTERone w/o any complications.

## 2021-04-23 DIAGNOSIS — Z794 Long term (current) use of insulin: Secondary | ICD-10-CM | POA: Diagnosis not present

## 2021-04-28 ENCOUNTER — Other Ambulatory Visit: Payer: Self-pay | Admitting: Internal Medicine

## 2021-04-28 ENCOUNTER — Other Ambulatory Visit: Payer: Self-pay | Admitting: Endocrinology

## 2021-04-28 NOTE — Telephone Encounter (Signed)
Please refill as per office routine med refill policy (all routine meds to be refilled for 3 mo or monthly (per pt preference) up to one year from last visit, then month to month grace period for 3 mo, then further med refills will have to be denied) ? ?

## 2021-05-07 ENCOUNTER — Other Ambulatory Visit: Payer: Self-pay

## 2021-05-07 ENCOUNTER — Ambulatory Visit (INDEPENDENT_AMBULATORY_CARE_PROVIDER_SITE_OTHER): Payer: Medicare Other | Admitting: Endocrinology

## 2021-05-07 VITALS — BP 192/100 | HR 74 | Ht 62.0 in | Wt 206.8 lb

## 2021-05-07 DIAGNOSIS — E119 Type 2 diabetes mellitus without complications: Secondary | ICD-10-CM | POA: Diagnosis not present

## 2021-05-07 DIAGNOSIS — Z794 Long term (current) use of insulin: Secondary | ICD-10-CM

## 2021-05-07 LAB — POCT GLYCOSYLATED HEMOGLOBIN (HGB A1C): Hemoglobin A1C: 8.4 % — AB (ref 4.0–5.6)

## 2021-05-07 MED ORDER — INSULIN LISPRO (1 UNIT DIAL) 100 UNIT/ML (KWIKPEN): PEN_INJECTOR | SUBCUTANEOUS | 3 refills | Status: DC

## 2021-05-07 MED ORDER — LANTUS SOLOSTAR 100 UNIT/ML ~~LOC~~ SOPN: 20.0000 [IU] | PEN_INJECTOR | Freq: Every day | SUBCUTANEOUS | 99 refills | Status: DC

## 2021-05-07 NOTE — Progress Notes (Signed)
Subjective:    Patient ID: Courtney [name], female    DOB: 1975-04-22, 46 y.o.   MRN: [id number]  HPI Pt returns for f/u of diabetes mellitus: DM type: Insulin-requiring type 2 Dx'ed: 2016, when she presented with nonketotic hyperosmolar hyperglycemic state, but not DKA.   Complications: none Therapy: insulin since 2018, and 2 oral meds.  GDM: never. DKA: never. Severe hypoglycemia: never.  Pancreatitis: never.   Other: she takes multiple daily injections; metformin dosage is limited by nausea.  She declines weight loss surgery; she did not tolerate Invokana (vaginitis); She declines pump, for now.  She uses [place] CGM; she eats meals at 9AM, 1PM, and 6PM.   Interval history: I reviewed continuous glucose monitor data.  Glucose varies from 150-350.  It is in general highest at [place], and lowest at 12[place].  Otherwise, There is little trend throughout the day.  No recent steroids.  She denies hypoglycemia.  She again says bp is normal at home.   Past Medical History:  Diagnosis Date   ALLERGIC RHINITIS 03/13/2007   Allergy    Anemia, B12 deficiency    Anxiety    C. difficile colitis    CARPAL TUNNEL SYNDROME, BILATERAL 01/13/2010   Cervical lesion 07/13/2011   Spine lesion - indeterminate, for f/u MRI may 2013 per neurology   Chronic fatigue    CTS (carpal tunnel syndrome)    Diabetes mellitus without complication ([place])    ESSENTIAL HYPERTENSION 03/13/2007   Fibromyalgia    GERD (gastroesophageal reflux disease)    Heart murmur    Hx of adenomatous polyp of colon 06/09/2009   Hyperlipidemia    HYPERLIPIDEMIA 03/13/2007   Irritable bowel syndrome 05/24/2010   Multiple sclerosis ([place])    OBSTRUCTIVE SLEEP APNEA 12/08/2008   Sleep apnea    uses c pap   VITAMIN D DEFICIENCY 01/13/2010    Past Surgical History:  Procedure Laterality Date   APPENDECTOMY  07/23/12   CARPAL TUNNEL RELEASE  01/2010   bilateral   COLONOSCOPY W/ BIOPSIES AND POLYPECTOMY  05/2009   2 tubular adenomas    LAPAROSCOPIC APPENDECTOMY N/A 07/23/2012   Procedure: APPENDECTOMY LAPAROSCOPIC;  Surgeon: Gayland Curry, MD;  Location: [facility] OR;  Service: General;  Laterality: N/A;    Social History   Socioeconomic History   Marital status: Single    Spouse name: Not on file   Number of children: 2   Years of education: Not on file   Highest education level: Not on file  Occupational History   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    Employer: UNEMPLOYED  Tobacco Use   Smoking status: Never   Smokeless tobacco: Never  Vaping Use   Vaping Use: Never used  Substance and Sexual Activity   Alcohol use: No    Alcohol/week: 0.0 standard drinks   Drug use: No   Sexual activity: Not on file  Other Topics Concern   Not on file  Social History Narrative   Daily Caffeine   Right handed   Two story home   Social Determinants of Health   Financial Resource Strain: Not on file  Food Insecurity: Not on file  Transportation Needs: Not on file  Physical Activity: Not on file  Stress: Not on file  Social Connections: Not on file  Intimate Partner Violence: Not on file    Current Outpatient Medications on File Prior to Visit  Medication Sig Dispense Refill   atorvastatin (LIPITOR) 40 MG tablet Take 1 tablet (40 mg  total) by mouth daily. 90 tablet 3   AUBAGIO 14 MG TABS Take 1 tablet by mouth daily. 90 tablet 1   baclofen (LIORESAL) 10 MG tablet TAKE 1 TABLET BY MOUTH 3  TIMES DAILY 270 tablet 0   cetirizine (ZYRTEC) 10 MG tablet Take 10 mg by mouth daily.       Cholecalciferol (VITAMIN D) 2000 UNITS CAPS Take 2 capsules by mouth every morning. Reported on 06/05/2015     Continuous Blood Gluc Sensor (FREESTYLE LIBRE 2 SENSOR) MISC 1 Device by Does not apply route every 14 (fourteen) days. 6 each 3   cyanocobalamin (,VITAMIN B-12,) 1000 MCG/ML injection Inject into the muscle every 30 (thirty) days.     famotidine (PEPCID) 20 MG tablet TAKE 1 TABLET BY MOUTH  TWICE DAILY 180 tablet 3   furosemide (LASIX)  40 MG tablet Take 1 tablet (40 mg total) by mouth daily. 90 tablet 3   gabapentin (NEURONTIN) 300 MG capsule TAKE 2 CAPSULES BY MOUTH IN THE  MORNING 1 CAPSULE BY MOUTH IN  THE AFTERNOON AND 2 CAPSULES BY  MOUTH IN THE EVENING 150 capsule 2   glucose blood (FREESTYLE TEST STRIPS) test strip 1 each by Other route 2 (two) times daily. And lancets, 2 times daily. 180 each 3   glucose monitoring kit (FREESTYLE) monitoring kit 1 each by Does not apply route as needed for other. Dispense any model that is covered- dispense testing supplies for Q AC/ HS accuchecks- 1 month supply with one refil. 1 each 1   Insulin Pen Needle (B-D UF III MINI PEN NEEDLES) 31G X 5 MM MISC USE TO INJECT HUMALOG KWIKPEN INTO THE SKIN THREE TIMES A DAY 90 each 5   loperamide (IMODIUM) 2 MG capsule Take 2 mg by mouth as needed for diarrhea or loose stools.     losartan (COZAAR) 100 MG tablet TAKE 1 TABLET BY MOUTH  DAILY 90 tablet 3   medroxyPROGESTERone (DEPO-PROVERA) 150 MG/ML injection Inject 150 mg into the muscle every 3 (three) months.       meloxicam (MOBIC) 15 MG tablet Take 1 tablet (15 mg total) by mouth daily. 30 tablet 3   metFORMIN (GLUCOPHAGE-XR) 500 MG 24 hr tablet TAKE 2 TABLETS BY MOUTH  DAILY 180 tablet 3   metoprolol tartrate (LOPRESSOR) 100 MG tablet TAKE 2 TABLETS BY MOUTH  DAILY 180 tablet 3   nortriptyline (PAMELOR) 50 MG capsule TAKE 2 CAPSULES BY MOUTH AT BEDTIME 180 capsule 3   omeprazole-sodium bicarbonate (ZEGERID) 40-1100 MG per capsule Take 1 capsule by mouth daily.       potassium chloride (KLOR-CON) 10 MEQ tablet TAKE 2 TABLETS BY MOUTH  DAILY 180 tablet 3   RYBELSUS 14 MG TABS TAKE 1 TABLET BY MOUTH  DAILY 90 tablet 3   traMADol (ULTRAM) 50 MG tablet Take 1 tablet (50 mg total) by mouth every 6 (six) hours as needed. 20 tablet 1   Current Facility-Administered Medications on File Prior to Visit  Medication Dose Route Frequency Provider Last Rate Last Admin   medroxyPROGESTERone Acetate SUSY 150  mg  150 mg Intramuscular Q90 days Biagio Borg, MD   150 mg at 10/07/20 1444    Allergies  Allergen Reactions   Ciprofloxacin In D5w Rash   Penicillins     REACTION: rash   Tricor [Fenofibrate] Rash    Family History  Problem Relation Age of Onset   Migraines Mother    Hypertension Father    COPD Father  Diabetes Father    Prostate cancer Maternal Uncle        prostate cancer   Lung cancer Maternal Grandmother    Stroke Maternal Grandfather    Heart attack Maternal Grandfather    Diabetes Other    Hypertension Other    Asthma Daughter    Colon cancer Neg Hx    Esophageal cancer Neg Hx    Rectal cancer Neg Hx    Stomach cancer Neg Hx     BP (!) 192/100    Pulse 74    Ht '5\' 2"'  (1.575 m)    Wt 206 lb 12.8 oz (93.8 kg)    SpO2 98%    BMI 37.82 kg/m    Review of Systems     Objective:   Physical Exam VITAL SIGNS:  See vs page.   GENERAL: no distress.     Lab Results  Component Value Date   HGBA1C 8.4 (A) 05/07/2021      Assessment & Plan:  Insulin-requiring type 2 DM: uncontrolled  Patient Instructions  I have sent a prescription to your pharmacy, to increase both insulins, as below Please continue the same other medications.   check your blood sugar twice a day.  vary the time of day when you check, between before the 3 meals, and at bedtime.  also check if you have symptoms of your blood sugar being too high or too low.  please keep a record of the readings and bring it to your next appointment here (or you can bring the meter itself).  You can write it on any piece of paper.  please call us sooner if your blood sugar goes below 70, or if you have a lot of readings over 200.   Please come back for a follow-up appointment in 2 months.

## 2021-05-07 NOTE — Patient Instructions (Addendum)
I have sent a prescription to your pharmacy, to increase both insulins, as below Please continue the same other medications.   check your blood sugar twice a day.  vary the time of day when you check, between before the 3 meals, and at bedtime.  also check if you have symptoms of your blood sugar being too high or too low.  please keep a record of the readings and bring it to your next appointment here (or you can bring the meter itself).  You can write it on any piece of paper.  please call us sooner if your blood sugar goes below 70, or if you have a lot of readings over 200.   Please come back for a follow-up appointment in 2 months.

## 2021-05-13 ENCOUNTER — Ambulatory Visit: Payer: Medicare Other

## 2021-05-13 ENCOUNTER — Encounter: Payer: Self-pay | Admitting: Endocrinology

## 2021-05-14 ENCOUNTER — Other Ambulatory Visit: Payer: Self-pay

## 2021-05-14 ENCOUNTER — Ambulatory Visit (INDEPENDENT_AMBULATORY_CARE_PROVIDER_SITE_OTHER): Payer: Medicare Other | Admitting: *Deleted

## 2021-05-14 DIAGNOSIS — E538 Deficiency of other specified B group vitamins: Secondary | ICD-10-CM | POA: Diagnosis not present

## 2021-05-14 MED ORDER — CYANOCOBALAMIN 1000 MCG/ML IJ SOLN
1000.0000 ug | Freq: Once | INTRAMUSCULAR | Status: AC
  Administered 2021-05-14: 1000 ug via INTRAMUSCULAR

## 2021-05-14 NOTE — Progress Notes (Signed)
Pls cosign for B12 inj../lmb  

## 2021-05-28 ENCOUNTER — Other Ambulatory Visit: Payer: Self-pay | Admitting: *Deleted

## 2021-05-28 MED ORDER — MELOXICAM 15 MG PO TABS: 15.0000 mg | ORAL_TABLET | Freq: Every day | ORAL | 3 refills | Status: DC

## 2021-05-28 NOTE — Telephone Encounter (Signed)
Med refill sent to [facility] - meloxicam #[phone]

## 2021-05-29 ENCOUNTER — Other Ambulatory Visit: Payer: Self-pay | Admitting: Internal Medicine

## 2021-05-29 NOTE — Telephone Encounter (Signed)
Please refill as per office routine med refill policy (all routine meds to be refilled for 3 mo or monthly (per pt preference) up to one year from last visit, then month to month grace period for 3 mo, then further med refills will have to be denied) ? ?

## 2021-06-13 ENCOUNTER — Other Ambulatory Visit: Payer: Self-pay | Admitting: Endocrinology

## 2021-06-13 ENCOUNTER — Other Ambulatory Visit: Payer: Self-pay | Admitting: Neurology

## 2021-06-13 DIAGNOSIS — E119 Type 2 diabetes mellitus without complications: Secondary | ICD-10-CM

## 2021-06-13 DIAGNOSIS — Z794 Long term (current) use of insulin: Secondary | ICD-10-CM

## 2021-06-14 NOTE — Telephone Encounter (Signed)
Patient has an appt for July 06 2021 with Dr.Jaffe, no refills given.

## 2021-06-17 ENCOUNTER — Ambulatory Visit (INDEPENDENT_AMBULATORY_CARE_PROVIDER_SITE_OTHER): Payer: Medicare Other | Admitting: *Deleted

## 2021-06-17 ENCOUNTER — Other Ambulatory Visit: Payer: Self-pay

## 2021-06-17 DIAGNOSIS — E538 Deficiency of other specified B group vitamins: Secondary | ICD-10-CM

## 2021-06-17 MED ORDER — CYANOCOBALAMIN 1000 MCG/ML IJ SOLN
1000.0000 ug | Freq: Once | INTRAMUSCULAR | Status: AC
  Administered 2021-06-17: 1000 ug via INTRAMUSCULAR

## 2021-06-17 NOTE — Progress Notes (Signed)
Patient is here for b12 injection . Given in left deltoid. Patient tolerated well  ? ? ?Please co sign ?

## 2021-06-24 ENCOUNTER — Other Ambulatory Visit: Payer: Self-pay

## 2021-06-24 ENCOUNTER — Ambulatory Visit (INDEPENDENT_AMBULATORY_CARE_PROVIDER_SITE_OTHER): Payer: Medicare Other | Admitting: Internal Medicine

## 2021-06-24 ENCOUNTER — Encounter: Payer: Self-pay | Admitting: Internal Medicine

## 2021-06-24 VITALS — BP 160/84 | HR 80 | Temp 98.2°F | Ht 62.0 in | Wt 205.0 lb

## 2021-06-24 DIAGNOSIS — E1165 Type 2 diabetes mellitus with hyperglycemia: Secondary | ICD-10-CM | POA: Diagnosis not present

## 2021-06-24 DIAGNOSIS — Z0001 Encounter for general adult medical examination with abnormal findings: Secondary | ICD-10-CM

## 2021-06-24 DIAGNOSIS — I10 Essential (primary) hypertension: Secondary | ICD-10-CM

## 2021-06-24 DIAGNOSIS — Z794 Long term (current) use of insulin: Secondary | ICD-10-CM

## 2021-06-24 DIAGNOSIS — E538 Deficiency of other specified B group vitamins: Secondary | ICD-10-CM

## 2021-06-24 DIAGNOSIS — E7800 Pure hypercholesterolemia, unspecified: Secondary | ICD-10-CM | POA: Diagnosis not present

## 2021-06-24 DIAGNOSIS — E559 Vitamin D deficiency, unspecified: Secondary | ICD-10-CM | POA: Diagnosis not present

## 2021-06-24 LAB — CBC WITH DIFFERENTIAL/PLATELET
Basophils Absolute: 0.1 10*3/uL (ref 0.0–0.1)
Basophils Relative: 1 % (ref 0.0–3.0)
Eosinophils Absolute: 0.4 10*3/uL (ref 0.0–0.7)
Eosinophils Relative: 5 % (ref 0.0–5.0)
HCT: 36.6 % (ref 36.0–46.0)
Hemoglobin: 12.2 g/dL (ref 12.0–15.0)
Lymphocytes Relative: 23 % (ref 12.0–46.0)
Lymphs Abs: 1.7 10*3/uL (ref 0.7–4.0)
MCHC: 33.4 g/dL (ref 30.0–36.0)
MCV: 81.5 fl (ref 78.0–100.0)
Monocytes Absolute: 0.5 10*3/uL (ref 0.1–1.0)
Monocytes Relative: 6.2 % (ref 3.0–12.0)
Neutro Abs: 4.9 10*3/uL (ref 1.4–7.7)
Neutrophils Relative %: 64.8 % (ref 43.0–77.0)
Platelets: 247 10*3/uL (ref 150.0–400.0)
RBC: 4.49 Mil/uL (ref 3.87–5.11)
RDW: 15.1 % (ref 11.5–15.5)
WBC: 7.5 10*3/uL (ref 4.0–10.5)

## 2021-06-24 LAB — HEPATIC FUNCTION PANEL
ALT: 19 U/L (ref 0–35)
AST: 16 U/L (ref 0–37)
Albumin: 4.3 g/dL (ref 3.5–5.2)
Alkaline Phosphatase: 77 U/L (ref 39–117)
Bilirubin, Direct: 0.1 mg/dL (ref 0.0–0.3)
Total Bilirubin: 0.6 mg/dL (ref 0.2–1.2)
Total Protein: 6.8 g/dL (ref 6.0–8.3)

## 2021-06-24 LAB — URINALYSIS, ROUTINE W REFLEX MICROSCOPIC
Bilirubin Urine: NEGATIVE
Hgb urine dipstick: NEGATIVE
Ketones, ur: NEGATIVE
Leukocytes,Ua: NEGATIVE
Nitrite: NEGATIVE
RBC / HPF: NONE SEEN (ref 0–?)
Specific Gravity, Urine: 1.025 (ref 1.000–1.030)
Total Protein, Urine: NEGATIVE
Urine Glucose: 500 — AB
Urobilinogen, UA: 0.2 (ref 0.0–1.0)
pH: 6 (ref 5.0–8.0)

## 2021-06-24 LAB — BASIC METABOLIC PANEL
BUN: 11 mg/dL (ref 6–23)
CO2: 26 mEq/L (ref 19–32)
Calcium: 9.3 mg/dL (ref 8.4–10.5)
Chloride: 102 mEq/L (ref 96–112)
Creatinine, Ser: 0.88 mg/dL (ref 0.40–1.20)
GFR: 79.53 mL/min (ref >60.00)
Glucose, Bld: 161 mg/dL — ABNORMAL HIGH (ref 70–99)
Potassium: 3.9 mEq/L (ref 3.5–5.1)
Sodium: 137 mEq/L (ref 135–145)

## 2021-06-24 LAB — VITAMIN D 25 HYDROXY (VIT D DEFICIENCY, FRACTURES): VITD: 67.8 ng/mL (ref 30.00–100.00)

## 2021-06-24 LAB — LIPID PANEL
Cholesterol: 117 mg/dL (ref 0–200)
HDL: 37.4 mg/dL — ABNORMAL LOW (ref >39.00)
NonHDL: 79.47
Total CHOL/HDL Ratio: 3
Triglycerides: 287 mg/dL — ABNORMAL HIGH (ref 0.0–149.0)
VLDL: 57.4 mg/dL — ABNORMAL HIGH (ref 0.0–40.0)

## 2021-06-24 LAB — HEMOGLOBIN A1C: Hgb A1c MFr Bld: 8.2 % — ABNORMAL HIGH (ref 4.6–6.5)

## 2021-06-24 LAB — TSH: TSH: 1.75 u[IU]/mL (ref 0.35–5.50)

## 2021-06-24 LAB — MICROALBUMIN / CREATININE URINE RATIO
Creatinine,U: 118.6 mg/dL
Microalb Creat Ratio: 1.7 mg/g (ref 0.0–30.0)
Microalb, Ur: 2 mg/dL — ABNORMAL HIGH (ref 0.0–1.9)

## 2021-06-24 LAB — LDL CHOLESTEROL, DIRECT: Direct LDL: 53 mg/dL

## 2021-06-24 LAB — VITAMIN B12: Vitamin B-12: 516 pg/mL (ref 211–911)

## 2021-06-24 MED ORDER — AMLODIPINE BESYLATE 10 MG PO TABS: 10.0000 mg | ORAL_TABLET | Freq: Every day | ORAL | 3 refills | Status: DC

## 2021-06-24 NOTE — Patient Instructions (Signed)
Please take all new medication as prescribed  - the amlodipine 10 mg per day ? ?Please continue all other medications as before, and refills have been done if requested. ? ?Please have the pharmacy call with any other refills you may need. ? ?Please continue your efforts at being more active, low cholesterol diet, and weight control. ? ?You are otherwise up to date with prevention measures today. ? ?Please keep your appointments with your specialists as you may have planned ? ?You will be contacted regarding the referral for: Diabetes Education ? ?Please go to the LAB at the blood drawing area for the tests to be done ? ?You will be contacted by phone if any changes need to be made immediately.  Otherwise, you will receive a letter about your results with an explanation, but please check with MyChart first. ? ?Please remember to sign up for MyChart if you have not done so, as this will be important to you in the future with finding out test results, communicating by private email, and scheduling acute appointments online when needed. ? ?Please make an Appointment to return in 6 months, or sooner if needed ?

## 2021-06-24 NOTE — Assessment & Plan Note (Signed)
Last vitamin D ?Lab Results  ?Component Value Date  ? VD25OH 71.52 12/24/2020  ? ?Stable, cont oral replacement ? ?

## 2021-06-24 NOTE — Assessment & Plan Note (Addendum)
Lab Results  ?Component Value Date  ? HGBA1C 8.4 (A) 05/07/2021  ? ?uncontrolled, pt to continue current medical treatment , f/u endo as planned, also refer DM education ? ?

## 2021-06-24 NOTE — Progress Notes (Signed)
Patient ID: [name], female   DOB: 1975-08-17, 46 y.o.   MRN: [id number]         Chief Complaint:: wellness exam and htn, dm, b12 deficincy, hld       HPI:  Courtney [name] is a 46 y.o. female here for wellness exam; due for hep c screen, plans to call for GYN soon and eye exam soon;  declines covid booster, o/w up to date                        Also no falls, seeing neuro later this month.  Pt denies chest pain, increased sob or doe, wheezing, orthopnea, PND, increased LE swelling, palpitations, dizziness or syncope.   Pt denies polydipsia, polyuria, or new focal neuro s/s.   Pt denies fever, wt loss, night sweats, loss of appetite, or other constitutional symptoms     Wt Readings from Last 3 Encounters:  06/24/21 205 lb (93 kg)  05/07/21 206 lb 12.8 oz (93.8 kg)  03/05/21 201 lb 6.4 oz (91.4 kg)   BP Readings from Last 3 Encounters:  06/24/21 (!) 160/84  05/07/21 (!) 192/100  03/05/21 (!) 160/90   Immunization History  Administered Date(s) Administered   Influenza Split 01/14/2011, 01/09/2012   Influenza Whole 03/13/2007, 02/07/2008, 02/27/2009, 01/13/2010   Influenza,inj,Quad PF,6+ Mos 01/09/2013, 02/07/2014, 02/13/2015, 02/17/2016, 01/13/2017, 01/03/2018, 01/17/2019, 02/14/2020, 12/24/2020   PFIZER Comirnaty(Gray Top)Covid-19 Tri-Sucrose Vaccine 04/28/2020   PFIZER(Purple Top)SARS-COV-2 Vaccination 07/12/2019, 08/05/2019   Pneumococcal Polysaccharide-23 02/13/2015   Td 04/18/2004   Tdap 04/22/2014   Health Maintenance Due  Topic Date Due   Hepatitis C Screening  Never done      Past Medical History:  Diagnosis Date   ALLERGIC RHINITIS 03/13/2007   Allergy    Anemia, B12 deficiency    Anxiety    C. difficile colitis    CARPAL TUNNEL SYNDROME, BILATERAL 01/13/2010   Cervical lesion 07/13/2011   Spine lesion - indeterminate, for f/u MRI may 2013 per neurology   Chronic fatigue    CTS (carpal tunnel syndrome)    Diabetes mellitus without complication ([place])     ESSENTIAL HYPERTENSION 03/13/2007   Fibromyalgia    GERD (gastroesophageal reflux disease)    Heart murmur    Hx of adenomatous polyp of colon 06/09/2009   Hyperlipidemia    HYPERLIPIDEMIA 03/13/2007   Irritable bowel syndrome 05/24/2010   Multiple sclerosis ([place])    OBSTRUCTIVE SLEEP APNEA 12/08/2008   Sleep apnea    uses c pap   VITAMIN D DEFICIENCY 01/13/2010   Past Surgical History:  Procedure Laterality Date   APPENDECTOMY  07/23/12   CARPAL TUNNEL RELEASE  01/2010   bilateral   COLONOSCOPY W/ BIOPSIES AND POLYPECTOMY  05/2009   2 tubular adenomas   LAPAROSCOPIC APPENDECTOMY N/A 07/23/2012   Procedure: APPENDECTOMY LAPAROSCOPIC;  Surgeon: Gayland Curry, MD;  Location: [facility];  Service: General;  Laterality: N/A;    reports that she has never smoked. She has never used smokeless tobacco. She reports that she does not drink alcohol and does not use drugs. family history includes Asthma in her daughter; COPD in her father; Diabetes in her father and another family member; Heart attack in her maternal grandfather; Hypertension in her father and another family member; Lung cancer in her maternal grandmother; Migraines in her mother; Prostate cancer in her maternal uncle; Stroke in her maternal grandfather. Allergies  Allergen Reactions   Ciprofloxacin In D5w Rash  Penicillins     REACTION: rash   Tricor [Fenofibrate] Rash   Current Outpatient Medications on File Prior to Visit  Medication Sig Dispense Refill   atorvastatin (LIPITOR) 40 MG tablet Take 1 tablet (40 mg total) by mouth daily. 90 tablet 3   AUBAGIO 14 MG TABS Take 1 tablet by mouth daily. 90 tablet 1   baclofen (LIORESAL) 10 MG tablet TAKE 1 TABLET BY MOUTH 3 TIMES  DAILY 90 tablet 0   cetirizine (ZYRTEC) 10 MG tablet Take 10 mg by mouth daily.       Cholecalciferol (VITAMIN D) 2000 UNITS CAPS Take 2 capsules by mouth every morning. Reported on 06/05/2015     Continuous Blood Gluc Sensor (FREESTYLE LIBRE 2 SENSOR) MISC 1  Device by Does not apply route every 14 (fourteen) days. 6 each 3   cyanocobalamin (,VITAMIN B-12,) 1000 MCG/ML injection Inject into the muscle every 30 (thirty) days.     famotidine (PEPCID) 20 MG tablet TAKE 1 TABLET BY MOUTH  TWICE DAILY 180 tablet 3   furosemide (LASIX) 40 MG tablet TAKE 1 TABLET BY MOUTH  DAILY 90 tablet 0   gabapentin (NEURONTIN) 300 MG capsule TAKE 2 CAPSULES BY MOUTH IN THE  MORNING 1 CAPSULE BY MOUTH IN  THE AFTERNOON AND 2 CAPSULES BY  MOUTH IN THE EVENING 150 capsule 2   glucose blood (FREESTYLE TEST STRIPS) test strip 1 each by Other route 2 (two) times daily. And lancets, 2 times daily. 180 each 3   glucose monitoring kit (FREESTYLE) monitoring kit 1 each by Does not apply route as needed for other. Dispense any model that is covered- dispense testing supplies for Q AC/ HS accuchecks- 1 month supply with one refil. 1 each 1   insulin glargine (LANTUS SOLOSTAR) 100 UNIT/ML Solostar Pen Inject 20 Units into the skin at bedtime. 15 mL PRN   insulin lispro (HUMALOG KWIKPEN) 100 UNIT/ML KwikPen 3 times a day (just before each meal), 25-18-14 and pen needles 4/day. 60 mL 3   Insulin Pen Needle (B-D UF III MINI PEN NEEDLES) 31G X 5 MM MISC USE TO INJECT HUMALOG  KWIKPEN INTO THE SKIN 3  TIMES DAILY 270 each 2   loperamide (IMODIUM) 2 MG capsule Take 2 mg by mouth as needed for diarrhea or loose stools.     losartan (COZAAR) 100 MG tablet TAKE 1 TABLET BY MOUTH  DAILY 90 tablet 3   medroxyPROGESTERone (DEPO-PROVERA) 150 MG/ML injection Inject 150 mg into the muscle every 3 (three) months.       meloxicam (MOBIC) 15 MG tablet Take 1 tablet (15 mg total) by mouth daily. 90 tablet 3   metFORMIN (GLUCOPHAGE-XR) 500 MG 24 hr tablet TAKE 2 TABLETS BY MOUTH  DAILY 180 tablet 3   metoprolol tartrate (LOPRESSOR) 100 MG tablet TAKE 2 TABLETS BY MOUTH  DAILY 180 tablet 3   nortriptyline (PAMELOR) 50 MG capsule TAKE 2 CAPSULES BY MOUTH AT BEDTIME 180 capsule 3   omeprazole-sodium  bicarbonate (ZEGERID) 40-1100 MG per capsule Take 1 capsule by mouth daily.       potassium chloride (KLOR-CON) 10 MEQ tablet TAKE 2 TABLETS BY MOUTH  DAILY 180 tablet 3   RYBELSUS 14 MG TABS TAKE 1 TABLET BY MOUTH  DAILY 90 tablet 3   traMADol (ULTRAM) 50 MG tablet Take 1 tablet (50 mg total) by mouth every 6 (six) hours as needed. 20 tablet 1   Current Facility-Administered Medications on File Prior to Visit  Medication  Dose Route Frequency Provider Last Rate Last Admin   medroxyPROGESTERone Acetate SUSY 150 mg  150 mg Intramuscular Q90 days Biagio Borg, MD   150 mg at 10/07/20 1444        ROS:  All others reviewed and negative.  Objective        PE:  BP (!) 160/84 (BP Location: Right Arm, Patient Position: Sitting, Cuff Size: Large)    Pulse 80    Temp 98.2 F (36.8 C) (Oral)    Ht '5\' 2"'  (1.575 m)    Wt 205 lb (93 kg)    SpO2 98%    BMI 37.49 kg/m                 Constitutional: Pt appears in NAD               HENT: Head: NCAT.                Right Ear: External ear normal.                 Left Ear: External ear normal.                Eyes: . Pupils are equal, round, and reactive to light. Conjunctivae and EOM are normal               Nose: without d/c or deformity               Neck: Neck supple. Gross normal ROM               Cardiovascular: Normal rate and regular rhythm.                 Pulmonary/Chest: Effort normal and breath sounds without rales or wheezing.                Abd:  Soft, NT, ND, + BS, no organomegaly               Neurological: Pt is alert. At baseline orientation, motor grossly intact               Skin: Skin is warm. No rashes, no other new lesions, LE edema - none               Psychiatric: Pt behavior is normal without agitation   Micro: none  Cardiac tracings I have personally interpreted today:  none  Pertinent Radiological findings (summarize): none   Lab Results  Component Value Date   WBC 7.5 06/24/2021   HGB 12.2 06/24/2021   HCT 36.6  06/24/2021   PLT 247.0 06/24/2021   GLUCOSE 161 (H) 06/24/2021   CHOL 117 06/24/2021   TRIG 287.0 (H) 06/24/2021   HDL 37.40 (L) 06/24/2021   LDLDIRECT 53.0 06/24/2021   ALT 19 06/24/2021   AST 16 06/24/2021   NA 137 06/24/2021   K 3.9 06/24/2021   CL 102 06/24/2021   CREATININE 0.88 06/24/2021   BUN 11 06/24/2021   CO2 26 06/24/2021   TSH 1.75 06/24/2021   INR 1.0 12/23/2010   HGBA1C 8.2 (H) 06/24/2021   MICROALBUR 2.0 (H) 06/24/2021   Assessment/Plan:  Courtney [name] is a 46 y.o. White or Caucasian [1] female with  has a past medical history of ALLERGIC RHINITIS (03/13/2007), Allergy, Anemia, B12 deficiency, Anxiety, C. difficile colitis, CARPAL TUNNEL SYNDROME, BILATERAL (01/13/2010), Cervical lesion (07/13/2011), Chronic fatigue, CTS (carpal tunnel syndrome), Diabetes mellitus without complication ([place]), ESSENTIAL HYPERTENSION (03/13/2007), Fibromyalgia, GERD (  gastroesophageal reflux disease), Heart murmur, adenomatous polyp of colon (06/09/2009), Hyperlipidemia, HYPERLIPIDEMIA (03/13/2007), Irritable bowel syndrome (05/24/2010), Multiple sclerosis ([place]), OBSTRUCTIVE SLEEP APNEA (12/08/2008), Sleep apnea, and VITAMIN D DEFICIENCY (01/13/2010).  Vitamin D deficiency Last vitamin D Lab Results  Component Value Date   VD25OH 71.52 12/24/2020   Stable, cont oral replacement   Vitamin B 12 deficiency Lab Results  Component Value Date   VITAMINB12 423 12/24/2020   Stable, cont oral replacement - b12 1000 mcg qd   Diabetes ([place]) Lab Results  Component Value Date   HGBA1C 8.4 (A) 05/07/2021   uncontrolled, pt to continue current medical treatment , f/u endo as planned, also refer DM education   Encounter for well adult exam with abnormal findings Age and sex appropriate education and counseling updated with regular exercise and diet Referrals for preventative services - for hep c screen, plans to call herself for gyn and eye exam Immunizations addressed - declines covid  booster Smoking counseling  - none needed Evidence for depression or other mood disorder - none significant Most recent labs reviewed. I have personally reviewed and have noted: 1) the patient's medical and social history 2) The patient's current medications and supplements 3) The patient's height, weight, and BMI have been recorded in the chart   Hyperlipidemia  Direct LDL mg/dL 53.0  86.0 CM  70.0 CM  53.0 CM  121.0     Stable, pt to continue current statin lipitor 40   Essential hypertension BP Readings from Last 3 Encounters:  06/24/21 (!) 160/84  05/07/21 (!) 192/100  03/05/21 (!) 160/90   uncontrolled, pt to add amlodipine 10 qd  Followup: Return in about 6 months (around 12/25/2021).  Cathlean Cower, MD 06/26/2021 7:34 PM [facility]

## 2021-06-24 NOTE — Assessment & Plan Note (Signed)
Lab Results  ?Component Value Date  ? LFYBOFBP10 423 12/24/2020  ? ?Stable, cont oral replacement - b12 1000 mcg qd ? ?

## 2021-06-26 ENCOUNTER — Encounter: Payer: Self-pay | Admitting: Internal Medicine

## 2021-06-26 NOTE — Assessment & Plan Note (Signed)
  Direct LDL mg/dL 53.0  86.0 CM  70.0 CM  53.0 CM  121.0     Stable, pt to continue current statin lipitor 40

## 2021-06-26 NOTE — Assessment & Plan Note (Signed)
Age and sex appropriate education and counseling updated with regular exercise and diet ?Referrals for preventative services - for hep c screen, plans to call herself for gyn and eye exam ?Immunizations addressed - declines covid booster ?Smoking counseling  - none needed ?Evidence for depression or other mood disorder - none significant ?Most recent labs reviewed. ?I have personally reviewed and have noted: ?1) the patient's medical and social history ?2) The patient's current medications and supplements ?3) The patient's height, weight, and BMI have been recorded in the chart ? ?

## 2021-06-26 NOTE — Assessment & Plan Note (Signed)
BP Readings from Last 3 Encounters:  ?06/24/21 (!) 160/84  ?05/07/21 (!) 192/100  ?03/05/21 (!) 160/90  ? ?uncontrolled, pt to add amlodipine 10 qd ? ?

## 2021-07-05 ENCOUNTER — Ambulatory Visit (INDEPENDENT_AMBULATORY_CARE_PROVIDER_SITE_OTHER): Payer: Medicare Other | Admitting: Endocrinology

## 2021-07-05 ENCOUNTER — Other Ambulatory Visit: Payer: Self-pay

## 2021-07-05 ENCOUNTER — Encounter: Payer: Self-pay | Admitting: Endocrinology

## 2021-07-05 VITALS — BP 150/98 | HR 76 | Ht 62.0 in | Wt 202.6 lb

## 2021-07-05 DIAGNOSIS — E1165 Type 2 diabetes mellitus with hyperglycemia: Secondary | ICD-10-CM | POA: Diagnosis not present

## 2021-07-05 DIAGNOSIS — Z794 Long term (current) use of insulin: Secondary | ICD-10-CM | POA: Diagnosis not present

## 2021-07-05 MED ORDER — LANTUS SOLOSTAR 100 UNIT/ML ~~LOC~~ SOPN: 28.0000 [IU] | PEN_INJECTOR | Freq: Every day | SUBCUTANEOUS | 99 refills | Status: DC

## 2021-07-05 MED ORDER — INSULIN LISPRO (1 UNIT DIAL) 100 UNIT/ML (KWIKPEN): PEN_INJECTOR | SUBCUTANEOUS | 3 refills | Status: DC

## 2021-07-05 NOTE — Progress Notes (Signed)
? ?Subjective:  ? ? Patient ID: Courtney [name], female    DOB: [birth year], 46 y.o.   MRN: [id number] ? ?HPI ?Pt returns for f/u of diabetes mellitus: ?DM type: Insulin-requiring type 2 ?Dx'ed: 2016, when she presented with nonketotic hyperosmolar hyperglycemic state, but not DKA.   ?Complications: none ?Therapy: insulin since 2018, and 2 oral meds.  ?GDM: never. ?DKA: never. ?Severe hypoglycemia: never.  ?Pancreatitis: never.   ?Other: she takes multiple daily injections; metformin dosage is limited by nausea.  She declines weight loss surgery; she did not tolerate Invokana (vaginitis); She declines pump, for now.  She uses [place] CGM; she eats meals at 9AM, 1PM, and 6PM.   ?Interval history: I reviewed continuous glucose monitor data.  Glucose varies from 140-260.  It is in general highest at [place], and lowest at [place].  It increases overnight, and then decreases until 3PM No recent steroids.  She denies hypoglycemia.  She again says bp is normal at home.  ?Past Medical History:  ?Diagnosis Date  ? ALLERGIC RHINITIS 03/13/2007  ? Allergy   ? Anemia, B12 deficiency   ? Anxiety   ? C. difficile colitis   ? CARPAL TUNNEL SYNDROME, BILATERAL 01/13/2010  ? Cervical lesion 07/13/2011  ? Spine lesion - indeterminate, for f/u MRI may 2013 per neurology  ? Chronic fatigue   ? CTS (carpal tunnel syndrome)   ? Diabetes mellitus without complication ([place])   ? ESSENTIAL HYPERTENSION 03/13/2007  ? Fibromyalgia   ? GERD (gastroesophageal reflux disease)   ? Heart murmur   ? Hx of adenomatous polyp of colon 06/09/2009  ? Hyperlipidemia   ? HYPERLIPIDEMIA 03/13/2007  ? Irritable bowel syndrome 05/24/2010  ? Multiple sclerosis ([place])   ? OBSTRUCTIVE SLEEP APNEA 12/08/2008  ? Sleep apnea   ? uses c pap  ? VITAMIN D DEFICIENCY 01/13/2010  ? ? ?Past Surgical History:  ?Procedure Laterality Date  ? APPENDECTOMY  07/23/12  ? CARPAL TUNNEL RELEASE  01/2010  ? bilateral  ? COLONOSCOPY W/ BIOPSIES AND POLYPECTOMY  05/2009  ? 2 tubular adenomas  ?  LAPAROSCOPIC APPENDECTOMY N/A 07/23/2012  ? Procedure: APPENDECTOMY LAPAROSCOPIC;  Surgeon: Gayland Curry, MD;  Location: [facility];  Service: General;  Laterality: N/A;  ? ? ?Social History  ? ?Socioeconomic History  ? Marital status: Single  ?  Spouse name: Not on file  ? Number of children: 2  ? Years of education: Not on file  ? Highest education level: Not on file  ?Occupational History  ? 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  ?  Employer: UNEMPLOYED  ?Tobacco Use  ? Smoking status: Never  ? Smokeless tobacco: Never  ?Vaping Use  ? Vaping Use: Never used  ?Substance and Sexual Activity  ? Alcohol use: No  ?  Alcohol/week: 0.0 standard drinks  ? Drug use: No  ? Sexual activity: Not on file  ?Other Topics Concern  ? Not on file  ?Social History Narrative  ? Daily Caffeine  ? Right handed  ? Two story home  ? ?Social Determinants of Health  ? ?Financial Resource Strain: Not on file  ?Food Insecurity: Not on file  ?Transportation Needs: Not on file  ?Physical Activity: Not on file  ?Stress: Not on file  ?Social Connections: Not on file  ?Intimate Partner Violence: Not on file  ? ? ?Current Outpatient Medications on File Prior to Visit  ?Medication Sig Dispense Refill  ? amLODipine (NORVASC) 10 MG tablet Take 1 tablet (10 mg total) by  mouth daily. 90 tablet 3  ? atorvastatin (LIPITOR) 40 MG tablet Take 1 tablet (40 mg total) by mouth daily. 90 tablet 3  ? AUBAGIO 14 MG TABS Take 1 tablet by mouth daily. 90 tablet 1  ? baclofen (LIORESAL) 10 MG tablet TAKE 1 TABLET BY MOUTH 3 TIMES  DAILY 90 tablet 0  ? cetirizine (ZYRTEC) 10 MG tablet Take 10 mg by mouth daily.      ? Cholecalciferol (VITAMIN D) 2000 UNITS CAPS Take 2 capsules by mouth every morning. Reported on 06/05/2015    ? Continuous Blood Gluc Sensor (FREESTYLE LIBRE 2 SENSOR) MISC 1 Device by Does not apply route every 14 (fourteen) days. 6 each 3  ? cyanocobalamin (,VITAMIN B-12,) 1000 MCG/ML injection Inject into the muscle every 30 (thirty) days.    ? famotidine  (PEPCID) 20 MG tablet TAKE 1 TABLET BY MOUTH  TWICE DAILY 180 tablet 3  ? furosemide (LASIX) 40 MG tablet TAKE 1 TABLET BY MOUTH  DAILY 90 tablet 0  ? gabapentin (NEURONTIN) 300 MG capsule TAKE 2 CAPSULES BY MOUTH IN THE  MORNING 1 CAPSULE BY MOUTH IN  THE AFTERNOON AND 2 CAPSULES BY  MOUTH IN THE EVENING 150 capsule 2  ? glucose blood (FREESTYLE TEST STRIPS) test strip 1 each by Other route 2 (two) times daily. And lancets, 2 times daily. 180 each 3  ? glucose monitoring kit (FREESTYLE) monitoring kit 1 each by Does not apply route as needed for other. Dispense any model that is covered- dispense testing supplies for Q AC/ HS accuchecks- 1 month supply with one refil. 1 each 1  ? Insulin Pen Needle (B-D UF III MINI PEN NEEDLES) 31G X 5 MM MISC USE TO INJECT HUMALOG  KWIKPEN INTO THE SKIN 3  TIMES DAILY 270 each 2  ? loperamide (IMODIUM) 2 MG capsule Take 2 mg by mouth as needed for diarrhea or loose stools.    ? losartan (COZAAR) 100 MG tablet TAKE 1 TABLET BY MOUTH  DAILY 90 tablet 3  ? medroxyPROGESTERone (DEPO-PROVERA) 150 MG/ML injection Inject 150 mg into the muscle every 3 (three) months.      ? meloxicam (MOBIC) 15 MG tablet Take 1 tablet (15 mg total) by mouth daily. 90 tablet 3  ? metFORMIN (GLUCOPHAGE-XR) 500 MG 24 hr tablet TAKE 2 TABLETS BY MOUTH  DAILY 180 tablet 3  ? metoprolol tartrate (LOPRESSOR) 100 MG tablet TAKE 2 TABLETS BY MOUTH  DAILY 180 tablet 3  ? nortriptyline (PAMELOR) 50 MG capsule TAKE 2 CAPSULES BY MOUTH AT BEDTIME 180 capsule 3  ? omeprazole-sodium bicarbonate (ZEGERID) 40-1100 MG per capsule Take 1 capsule by mouth daily.      ? potassium chloride (KLOR-CON) 10 MEQ tablet TAKE 2 TABLETS BY MOUTH  DAILY 180 tablet 3  ? RYBELSUS 14 MG TABS TAKE 1 TABLET BY MOUTH  DAILY 90 tablet 3  ? traMADol (ULTRAM) 50 MG tablet Take 1 tablet (50 mg total) by mouth every 6 (six) hours as needed. 20 tablet 1  ? ?No current facility-administered medications on file prior to visit.  ? ? ?Allergies   ?Allergen Reactions  ? Ciprofloxacin In D5w Rash  ? Penicillins   ?  REACTION: rash  ? Tricor [Fenofibrate] Rash  ? ? ?Family History  ?Problem Relation Age of Onset  ? Migraines Mother   ? Hypertension Father   ? COPD Father   ? Diabetes Father   ? Prostate cancer Maternal Uncle   ?  prostate cancer  ? Lung cancer Maternal Grandmother   ? Stroke Maternal Grandfather   ? Heart attack Maternal Grandfather   ? Diabetes Other   ? Hypertension Other   ? Asthma Daughter   ? Colon cancer Neg Hx   ? Esophageal cancer Neg Hx   ? Rectal cancer Neg Hx   ? Stomach cancer Neg Hx   ? ? ?BP (!) 150/98 (BP Location: Right Arm, Patient Position: Sitting, Cuff Size: Normal)   Pulse 76   Ht '5\' 2"'  (1.575 m)   Wt 202 lb 9.6 oz (91.9 kg)   SpO2 99%   BMI 37.06 kg/m?  ? ? ?Review of Systems ? ?   ?Objective:  ? Physical Exam ? ? ? ?Lab Results  ?Component Value Date  ? HGBA1C 8.2 (H) 06/24/2021  ? ?   ?Assessment & Plan:  ?Insulin-requiring type 2 DM: uncontrolled ? ?Patient Instructions  ?I have sent a prescription to your pharmacy, to change both insulins, as below.   ?Please continue the same other medications.   ?check your blood sugar twice a day.  vary the time of day when you check, between before the 3 meals, and at bedtime.  also check if you have symptoms of your blood sugar being too high or too low.  please keep a record of the readings and bring it to your next appointment here (or you can bring the meter itself).  You can write it on any piece of paper.  please call us sooner if your blood sugar goes below 70, or if you have a lot of readings over 200.   ?Please come back for a follow-up appointment in 2 months.   ? ? ? ?

## 2021-07-05 NOTE — Progress Notes (Signed)
? ?NEUROLOGY FOLLOW UP OFFICE NOTE ? ?[place] ?[id number] ? ?Assessment/Plan:  ? ?Multiple sclerosis ?Hypertension - elevated today.  Just started new medication.   ?  ?DMT:  Aubagio ?Pain management:  Gabapentin '600mg'$ /'300mg'$ /'600mg'$ , baclofen, nortriptyline '100mg'$  at bedtime ?D3 10,000 IU daily ?Check CBC with diff, CMP, vit D in 6 months ?Follow up 6 months.   ?Follow up with PCP regarding blood pressure. ?  ?Subjective:  ?Courtney [name] is a 46 year old right-handed Caucasian woman who follows up for multiple sclerosis. ?  ?UPDATE: ?Current DMT:  Aubagio ?Other current medications: baclofen 10 mg, gabapentin '600mg'$ /'300mg'$ /'600mg'$ , D3 10000 IU daily, B12 1032mg injection every 30 days, nortriptyline 100 mg ? ?Due to some slight increased right sided weakness in September, underwent MRI brain and cervical spine with and without contrast on 01/22/2021 personally reviewed which appeared stable compared to prior imaging from 10/30/2019.  This has not progressed.  It is about the same.   ?  ?06/24/2021 LABS:  CBC with diff normal.  Hepatic panel normal.  Vit D level 67.80.  B12 516. ?  ?  ?Vision:No issues ?Motor: notes residual right upper and lower extremity weakness.  Notes grip in both hands aren't as strong. ?Sensory: may get facial numbness with heat and stress.  ?Pain: controlled.   ?Gait: balance okay but feels that she is walking a little slower. ?Bowel/Bladder:  No change ?Fatigue: No ?Cognition: Sometimes she has trouble remembering new information ?Mood: Stable ?  ?  ?HISTORY: ?Onset of symptoms and diagnosis occurred in 2006.  She experienced an episode of numbness in her right arm and both lower extremities.  She was dropping objects.  MRI of brain reportedly showed ?solitary right mid-to-posterior frontal periventricular white matter lesion?.[facility] Kitchen MRI of spine revealed no cord demyelination.  CSF analysis from lumbar puncture reportedly revealed oligoclonal bands and an elevated IgG index.  She was diagnosed  with clinically isolated syndrome but was officially diagnosed with multiple sclerosis a few months later when she developed a relapse with left hand numbness.  In February 2013, she developed left arm pain and swelling.  MRI of cervical spine revealed an enhancing spinal cord lesion at C4 on the right.  She was treated with IV Solumedrol.  In 2014, she had an episode of optic neuritis in her right eye, treated with IV Solumedrol.  In September-October 2019, she reported to 4 weeks history of tingling in her face, which may occur off and on for 10 minutes 3 times a week.  She was prescribed a prednisone taper.  Compared to prior MRI from 02/07/2017, repeat MRI of the brain and cervical spine with and without contrast from 02/19/2018, which were personally reviewed, demonstrated mildly progressed bifrontal cerebral white matter changes as well as probable additional abnormal signal in the left cord at the C6-7 level but no abnormal enhancement to suggest active demyelinating disease. Questionable if paresthesias were secondary to B12 deficiency as well. ?  ?Other history:  History of migraines and ocular migraines ?  ?There is no family history of MS. ?  ?Past disease modifying therapy:  Avonex (difficulty injecting), Rebif (painful injections), Tecfidera (GI side effects) ?  ?Imaging: ?06/03/2011:  MRI brain with and without contrast:  single right frontal periventricular white matter lesion with high T2 FLAIR signal ?06/03/2011:  MRI cervical spine with and without contrast:  abnormal rim enhancing lesion in the spinal cord at the level of C4 on the right which mildly expands the cord. ?01/21/2012:  MRI  brain with and without contrast:  unchanged nonspecific right periventricular white matter lesion ?01/21/2012: MRI cervical spine with and without contrast: stable nonenhancing cord lesion at C4 ?11/14/2012: MRI brain with and without contrast.  No change ?11/14/2012:  MRI cervical spine with and without contrast:   No change ?12/02/2013: MRI brain with and without contrast:  1. No substantial change in the right frontal juxtacortical, right corona radiata, and left periatrial white matter T2/FLAIR hyperintensities. While nonspecific, these likely reflect demyelinating lesions in this patient with a history of multiple sclerosis and concomitant cervical cord lesion. No new lesions are identified and none of the existing lesions enhance or restrict diffusion.  2. Subcentimeter left parafalcine dural based lesion as described above has slowly enlarged over time and demonstrates equivocal enhancement, but may represent a meningioma.  Recommend attention on follow up imaging. ?12/02/2013:  MRI cervical spine with and without contrast:  No change ?12/15/2014:  MRI brain with and without contrast:  1.  Similar appearance of focal T2/FLAIR hyperintense white matter lesion within the right lateral body of the corpus callosum without associated enhancement to suggest active demyelination.  2.  No new or enlarging white matter lesions identified.  3.  Slowly enlarging nonenhancing nodular lesion along the left anterior aspect of the falx cerebri, which may reflect a small meningioma. ?12/15/2014:  MRI cervical spine with and without contrast:  no change ?10/10/2015:  MRI brain with and without contrast:  no change ?10/10/2015:  MRI cervical spine with and without contrast:  no change ?02/07/2017:  MRI brain with and without contrast: No significant change since 12/24/2008 ?02/07/2017: MRI cervical spine with and without contrast: Chronic lesion at C4 level, stable since 2013 ?02/19/2018:  MRI brain with and without contrast: Compared to prior imaging from 02/07/17, mildly progressed bifrontal cerebral white matter changes, right greater than left, but no abnormal enhancement to suggest active disease. ?02/19/2018:  MRI cervical spine with and without contrast: Again demonstrated chronic lesion at C4 level as well as probable subtle patchy  cord signal abnormality within the left cord at level of C6-7 not definitely seen on prior imaging from 02/07/17 but no abnormal enhancement to suggest active disease. ?10/30/2019 MRI brain and cervical spine with and without contrast:  no significant change compared to prior imaging from 2019 ? ?PAST MEDICAL HISTORY: ?Past Medical History:  ?Diagnosis Date  ? ALLERGIC RHINITIS 03/13/2007  ? Allergy   ? Anemia, B12 deficiency   ? Anxiety   ? C. difficile colitis   ? CARPAL TUNNEL SYNDROME, BILATERAL 01/13/2010  ? Cervical lesion 07/13/2011  ? Spine lesion - indeterminate, for f/u MRI may 2013 per neurology  ? Chronic fatigue   ? CTS (carpal tunnel syndrome)   ? Diabetes mellitus without complication ([place])   ? ESSENTIAL HYPERTENSION 03/13/2007  ? Fibromyalgia   ? GERD (gastroesophageal reflux disease)   ? Heart murmur   ? Hx of adenomatous polyp of colon 06/09/2009  ? Hyperlipidemia   ? HYPERLIPIDEMIA 03/13/2007  ? Irritable bowel syndrome 05/24/2010  ? Multiple sclerosis ([place])   ? OBSTRUCTIVE SLEEP APNEA 12/08/2008  ? Sleep apnea   ? uses c pap  ? VITAMIN D DEFICIENCY 01/13/2010  ? ? ?MEDICATIONS: ?Current Outpatient Medications on File Prior to Visit  ?Medication Sig Dispense Refill  ? amLODipine (NORVASC) 10 MG tablet Take 1 tablet (10 mg total) by mouth daily. 90 tablet 3  ? atorvastatin (LIPITOR) 40 MG tablet Take 1 tablet (40 mg total) by mouth daily. 90 tablet  3  ? AUBAGIO 14 MG TABS Take 1 tablet by mouth daily. 90 tablet 1  ? baclofen (LIORESAL) 10 MG tablet TAKE 1 TABLET BY MOUTH 3 TIMES  DAILY 90 tablet 0  ? cetirizine (ZYRTEC) 10 MG tablet Take 10 mg by mouth daily.      ? Cholecalciferol (VITAMIN D) 2000 UNITS CAPS Take 2 capsules by mouth every morning. Reported on 06/05/2015    ? Continuous Blood Gluc Sensor (FREESTYLE LIBRE 2 SENSOR) MISC 1 Device by Does not apply route every 14 (fourteen) days. 6 each 3  ? cyanocobalamin (,VITAMIN B-12,) 1000 MCG/ML injection Inject into the muscle every 30 (thirty) days.     ? famotidine (PEPCID) 20 MG tablet TAKE 1 TABLET BY MOUTH  TWICE DAILY 180 tablet 3  ? furosemide (LASIX) 40 MG tablet TAKE 1 TABLET BY MOUTH  DAILY 90 tablet 0  ? gabapentin (NEURONTIN) 300 MG capsule

## 2021-07-05 NOTE — Patient Instructions (Addendum)
I have sent a prescription to your pharmacy, to change both insulins, as below.   ?Please continue the same other medications.   ?check your blood sugar twice a day.  vary the time of day when you check, between before the 3 meals, and at bedtime.  also check if you have symptoms of your blood sugar being too high or too low.  please keep a record of the readings and bring it to your next appointment here (or you can bring the meter itself).  You can write it on any piece of paper.  please call us sooner if your blood sugar goes below 70, or if you have a lot of readings over 200.   ?Please come back for a follow-up appointment in 2 months.   ? ?

## 2021-07-06 ENCOUNTER — Encounter: Payer: Self-pay | Admitting: Neurology

## 2021-07-06 ENCOUNTER — Ambulatory Visit (INDEPENDENT_AMBULATORY_CARE_PROVIDER_SITE_OTHER): Payer: Medicare Other | Admitting: Neurology

## 2021-07-06 VITALS — BP 168/104 | HR 85 | Ht 62.0 in | Wt 204.0 lb

## 2021-07-06 DIAGNOSIS — G35 Multiple sclerosis: Secondary | ICD-10-CM

## 2021-07-06 DIAGNOSIS — R030 Elevated blood-pressure reading, without diagnosis of hypertension: Secondary | ICD-10-CM

## 2021-07-06 NOTE — Patient Instructions (Addendum)
Aubagio ?For pain:  gabapentin, baclofen and nortriptyline ?D3 10,000 IU daily ?Check CBC with diff, CMP and vit D in 6 months ?Follow up in 6 months (after repeat labs) ?Follow up with Dr. Jenny Reichmann regarding blood pressure ?

## 2021-07-12 ENCOUNTER — Ambulatory Visit (INDEPENDENT_AMBULATORY_CARE_PROVIDER_SITE_OTHER): Payer: Medicare Other

## 2021-07-12 ENCOUNTER — Other Ambulatory Visit: Payer: Self-pay

## 2021-07-12 DIAGNOSIS — Z309 Encounter for contraceptive management, unspecified: Secondary | ICD-10-CM | POA: Diagnosis not present

## 2021-07-12 MED ORDER — MEDROXYPROGESTERONE ACETATE 150 MG/ML IM SUSP
150.0000 mg | Freq: Once | INTRAMUSCULAR | Status: AC
  Administered 2021-07-12: 150 mg via INTRAMUSCULAR

## 2021-07-12 NOTE — Progress Notes (Signed)
Pt was given MedroxyPROGETERone w/o any complications.  ?

## 2021-07-22 DIAGNOSIS — Z794 Long term (current) use of insulin: Secondary | ICD-10-CM | POA: Diagnosis not present

## 2021-07-22 DIAGNOSIS — E119 Type 2 diabetes mellitus without complications: Secondary | ICD-10-CM | POA: Diagnosis not present

## 2021-07-27 ENCOUNTER — Telehealth: Payer: Self-pay

## 2021-07-27 NOTE — Telephone Encounter (Signed)
Letter received from [facility] pt needs to contact them in regards to her Aubagio.  ?

## 2021-07-30 ENCOUNTER — Other Ambulatory Visit: Payer: Self-pay | Admitting: Neurology

## 2021-07-30 DIAGNOSIS — G35 Multiple sclerosis: Secondary | ICD-10-CM

## 2021-08-14 ENCOUNTER — Other Ambulatory Visit: Payer: Self-pay | Admitting: Internal Medicine

## 2021-08-14 ENCOUNTER — Other Ambulatory Visit: Payer: Self-pay | Admitting: Neurology

## 2021-08-14 NOTE — Telephone Encounter (Signed)
Please refill as per office routine med refill policy (all routine meds to be refilled for 3 mo or monthly (per pt preference) up to one year from last visit, then month to month grace period for 3 mo, then further med refills will have to be denied) ? ?

## 2021-08-21 ENCOUNTER — Other Ambulatory Visit: Payer: Self-pay | Admitting: Internal Medicine

## 2021-08-21 NOTE — Telephone Encounter (Signed)
Please refill as per office routine med refill policy (all routine meds to be refilled for 3 mo or monthly (per pt preference) up to one year from last visit, then month to month grace period for 3 mo, then further med refills will have to be denied) ? ?

## 2021-08-22 DIAGNOSIS — E119 Type 2 diabetes mellitus without complications: Secondary | ICD-10-CM | POA: Diagnosis not present

## 2021-08-22 DIAGNOSIS — Z794 Long term (current) use of insulin: Secondary | ICD-10-CM | POA: Diagnosis not present

## 2021-08-23 ENCOUNTER — Ambulatory Visit (INDEPENDENT_AMBULATORY_CARE_PROVIDER_SITE_OTHER): Payer: Medicare Other | Admitting: Endocrinology

## 2021-08-23 ENCOUNTER — Encounter: Payer: Self-pay | Admitting: Endocrinology

## 2021-08-23 VITALS — BP 128/80 | Ht 62.0 in | Wt 200.0 lb

## 2021-08-23 DIAGNOSIS — Z794 Long term (current) use of insulin: Secondary | ICD-10-CM

## 2021-08-23 DIAGNOSIS — E1165 Type 2 diabetes mellitus with hyperglycemia: Secondary | ICD-10-CM

## 2021-08-23 LAB — POCT GLYCOSYLATED HEMOGLOBIN (HGB A1C): Hemoglobin A1C: 7.2 % — AB (ref 4.0–5.6)

## 2021-08-23 NOTE — Patient Instructions (Addendum)
Please continue the same insulin and other medications.  However, try taking the breakfast Humalog at 7AM, to block the blood sugar going up.   ?check your blood sugar twice a day.  vary the time of day when you check, between before the 3 meals, and at bedtime.  also check if you have symptoms of your blood sugar being too high or too low.  please keep a record of the readings and bring it to your next appointment here (or you can bring the meter itself).  You can write it on any piece of paper.  please call us sooner if your blood sugar goes below 70, or if you have a lot of readings over 200.   ?You should have an endocrinology follow-up appointment in 3 months.   ? ?

## 2021-08-23 NOTE — Progress Notes (Signed)
? ?Subjective:  ? ? Patient ID: Courtney [name], female    DOB: 06/11/1975, 46 y.o.   MRN: [id number] ? ?HPI ?Pt returns for f/u of diabetes mellitus: ?DM type: Insulin-requiring type 2 ?Dx'ed: 2016, when she presented with nonketotic hyperosmolar hyperglycemic state, but not DKA.   ?Complications: none ?Therapy: insulin since 2018, and 2 oral meds.  ?GDM: never. ?DKA: never. ?Severe hypoglycemia: never.  ?Pancreatitis: never.   ?Other: she takes multiple daily injections; metformin dosage is limited by nausea.  She declines weight loss surgery; she did not tolerate Invokana (vaginitis); She declines pump, for now.  She uses [place] CGM; she eats meals at 9AM, 1PM, and 6PM.   ?Interval history: I reviewed continuous glucose monitor data.  Glucose varies from 110-230.  It is in general highest at [place], and lowest at 12MN and 10AM.  It increases slightly overnight. No recent steroids.  She denies hypoglycemia.  She again says bp is normal at home.   ?Past Medical History:  ?Diagnosis Date  ? ALLERGIC RHINITIS 03/13/2007  ? Allergy   ? Anemia, B12 deficiency   ? Anxiety   ? C. difficile colitis   ? CARPAL TUNNEL SYNDROME, BILATERAL 01/13/2010  ? Cervical lesion 07/13/2011  ? Spine lesion - indeterminate, for f/u MRI may 2013 per neurology  ? Chronic fatigue   ? CTS (carpal tunnel syndrome)   ? Diabetes mellitus without complication ([place])   ? ESSENTIAL HYPERTENSION 03/13/2007  ? Fibromyalgia   ? GERD (gastroesophageal reflux disease)   ? Heart murmur   ? Hx of adenomatous polyp of colon 06/09/2009  ? Hyperlipidemia   ? HYPERLIPIDEMIA 03/13/2007  ? Irritable bowel syndrome 05/24/2010  ? Multiple sclerosis ([place])   ? OBSTRUCTIVE SLEEP APNEA 12/08/2008  ? Sleep apnea   ? uses c pap  ? VITAMIN D DEFICIENCY 01/13/2010  ? ? ?Past Surgical History:  ?Procedure Laterality Date  ? APPENDECTOMY  07/23/12  ? CARPAL TUNNEL RELEASE  01/2010  ? bilateral  ? COLONOSCOPY W/ BIOPSIES AND POLYPECTOMY  05/2009  ? 2 tubular adenomas  ? LAPAROSCOPIC  APPENDECTOMY N/A 07/23/2012  ? Procedure: APPENDECTOMY LAPAROSCOPIC;  Surgeon: Gayland Curry, MD;  Location: [facility];  Service: General;  Laterality: N/A;  ? ? ?Social History  ? ?Socioeconomic History  ? Marital status: Single  ?  Spouse name: Not on file  ? Number of children: 2  ? Years of education: Not on file  ? Highest education level: Not on file  ?Occupational History  ? 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  ?  Employer: UNEMPLOYED  ?Tobacco Use  ? Smoking status: Never  ? Smokeless tobacco: Never  ?Vaping Use  ? Vaping Use: Never used  ?Substance and Sexual Activity  ? Alcohol use: No  ?  Alcohol/week: 0.0 standard drinks  ? Drug use: No  ? Sexual activity: Not on file  ?Other Topics Concern  ? Not on file  ?Social History Narrative  ? Daily Caffeine  ? Right handed  ? Two story home  ? ?Social Determinants of Health  ? ?Financial Resource Strain: Not on file  ?Food Insecurity: Not on file  ?Transportation Needs: Not on file  ?Physical Activity: Not on file  ?Stress: Not on file  ?Social Connections: Not on file  ?Intimate Partner Violence: Not on file  ? ? ?Current Outpatient Medications on File Prior to Visit  ?Medication Sig Dispense Refill  ? amLODipine (NORVASC) 10 MG tablet Take 1 tablet (10 mg total) by mouth  daily. 90 tablet 3  ? atorvastatin (LIPITOR) 40 MG tablet TAKE 1 TABLET BY MOUTH  DAILY 100 tablet 2  ? AUBAGIO 14 MG TABS TAKE 1 TABLET BY MOUTH  DAILY 30 tablet 5  ? baclofen (LIORESAL) 10 MG tablet TAKE 1 TABLET BY MOUTH 3 TIMES  DAILY 90 tablet 0  ? cetirizine (ZYRTEC) 10 MG tablet Take 10 mg by mouth daily.      ? Cholecalciferol (VITAMIN D) 2000 UNITS CAPS Take 2 capsules by mouth every morning. Reported on 06/05/2015    ? Continuous Blood Gluc Sensor (FREESTYLE LIBRE 2 SENSOR) MISC 1 Device by Does not apply route every 14 (fourteen) days. 6 each 3  ? cyanocobalamin (,VITAMIN B-12,) 1000 MCG/ML injection Inject into the muscle every 30 (thirty) days.    ? famotidine (PEPCID) 20 MG tablet TAKE 1  TABLET BY MOUTH  TWICE DAILY 180 tablet 3  ? furosemide (LASIX) 40 MG tablet TAKE 1 TABLET BY MOUTH DAILY 90 tablet 3  ? gabapentin (NEURONTIN) 300 MG capsule TAKE 2 CAPSULES BY MOUTH IN THE  MORNING 1 CAPSULE BY MOUTH IN  THE AFTERNOON AND 2 CAPSULES BY  MOUTH IN THE EVENING 450 capsule 3  ? glucose blood (FREESTYLE TEST STRIPS) test strip 1 each by Other route 2 (two) times daily. And lancets, 2 times daily. 180 each 3  ? glucose monitoring kit (FREESTYLE) monitoring kit 1 each by Does not apply route as needed for other. Dispense any model that is covered- dispense testing supplies for Q AC/ HS accuchecks- 1 month supply with one refil. 1 each 1  ? insulin glargine (LANTUS SOLOSTAR) 100 UNIT/ML Solostar Pen Inject 28 Units into the skin at bedtime. 30 mL PRN  ? insulin lispro (HUMALOG KWIKPEN) 100 UNIT/ML KwikPen 3 times a day (just before each meal), 23-18-12 and pen needles 4/day. 60 mL 3  ? Insulin Pen Needle (B-D UF III MINI PEN NEEDLES) 31G X 5 MM MISC USE TO INJECT HUMALOG  KWIKPEN INTO THE SKIN 3  TIMES DAILY 270 each 2  ? loperamide (IMODIUM) 2 MG capsule Take 2 mg by mouth as needed for diarrhea or loose stools.    ? losartan (COZAAR) 100 MG tablet TAKE 1 TABLET BY MOUTH  DAILY 90 tablet 3  ? medroxyPROGESTERone (DEPO-PROVERA) 150 MG/ML injection Inject 150 mg into the muscle every 3 (three) months.      ? meloxicam (MOBIC) 15 MG tablet Take 1 tablet (15 mg total) by mouth daily. 90 tablet 3  ? metFORMIN (GLUCOPHAGE-XR) 500 MG 24 hr tablet TAKE 2 TABLETS BY MOUTH  DAILY 180 tablet 3  ? metoprolol tartrate (LOPRESSOR) 100 MG tablet TAKE 2 TABLETS BY MOUTH  DAILY 180 tablet 3  ? nortriptyline (PAMELOR) 50 MG capsule TAKE 2 CAPSULES BY MOUTH AT BEDTIME 180 capsule 3  ? omeprazole-sodium bicarbonate (ZEGERID) 40-1100 MG per capsule Take 1 capsule by mouth daily.      ? potassium chloride (KLOR-CON) 10 MEQ tablet TAKE 2 TABLETS BY MOUTH  DAILY 180 tablet 3  ? RYBELSUS 14 MG TABS TAKE 1 TABLET BY MOUTH  DAILY  90 tablet 3  ? traMADol (ULTRAM) 50 MG tablet Take 1 tablet (50 mg total) by mouth every 6 (six) hours as needed. 20 tablet 1  ? ?No current facility-administered medications on file prior to visit.  ? ? ?Allergies  ?Allergen Reactions  ? Ciprofloxacin In D5w Rash  ? Penicillins   ?  REACTION: rash  ? Tricor [Fenofibrate]  Rash  ? ? ?Family History  ?Problem Relation Age of Onset  ? Migraines Mother   ? Hypertension Father   ? COPD Father   ? Diabetes Father   ? Prostate cancer Maternal Uncle   ?     prostate cancer  ? Lung cancer Maternal Grandmother   ? Stroke Maternal Grandfather   ? Heart attack Maternal Grandfather   ? Diabetes Other   ? Hypertension Other   ? Asthma Daughter   ? Colon cancer Neg Hx   ? Esophageal cancer Neg Hx   ? Rectal cancer Neg Hx   ? Stomach cancer Neg Hx   ? ? ?BP 128/80   Ht '5\' 2"'  (1.575 m)   Wt 200 lb (90.7 kg)   BMI 36.58 kg/m?  ? ? ?Review of Systems ? ?   ?Objective:  ? Physical Exam ?VITAL SIGNS:  See vs page.   ?GENERAL: no distress.   ? ? ? ?Lab Results  ?Component Value Date  ? HGBA1C 7.2 (A) 08/23/2021  ? ?   ?Assessment & Plan:  ?Insulin-requiring type 2 DM: this is the best control this pt should aim for, given this regimen, which does match insulin to her changing needs throughout the day ? ?Patient Instructions  ?Please continue the same insulin and other medications.  However, try taking the breakfast Humalog at 7AM, to block the blood sugar going up.   ?check your blood sugar twice a day.  vary the time of day when you check, between before the 3 meals, and at bedtime.  also check if you have symptoms of your blood sugar being too high or too low.  please keep a record of the readings and bring it to your next appointment here (or you can bring the meter itself).  You can write it on any piece of paper.  please call us sooner if your blood sugar goes below 70, or if you have a lot of readings over 200.   ?You should have an endocrinology follow-up appointment in 3  months.   ? ? ? ?

## 2021-08-24 NOTE — Progress Notes (Signed)
[employer] Parking permit signed and ready to be mailed to patient.  ?

## 2021-08-29 ENCOUNTER — Other Ambulatory Visit: Payer: Self-pay | Admitting: Neurology

## 2021-09-20 DIAGNOSIS — B078 Other viral warts: Secondary | ICD-10-CM | POA: Diagnosis not present

## 2021-09-20 DIAGNOSIS — B079 Viral wart, unspecified: Secondary | ICD-10-CM | POA: Diagnosis not present

## 2021-09-22 DIAGNOSIS — Z794 Long term (current) use of insulin: Secondary | ICD-10-CM | POA: Diagnosis not present

## 2021-09-22 DIAGNOSIS — E119 Type 2 diabetes mellitus without complications: Secondary | ICD-10-CM | POA: Diagnosis not present

## 2021-10-11 DIAGNOSIS — E119 Type 2 diabetes mellitus without complications: Secondary | ICD-10-CM | POA: Diagnosis not present

## 2021-10-11 DIAGNOSIS — Z794 Long term (current) use of insulin: Secondary | ICD-10-CM | POA: Diagnosis not present

## 2021-10-13 ENCOUNTER — Ambulatory Visit: Payer: Medicare Other

## 2021-10-15 ENCOUNTER — Ambulatory Visit (INDEPENDENT_AMBULATORY_CARE_PROVIDER_SITE_OTHER): Payer: Medicare Other

## 2021-10-15 DIAGNOSIS — Z309 Encounter for contraceptive management, unspecified: Secondary | ICD-10-CM

## 2021-10-15 MED ORDER — MEDROXYPROGESTERONE ACETATE 150 MG/ML IM SUSP
150.0000 mg | Freq: Once | INTRAMUSCULAR | Status: AC
  Administered 2021-10-15: 150 mg via INTRAMUSCULAR

## 2021-10-15 NOTE — Progress Notes (Signed)
Depo Provera given Please co-sign

## 2021-11-11 DIAGNOSIS — Z794 Long term (current) use of insulin: Secondary | ICD-10-CM | POA: Diagnosis not present

## 2021-11-11 DIAGNOSIS — E119 Type 2 diabetes mellitus without complications: Secondary | ICD-10-CM | POA: Diagnosis not present

## 2021-12-08 ENCOUNTER — Ambulatory Visit: Payer: Self-pay | Admitting: Licensed Clinical Social Worker

## 2021-12-08 NOTE — Patient Outreach (Signed)
  Care Coordination   12/08/2021 Name: Courtney [name] MRN: [id number] DOB: [birth year]   Care Coordination Outreach Attempts:  An unsuccessful telephone outreach was attempted today to offer the patient information about available care coordination services as a benefit of their health plan.   Follow Up Plan:  Additional outreach attempts will be made to offer the patient care coordination information and services.   Encounter Outcome:  No Answer  Care Coordination Interventions Activated:  No   Care Coordination Interventions:  No, not indicated    Casimer Lanius, [place] [phone]

## 2021-12-09 ENCOUNTER — Ambulatory Visit (INDEPENDENT_AMBULATORY_CARE_PROVIDER_SITE_OTHER): Payer: Medicare Other | Admitting: Internal Medicine

## 2021-12-09 ENCOUNTER — Encounter: Payer: Self-pay | Admitting: Internal Medicine

## 2021-12-09 VITALS — BP 130/82 | HR 76 | Ht 62.0 in | Wt 192.8 lb

## 2021-12-09 DIAGNOSIS — E1165 Type 2 diabetes mellitus with hyperglycemia: Secondary | ICD-10-CM

## 2021-12-09 DIAGNOSIS — E7800 Pure hypercholesterolemia, unspecified: Secondary | ICD-10-CM | POA: Diagnosis not present

## 2021-12-09 DIAGNOSIS — Z794 Long term (current) use of insulin: Secondary | ICD-10-CM | POA: Diagnosis not present

## 2021-12-09 LAB — POCT GLYCOSYLATED HEMOGLOBIN (HGB A1C): Hemoglobin A1C: 6.6 % — AB (ref 4.0–5.6)

## 2021-12-09 MED ORDER — OZEMPIC (0.25 OR 0.5 MG/DOSE) 2 MG/1.5ML ~~LOC~~ SOPN: 0.5000 mg | PEN_INJECTOR | SUBCUTANEOUS | 3 refills | Status: DC

## 2021-12-09 NOTE — Progress Notes (Signed)
Patient ID: Courtney [name], female   DOB: 1975/12/11, 46 y.o.   MRN: [id number]  HPI: Courtney [name] is a 46 y.o.-year-old female, returning for follow-up for DM2, dx in 2016 (when she presented in hyperglycemic nonketotic hyperosmolar state), insulin-dependent since 2018, uncontrolled, without long-term complications. Pt. previously saw Dr. Loanne Drilling, last visit 3.5 months ago.  Her daughter just had a baby (her first grandchild) and she was living at her house recently.  She had more irregular meals.  Sugars are a little higher.  Reviewed HbA1c: Lab Results  Component Value Date   HGBA1C 7.2 (A) 08/23/2021   HGBA1C 8.2 (H) 06/24/2021   HGBA1C 8.4 (A) 05/07/2021   HGBA1C 8.7 (A) 03/05/2021   HGBA1C 8.0 (A) 01/04/2021   HGBA1C 8.1 (H) 12/24/2020   HGBA1C 7.4 (A) 05/13/2020   HGBA1C 8.0 (A) 03/05/2020   HGBA1C 8.3 (A) 01/03/2020   HGBA1C 7.8 (A) 10/16/2019   Pt is on a regimen of: - Rybelsus 14 mg at waking - Lantus 28 units at bedtime - Lispro 23-18-12 units before meals She tried metformin but this caused nausea. She tried Invokana but this caused vaginitis. Per review of Dr. Cordelia Pen note, she declined weight loss surgery.   Also, per his note, she declined an insulin pump.  Pt checks her sugars >4x a day:   Lowest sugar was 65; she has hypoglycemia awareness at 70.  Highest sugar was 233.  - no CKD, last BUN/creatinine:  Lab Results  Component Value Date   BUN 11 06/24/2021   BUN 9 12/24/2020   CREATININE 0.88 06/24/2021   CREATININE 0.87 12/24/2020  On Cozaar 100 mg daily.  - last set of lipids: Lab Results  Component Value Date   CHOL 117 06/24/2021   HDL 37.40 (L) 06/24/2021   LDLDIRECT 53.0 06/24/2021   TRIG 287.0 (H) 06/24/2021   CHOLHDL 3 06/24/2021  On atorvastatin 40 mg daily.  - last eye exam was in 2018. No DR.   - no numbness and tingling in her feet.  Last foot exam 01/04/2021.  She is on gabapentin 300 mg tablets: 2-1-2.  She also has a history  of HTN, MS, fibromyalgia, chronic fatigue, OSA-on CPAP, GERD, IBS, vitamin D deficiency, B12 anemia.  ROS: + see HPI No increased urination, blurry vision, nausea, chest pain.  Past Medical History:  Diagnosis Date   ALLERGIC RHINITIS 03/13/2007   Allergy    Anemia, B12 deficiency    Anxiety    C. difficile colitis    CARPAL TUNNEL SYNDROME, BILATERAL 01/13/2010   Cervical lesion 07/13/2011   Spine lesion - indeterminate, for f/u MRI may 2013 per neurology   Chronic fatigue    CTS (carpal tunnel syndrome)    Diabetes mellitus without complication ([place])    ESSENTIAL HYPERTENSION 03/13/2007   Fibromyalgia    GERD (gastroesophageal reflux disease)    Heart murmur    Hx of adenomatous polyp of colon 06/09/2009   Hyperlipidemia    HYPERLIPIDEMIA 03/13/2007   Irritable bowel syndrome 05/24/2010   Multiple sclerosis ([place])    OBSTRUCTIVE SLEEP APNEA 12/08/2008   Sleep apnea    uses c pap   VITAMIN D DEFICIENCY 01/13/2010   Past Surgical History:  Procedure Laterality Date   APPENDECTOMY  07/23/12   CARPAL TUNNEL RELEASE  01/2010   bilateral   COLONOSCOPY W/ BIOPSIES AND POLYPECTOMY  05/2009   2 tubular adenomas   LAPAROSCOPIC APPENDECTOMY N/A 07/23/2012   Procedure: APPENDECTOMY LAPAROSCOPIC;  Surgeon: Leighton Ruff  Redmond Pulling, MD;  Location: [facility];  Service: General;  Laterality: N/A;   Social History   Socioeconomic History   Marital status: Single    Spouse name: Not on file   Number of children: 2   Years of education: Not on file   Highest education level: Not on file  Occupational History   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    Employer: UNEMPLOYED  Tobacco Use   Smoking status: Never   Smokeless tobacco: Never  Vaping Use   Vaping Use: Never used  Substance and Sexual Activity   Alcohol use: No    Alcohol/week: 0.0 standard drinks of alcohol   Drug use: No   Sexual activity: Not on file  Other Topics Concern   Not on file  Social History Narrative   Daily Caffeine   Right  handed   Two story home   Social Determinants of Health   Financial Resource Strain: Not on file  Food Insecurity: Not on file  Transportation Needs: Not on file  Physical Activity: Not on file  Stress: Not on file  Social Connections: Not on file  Intimate Partner Violence: Not on file   Current Outpatient Medications on File Prior to Visit  Medication Sig Dispense Refill   amLODipine (NORVASC) 10 MG tablet Take 1 tablet (10 mg total) by mouth daily. 90 tablet 3   atorvastatin (LIPITOR) 40 MG tablet TAKE 1 TABLET BY MOUTH  DAILY 100 tablet 2   AUBAGIO 14 MG TABS TAKE 1 TABLET BY MOUTH  DAILY 30 tablet 5   baclofen (LIORESAL) 10 MG tablet TAKE 1 TABLET BY MOUTH 3 TIMES  DAILY 90 tablet 0   cetirizine (ZYRTEC) 10 MG tablet Take 10 mg by mouth daily.       Cholecalciferol (VITAMIN D) 2000 UNITS CAPS Take 2 capsules by mouth every morning. Reported on 06/05/2015     Continuous Blood Gluc Sensor (FREESTYLE LIBRE 2 SENSOR) MISC 1 Device by Does not apply route every 14 (fourteen) days. 6 each 3   cyanocobalamin (,VITAMIN B-12,) 1000 MCG/ML injection Inject into the muscle every 30 (thirty) days.     famotidine (PEPCID) 20 MG tablet TAKE 1 TABLET BY MOUTH  TWICE DAILY 180 tablet 3   furosemide (LASIX) 40 MG tablet TAKE 1 TABLET BY MOUTH DAILY 90 tablet 3   gabapentin (NEURONTIN) 300 MG capsule TAKE 2 CAPSULES BY MOUTH IN THE  MORNING 1 CAPSULE BY MOUTH IN  THE AFTERNOON AND 2 CAPSULES BY  MOUTH IN THE EVENING 450 capsule 3   glucose blood (FREESTYLE TEST STRIPS) test strip 1 each by Other route 2 (two) times daily. And lancets, 2 times daily. 180 each 3   glucose monitoring kit (FREESTYLE) monitoring kit 1 each by Does not apply route as needed for other. Dispense any model that is covered- dispense testing supplies for Q AC/ HS accuchecks- 1 month supply with one refil. 1 each 1   insulin glargine (LANTUS SOLOSTAR) 100 UNIT/ML Solostar Pen Inject 28 Units into the skin at bedtime. 30 mL PRN    insulin lispro (HUMALOG KWIKPEN) 100 UNIT/ML KwikPen 3 times a day (just before each meal), 23-18-12 and pen needles 4/day. 60 mL 3   Insulin Pen Needle (B-D UF III MINI PEN NEEDLES) 31G X 5 MM MISC USE TO INJECT HUMALOG  KWIKPEN INTO THE SKIN 3  TIMES DAILY 270 each 2   loperamide (IMODIUM) 2 MG capsule Take 2 mg by mouth as needed for diarrhea or  loose stools.     losartan (COZAAR) 100 MG tablet TAKE 1 TABLET BY MOUTH  DAILY 90 tablet 3   medroxyPROGESTERone (DEPO-PROVERA) 150 MG/ML injection Inject 150 mg into the muscle every 3 (three) months.       meloxicam (MOBIC) 15 MG tablet Take 1 tablet (15 mg total) by mouth daily. 90 tablet 3   metFORMIN (GLUCOPHAGE-XR) 500 MG 24 hr tablet TAKE 2 TABLETS BY MOUTH  DAILY 180 tablet 3   metoprolol tartrate (LOPRESSOR) 100 MG tablet TAKE 2 TABLETS BY MOUTH  DAILY 180 tablet 3   nortriptyline (PAMELOR) 50 MG capsule TAKE 2 CAPSULES BY MOUTH AT BEDTIME 180 capsule 3   omeprazole-sodium bicarbonate (ZEGERID) 40-1100 MG per capsule Take 1 capsule by mouth daily.       potassium chloride (KLOR-CON) 10 MEQ tablet TAKE 2 TABLETS BY MOUTH  DAILY 180 tablet 3   RYBELSUS 14 MG TABS TAKE 1 TABLET BY MOUTH  DAILY 90 tablet 3   traMADol (ULTRAM) 50 MG tablet Take 1 tablet (50 mg total) by mouth every 6 (six) hours as needed. 20 tablet 1   No current facility-administered medications on file prior to visit.   Allergies  Allergen Reactions   Ciprofloxacin In D5w Rash   Penicillins     REACTION: rash   Tricor [Fenofibrate] Rash   Family History  Problem Relation Age of Onset   Migraines Mother    Hypertension Father    COPD Father    Diabetes Father    Prostate cancer Maternal Uncle        prostate cancer   Lung cancer Maternal Grandmother    Stroke Maternal Grandfather    Heart attack Maternal Grandfather    Diabetes Other    Hypertension Other    Asthma Daughter    Colon cancer Neg Hx    Esophageal cancer Neg Hx    Rectal cancer Neg Hx    Stomach  cancer Neg Hx     PE: BP 130/82 (BP Location: Left Arm, Patient Position: Sitting, Cuff Size: Normal)   Pulse 76   Ht _0  (1.575 m)   Wt 192 lb 12.8 oz (87.5 kg)   SpO2 99%   BMI 35.26 kg/m  Wt Readings from Last 3 Encounters:  12/09/21 192 lb 12.8 oz (87.5 kg)  08/23/21 200 lb (90.7 kg)  07/06/21 204 lb (92.5 kg)   Constitutional: overweight, in NAD Eyes: no exophthalmos ENT: moist mucous membranes, no thyromegaly, no cervical lymphadenopathy Cardiovascular: RRR, No MRG, + B LE edema (chronic) Respiratory: CTA B Musculoskeletal: no deformities Skin: moist, warm, no rashes Neurological: + tremor with outstretched hands (chronic)  ASSESSMENT: 1. DM2, insulin-dependent, uncontrolled, without long-term complications  2. HL  PLAN:  1. Patient with long-standing, uncontrolled diabetes, on oral antidiabetic regimen, with suboptimal control.  Latest HbA1c was 7.2%, lower, 3 months ago.  At today's visit, HbA1c level is 6.6% (improved). CGM interpretation: -At today's visit, we reviewed her CGM downloads: It appears that 87% of values are in target range (goal >70%), while 13% are higher than 180 (goal <25%), and 0% are lower than 70 (goal <4%).  The calculated average blood sugar is 151.  The projected HbA1c for the next 3 months ([facility]MI) is 6.9%. -Reviewing the CGM trends, sugars appear to be mostly at goal, but they are high in the target range overnight and increasing around the time of waking/drinking coffee and dropping after breakfast.  Sugars appear to be well controlled afterwards. -At  this visit, we discussed about how to take lispro correctly: To vary the doses depending on the meal, and also to inject it 15 minutes before each meal.  She is not using these directions for now.  To improve her blood sugars overnight and first thing in the morning, we could increase her Lantus dose, however, my suggestion was to switch from Rybelsus to [place] for stronger effect.  She agrees with  this.  We will start at 0.5 mg weekly and we can increase this as needed and as tolerated. - I suggested to:  Patient Instructions  Please continue: - Lantus 28 units at bedtime - Lispro 23-18-12 units 15 min before meals (vary the doses depending on the meal)  Start Ozempic 0.5 mg weekly.  Stop Rybelsus 1 week after starting Ozempic.  Please return in 4 months.  - check sugars at different times of the day - check 4x a day, rotating checks - discussed about CBG targets for treatment: 80-130 mg/dL before meals and <180 mg/dL after meals; target HbA1c <7%. - given foot care handout  - given instructions for hypoglycemia management "15-15 rule"  - advised for yearly eye exams - she is long overdue - will check a foot exam at next visit  - Return to clinic in 4 mo   2. HL - Reviewed latest lipid panel from 06/2021: LDL at goal, to excise high, HDL low Lab Results  Component Value Date   CHOL 117 06/24/2021   HDL 37.40 (L) 06/24/2021   LDLDIRECT 53.0 06/24/2021   TRIG 287.0 (H) 06/24/2021   CHOLHDL 3 06/24/2021  - Continues Lipitor 40 mg daily without side effects.   Philemon Kingdom, MD PhD [facility] Endocrinology

## 2021-12-09 NOTE — Patient Instructions (Addendum)
Please continue: - Lantus 28 units at bedtime - Lispro 23-18-12 units 15 min before meals (vary the doses depending on the meal)  Start Ozempic 0.5 mg weekly.  Stop Rybelsus 1 week after starting Ozempic.  Please return in 4 months.  PATIENT INSTRUCTIONS FOR TYPE 2 DIABETES:  DIET AND EXERCISE Diet and exercise is an important part of diabetic treatment.  We recommended aerobic exercise in the form of brisk walking (working between 40-60% of maximal aerobic capacity, similar to brisk walking) for 150 minutes per week (such as 30 minutes five days per week) along with 3 times per week performing 'resistance' training (using various gauge rubber tubes with handles) 5-10 exercises involving the major muscle groups (upper body, lower body and core) performing 10-15 repetitions (or near fatigue) each exercise. Start at half the above goal but build slowly to reach the above goals. If limited by weight, joint pain, or disability, we recommend daily walking in a swimming pool with water up to waist to reduce pressure from joints while allow for adequate exercise.    BLOOD GLUCOSES Monitoring your blood glucoses is important for continued management of your diabetes. Please check your blood glucoses 2-4 times a day: fasting, before meals and at bedtime (you can rotate these measurements - e.g. one day check before the 3 meals, the next day check before 2 of the meals and before bedtime, etc.).   HYPOGLYCEMIA (low blood sugar) Hypoglycemia is usually a reaction to not eating, exercising, or taking too much insulin/ other diabetes drugs.  Symptoms include tremors, sweating, hunger, confusion, headache, etc. Treat IMMEDIATELY with 15 grams of Carbs: 4 glucose tablets  cup regular juice/soda 2 tablespoons raisins 4 teaspoons sugar 1 tablespoon honey Recheck blood glucose in 15 mins and repeat above if still symptomatic/blood glucose <100.  RECOMMENDATIONS TO REDUCE YOUR RISK OF DIABETIC  COMPLICATIONS: * Take your prescribed MEDICATION(S) * Follow a DIABETIC diet: Complex carbs, fiber rich foods, (monounsaturated and polyunsaturated) fats * AVOID saturated/trans fats, high fat foods, >2,300 mg salt per day. * EXERCISE at least 5 times a week for 30 minutes or preferably daily.  * DO NOT SMOKE OR DRINK more than 1 drink a day. * Check your FEET every day. Do not wear tightfitting shoes. Contact us if you develop an ulcer * See your EYE doctor once a year or more if needed * Get a FLU shot once a year * Get a PNEUMONIA vaccine once before and once after age 23 years  GOALS:  * Your Hemoglobin A1c of <7%  * fasting sugars need to be <130 * after meals sugars need to be <180 (2h after you start eating) * Your Systolic BP should be 440 or lower  * Your Diastolic BP should be 80 or lower  * Your HDL (Good Cholesterol) should be 40 or higher  * Your LDL (Bad Cholesterol) should be 100 or lower. * Your Triglycerides should be 150 or lower  * Your Urine microalbumin (kidney function) should be <30 * Your Body Mass Index should be 25 or lower    Please consider the following ways to cut down carbs and fat and increase fiber and micronutrients in your diet: - substitute whole grain for white bread or pasta - substitute brown rice for white rice - substitute 90-calorie flat bread pieces for slices of bread when possible - substitute sweet potatoes or yams for white potatoes - substitute humus for margarine - substitute tofu for cheese when possible - substitute  almond or rice milk for regular milk (would not drink soy milk daily due to concern for soy estrogen influence on breast cancer risk) - substitute dark chocolate for other sweets when possible - substitute water - can add lemon or orange slices for taste - for diet sodas (artificial sweeteners will trick your body that you can eat sweets without getting calories and will lead you to overeating and weight gain in the long  run) - do not skip breakfast or other meals (this will slow down the metabolism and will result in more weight gain over time)  - can try smoothies made from fruit and almond/rice milk in am instead of regular breakfast - can also try old-fashioned (not instant) oatmeal made with almond/rice milk in am - order the dressing on the side when eating salad at a restaurant (pour less than half of the dressing on the salad) - eat as little meat as possible - can try juicing, but should not forget that juicing will get rid of the fiber, so would alternate with eating raw veg./fruits or drinking smoothies - use as little oil as possible, even when using olive oil - can dress a salad with a mix of balsamic vinegar and lemon juice, for e.g. - use agave nectar, stevia sugar, or regular sugar rather than artificial sweateners - steam or broil/roast veggies  - snack on veggies/fruit/nuts (unsalted, preferably) when possible, rather than processed foods - reduce or eliminate aspartame in diet (it is in diet sodas, chewing gum, etc) Read the labels!  Try to read Dr. Janene Harvey book: "Program for Reversing Diabetes" for other ideas for healthy eating.

## 2021-12-16 ENCOUNTER — Ambulatory Visit: Payer: Self-pay

## 2021-12-16 NOTE — Patient Outreach (Signed)
  Care Coordination   12/16/2021 Name: Courtney [name] MRN: [id number] DOB: 1976-03-22   Care Coordination Outreach Attempts:  A second unsuccessful outreach was attempted today to offer the patient with information about available care coordination services as a benefit of their health plan.     Follow Up Plan:  Additional outreach attempts will be made to offer the patient care coordination information and services.   Encounter Outcome:  No Answer  Care Coordination Interventions Activated:  No   Care Coordination Interventions:  No, not indicated    Thea Silversmith, RN, MSN, BSN, CCM Care Coordinator [phone]

## 2022-01-03 ENCOUNTER — Ambulatory Visit: Payer: Medicare Other | Admitting: Internal Medicine

## 2022-01-04 ENCOUNTER — Telehealth: Payer: Self-pay

## 2022-01-04 NOTE — Patient Outreach (Signed)
  Care Coordination   01/04/2022 Name: Courtney [name] MRN: [id number] DOB: [birth year]   Care Coordination Outreach Attempts:  A third unsuccessful outreach was attempted today to offer the patient with information about available care coordination services as a benefit of their health plan.   Follow Up Plan:  No further outreach attempts will be made at this time. We have been unable to contact the patient to offer or enroll patient in care coordination services  Encounter Outcome:  No Answer  Care Coordination Interventions Activated:  No   Care Coordination Interventions:  No, not indicated    Thea Silversmith, RN, MSN, BSN, [place] Coordinator [phone]

## 2022-01-04 NOTE — Progress Notes (Deleted)
NEUROLOGY FOLLOW UP OFFICE NOTE  Courtney [name] [id number]  Assessment/Plan:   Multiple sclerosis    DMT:  Rogelia Rohrer Pain management:  Gabapentin 666m/300mg/600mg, baclofen, nortriptyline 1025mat bedtime D3 10,000 IU daily Check CBC with diff, CMP, vit D in 6 months Follow up 6 months.      Subjective:  Courtney Philemons a [age]ear old right-handed Caucasian woman who follows up for multiple sclerosis.   UPDATE: Current DMT:  [facility] Other current medications: baclofen 10 mg, gabapentin 60069m00mg/600mg, D3 10000 IU daily, B12 1000m5mnjection every 30 days, nortriptyline 100 mg   Vision:No issues Motor: notes residual right upper and lower extremity weakness.  Notes grip in both hands aren't as strong. Sensory: may get facial numbness with heat and stress.  Pain: controlled.   Gait: balance okay but feels that she is walking a little slower. Bowel/Bladder:  No change Fatigue: No Cognition: Sometimes she has trouble remembering new information Mood: Stable     HISTORY: Onset of symptoms and diagnosis occurred in 2006.  She experienced an episode of numbness in her right arm and both lower extremities.  She was dropping objects.  MRI of brain reportedly showed "solitary right mid-to-posterior frontal periventricular white matter lesion".  MRI of spine revealed no cord demyelination.  CSF analysis from lumbar puncture reportedly revealed oligoclonal bands and an elevated IgG index.  She was diagnosed with clinically isolated syndrome but was officially diagnosed with multiple sclerosis a few months later when she developed a relapse with left hand numbness.  In February 2013, she developed left arm pain and swelling.  MRI of cervical spine revealed an enhancing spinal cord lesion at C4 on the right.  She was treated with IV Solumedrol.  In 2014, she had an episode of optic neuritis in her right eye, treated with IV Solumedrol.  In September-October 2019, she reported to 4 weeks  history of tingling in her face, which may occur off and on for 10 minutes 3 times a week.  She was prescribed a prednisone taper.  Compared to prior MRI from 02/07/2017, repeat MRI of the brain and cervical spine with and without contrast from 02/19/2018, which were personally reviewed, demonstrated mildly progressed bifrontal cerebral white matter changes as well as probable additional abnormal signal in the left cord at the C6-7 level but no abnormal enhancement to suggest active demyelinating disease. Questionable if paresthesias were secondary to B12 deficiency as well.   Other history:  History of migraines and ocular migraines   There is no family history of MS.   Past disease modifying therapy:  Avonex (difficulty injecting), Rebif (painful injections), Tecfidera (GI side effects)   Imaging: 06/03/2011:  MRI brain with and without contrast:  single right frontal periventricular white matter lesion with high T2 FLAIR signal 06/03/2011:  MRI cervical spine with and without contrast:  abnormal rim enhancing lesion in the spinal cord at the level of C4 on the right which mildly expands the cord. 01/21/2012:  MRI brain with and without contrast:  unchanged nonspecific right periventricular white matter lesion 01/21/2012: MRI cervical spine with and without contrast: stable nonenhancing cord lesion at C4 11/14/2012: MRI brain with and without contrast.  No change 11/14/2012:  MRI cervical spine with and without contrast:  No change 12/02/2013: MRI brain with and without contrast:  1. No substantial change in the right frontal juxtacortical, right corona radiata, and left periatrial white matter T2/FLAIR hyperintensities. While nonspecific, these likely reflect demyelinating lesions in this patient  with a history of multiple sclerosis and concomitant cervical cord lesion. No new lesions are identified and none of the existing lesions enhance or restrict diffusion.  2. Subcentimeter left parafalcine  dural based lesion as described above has slowly enlarged over time and demonstrates equivocal enhancement, but may represent a meningioma.  Recommend attention on follow up imaging. 12/02/2013:  MRI cervical spine with and without contrast:  No change 12/15/2014:  MRI brain with and without contrast:  1.  Similar appearance of focal T2/FLAIR hyperintense white matter lesion within the right lateral body of the corpus callosum without associated enhancement to suggest active demyelination.  2.  No new or enlarging white matter lesions identified.  3.  Slowly enlarging nonenhancing nodular lesion along the left anterior aspect of the falx cerebri, which may reflect a small meningioma. 12/15/2014:  MRI cervical spine with and without contrast:  no change 10/10/2015:  MRI brain with and without contrast:  no change 10/10/2015:  MRI cervical spine with and without contrast:  no change 02/07/2017:  MRI brain with and without contrast: No significant change since 12/24/2008 02/07/2017: MRI cervical spine with and without contrast: Chronic lesion at C4 level, stable since 2013 02/19/2018:  MRI brain with and without contrast: Compared to prior imaging from 02/07/17, mildly progressed bifrontal cerebral white matter changes, right greater than left, but no abnormal enhancement to suggest active disease. 02/19/2018:  MRI cervical spine with and without contrast: Again demonstrated chronic lesion at C4 level as well as probable subtle patchy cord signal abnormality within the left cord at level of C6-7 not definitely seen on prior imaging from 02/07/17 but no abnormal enhancement to suggest active disease. 10/30/2019 MRI brain and cervical spine with and without contrast:  no significant change compared to prior imaging from 2019 01/23/2021 MRI brain and cervical spine with and without contrast:  stable compared to imaging from 10/30/2019  PAST MEDICAL HISTORY: Past Medical History:  Diagnosis Date   ALLERGIC  RHINITIS 03/13/2007   Allergy    Anemia, B12 deficiency    Anxiety    C. difficile colitis    CARPAL TUNNEL SYNDROME, BILATERAL 01/13/2010   Cervical lesion 07/13/2011   Spine lesion - indeterminate, for f/u MRI may 2013 per neurology   Chronic fatigue    CTS (carpal tunnel syndrome)    Diabetes mellitus without complication ([place])    ESSENTIAL HYPERTENSION 03/13/2007   Fibromyalgia    GERD (gastroesophageal reflux disease)    Heart murmur    Hx of adenomatous polyp of colon 06/09/2009   Hyperlipidemia    HYPERLIPIDEMIA 03/13/2007   Irritable bowel syndrome 05/24/2010   Multiple sclerosis ([place])    OBSTRUCTIVE SLEEP APNEA 12/08/2008   Sleep apnea    uses c pap   VITAMIN D DEFICIENCY 01/13/2010    MEDICATIONS: Current Outpatient Medications on File Prior to Visit  Medication Sig Dispense Refill   amLODipine (NORVASC) 10 MG tablet Take 1 tablet (10 mg total) by mouth daily. 90 tablet 3   atorvastatin (LIPITOR) 40 MG tablet TAKE 1 TABLET BY MOUTH  DAILY 100 tablet 2   AUBAGIO 14 MG TABS TAKE 1 TABLET BY MOUTH  DAILY 30 tablet 5   baclofen (LIORESAL) 10 MG tablet TAKE 1 TABLET BY MOUTH 3 TIMES  DAILY 90 tablet 0   cetirizine (ZYRTEC) 10 MG tablet Take 10 mg by mouth daily.       Cholecalciferol (VITAMIN D) 2000 UNITS CAPS Take 2 capsules by mouth every morning. Reported on 06/05/2015  Continuous Blood Gluc Sensor (FREESTYLE LIBRE 2 SENSOR) MISC 1 Device by Does not apply route every 14 (fourteen) days. 6 each 3   cyanocobalamin (,VITAMIN B-12,) 1000 MCG/ML injection Inject into the muscle every 30 (thirty) days.     famotidine (PEPCID) 20 MG tablet TAKE 1 TABLET BY MOUTH  TWICE DAILY 180 tablet 3   furosemide (LASIX) 40 MG tablet TAKE 1 TABLET BY MOUTH DAILY 90 tablet 3   gabapentin (NEURONTIN) 300 MG capsule TAKE 2 CAPSULES BY MOUTH IN THE  MORNING 1 CAPSULE BY MOUTH IN  THE AFTERNOON AND 2 CAPSULES BY  MOUTH IN THE EVENING 450 capsule 3   glucose blood (FREESTYLE TEST STRIPS) test  strip 1 each by Other route 2 (two) times daily. And lancets, 2 times daily. 180 each 3   glucose monitoring kit (FREESTYLE) monitoring kit 1 each by Does not apply route as needed for other. Dispense any model that is covered- dispense testing supplies for Q AC/ HS accuchecks- 1 month supply with one refil. 1 each 1   insulin glargine (LANTUS SOLOSTAR) 100 UNIT/ML Solostar Pen Inject 28 Units into the skin at bedtime. 30 mL PRN   insulin lispro (HUMALOG KWIKPEN) 100 UNIT/ML KwikPen 3 times a day (just before each meal), 23-18-12 and pen needles 4/day. 60 mL 3   Insulin Pen Needle (B-D UF III MINI PEN NEEDLES) 31G X 5 MM MISC USE TO INJECT HUMALOG  KWIKPEN INTO THE SKIN 3  TIMES DAILY 270 each 2   loperamide (IMODIUM) 2 MG capsule Take 2 mg by mouth as needed for diarrhea or loose stools.     losartan (COZAAR) 100 MG tablet TAKE 1 TABLET BY MOUTH  DAILY 90 tablet 3   medroxyPROGESTERone (DEPO-PROVERA) 150 MG/ML injection Inject 150 mg into the muscle every 3 (three) months.       meloxicam (MOBIC) 15 MG tablet Take 1 tablet (15 mg total) by mouth daily. 90 tablet 3   metFORMIN (GLUCOPHAGE-XR) 500 MG 24 hr tablet TAKE 2 TABLETS BY MOUTH  DAILY 180 tablet 3   metoprolol tartrate (LOPRESSOR) 100 MG tablet TAKE 2 TABLETS BY MOUTH  DAILY 180 tablet 3   nortriptyline (PAMELOR) 50 MG capsule TAKE 2 CAPSULES BY MOUTH AT BEDTIME 180 capsule 3   omeprazole-sodium bicarbonate (ZEGERID) 40-1100 MG per capsule Take 1 capsule by mouth daily.       potassium chloride (KLOR-CON) 10 MEQ tablet TAKE 2 TABLETS BY MOUTH  DAILY 180 tablet 3   Semaglutide,0.25 or 0.5MG/DOS, (OZEMPIC, 0.25 OR 0.5 MG/DOSE,) 2 MG/1.5ML SOPN Inject 0.5 mg into the skin once a week. 4.5 mL 3   traMADol (ULTRAM) 50 MG tablet Take 1 tablet (50 mg total) by mouth every 6 (six) hours as needed. 20 tablet 1   No current facility-administered medications on file prior to visit.    ALLERGIES: Allergies  Allergen Reactions   Ciprofloxacin In  D5w Rash   Penicillins     REACTION: rash   Tricor [Fenofibrate] Rash    FAMILY HISTORY: Family History  Problem Relation Age of Onset   Migraines Mother    Hypertension Father    COPD Father    Diabetes Father    Prostate cancer Maternal Uncle        prostate cancer   Lung cancer Maternal Grandmother    Stroke Maternal Grandfather    Heart attack Maternal Grandfather    Diabetes Other    Hypertension Other    Asthma Daughter  Colon cancer Neg Hx    Esophageal cancer Neg Hx    Rectal cancer Neg Hx    Stomach cancer Neg Hx       Objective:  *** General: No acute distress.  Patient appears well-groomed.   Head:  Normocephalic/atraumatic Eyes:  Fundi examined but not visualized Neurological Exam: alert and oriented to person, place, and time.  Speech fluent and not dysarthric, language intact.  CN II-XII intact.  Bulk and tone normal, muscle strength 5-/5 bilateral deltoids, right grip and right lower extremity, otherwise 5/5 throughout.  Slight postural tremor in right hand.  Sensation to pinprick reduced in right hand but otherwise pinprick and vibration intact.  Deep tendon reflexes 2+ throughout, toes downgoing.  Finger to nose testing intact.  Gait slightly broad-based and cautious but steady.  Romberg negative.   Courtney Clines, DO  CC: Courtney Cower, MD

## 2022-01-06 ENCOUNTER — Ambulatory Visit: Payer: Medicare Other | Admitting: Neurology

## 2022-01-06 DIAGNOSIS — Z794 Long term (current) use of insulin: Secondary | ICD-10-CM | POA: Diagnosis not present

## 2022-01-06 DIAGNOSIS — E119 Type 2 diabetes mellitus without complications: Secondary | ICD-10-CM | POA: Diagnosis not present

## 2022-01-07 ENCOUNTER — Ambulatory Visit: Payer: Medicare Other

## 2022-01-07 ENCOUNTER — Other Ambulatory Visit (INDEPENDENT_AMBULATORY_CARE_PROVIDER_SITE_OTHER): Payer: Medicare Other

## 2022-01-07 ENCOUNTER — Ambulatory Visit (INDEPENDENT_AMBULATORY_CARE_PROVIDER_SITE_OTHER): Payer: Medicare Other | Admitting: Internal Medicine

## 2022-01-07 ENCOUNTER — Encounter: Payer: Self-pay | Admitting: Internal Medicine

## 2022-01-07 ENCOUNTER — Ambulatory Visit (INDEPENDENT_AMBULATORY_CARE_PROVIDER_SITE_OTHER): Payer: Medicare Other | Admitting: Neurology

## 2022-01-07 ENCOUNTER — Encounter: Payer: Self-pay | Admitting: Neurology

## 2022-01-07 VITALS — BP 126/79 | HR 85 | Resp 18 | Ht 61.0 in | Wt 190.0 lb

## 2022-01-07 VITALS — BP 120/85 | HR 93 | Temp 99.1°F | Ht 61.0 in | Wt 190.0 lb

## 2022-01-07 DIAGNOSIS — Z1159 Encounter for screening for other viral diseases: Secondary | ICD-10-CM

## 2022-01-07 DIAGNOSIS — E538 Deficiency of other specified B group vitamins: Secondary | ICD-10-CM | POA: Diagnosis not present

## 2022-01-07 DIAGNOSIS — Z794 Long term (current) use of insulin: Secondary | ICD-10-CM | POA: Diagnosis not present

## 2022-01-07 DIAGNOSIS — I10 Essential (primary) hypertension: Secondary | ICD-10-CM | POA: Diagnosis not present

## 2022-01-07 DIAGNOSIS — G35 Multiple sclerosis: Secondary | ICD-10-CM

## 2022-01-07 DIAGNOSIS — E559 Vitamin D deficiency, unspecified: Secondary | ICD-10-CM | POA: Diagnosis not present

## 2022-01-07 DIAGNOSIS — E7800 Pure hypercholesterolemia, unspecified: Secondary | ICD-10-CM | POA: Diagnosis not present

## 2022-01-07 DIAGNOSIS — M5432 Sciatica, left side: Secondary | ICD-10-CM

## 2022-01-07 DIAGNOSIS — Z23 Encounter for immunization: Secondary | ICD-10-CM | POA: Diagnosis not present

## 2022-01-07 DIAGNOSIS — E1165 Type 2 diabetes mellitus with hyperglycemia: Secondary | ICD-10-CM

## 2022-01-07 DIAGNOSIS — Z01419 Encounter for gynecological examination (general) (routine) without abnormal findings: Secondary | ICD-10-CM

## 2022-01-07 LAB — URINALYSIS, ROUTINE W REFLEX MICROSCOPIC
Bilirubin Urine: NEGATIVE
Hgb urine dipstick: NEGATIVE
Ketones, ur: NEGATIVE
Leukocytes,Ua: NEGATIVE
Nitrite: NEGATIVE
Specific Gravity, Urine: >=1.03 — AB (ref 1.000–1.030)
Urine Glucose: NEGATIVE
Urobilinogen, UA: 0.2 (ref 0.0–1.0)
pH: 5.5 (ref 5.0–8.0)

## 2022-01-07 LAB — CBC WITH DIFFERENTIAL/PLATELET
Basophils Absolute: 0.1 10*3/uL (ref 0.0–0.1)
Basophils Relative: 1.1 % (ref 0.0–3.0)
Eosinophils Absolute: 0.4 10*3/uL (ref 0.0–0.7)
Eosinophils Relative: 4.6 % (ref 0.0–5.0)
HCT: 36.1 % (ref 36.0–46.0)
Hemoglobin: 12 g/dL (ref 12.0–15.0)
Lymphocytes Relative: 16.4 % (ref 12.0–46.0)
Lymphs Abs: 1.4 10*3/uL (ref 0.7–4.0)
MCHC: 33.3 g/dL (ref 30.0–36.0)
MCV: 83 fl (ref 78.0–100.0)
Monocytes Absolute: 0.4 10*3/uL (ref 0.1–1.0)
Monocytes Relative: 4.8 % (ref 3.0–12.0)
Neutro Abs: 6.1 10*3/uL (ref 1.4–7.7)
Neutrophils Relative %: 73.1 % (ref 43.0–77.0)
Platelets: 288 10*3/uL (ref 150.0–400.0)
RBC: 4.35 Mil/uL (ref 3.87–5.11)
RDW: 15.2 % (ref 11.5–15.5)
WBC: 8.4 10*3/uL (ref 4.0–10.5)

## 2022-01-07 LAB — MICROALBUMIN / CREATININE URINE RATIO
Creatinine,U: 253.5 mg/dL
Microalb Creat Ratio: 0.7 mg/g (ref 0.0–30.0)
Microalb, Ur: 1.8 mg/dL (ref 0.0–1.9)

## 2022-01-07 LAB — BASIC METABOLIC PANEL
BUN: 14 mg/dL (ref 6–23)
CO2: 25 mEq/L (ref 19–32)
Calcium: 9.5 mg/dL (ref 8.4–10.5)
Chloride: 104 mEq/L (ref 96–112)
Creatinine, Ser: 1.06 mg/dL (ref 0.40–1.20)
GFR: 63.38 mL/min (ref >60.00)
Glucose, Bld: 133 mg/dL — ABNORMAL HIGH (ref 70–99)
Potassium: 3.9 mEq/L (ref 3.5–5.1)
Sodium: 138 mEq/L (ref 135–145)

## 2022-01-07 LAB — LIPID PANEL
Cholesterol: 106 mg/dL (ref 0–200)
HDL: 33.2 mg/dL — ABNORMAL LOW (ref >39.00)
LDL Cholesterol: 37 mg/dL (ref 0–99)
NonHDL: 73.1
Total CHOL/HDL Ratio: 3
Triglycerides: 182 mg/dL — ABNORMAL HIGH (ref 0.0–149.0)
VLDL: 36.4 mg/dL (ref 0.0–40.0)

## 2022-01-07 LAB — TSH: TSH: 1.49 u[IU]/mL (ref 0.35–5.50)

## 2022-01-07 LAB — HEPATIC FUNCTION PANEL
ALT: 15 U/L (ref 0–35)
AST: 10 U/L (ref 0–37)
Albumin: 4.2 g/dL (ref 3.5–5.2)
Alkaline Phosphatase: 72 U/L (ref 39–117)
Bilirubin, Direct: 0.1 mg/dL (ref 0.0–0.3)
Total Bilirubin: 0.5 mg/dL (ref 0.2–1.2)
Total Protein: 7.1 g/dL (ref 6.0–8.3)

## 2022-01-07 MED ORDER — MEDROXYPROGESTERONE ACETATE 150 MG/ML IM SUSP
150.0000 mg | Freq: Once | INTRAMUSCULAR | Status: AC
  Administered 2022-01-07: 150 mg via INTRAMUSCULAR

## 2022-01-07 MED ORDER — CYANOCOBALAMIN 1000 MCG/ML IJ SOLN
1000.0000 ug | Freq: Once | INTRAMUSCULAR | Status: AC
  Administered 2022-01-07: 1000 ug via INTRAMUSCULAR

## 2022-01-07 NOTE — Progress Notes (Signed)
NEUROLOGY FOLLOW UP OFFICE NOTE  Courtney [name] [id number]  Assessment/Plan:   1  Multiple sclerosis 2  Left sciatica    DMT:  [facility] Pain management:  Gabapentin 684m/300mg/600mg, baclofen, nortriptyline 108mat bedtime Refer to physical therapy for treatment of sciatica D3 10,000 IU daily Check vit D Plan to repeat MRIs in one year Follow up 6 months.      Subjective:  Courtney [name] a [age]ear old right-handed Caucasian woman who follows up for multiple sclerosis.   UPDATE: Current DMT:  [facility] Other current medications: baclofen 10 mg, gabapentin 60073m00mg/600mg, D3 10000 IU daily, B12 1000m24mnjection every 30 days, nortriptyline 100 mg  CBC and CMP today unremarkable.  Vision:No issues Motor: notes residual right upper and lower extremity weakness.  Notes grip in both hands aren't as strong. Sensory: may get facial numbness with heat and stress.  Pain: Having left sided sciatica for a month.  No trauma or exertional activity.  No numbness or weakness.  Flares up once a day but brief.   Gait: balance okay but feels that she is walking a little slower. Bowel/Bladder:  No change Fatigue: No Cognition: Sometimes she has trouble remembering new information Mood: Stable     HISTORY: Onset of symptoms and diagnosis occurred in 2006.  She experienced an episode of numbness in her right arm and both lower extremities.  She was dropping objects.  MRI of brain reportedly showed "solitary right mid-to-posterior frontal periventricular white matter lesion".  MRI of spine revealed no cord demyelination.  CSF analysis from lumbar puncture reportedly revealed oligoclonal bands and an elevated IgG index.  She was diagnosed with clinically isolated syndrome but was officially diagnosed with multiple sclerosis a few months later when she developed a relapse with left hand numbness.  In February 2013, she developed left arm pain and swelling.  MRI of cervical spine revealed an  enhancing spinal cord lesion at C4 on the right.  She was treated with IV Solumedrol.  In 2014, she had an episode of optic neuritis in her right eye, treated with IV Solumedrol.  In September-October 2019, she reported to 4 weeks history of tingling in her face, which may occur off and on for 10 minutes 3 times a week.  She was prescribed a prednisone taper.  Compared to prior MRI from 02/07/2017, repeat MRI of the brain and cervical spine with and without contrast from 02/19/2018, which were personally reviewed, demonstrated mildly progressed bifrontal cerebral white matter changes as well as probable additional abnormal signal in the left cord at the C6-7 level but no abnormal enhancement to suggest active demyelinating disease. Questionable if paresthesias were secondary to B12 deficiency as well.   Other history:  History of migraines and ocular migraines   There is no family history of MS.   Past disease modifying therapy:  Avonex (difficulty injecting), Rebif (painful injections), Tecfidera (GI side effects)   Imaging: 06/03/2011:  MRI brain with and without contrast:  single right frontal periventricular white matter lesion with high T2 FLAIR signal 06/03/2011:  MRI cervical spine with and without contrast:  abnormal rim enhancing lesion in the spinal cord at the level of C4 on the right which mildly expands the cord. 01/21/2012:  MRI brain with and without contrast:  unchanged nonspecific right periventricular white matter lesion 01/21/2012: MRI cervical spine with and without contrast: stable nonenhancing cord lesion at C4 11/14/2012: MRI brain with and without contrast.  No change 11/14/2012:  MRI cervical spine  with and without contrast:  No change 12/02/2013: MRI brain with and without contrast:  1. No substantial change in the right frontal juxtacortical, right corona radiata, and left periatrial white matter T2/FLAIR hyperintensities. While nonspecific, these likely reflect demyelinating  lesions in this patient with a history of multiple sclerosis and concomitant cervical cord lesion. No new lesions are identified and none of the existing lesions enhance or restrict diffusion.  2. Subcentimeter left parafalcine dural based lesion as described above has slowly enlarged over time and demonstrates equivocal enhancement, but may represent a meningioma.  Recommend attention on follow up imaging. 12/02/2013:  MRI cervical spine with and without contrast:  No change 12/15/2014:  MRI brain with and without contrast:  1.  Similar appearance of focal T2/FLAIR hyperintense white matter lesion within the right lateral body of the corpus callosum without associated enhancement to suggest active demyelination.  2.  No new or enlarging white matter lesions identified.  3.  Slowly enlarging nonenhancing nodular lesion along the left anterior aspect of the falx cerebri, which may reflect a small meningioma. 12/15/2014:  MRI cervical spine with and without contrast:  no change 10/10/2015:  MRI brain with and without contrast:  no change 10/10/2015:  MRI cervical spine with and without contrast:  no change 02/07/2017:  MRI brain with and without contrast: No significant change since 12/24/2008 02/07/2017: MRI cervical spine with and without contrast: Chronic lesion at C4 level, stable since 2013 02/19/2018:  MRI brain with and without contrast: Compared to prior imaging from 02/07/17, mildly progressed bifrontal cerebral white matter changes, right greater than left, but no abnormal enhancement to suggest active disease. 02/19/2018:  MRI cervical spine with and without contrast: Again demonstrated chronic lesion at C4 level as well as probable subtle patchy cord signal abnormality within the left cord at level of C6-7 not definitely seen on prior imaging from 02/07/17 but no abnormal enhancement to suggest active disease. 10/30/2019 MRI brain and cervical spine with and without contrast:  no significant change  compared to prior imaging from 2019 01/23/2021 MRI brain and cervical spine with and without contrast:  stable compared to imaging from 10/30/2019  PAST MEDICAL HISTORY: Past Medical History:  Diagnosis Date   ALLERGIC RHINITIS 03/13/2007   Allergy    Anemia, B12 deficiency    Anxiety    C. difficile colitis    CARPAL TUNNEL SYNDROME, BILATERAL 01/13/2010   Cervical lesion 07/13/2011   Spine lesion - indeterminate, for f/u MRI may 2013 per neurology   Chronic fatigue    CTS (carpal tunnel syndrome)    Diabetes mellitus without complication ([place])    ESSENTIAL HYPERTENSION 03/13/2007   Fibromyalgia    GERD (gastroesophageal reflux disease)    Heart murmur    Hx of adenomatous polyp of colon 06/09/2009   Hyperlipidemia    HYPERLIPIDEMIA 03/13/2007   Irritable bowel syndrome 05/24/2010   Multiple sclerosis ([place])    OBSTRUCTIVE SLEEP APNEA 12/08/2008   Sleep apnea    uses c pap   VITAMIN D DEFICIENCY 01/13/2010    MEDICATIONS: Current Outpatient Medications on File Prior to Visit  Medication Sig Dispense Refill   amLODipine (NORVASC) 10 MG tablet Take 1 tablet (10 mg total) by mouth daily. 90 tablet 3   atorvastatin (LIPITOR) 40 MG tablet TAKE 1 TABLET BY MOUTH  DAILY 100 tablet 2   AUBAGIO 14 MG TABS TAKE 1 TABLET BY MOUTH  DAILY 30 tablet 5   baclofen (LIORESAL) 10 MG tablet TAKE 1 TABLET  BY MOUTH 3 TIMES  DAILY 90 tablet 0   cetirizine (ZYRTEC) 10 MG tablet Take 10 mg by mouth daily.       Cholecalciferol (VITAMIN D) 2000 UNITS CAPS Take 2 capsules by mouth every morning. Reported on 06/05/2015     Continuous Blood Gluc Sensor (FREESTYLE LIBRE 2 SENSOR) MISC 1 Device by Does not apply route every 14 (fourteen) days. 6 each 3   cyanocobalamin (,VITAMIN B-12,) 1000 MCG/ML injection Inject into the muscle every 30 (thirty) days.     famotidine (PEPCID) 20 MG tablet TAKE 1 TABLET BY MOUTH  TWICE DAILY 180 tablet 3   furosemide (LASIX) 40 MG tablet TAKE 1 TABLET BY MOUTH DAILY 90 tablet 3    gabapentin (NEURONTIN) 300 MG capsule TAKE 2 CAPSULES BY MOUTH IN THE  MORNING 1 CAPSULE BY MOUTH IN  THE AFTERNOON AND 2 CAPSULES BY  MOUTH IN THE EVENING 450 capsule 3   glucose blood (FREESTYLE TEST STRIPS) test strip 1 each by Other route 2 (two) times daily. And lancets, 2 times daily. 180 each 3   glucose monitoring kit (FREESTYLE) monitoring kit 1 each by Does not apply route as needed for other. Dispense any model that is covered- dispense testing supplies for Q AC/ HS accuchecks- 1 month supply with one refil. 1 each 1   insulin glargine (LANTUS SOLOSTAR) 100 UNIT/ML Solostar Pen Inject 28 Units into the skin at bedtime. 30 mL PRN   insulin lispro (HUMALOG KWIKPEN) 100 UNIT/ML KwikPen 3 times a day (just before each meal), 23-18-12 and pen needles 4/day. 60 mL 3   Insulin Pen Needle (B-D UF III MINI PEN NEEDLES) 31G X 5 MM MISC USE TO INJECT HUMALOG  KWIKPEN INTO THE SKIN 3  TIMES DAILY 270 each 2   loperamide (IMODIUM) 2 MG capsule Take 2 mg by mouth as needed for diarrhea or loose stools.     losartan (COZAAR) 100 MG tablet TAKE 1 TABLET BY MOUTH  DAILY 90 tablet 3   medroxyPROGESTERone (DEPO-PROVERA) 150 MG/ML injection Inject 150 mg into the muscle every 3 (three) months.       meloxicam (MOBIC) 15 MG tablet Take 1 tablet (15 mg total) by mouth daily. 90 tablet 3   metFORMIN (GLUCOPHAGE-XR) 500 MG 24 hr tablet TAKE 2 TABLETS BY MOUTH  DAILY 180 tablet 3   metoprolol tartrate (LOPRESSOR) 100 MG tablet TAKE 2 TABLETS BY MOUTH  DAILY 180 tablet 3   nortriptyline (PAMELOR) 50 MG capsule TAKE 2 CAPSULES BY MOUTH AT BEDTIME 180 capsule 3   omeprazole-sodium bicarbonate (ZEGERID) 40-1100 MG per capsule Take 1 capsule by mouth daily.       potassium chloride (KLOR-CON) 10 MEQ tablet TAKE 2 TABLETS BY MOUTH  DAILY 180 tablet 3   Semaglutide,0.25 or 0.5MG/DOS, (OZEMPIC, 0.25 OR 0.5 MG/DOSE,) 2 MG/1.5ML SOPN Inject 0.5 mg into the skin once a week. 4.5 mL 3   traMADol (ULTRAM) 50 MG tablet Take  1 tablet (50 mg total) by mouth every 6 (six) hours as needed. 20 tablet 1   No current facility-administered medications on file prior to visit.    ALLERGIES: Allergies  Allergen Reactions   Ciprofloxacin In D5w Rash   Penicillins     REACTION: rash   Tricor [Fenofibrate] Rash    FAMILY HISTORY: Family History  Problem Relation Age of Onset   Migraines Mother    Hypertension Father    COPD Father    Diabetes Father    Prostate  cancer Maternal Uncle        prostate cancer   Lung cancer Maternal Grandmother    Stroke Maternal Grandfather    Heart attack Maternal Grandfather    Diabetes Other    Hypertension Other    Asthma Daughter    Colon cancer Neg Hx    Esophageal cancer Neg Hx    Rectal cancer Neg Hx    Stomach cancer Neg Hx       Objective:  Blood pressure 126/79, pulse 85, resp. rate 18, height '5\' 1"'  (1.549 m), weight 190 lb (86.2 kg), SpO2 98 %. General: No acute distress.  Patient appears well-groomed.   Head:  Normocephalic/atraumatic Eyes:  Fundi examined but not visualized Back:  left lower lumbar tenderness Neurological Exam: alert and oriented to person, place, and time.  Speech fluent and not dysarthric, language intact.  CN II-XII intact.  Bulk and tone normal, muscle strength 5-/5 bilateral deltoids, right grip and right lower extremity, otherwise 5/5 throughout.  Slight postural tremor in right hand.  Sensation to pinprick and vibration intact.  Deep tendon reflexes 2+ throughout, toes downgoing.  Finger to nose testing intact.  Gait slightly broad-based and cautious but steady.  Romberg negative.  Negative straight leg raise.   Metta Clines, DO  CC: Cathlean Cower, MD

## 2022-01-07 NOTE — Addendum Note (Signed)
Addended by: Vanessa Barbara D on: 01/07/2022 02:59 PM   Modules accepted: Orders

## 2022-01-07 NOTE — Patient Instructions (Addendum)
You had the 3 shots today  - depoprovera, B12, and Flu  Please continue all other medications as before, and refills have been done if requested.  Please have the pharmacy call with any other refills you may need.  Please continue your efforts at being more active, low cholesterol diet, and weight control.  You are otherwise up to date with prevention measures today.  Please keep your appointments with your specialists as you may have planned - neurology  You will be contacted regarding the referral for: GYN   Please go to the LAB at the blood drawing area for the tests to be done  You will be contacted by phone if any changes need to be made immediately.  Otherwise, you will receive a letter about your results with an explanation, but please check with MyChart first.  Please remember to sign up for MyChart if you have not done so, as this will be important to you in the future with finding out test results, communicating by private email, and scheduling acute appointments online when needed.  Please make an Appointment to return in 6 months, or sooner if needed

## 2022-01-07 NOTE — Patient Instructions (Signed)
Check vit d level Refer to physical therapy for left sciatica Follow up 6 months.

## 2022-01-07 NOTE — Progress Notes (Unsigned)
Patient ID: Courtney [name], female   DOB: 05-10-1975, 46 y.o.   MRN: [id number]        Chief Complaint: follow up HTN, HLD and hyperglycemia , low B12       HPI:  Courtney [name] is a 46 y.o. female here overall doing ok, due for B12 shot today and monthly, routine depoprovera, and flu shots.  Due for GYN and needs referral.  Pt denies chest pain, increased sob or doe, wheezing, orthopnea, PND, increased LE swelling, palpitations, dizziness or syncope.   Pt denies polydipsia, polyuria, or new focal neuro s/s.    Pt denies fever, night sweats, loss of appetite, or other constitutional symptoms   Lost  significant wt with better diet and ozempic  Wt Readings from Last 3 Encounters:  01/07/22 190 lb (86.2 kg)  01/07/22 190 lb (86.2 kg)  12/09/21 192 lb 12.8 oz (87.5 kg)   BP Readings from Last 3 Encounters:  01/07/22 126/79  01/07/22 120/85  12/09/21 130/82         Past Medical History:  Diagnosis Date   ALLERGIC RHINITIS 03/13/2007   Allergy    Anemia, B12 deficiency    Anxiety    C. difficile colitis    CARPAL TUNNEL SYNDROME, BILATERAL 01/13/2010   Cervical lesion 07/13/2011   Spine lesion - indeterminate, for f/u MRI may 2013 per neurology   Chronic fatigue    CTS (carpal tunnel syndrome)    Diabetes mellitus without complication ([place])    ESSENTIAL HYPERTENSION 03/13/2007   Fibromyalgia    GERD (gastroesophageal reflux disease)    Heart murmur    Hx of adenomatous polyp of colon 06/09/2009   Hyperlipidemia    HYPERLIPIDEMIA 03/13/2007   Irritable bowel syndrome 05/24/2010   Multiple sclerosis ([place])    OBSTRUCTIVE SLEEP APNEA 12/08/2008   Sleep apnea    uses c pap   VITAMIN D DEFICIENCY 01/13/2010   Past Surgical History:  Procedure Laterality Date   APPENDECTOMY  07/23/12   CARPAL TUNNEL RELEASE  01/2010   bilateral   COLONOSCOPY W/ BIOPSIES AND POLYPECTOMY  05/2009   2 tubular adenomas   LAPAROSCOPIC APPENDECTOMY N/A 07/23/2012   Procedure: APPENDECTOMY LAPAROSCOPIC;  Surgeon:  Gayland Curry, MD;  Location: [facility];  Service: General;  Laterality: N/A;    reports that she has never smoked. She has never used smokeless tobacco. She reports that she does not drink alcohol and does not use drugs. family history includes Asthma in her daughter; COPD in her father; Diabetes in her father and another family member; Heart attack in her maternal grandfather; Hypertension in her father and another family member; Lung cancer in her maternal grandmother; Migraines in her mother; Prostate cancer in her maternal uncle; Stroke in her maternal grandfather. Allergies  Allergen Reactions   Ciprofloxacin In D5w Rash   Penicillins     REACTION: rash   Tricor [Fenofibrate] Rash   Current Outpatient Medications on File Prior to Visit  Medication Sig Dispense Refill   amLODipine (NORVASC) 10 MG tablet Take 1 tablet (10 mg total) by mouth daily. 90 tablet 3   atorvastatin (LIPITOR) 40 MG tablet TAKE 1 TABLET BY MOUTH  DAILY 100 tablet 2   AUBAGIO 14 MG TABS TAKE 1 TABLET BY MOUTH  DAILY 30 tablet 5   baclofen (LIORESAL) 10 MG tablet TAKE 1 TABLET BY MOUTH 3 TIMES  DAILY 90 tablet 0   cetirizine (ZYRTEC) 10 MG tablet Take 10 mg by mouth daily.  Cholecalciferol (VITAMIN D) 2000 UNITS CAPS Take 2 capsules by mouth every morning. Reported on 06/05/2015     Continuous Blood Gluc Sensor (FREESTYLE LIBRE 2 SENSOR) MISC 1 Device by Does not apply route every 14 (fourteen) days. 6 each 3   cyanocobalamin (,VITAMIN B-12,) 1000 MCG/ML injection Inject into the muscle every 30 (thirty) days.     famotidine (PEPCID) 20 MG tablet TAKE 1 TABLET BY MOUTH  TWICE DAILY 180 tablet 3   furosemide (LASIX) 40 MG tablet TAKE 1 TABLET BY MOUTH DAILY 90 tablet 3   gabapentin (NEURONTIN) 300 MG capsule TAKE 2 CAPSULES BY MOUTH IN THE  MORNING 1 CAPSULE BY MOUTH IN  THE AFTERNOON AND 2 CAPSULES BY  MOUTH IN THE EVENING 450 capsule 3   glucose blood (FREESTYLE TEST STRIPS) test strip 1 each by Other route 2 (two)  times daily. And lancets, 2 times daily. 180 each 3   glucose monitoring kit (FREESTYLE) monitoring kit 1 each by Does not apply route as needed for other. Dispense any model that is covered- dispense testing supplies for Q AC/ HS accuchecks- 1 month supply with one refil. 1 each 1   insulin glargine (LANTUS SOLOSTAR) 100 UNIT/ML Solostar Pen Inject 28 Units into the skin at bedtime. 30 mL PRN   insulin lispro (HUMALOG KWIKPEN) 100 UNIT/ML KwikPen 3 times a day (just before each meal), 23-18-12 and pen needles 4/day. 60 mL 3   Insulin Pen Needle (B-D UF III MINI PEN NEEDLES) 31G X 5 MM MISC USE TO INJECT HUMALOG  KWIKPEN INTO THE SKIN 3  TIMES DAILY 270 each 2   loperamide (IMODIUM) 2 MG capsule Take 2 mg by mouth as needed for diarrhea or loose stools.     losartan (COZAAR) 100 MG tablet TAKE 1 TABLET BY MOUTH  DAILY 90 tablet 3   medroxyPROGESTERone (DEPO-PROVERA) 150 MG/ML injection Inject 150 mg into the muscle every 3 (three) months.       meloxicam (MOBIC) 15 MG tablet Take 1 tablet (15 mg total) by mouth daily. 90 tablet 3   metFORMIN (GLUCOPHAGE-XR) 500 MG 24 hr tablet TAKE 2 TABLETS BY MOUTH  DAILY 180 tablet 3   metoprolol tartrate (LOPRESSOR) 100 MG tablet TAKE 2 TABLETS BY MOUTH  DAILY 180 tablet 3   nortriptyline (PAMELOR) 50 MG capsule TAKE 2 CAPSULES BY MOUTH AT BEDTIME 180 capsule 3   omeprazole-sodium bicarbonate (ZEGERID) 40-1100 MG per capsule Take 1 capsule by mouth daily.       potassium chloride (KLOR-CON) 10 MEQ tablet TAKE 2 TABLETS BY MOUTH  DAILY 180 tablet 3   Semaglutide,0.25 or 0.5MG/DOS, (OZEMPIC, 0.25 OR 0.5 MG/DOSE,) 2 MG/1.5ML SOPN Inject 0.5 mg into the skin once a week. 4.5 mL 3   traMADol (ULTRAM) 50 MG tablet Take 1 tablet (50 mg total) by mouth every 6 (six) hours as needed. 20 tablet 1   No current facility-administered medications on file prior to visit.        ROS:  All others reviewed and negative.  Objective        PE:  BP 120/85   Pulse 93   Temp  99.1 F (37.3 C)   Ht '5\' 1"'  (1.549 m)   Wt 190 lb (86.2 kg)   SpO2 98%   BMI 35.90 kg/m                 Constitutional: Pt appears in NAD  HENT: Head: NCAT.                Right Ear: External ear normal.                 Left Ear: External ear normal.                Eyes: . Pupils are equal, round, and reactive to light. Conjunctivae and EOM are normal               Nose: without d/c or deformity               Neck: Neck supple. Gross normal ROM               Cardiovascular: Normal rate and regular rhythm.                 Pulmonary/Chest: Effort normal and breath sounds without rales or wheezing.                Abd:  Soft, NT, ND, + BS, no organomegaly               Neurological: Pt is alert. At baseline orientation, motor grossly intact               Skin: Skin is warm. No rashes, no other new lesions, LE edema - none               Psychiatric: Pt behavior is normal without agitation   Micro: none  Cardiac tracings I have personally interpreted today:  none  Pertinent Radiological findings (summarize): none   Lab Results  Component Value Date   WBC 8.4 01/07/2022   HGB 12.0 01/07/2022   HCT 36.1 01/07/2022   PLT 288.0 01/07/2022   GLUCOSE 133 (H) 01/07/2022   CHOL 106 01/07/2022   TRIG 182.0 (H) 01/07/2022   HDL 33.20 (L) 01/07/2022   LDLDIRECT 53.0 06/24/2021   LDLCALC 37 01/07/2022   ALT 15 01/07/2022   AST 10 01/07/2022   NA 138 01/07/2022   K 3.9 01/07/2022   CL 104 01/07/2022   CREATININE 1.06 01/07/2022   BUN 14 01/07/2022   CO2 25 01/07/2022   TSH 1.49 01/07/2022   INR 1.0 12/23/2010   HGBA1C 6.6 (A) 12/09/2021   MICROALBUR 1.8 01/07/2022   Assessment/Plan:  Courtney [name] is a 46 y.o. White or Caucasian [1] female with  has a past medical history of ALLERGIC RHINITIS (03/13/2007), Allergy, Anemia, B12 deficiency, Anxiety, C. difficile colitis, CARPAL TUNNEL SYNDROME, BILATERAL (01/13/2010), Cervical lesion (07/13/2011), Chronic fatigue, CTS  (carpal tunnel syndrome), Diabetes mellitus without complication ([place]), ESSENTIAL HYPERTENSION (03/13/2007), Fibromyalgia, GERD (gastroesophageal reflux disease), Heart murmur, adenomatous polyp of colon (06/09/2009), Hyperlipidemia, HYPERLIPIDEMIA (03/13/2007), Irritable bowel syndrome (05/24/2010), Multiple sclerosis ([place]), OBSTRUCTIVE SLEEP APNEA (12/08/2008), Sleep apnea, and VITAMIN D DEFICIENCY (01/13/2010).  Essential hypertension BP Readings from Last 3 Encounters:  01/07/22 126/79  01/07/22 120/85  12/09/21 130/82   Stable, pt to continue medical treatment norvasc 10 qd, losartan 100 mg qd, lopressor 100 - 2 qd    Hyperlipidemia Lab Results  Component Value Date   LDLCALC 37 01/07/2022   Stable, pt to continue current statin lipitor 40 qd   Type 2 diabetes mellitus with hyperglycemia, with long-term current use of insulin (HCC) Lab Results  Component Value Date   HGBA1C 6.6 (A) 12/09/2021   Stable, pt to continue current medical treatment lantus and humalog asd, metformin ER 2 qd, ozempic 0.5 mg  weekly    Vitamin B 12 deficiency Lab Results  Component Value Date   [id number] 412 06/24/2021   Stable, cont oral replacement - b12 1000 mcg qd   Vitamin D deficiency Last vitamin D Lab Results  Component Value Date   VD25OH 98 01/07/2022   Stable, cont oral replacement  Followup: No follow-ups on file.  Courtney Cower, MD 01/09/2022 2:42 PM [facility]

## 2022-01-08 LAB — VITAMIN D 25 HYDROXY (VIT D DEFICIENCY, FRACTURES): Vit D, 25-Hydroxy: 98 ng/mL (ref 30–100)

## 2022-01-09 NOTE — Assessment & Plan Note (Signed)
Last vitamin D Lab Results  Component Value Date   VD25OH 98 01/07/2022   Stable, cont oral replacement

## 2022-01-09 NOTE — Assessment & Plan Note (Signed)
BP Readings from Last 3 Encounters:  01/07/22 126/79  01/07/22 120/85  12/09/21 130/82   Stable, pt to continue medical treatment norvasc 10 qd, losartan 100 mg qd, lopressor 100 - 2 qd

## 2022-01-09 NOTE — Assessment & Plan Note (Signed)
Lab Results  Component Value Date   [id number] 06/24/2021   Stable, cont oral replacement - b12 1000 mcg qd

## 2022-01-09 NOTE — Assessment & Plan Note (Signed)
Lab Results  Component Value Date   LDLCALC 37 01/07/2022   Stable, pt to continue current statin lipitor 40 qd

## 2022-01-09 NOTE — Assessment & Plan Note (Signed)
Lab Results  Component Value Date   HGBA1C 6.6 (A) 12/09/2021   Stable, pt to continue current medical treatment lantus and humalog asd, metformin ER 2 qd, ozempic 0.5 mg weekly

## 2022-01-10 LAB — HEPATITIS C ANTIBODY: Hepatitis C Ab: NONREACTIVE

## 2022-01-14 DIAGNOSIS — E119 Type 2 diabetes mellitus without complications: Secondary | ICD-10-CM | POA: Diagnosis not present

## 2022-01-14 DIAGNOSIS — Z79899 Other long term (current) drug therapy: Secondary | ICD-10-CM | POA: Diagnosis not present

## 2022-01-14 DIAGNOSIS — H52223 Regular astigmatism, bilateral: Secondary | ICD-10-CM | POA: Diagnosis not present

## 2022-01-14 LAB — HM DIABETES EYE EXAM

## 2022-01-15 IMAGING — MR MR CERVICAL SPINE WO/W CM
6 of 8 series · 32 of 48 positions shown · IV contrast (gadavist)
Comparison: Prior MRI from 02/19/2018.

CLINICAL DATA: Follow-up examination for multiple sclerosis.

EXAM:
MRI HEAD WITHOUT AND WITH CONTRAST
MRI CERVICAL SPINE WITHOUT AND WITH CONTRAST
TECHNIQUE: Multiplanar, multiecho pulse sequences of the brain and surrounding
structures, and cervical spine, to include the craniocervical
junction and cervicothoracic junction, were obtained without and
with intravenous contrast.
CONTRAST:  10mL GADAVIST GADOBUTROL 1 MMOL/ML IV SOLN

[Series 30: T1 · sagittal · 3.0mm · 0.59mm/px · 4 of 17 slices shown (1 of 2)]
[im 1/17]
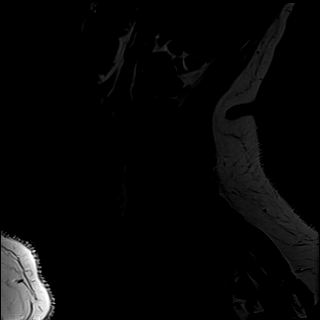
[im 6/17]
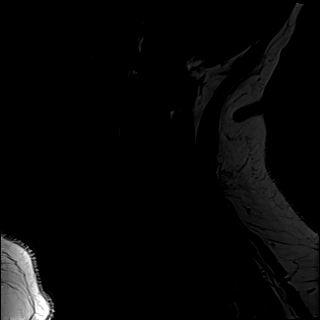
[im 11/17]
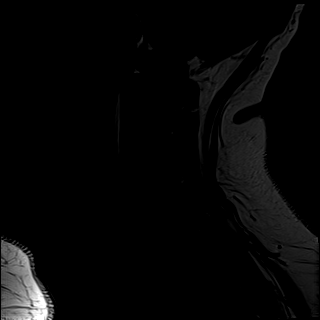
[im 17/17]
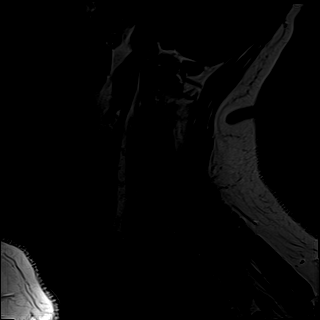

[Series 31: STIR · sagittal · 3.0mm · 0.74mm/px · 4 of 17 slices shown]
[im 1/17]
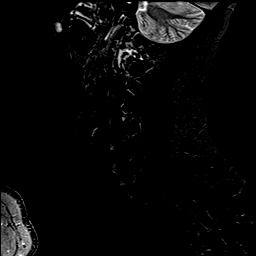
[im 6/17]
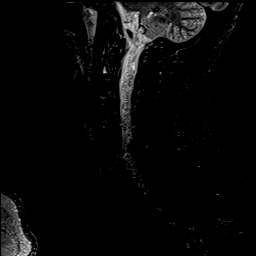
[im 11/17]
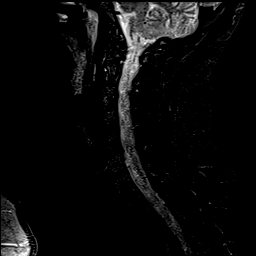
[im 17/17]
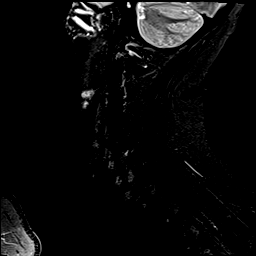

[Series 32: T2 · axial · 3.0mm · 0.70mm/px · z∈[-214,-118]mm · 8 of 29 slices shown]
[im 1/29]
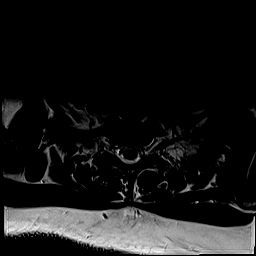
[im 5/29]
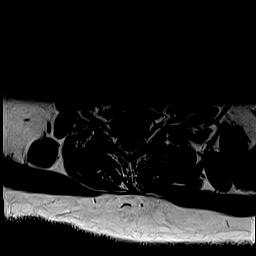
[im 9/29]
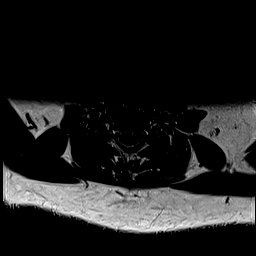
[im 13/29]
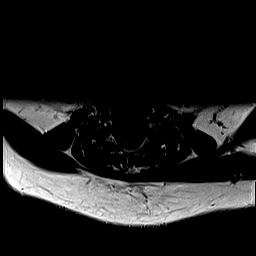
[im 17/29]
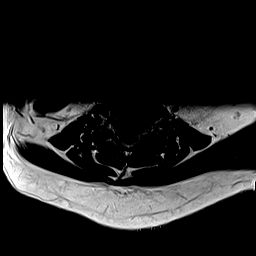
[im 21/29]
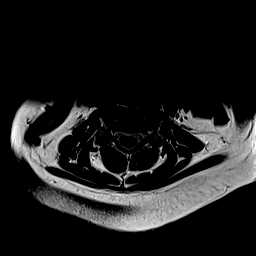
[im 25/29]
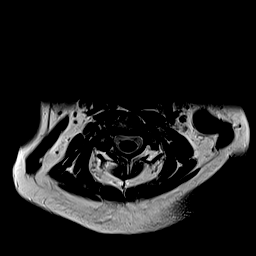
[im 29/29]
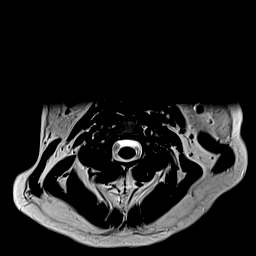

[Series 34: T1 · axial · 3.0mm · 0.35mm/px · z∈[-214,-118]mm · 8 of 29 slices shown (2 of 2)]
[im 1/29]
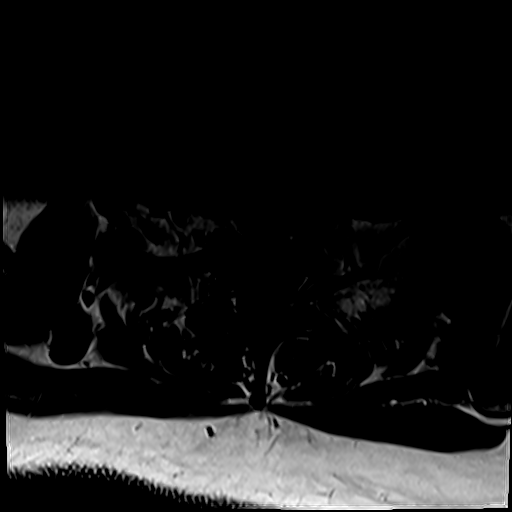
[im 5/29]
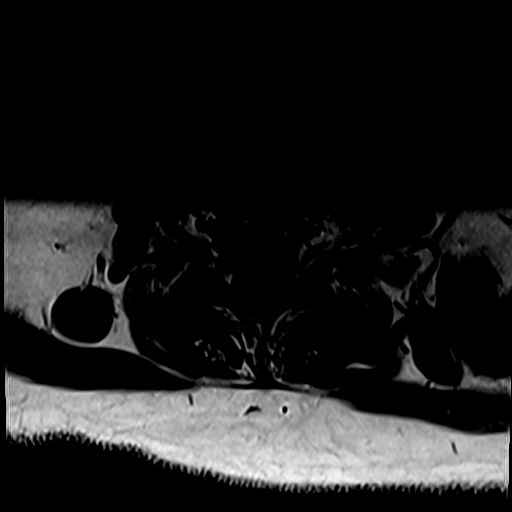
[im 9/29]
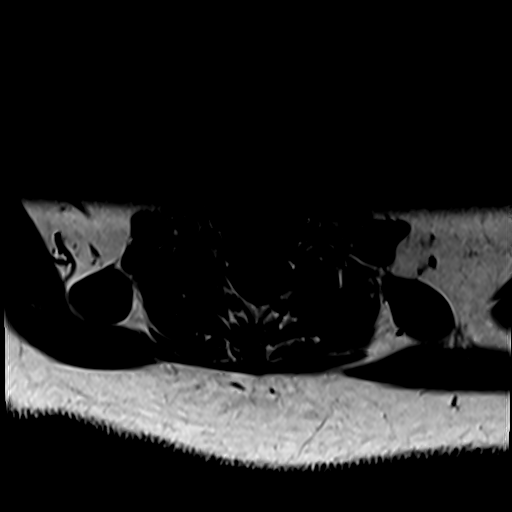
[im 13/29]
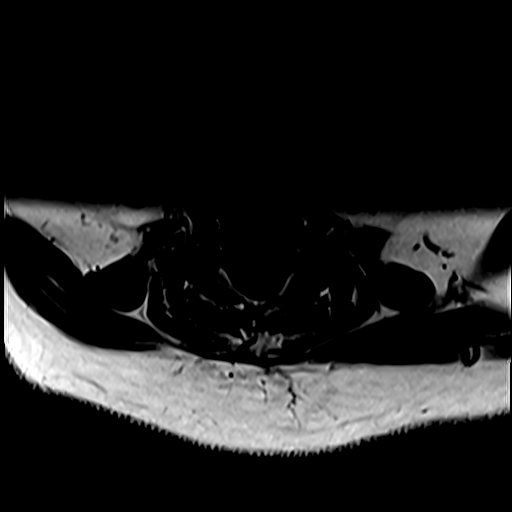
[im 17/29]
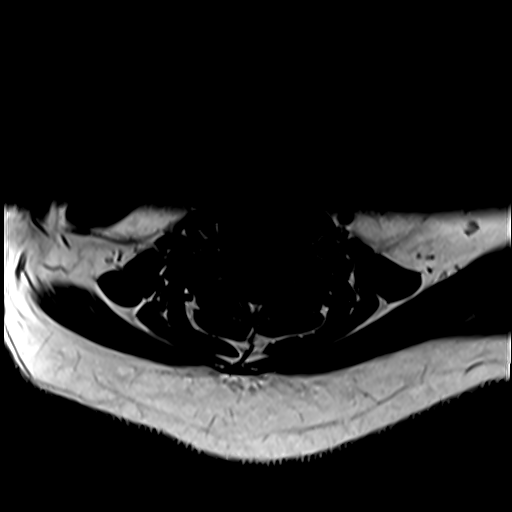
[im 21/29]
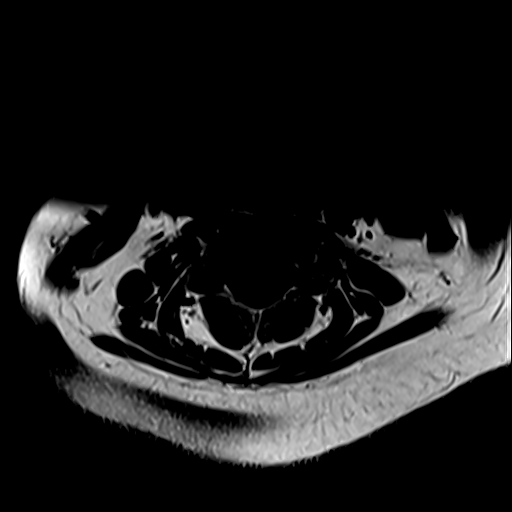
[im 25/29]
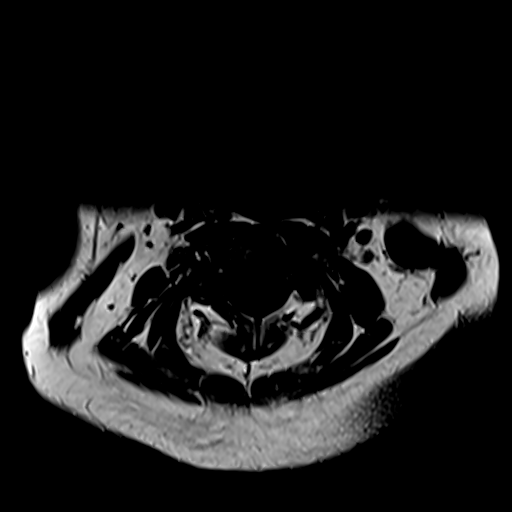
[im 29/29]
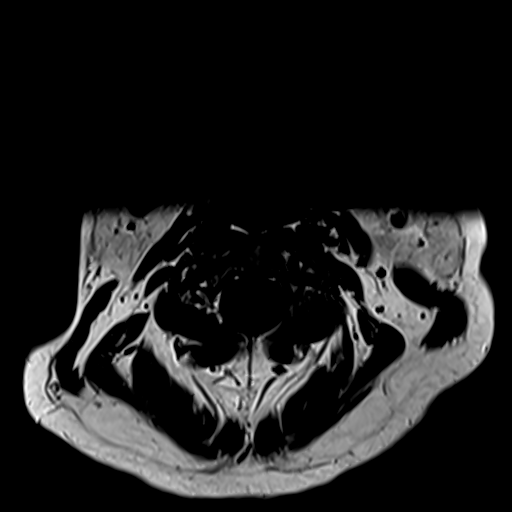

[Series 35: T2 post-contrast · sagittal · 3.0mm · 0.59mm/px · 4 of 17 slices shown]
[im 1/17]
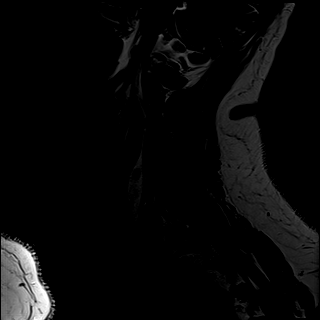
[im 6/17]
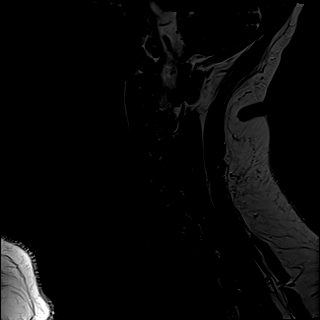
[im 11/17]
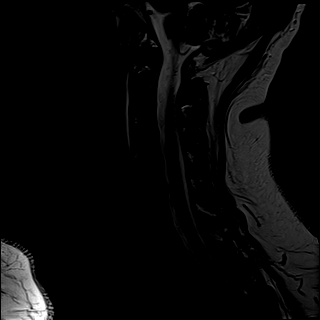
[im 17/17]
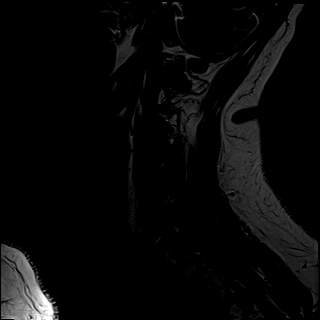

[Series 36: T1 fat-sat post-contrast · sagittal · 3.0mm · 0.59mm/px · 4 of 17 slices shown]
[im 1/17]
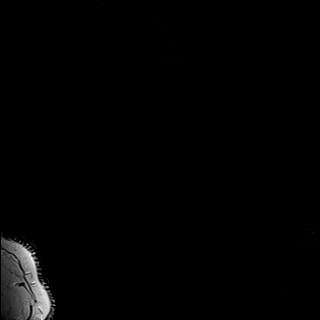
[im 6/17]
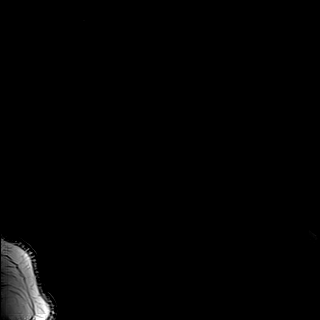
[im 11/17]
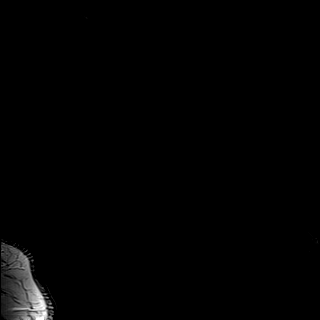
[im 17/17]
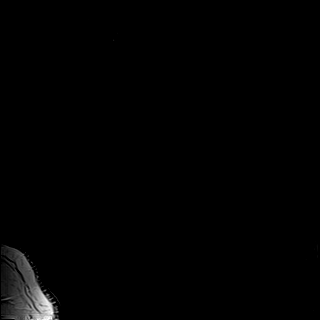

[32 of 48 positions shown; findings below may reference images not displayed]

FINDINGS: MRI HEAD FINDINGS

Brain: Cerebral volume within normal limits, and stable from
previous. Few scattered foci of T2/FLAIR hyperintensity again seen
involving the periventricular, deep, and subcortical white matter of
both cerebral hemispheres, right slightly greater than left,
consistent with history of multiple sclerosis. Most prominent lesion
measures 9 mm involving the right frontal periventricular white
matter. Relative sparing of the brainstem and cerebellum. In
comparison with previous exam and allowing for differences in
technique, overall appearance is not significantly changed. No
evidence for active demyelination.

No evidence for acute or subacute infarct. Gray-white matter
differentiation maintained without evidence for interval cortical
infarction. No evidence for acute or chronic intracranial
hemorrhage. No mass lesion, midline shift or mass effect. No
hydrocephalus or extra-axial fluid collection. Pituitary gland and
suprasellar region within normal limits. No abnormal enhancement.

Vascular: Major intracranial vascular flow voids are maintained.

Skull and upper cervical spine: Craniocervical junction normal. Bone
marrow signal intensity within normal limits. No scalp soft tissue
abnormality.

Sinuses/Orbits: Globes and orbital soft tissues within normal
limits. Paranasal sinuses are largely clear. Small bilateral mastoid
effusions noted, of doubtful significance. Inner ear structures
grossly normal.

Other: None.

MRI CERVICAL SPINE FINDINGS

Alignment: Mild straightening of the normal cervical lordosis
without listhesis or subluxation.

Vertebrae: Vertebral body height maintained without evidence for
acute or interval fracture. Bone marrow signal intensity normal. No
discrete or worrisome osseous lesions. No abnormal marrow edema or
enhancement.

Cord: Cord signal abnormality involving the central and right aspect
of the cord at the level of C4, stable. Additional subtle patchy
cord signal abnormality involving the dorsal cord at the level of
C6-7, also unchanged. No other definite cord signal abnormality
identified. No abnormal enhancement to suggest active demyelination.
In comparison with previous exam, overall appearance is unchanged.

Posterior Fossa, vertebral arteries, paraspinal tissues:
Craniocervical junction normal. Paraspinous and prevertebral soft
tissues within normal limits. Normal flow voids seen within the
vertebral arteries bilaterally.

Disc levels:

C2-C3: Unremarkable.

C3-C4:  Unremarkable.

C4-C5:  Unremarkable.

C5-C6: Mild diffuse disc bulge. No significant canal or foraminal
stenosis. Appearance is stable.

C6-C7:  Unremarkable.

C7-T1:  Unremarkable.

Visualized upper thoracic spine demonstrates no significant finding.
IMPRESSION: MRI HEAD IMPRESSION:

1. Scattered bifrontal cerebral white matter changes as above,
consistent with history of multiple sclerosis. Overall, appearance
is not significantly changed or progressed as compared to 7459. No
evidence for active demyelination.
2. Otherwise stable and normal brain MRI.

MRI CERVICAL SPINE IMPRESSION:

1. Patchy cord signal abnormality at the level of C4 and C6-7,
consistent with demyelinating disease. Overall, appearance is not
significantly changed or progressed as compared to 7459. No evidence
for active demyelination.
2. Mild noncompressive disc bulging at C5-6 without stenosis,
stable.

## 2022-01-16 ENCOUNTER — Other Ambulatory Visit: Payer: Self-pay | Admitting: Neurology

## 2022-01-23 ENCOUNTER — Other Ambulatory Visit: Payer: Self-pay | Admitting: Internal Medicine

## 2022-01-23 ENCOUNTER — Other Ambulatory Visit: Payer: Self-pay | Admitting: Podiatry

## 2022-01-24 NOTE — Telephone Encounter (Signed)
Please refill as per office routine med refill policy (all routine meds to be refilled for 3 mo or monthly (per pt preference) up to one year from last visit, then month to month grace period for 3 mo, then further med refills will have to be denied) ? ?

## 2022-01-26 ENCOUNTER — Other Ambulatory Visit: Payer: Self-pay | Admitting: Internal Medicine

## 2022-01-26 NOTE — Telephone Encounter (Signed)
Please refill as per office routine med refill policy (all routine meds to be refilled for 3 mo or monthly (per pt preference) up to one year from last visit, then month to month grace period for 3 mo, then further med refills will have to be denied) ? ?

## 2022-01-30 ENCOUNTER — Other Ambulatory Visit: Payer: Self-pay | Admitting: Neurology

## 2022-02-01 ENCOUNTER — Other Ambulatory Visit: Payer: Self-pay

## 2022-02-01 DIAGNOSIS — Z794 Long term (current) use of insulin: Secondary | ICD-10-CM

## 2022-02-01 MED ORDER — BD PEN NEEDLE MINI U/F 31G X 5 MM MISC: 2 refills | Status: DC

## 2022-02-11 ENCOUNTER — Ambulatory Visit: Payer: Medicare Other

## 2022-03-25 ENCOUNTER — Telehealth: Payer: Self-pay

## 2022-03-25 NOTE — Telephone Encounter (Signed)
Inbound fax from DME supplier requesting form be completed and faxed with clinical notes. DME supplies ordered via [employer] through online portal.  

## 2022-03-28 DIAGNOSIS — Z794 Long term (current) use of insulin: Secondary | ICD-10-CM | POA: Diagnosis not present

## 2022-03-28 DIAGNOSIS — E119 Type 2 diabetes mellitus without complications: Secondary | ICD-10-CM | POA: Diagnosis not present

## 2022-04-04 ENCOUNTER — Other Ambulatory Visit: Payer: Self-pay

## 2022-04-04 ENCOUNTER — Other Ambulatory Visit: Payer: Self-pay | Admitting: Neurology

## 2022-04-04 MED ORDER — NORTRIPTYLINE HCL 50 MG PO CAPS: 100.0000 mg | ORAL_CAPSULE | Freq: Every day | ORAL | 0 refills | Status: DC

## 2022-04-09 ENCOUNTER — Other Ambulatory Visit: Payer: Self-pay | Admitting: Internal Medicine

## 2022-04-09 NOTE — Telephone Encounter (Signed)
Please refill as per office routine med refill policy (all routine meds to be refilled for 3 mo or monthly (per pt preference) up to one year from last visit, then month to month grace period for 3 mo, then further med refills will have to be denied) ? ?

## 2022-04-15 ENCOUNTER — Encounter: Payer: Self-pay | Admitting: Internal Medicine

## 2022-04-15 ENCOUNTER — Ambulatory Visit: Payer: Medicare Other | Admitting: Internal Medicine

## 2022-04-15 VITALS — BP 120/80 | HR 79 | Ht 61.0 in | Wt 196.6 lb

## 2022-04-15 DIAGNOSIS — E1165 Type 2 diabetes mellitus with hyperglycemia: Secondary | ICD-10-CM | POA: Diagnosis not present

## 2022-04-15 DIAGNOSIS — E7800 Pure hypercholesterolemia, unspecified: Secondary | ICD-10-CM | POA: Diagnosis not present

## 2022-04-15 DIAGNOSIS — Z794 Long term (current) use of insulin: Secondary | ICD-10-CM | POA: Diagnosis not present

## 2022-04-15 LAB — POCT GLYCOSYLATED HEMOGLOBIN (HGB A1C): Hemoglobin A1C: 6.9 % — AB (ref 4.0–5.6)

## 2022-04-15 MED ORDER — SEMAGLUTIDE (1 MG/DOSE) 4 MG/3ML ~~LOC~~ SOPN: 1.0000 mg | PEN_INJECTOR | SUBCUTANEOUS | 3 refills | Status: DC

## 2022-04-15 MED ORDER — LANTUS SOLOSTAR 100 UNIT/ML ~~LOC~~ SOPN: 28.0000 [IU] | PEN_INJECTOR | Freq: Every day | SUBCUTANEOUS | 3 refills | Status: DC

## 2022-04-15 MED ORDER — INSULIN LISPRO (1 UNIT DIAL) 100 UNIT/ML (KWIKPEN): PEN_INJECTOR | SUBCUTANEOUS | 3 refills | Status: DC

## 2022-04-15 NOTE — Patient Instructions (Addendum)
Please continue: - Lantus 28 units at bedtime  Please change Humalog - 15 min before meals: - before coffee: 20 units - before lunch: 16-20 units - before dinner: 14-18 units   Try to increase: - Ozempic 1 mg weekly  Please return in 4 months.

## 2022-04-15 NOTE — Progress Notes (Signed)
Patient ID: Courtney [name], female   DOB: Apr 02, 1976, 46 y.o.   MRN: [id number]  HPI: Courtney [name] is a 46 y.o.-year-old female, returning for follow-up for DM2, dx in 2016 (when she presented in hyperglycemic nonketotic hyperosmolar state), insulin-dependent since 2018, uncontrolled, without long-term complications. Pt. previously saw Dr. Loanne Drilling, but last visit with me 4 months ago.  Interim history: Before last visit, her daughter just had a baby (her first grandchild) and she was living at her house.  She had more irregular meals.  Sugars were a little higher. No increased urination, blurry vision, nausea, chest pain.  Reviewed HbA1c: Lab Results  Component Value Date   HGBA1C 6.6 (A) 12/09/2021   HGBA1C 7.2 (A) 08/23/2021   HGBA1C 8.2 (H) 06/24/2021   HGBA1C 8.4 (A) 05/07/2021   HGBA1C 8.7 (A) 03/05/2021   HGBA1C 8.0 (A) 01/04/2021   HGBA1C 8.1 (H) 12/24/2020   HGBA1C 7.4 (A) 05/13/2020   HGBA1C 8.0 (A) 03/05/2020   HGBA1C 8.3 (A) 01/03/2020   Pt is on a regimen of: - Rybelsus 14 mg at waking up >> Ozempic 0.5 mg weekly -tolerated well - Lantus 28 units at bedtime - Lispro 23-18-12 units before meals >> 15 min before meals She tried metformin but this caused nausea. She tried Invokana but this caused vaginitis. Per review of Dr. Cordelia Pen note, she declined weight loss surgery.   Also, per his note, she declined an insulin pump.  Pt checks her sugars >4x a day:  Previously:   Lowest sugar was 65 >> 100; she has hypoglycemia awareness at 70.  Highest sugar was 233 >> 240.  - no CKD, last BUN/creatinine:  Lab Results  Component Value Date   BUN 14 01/07/2022   BUN 11 06/24/2021   CREATININE 1.06 01/07/2022   CREATININE 0.88 06/24/2021  On Cozaar 100 mg daily.  -+ HL; last set of lipids: Lab Results  Component Value Date   CHOL 106 01/07/2022   HDL 33.20 (L) 01/07/2022   LDLCALC 37 01/07/2022   LDLDIRECT 53.0 06/24/2021   TRIG 182.0 (H) 01/07/2022   CHOLHDL  3 01/07/2022  On atorvastatin 40 mg daily.  - last eye exam was in 02/2022. No DR reportedly.   - no numbness and tingling in her feet.  Last foot exam 01/04/2021.  She is on gabapentin 300 mg tablets: 2-1-2.  Last foot exam 01/07/2022.  She also has a history of HTN, MS, fibromyalgia, chronic fatigue, OSA-on CPAP, GERD, IBS, vitamin D deficiency, B12 anemia.  ROS: + see HPI  Past Medical History:  Diagnosis Date   ALLERGIC RHINITIS 03/13/2007   Allergy    Anemia, B12 deficiency    Anxiety    C. difficile colitis    CARPAL TUNNEL SYNDROME, BILATERAL 01/13/2010   Cervical lesion 07/13/2011   Spine lesion - indeterminate, for f/u MRI may 2013 per neurology   Chronic fatigue    CTS (carpal tunnel syndrome)    Diabetes mellitus without complication ([place])    ESSENTIAL HYPERTENSION 03/13/2007   Fibromyalgia    GERD (gastroesophageal reflux disease)    Heart murmur    Hx of adenomatous polyp of colon 06/09/2009   Hyperlipidemia    HYPERLIPIDEMIA 03/13/2007   Irritable bowel syndrome 05/24/2010   Multiple sclerosis ([place])    OBSTRUCTIVE SLEEP APNEA 12/08/2008   Sleep apnea    uses c pap   VITAMIN D DEFICIENCY 01/13/2010   Past Surgical History:  Procedure Laterality Date   APPENDECTOMY  07/23/12  CARPAL TUNNEL RELEASE  01/2010   bilateral   COLONOSCOPY W/ BIOPSIES AND POLYPECTOMY  05/2009   2 tubular adenomas   LAPAROSCOPIC APPENDECTOMY N/A 07/23/2012   Procedure: APPENDECTOMY LAPAROSCOPIC;  Surgeon: Gayland Curry, MD;  Location: [facility];  Service: General;  Laterality: N/A;   Social History   Socioeconomic History   Marital status: Single    Spouse name: Not on file   Number of children: 2   Years of education: Not on file   Highest education level: Not on file  Occupational History   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    Employer: UNEMPLOYED  Tobacco Use   Smoking status: Never   Smokeless tobacco: Never  Vaping Use   Vaping Use: Never used  Substance and Sexual Activity    Alcohol use: No    Alcohol/week: 0.0 standard drinks of alcohol   Drug use: No   Sexual activity: Not on file  Other Topics Concern   Not on file  Social History Narrative   Daily Caffeine   Right handed   Two story home   Social Determinants of Health   Financial Resource Strain: Not on file  Food Insecurity: Not on file  Transportation Needs: Not on file  Physical Activity: Not on file  Stress: Not on file  Social Connections: Not on file  Intimate Partner Violence: Not on file   Current Outpatient Medications on File Prior to Visit  Medication Sig Dispense Refill   amLODipine (NORVASC) 10 MG tablet TAKE 1 TABLET BY MOUTH DAILY 100 tablet 2   atorvastatin (LIPITOR) 40 MG tablet TAKE 1 TABLET BY MOUTH DAILY 100 tablet 2   AUBAGIO 14 MG TABS TAKE 1 TABLET BY MOUTH  DAILY 30 tablet 5   baclofen (LIORESAL) 10 MG tablet TAKE 1 TABLET BY MOUTH 3 TIMES  DAILY 90 tablet 0   cetirizine (ZYRTEC) 10 MG tablet Take 10 mg by mouth daily.       Cholecalciferol (VITAMIN D) 2000 UNITS CAPS Take 2 capsules by mouth every morning. Reported on 06/05/2015     Continuous Blood Gluc Sensor (FREESTYLE LIBRE 2 SENSOR) MISC 1 Device by Does not apply route every 14 (fourteen) days. 6 each 3   cyanocobalamin (,VITAMIN B-12,) 1000 MCG/ML injection Inject into the muscle every 30 (thirty) days.     famotidine (PEPCID) 20 MG tablet TAKE 1 TABLET BY MOUTH TWICE  DAILY 200 tablet 2   furosemide (LASIX) 40 MG tablet TAKE 1 TABLET BY MOUTH DAILY 90 tablet 3   gabapentin (NEURONTIN) 300 MG capsule TAKE 2 CAPSULES BY MOUTH IN THE  MORNING 1 CAPSULE BY MOUTH IN  THE AFTERNOON AND 2 CAPSULES BY  MOUTH IN THE EVENING 450 capsule 3   glucose blood (FREESTYLE TEST STRIPS) test strip 1 each by Other route 2 (two) times daily. And lancets, 2 times daily. 180 each 3   glucose monitoring kit (FREESTYLE) monitoring kit 1 each by Does not apply route as needed for other. Dispense any model that is covered- dispense testing  supplies for Q AC/ HS accuchecks- 1 month supply with one refil. 1 each 1   insulin glargine (LANTUS SOLOSTAR) 100 UNIT/ML Solostar Pen Inject 28 Units into the skin at bedtime. 30 mL PRN   insulin lispro (HUMALOG KWIKPEN) 100 UNIT/ML KwikPen 3 times a day (just before each meal), 23-18-12 and pen needles 4/day. 60 mL 3   Insulin Pen Needle (B-D UF III MINI PEN NEEDLES) 31G X 5 MM MISC  USE TO INJECT HUMALOG  KWIKPEN INTO THE SKIN 3  TIMES DAILY 270 each 2   loperamide (IMODIUM) 2 MG capsule Take 2 mg by mouth as needed for diarrhea or loose stools.     losartan (COZAAR) 100 MG tablet TAKE 1 TABLET BY MOUTH DAILY 100 tablet 2   medroxyPROGESTERone (DEPO-PROVERA) 150 MG/ML injection Inject 150 mg into the muscle every 3 (three) months.       meloxicam (MOBIC) 15 MG tablet TAKE 1 TABLET BY MOUTH DAILY 100 tablet 2   metFORMIN (GLUCOPHAGE-XR) 500 MG 24 hr tablet TAKE 2 TABLETS BY MOUTH  DAILY 180 tablet 3   metoprolol tartrate (LOPRESSOR) 100 MG tablet TAKE 2 TABLETS BY MOUTH DAILY 200 tablet 2   nortriptyline (PAMELOR) 50 MG capsule Take 2 capsules (100 mg total) by mouth at bedtime. 180 capsule 0   omeprazole-sodium bicarbonate (ZEGERID) 40-1100 MG per capsule Take 1 capsule by mouth daily.       potassium chloride (KLOR-CON) 10 MEQ tablet TAKE 2 TABLETS BY MOUTH DAILY 200 tablet 2   Semaglutide,0.25 or 0.5MG/DOS, (OZEMPIC, 0.25 OR 0.5 MG/DOSE,) 2 MG/1.5ML SOPN Inject 0.5 mg into the skin once a week. 4.5 mL 3   traMADol (ULTRAM) 50 MG tablet Take 1 tablet (50 mg total) by mouth every 6 (six) hours as needed. 20 tablet 1   No current facility-administered medications on file prior to visit.   Allergies  Allergen Reactions   Ciprofloxacin In D5w Rash   Penicillins     REACTION: rash   Tricor [Fenofibrate] Rash   Family History  Problem Relation Age of Onset   Migraines Mother    Hypertension Father    COPD Father    Diabetes Father    Prostate cancer Maternal Uncle        prostate cancer    Lung cancer Maternal Grandmother    Stroke Maternal Grandfather    Heart attack Maternal Grandfather    Diabetes Other    Hypertension Other    Asthma Daughter    Colon cancer Neg Hx    Esophageal cancer Neg Hx    Rectal cancer Neg Hx    Stomach cancer Neg Hx    PE: BP 120/80 (BP Location: Left Arm, Patient Position: Sitting, Cuff Size: Normal)   Pulse 79   Ht _0  (1.549 m)   Wt 196 lb 9.6 oz (89.2 kg)   SpO2 99%   BMI 37.15 kg/m  Wt Readings from Last 3 Encounters:  04/15/22 196 lb 9.6 oz (89.2 kg)  01/07/22 190 lb (86.2 kg)  01/07/22 190 lb (86.2 kg)   Constitutional: overweight, in NAD Eyes: no exophthalmos ENT: no thyromegaly, no cervical lymphadenopathy Cardiovascular: RRR, No MRG, + B LE edema (chronic) Respiratory: CTA B Musculoskeletal: no deformities Skin: no rashes Neurological: + tremor with outstretched hands (chronic)  ASSESSMENT: 1. DM2, insulin-dependent, uncontrolled, without long-term complications  2. HL  PLAN:  1. Patient with longstanding, uncontrolled, type 2 diabetes, with improved control at last visit, when HbA1c returned 6.6%, decreased from 7.2%.  At that time, reviewing her CGM trends, sugars were mostly at goal, but they were high in the target range open and increasing around the time of waking up/drinking coffee and dropping after breakfast.  Sugars appears to be well-controlled afterwards.  We discussed about how to take lispro insulin correctly: Injecting 15 minutes before each meal and varying the dose depending on the meal.  I also advised her to switch from Rybelsus to  Ozempic, for stronger effect.  We started at a low dose and I advised her how to increase it. CGM interpretation: -At today's visit, we reviewed her CGM downloads: It appears that 79% of values are in target range (goal >70%), while 21% are higher than 180 (goal <25%), and 0% are lower than 70 (goal <4%).  The calculated average blood sugar is 161.  The projected HbA1c  for the next 3 months ([facility]) is 7.2%. -Reviewing the CGM trends, sugars appear to be fluctuating in the upper half of the target range, with spikes above target after coffee in the morning and also after dinner.  Upon questioning, she is putting creamer in her coffee and I advised her to try to eliminate this.  Also, she takes her Humalog bolus after coffee, before breakfast and sugars are dropping afterwards.  I advised her to move the bolus before coffee and adjust the dose down.  Also, advised her to take a slightly higher dose of insulin before dinner.  Otherwise, we can continue the same dose of Lantus.  Will also try to increase the dose of Ozempic, which she was able to start since last visit and she tolerates it well.  I advised her that she may need to decrease the doses of Humalog if she sees lower blood sugars after starting Ozempic. - I suggested to:  Patient Instructions  Please continue: - Lantus 28 units at bedtime  Please change Humalog - 15 min before meals: - before coffee: 20 units - before lunch: 16-20 units - before dinner: 14-18 units   Try to increase: - Ozempic 1 mg weekly  Please return in 4 months.  - we checked her HbA1c: 6.9% (slightly higher) - advised to check sugars at different times of the day - 4x a day, rotating check times - advised for yearly eye exams >> she is UTD - return to clinic in 4 months   2. HL -Reviewed latest lipid panel from 12/2021: LDL at goal, improved, HDL low, triglycerides high: Lab Results  Component Value Date   CHOL 106 01/07/2022   HDL 33.20 (L) 01/07/2022   LDLCALC 37 01/07/2022   LDLDIRECT 53.0 06/24/2021   TRIG 182.0 (H) 01/07/2022   CHOLHDL 3 01/07/2022  -Continues on Lipitor 40 mg daily without side effects.  Philemon Kingdom, MD PhD [facility]

## 2022-04-22 ENCOUNTER — Encounter: Payer: Self-pay | Admitting: Neurology

## 2022-04-22 MED ORDER — TERIFLUNOMIDE 14 MG PO TABS: ORAL_TABLET | ORAL | 3 refills | Status: DC

## 2022-04-29 ENCOUNTER — Ambulatory Visit (INDEPENDENT_AMBULATORY_CARE_PROVIDER_SITE_OTHER): Payer: Medicare Other | Admitting: *Deleted

## 2022-04-29 DIAGNOSIS — Z309 Encounter for contraceptive management, unspecified: Secondary | ICD-10-CM | POA: Diagnosis not present

## 2022-04-29 DIAGNOSIS — Z30013 Encounter for initial prescription of injectable contraceptive: Secondary | ICD-10-CM

## 2022-04-29 DIAGNOSIS — E538 Deficiency of other specified B group vitamins: Secondary | ICD-10-CM | POA: Diagnosis not present

## 2022-04-29 MED ORDER — MEDROXYPROGESTERONE ACETATE 150 MG/ML IM SUSP
150.0000 mg | Freq: Once | INTRAMUSCULAR | Status: AC
  Administered 2022-04-29: 150 mg via INTRAMUSCULAR

## 2022-04-29 MED ORDER — CYANOCOBALAMIN 1000 MCG/ML IJ SOLN
1000.0000 ug | Freq: Once | INTRAMUSCULAR | Status: AC
  Administered 2022-04-29: 1000 ug via INTRAMUSCULAR

## 2022-04-29 NOTE — Progress Notes (Signed)
Pls cosign for B12  & Depo Provera inj.Marland KitchenJohny Chess

## 2022-05-13 ENCOUNTER — Other Ambulatory Visit: Payer: Self-pay | Admitting: Internal Medicine

## 2022-05-13 ENCOUNTER — Encounter: Payer: Self-pay | Admitting: Internal Medicine

## 2022-05-13 MED ORDER — METFORMIN HCL ER 500 MG PO TB24: 1000.0000 mg | ORAL_TABLET | Freq: Every day | ORAL | 3 refills | Status: DC

## 2022-05-26 ENCOUNTER — Other Ambulatory Visit: Payer: Self-pay

## 2022-05-26 MED ORDER — METFORMIN HCL ER 500 MG PO TB24: 1000.0000 mg | ORAL_TABLET | Freq: Every day | ORAL | 3 refills | Status: DC

## 2022-06-03 ENCOUNTER — Ambulatory Visit (INDEPENDENT_AMBULATORY_CARE_PROVIDER_SITE_OTHER): Payer: Medicare Other

## 2022-06-03 DIAGNOSIS — E538 Deficiency of other specified B group vitamins: Secondary | ICD-10-CM | POA: Diagnosis not present

## 2022-06-03 MED ORDER — CYANOCOBALAMIN 1000 MCG/ML IJ SOLN
1000.0000 ug | Freq: Once | INTRAMUSCULAR | Status: AC
  Administered 2022-06-03: 1000 ug via INTRAMUSCULAR

## 2022-06-03 NOTE — Progress Notes (Signed)
Pt here for monthly B12 injection per Dr Jenny Reichmann  B12 1069mg given IM, and pt tolerated injection well.

## 2022-06-16 DIAGNOSIS — E119 Type 2 diabetes mellitus without complications: Secondary | ICD-10-CM | POA: Diagnosis not present

## 2022-06-16 DIAGNOSIS — Z794 Long term (current) use of insulin: Secondary | ICD-10-CM | POA: Diagnosis not present

## 2022-06-27 ENCOUNTER — Telehealth: Payer: Self-pay

## 2022-06-27 NOTE — Telephone Encounter (Signed)
Called patient to schedule Medicare Annual Wellness Visit (AWV). Left message for patient to call back and schedule Medicare Annual Wellness Visit (AWV).  Last date of AWV: eligible 12/18/10 for AWV-I  Please schedule an appointment at any time with Nurse Health Advisor.   Norton Blizzard, [place] ([facility]AMA)  [facility] [phone]

## 2022-06-28 NOTE — Telephone Encounter (Signed)
Patient returned call and said she is only available Fridays. She said her work schedule may be changing and that she would call back. Best callback is [phone].

## 2022-07-06 NOTE — Progress Notes (Deleted)
NEUROLOGY FOLLOW UP OFFICE NOTE  MARRI [name] [id number]  Assessment/Plan:   Multiple sclerosis     DMT:  Rogelia Rohrer *** Pain management:  gabapentin 600mg /300mg /600mg , baclofen, nortriptyline 100mg  at bedtime D3 10,000 IU daily ***      Subjective:  Courtney [name] is a 47 year old right-handed Caucasian woman who follows up for multiple sclerosis.   UPDATE: Current DMT:  Teriflunomide 14mg  daily Other current medications: baclofen 10 mg, gabapentin 600mg /300mg /600mg , D3 10000 IU daily, B12 1044mcg injection every 30 days, nortriptyline 100 mg  01/07/2022 LABS: Vit D 98.; hepatic panel with t bili 0.5, ALP 72, AST 10, ALT 15; CBC with WBC 8.4, HGB 12, HCT 36.1, PLT 288, ALC 1.4  Due to insurance coverage, brand name Rogelia Rohrer was switched to generic teriflunomide in January.  Vision:No issues Motor: notes residual right upper and lower extremity weakness.  Notes grip in both hands aren't as strong. Sensory: may get facial numbness with heat and stress.  Pain: Last visit, referred to physical therapy for left sided sciatica.  ***  Gait: balance okay but feels that she is walking a little slower. Bowel/Bladder:  No change Fatigue: No Cognition: Sometimes she has trouble remembering new information Mood: Stable     HISTORY: Onset of symptoms and diagnosis occurred in 2006.  She experienced an episode of numbness in her right arm and both lower extremities.  She was dropping objects.  MRI of brain reportedly showed "solitary right mid-to-posterior frontal periventricular white matter lesion".  MRI of spine revealed no cord demyelination.  CSF analysis from lumbar puncture reportedly revealed oligoclonal bands and an elevated IgG index.  She was diagnosed with clinically isolated syndrome but was officially diagnosed with multiple sclerosis a few months later when she developed a relapse with left hand numbness.  In February 2013, she developed left arm pain and swelling.  MRI of  cervical spine revealed an enhancing spinal cord lesion at C4 on the right.  She was treated with IV Solumedrol.  In 2014, she had an episode of optic neuritis in her right eye, treated with IV Solumedrol.  In September-October 2019, she reported to 4 weeks history of tingling in her face, which may occur off and on for 10 minutes 3 times a week.  She was prescribed a prednisone taper.  Compared to prior MRI from 02/07/2017, repeat MRI of the brain and cervical spine with and without contrast from 02/19/2018, which were personally reviewed, demonstrated mildly progressed bifrontal cerebral white matter changes as well as probable additional abnormal signal in the left cord at the C6-7 level but no abnormal enhancement to suggest active demyelinating disease. Questionable if paresthesias were secondary to B12 deficiency as well.   Other history:  History of migraines and ocular migraines   There is no family history of MS.   Past disease modifying therapy:  Avonex (difficulty injecting), Rebif (painful injections), Tecfidera (GI side effects)   Imaging: 06/03/2011:  MRI brain with and without contrast:  single right frontal periventricular white matter lesion with high T2 FLAIR signal 06/03/2011:  MRI cervical spine with and without contrast:  abnormal rim enhancing lesion in the spinal cord at the level of C4 on the right which mildly expands the cord. 01/21/2012:  MRI brain with and without contrast:  unchanged nonspecific right periventricular white matter lesion 01/21/2012: MRI cervical spine with and without contrast: stable nonenhancing cord lesion at C4 11/14/2012: MRI brain with and without contrast.  No change 11/14/2012:  MRI cervical  spine with and without contrast:  No change 12/02/2013: MRI brain with and without contrast:  1. No substantial change in the right frontal juxtacortical, right corona radiata, and left periatrial white matter T2/FLAIR hyperintensities. While nonspecific, these  likely reflect demyelinating lesions in this patient with a history of multiple sclerosis and concomitant cervical cord lesion. No new lesions are identified and none of the existing lesions enhance or restrict diffusion.  2. Subcentimeter left parafalcine dural based lesion as described above has slowly enlarged over time and demonstrates equivocal enhancement, but may represent a meningioma.  Recommend attention on follow up imaging. 12/02/2013:  MRI cervical spine with and without contrast:  No change 12/15/2014:  MRI brain with and without contrast:  1.  Similar appearance of focal T2/FLAIR hyperintense white matter lesion within the right lateral body of the corpus callosum without associated enhancement to suggest active demyelination.  2.  No new or enlarging white matter lesions identified.  3.  Slowly enlarging nonenhancing nodular lesion along the left anterior aspect of the falx cerebri, which may reflect a small meningioma. 12/15/2014:  MRI cervical spine with and without contrast:  no change 10/10/2015:  MRI brain with and without contrast:  no change 10/10/2015:  MRI cervical spine with and without contrast:  no change 02/07/2017:  MRI brain with and without contrast: No significant change since 12/24/2008 02/07/2017: MRI cervical spine with and without contrast: Chronic lesion at C4 level, stable since 2013 02/19/2018:  MRI brain with and without contrast: Compared to prior imaging from 02/07/17, mildly progressed bifrontal cerebral white matter changes, right greater than left, but no abnormal enhancement to suggest active disease. 02/19/2018:  MRI cervical spine with and without contrast: Again demonstrated chronic lesion at C4 level as well as probable subtle patchy cord signal abnormality within the left cord at level of C6-7 not definitely seen on prior imaging from 02/07/17 but no abnormal enhancement to suggest active disease. 10/30/2019 MRI brain and cervical spine with and without  contrast:  no significant change compared to prior imaging from 2019 01/23/2021 MRI brain and cervical spine with and without contrast:  stable compared to imaging from 10/30/2019  PAST MEDICAL HISTORY: Past Medical History:  Diagnosis Date   ALLERGIC RHINITIS 03/13/2007   Allergy    Anemia, B12 deficiency    Anxiety    C. difficile colitis    CARPAL TUNNEL SYNDROME, BILATERAL 01/13/2010   Cervical lesion 07/13/2011   Spine lesion - indeterminate, for f/u MRI may 2013 per neurology   Chronic fatigue    CTS (carpal tunnel syndrome)    Diabetes mellitus without complication ([place])    ESSENTIAL HYPERTENSION 03/13/2007   Fibromyalgia    GERD (gastroesophageal reflux disease)    Heart murmur    Hx of adenomatous polyp of colon 06/09/2009   Hyperlipidemia    HYPERLIPIDEMIA 03/13/2007   Irritable bowel syndrome 05/24/2010   Multiple sclerosis ([place])    OBSTRUCTIVE SLEEP APNEA 12/08/2008   Sleep apnea    uses c pap   VITAMIN D DEFICIENCY 01/13/2010    MEDICATIONS: Current Outpatient Medications on File Prior to Visit  Medication Sig Dispense Refill   amLODipine (NORVASC) 10 MG tablet Take 1 tablet (10 mg total) by mouth daily. 90 tablet 3   atorvastatin (LIPITOR) 40 MG tablet TAKE 1 TABLET BY MOUTH  DAILY 100 tablet 2   AUBAGIO 14 MG TABS TAKE 1 TABLET BY MOUTH  DAILY 30 tablet 5   baclofen (LIORESAL) 10 MG tablet TAKE 1  TABLET BY MOUTH 3 TIMES  DAILY 90 tablet 0   cetirizine (ZYRTEC) 10 MG tablet Take 10 mg by mouth daily.       Cholecalciferol (VITAMIN D) 2000 UNITS CAPS Take 2 capsules by mouth every morning. Reported on 06/05/2015     Continuous Blood Gluc Sensor (FREESTYLE LIBRE 2 SENSOR) MISC 1 Device by Does not apply route every 14 (fourteen) days. 6 each 3   cyanocobalamin (,VITAMIN B-12,) 1000 MCG/ML injection Inject into the muscle every 30 (thirty) days.     famotidine (PEPCID) 20 MG tablet TAKE 1 TABLET BY MOUTH  TWICE DAILY 180 tablet 3   furosemide (LASIX) 40 MG tablet TAKE 1  TABLET BY MOUTH DAILY 90 tablet 3   gabapentin (NEURONTIN) 300 MG capsule TAKE 2 CAPSULES BY MOUTH IN THE  MORNING 1 CAPSULE BY MOUTH IN  THE AFTERNOON AND 2 CAPSULES BY  MOUTH IN THE EVENING 450 capsule 3   glucose blood (FREESTYLE TEST STRIPS) test strip 1 each by Other route 2 (two) times daily. And lancets, 2 times daily. 180 each 3   glucose monitoring kit (FREESTYLE) monitoring kit 1 each by Does not apply route as needed for other. Dispense any model that is covered- dispense testing supplies for Q AC/ HS accuchecks- 1 month supply with one refil. 1 each 1   insulin glargine (LANTUS SOLOSTAR) 100 UNIT/ML Solostar Pen Inject 28 Units into the skin at bedtime. 30 mL PRN   insulin lispro (HUMALOG KWIKPEN) 100 UNIT/ML KwikPen 3 times a day (just before each meal), 23-18-12 and pen needles 4/day. 60 mL 3   Insulin Pen Needle (B-D UF III MINI PEN NEEDLES) 31G X 5 MM MISC USE TO INJECT HUMALOG  KWIKPEN INTO THE SKIN 3  TIMES DAILY 270 each 2   loperamide (IMODIUM) 2 MG capsule Take 2 mg by mouth as needed for diarrhea or loose stools.     losartan (COZAAR) 100 MG tablet TAKE 1 TABLET BY MOUTH  DAILY 90 tablet 3   medroxyPROGESTERone (DEPO-PROVERA) 150 MG/ML injection Inject 150 mg into the muscle every 3 (three) months.       meloxicam (MOBIC) 15 MG tablet Take 1 tablet (15 mg total) by mouth daily. 90 tablet 3   metFORMIN (GLUCOPHAGE-XR) 500 MG 24 hr tablet TAKE 2 TABLETS BY MOUTH  DAILY 180 tablet 3   metoprolol tartrate (LOPRESSOR) 100 MG tablet TAKE 2 TABLETS BY MOUTH  DAILY 180 tablet 3   nortriptyline (PAMELOR) 50 MG capsule TAKE 2 CAPSULES BY MOUTH AT BEDTIME 180 capsule 3   omeprazole-sodium bicarbonate (ZEGERID) 40-1100 MG per capsule Take 1 capsule by mouth daily.       potassium chloride (KLOR-CON) 10 MEQ tablet TAKE 2 TABLETS BY MOUTH  DAILY 180 tablet 3   Semaglutide,0.25 or 0.5MG /DOS, (OZEMPIC, 0.25 OR 0.5 MG/DOSE,) 2 MG/1.5ML SOPN Inject 0.5 mg into the skin once a week. 4.5 mL 3    traMADol (ULTRAM) 50 MG tablet Take 1 tablet (50 mg total) by mouth every 6 (six) hours as needed. 20 tablet 1   No current facility-administered medications on file prior to visit.    ALLERGIES: Allergies  Allergen Reactions   Ciprofloxacin In D5w Rash   Penicillins     REACTION: rash   Tricor [Fenofibrate] Rash    FAMILY HISTORY: Family History  Problem Relation Age of Onset   Migraines Mother    Hypertension Father    COPD Father    Diabetes Father  Prostate cancer Maternal Uncle        prostate cancer   Lung cancer Maternal Grandmother    Stroke Maternal Grandfather    Heart attack Maternal Grandfather    Diabetes Other    Hypertension Other    Asthma Daughter    Colon cancer Neg Hx    Esophageal cancer Neg Hx    Rectal cancer Neg Hx    Stomach cancer Neg Hx       Objective:  Blood pressure 126/79, pulse 85, resp. rate 18, height 5\' 1"  (1.549 m), weight 190 lb (86.2 kg), SpO2 98 %. General: No acute distress.  Patient appears well-groomed.   Head:  Normocephalic/atraumatic Eyes:  Fundi examined but not visualized Back:  left lower lumbar tenderness Neurological Exam: alert and oriented to person, place, and time.  Speech fluent and not dysarthric, language intact.  CN II-XII intact.  Bulk and tone normal, muscle strength 5-/5 bilateral deltoids, right grip and right lower extremity, otherwise 5/5 throughout.  Slight postural tremor in right hand.  Sensation to pinprick and vibration intact.  Deep tendon reflexes 2+ throughout, toes downgoing.  Finger to nose testing intact.  Gait slightly broad-based and cautious but steady.  Romberg negative.  Negative straight leg raise.   Metta Clines, DO  CC: Cathlean Cower, MD

## 2022-07-08 ENCOUNTER — Ambulatory Visit: Payer: Medicare Other | Admitting: Internal Medicine

## 2022-07-08 ENCOUNTER — Ambulatory Visit: Payer: Medicare Other | Admitting: Neurology

## 2022-07-16 ENCOUNTER — Other Ambulatory Visit: Payer: Self-pay | Admitting: Neurology

## 2022-07-16 ENCOUNTER — Other Ambulatory Visit: Payer: Self-pay | Admitting: Internal Medicine

## 2022-07-18 DIAGNOSIS — B079 Viral wart, unspecified: Secondary | ICD-10-CM | POA: Diagnosis not present

## 2022-08-02 ENCOUNTER — Other Ambulatory Visit: Payer: Self-pay | Admitting: Neurology

## 2022-08-03 ENCOUNTER — Other Ambulatory Visit: Payer: Self-pay

## 2022-08-03 MED ORDER — NORTRIPTYLINE HCL 50 MG PO CAPS: 100.0000 mg | ORAL_CAPSULE | Freq: Every day | ORAL | 0 refills | Status: DC

## 2022-08-03 NOTE — Telephone Encounter (Signed)
Please give the office a call to schedule f/u appt with Dr.Jaffe.

## 2022-08-04 NOTE — Telephone Encounter (Signed)
Patient is scheduled for 4/25

## 2022-08-05 ENCOUNTER — Ambulatory Visit (INDEPENDENT_AMBULATORY_CARE_PROVIDER_SITE_OTHER): Payer: Medicare Other

## 2022-08-05 DIAGNOSIS — Z3042 Encounter for surveillance of injectable contraceptive: Secondary | ICD-10-CM

## 2022-08-05 DIAGNOSIS — Z309 Encounter for contraceptive management, unspecified: Secondary | ICD-10-CM

## 2022-08-05 MED ORDER — MEDROXYPROGESTERONE ACETATE 400 MG/ML IM SUSP
150.0000 mg | Freq: Once | INTRAMUSCULAR | Status: AC
  Administered 2022-08-05: 152 mg via INTRAMUSCULAR

## 2022-08-09 NOTE — Progress Notes (Unsigned)
NEUROLOGY FOLLOW UP OFFICE NOTE  Courtney [name] [id number]  Assessment/Plan:   Multiple sclerosis     DMT:  Teriflunomide 14mg  daily Pain management:  Gabapentin 600mg /300mg /600mg , baclofen, nortriptyline 100mg  at bedtime D3 10,000 IU daily Check vit D, CBC with diff, LFTs today and in 6 months Check MRI of brain and cervical spine with and without contrast in 6 months Follow up after repeat tests in 6 months.      Subjective:  Courtney [name] is a 47 year old right-handed Caucasian woman who follows up for multiple sclerosis.   UPDATE: Current DMT:  Aubagio Other current medications: baclofen 10 mg, gabapentin 600mg /300mg /600mg , D3 10000 IU daily, B12 injection every 30 days, nortriptyline 100 mg   Vision:No issues Motor: notes residual right upper and lower extremity weakness.  Notes grip in both hands aren't as strong. Sensory: may get facial numbness with heat and stress.  Pain: stable Gait: balance okay but feels that she is walking a little slower. Bowel/Bladder:  No change Fatigue: No Cognition: Sometimes she has trouble remembering new information Mood: Stable.  Has had stress as her father fell and fractured his back.  In hospital for 2-3 weeks.       HISTORY: Onset of symptoms and diagnosis occurred in 2006.  She experienced an episode of numbness in her right arm and both lower extremities.  She was dropping objects.  MRI of brain reportedly showed "solitary right mid-to-posterior frontal periventricular white matter lesion".  MRI of spine revealed no cord demyelination.  CSF analysis from lumbar puncture reportedly revealed oligoclonal bands and an elevated IgG index.  She was diagnosed with clinically isolated syndrome but was officially diagnosed with multiple sclerosis a few months later when she developed a relapse with left hand numbness.  In February 2013, she developed left arm pain and swelling.  MRI of cervical spine revealed an enhancing spinal  cord lesion at C4 on the right.  She was treated with IV Solumedrol.  In 2014, she had an episode of optic neuritis in her right eye, treated with IV Solumedrol.  In September-October 2019, she reported to 4 weeks history of tingling in her face, which may occur off and on for 10 minutes 3 times a week.  She was prescribed a prednisone taper.  Compared to prior MRI from 02/07/2017, repeat MRI of the brain and cervical spine with and without contrast from 02/19/2018, which were personally reviewed, demonstrated mildly progressed bifrontal cerebral white matter changes as well as probable additional abnormal signal in the left cord at the C6-7 level but no abnormal enhancement to suggest active demyelinating disease. Questionable if paresthesias were secondary to B12 deficiency as well.   Other history:  History of migraines and ocular migraines   There is no family history of MS.   Past disease modifying therapy:  Avonex (difficulty injecting), Rebif (painful injections), Tecfidera (GI side effects)   Imaging: 06/03/2011:  MRI brain with and without contrast:  single right frontal periventricular white matter lesion with high T2 FLAIR signal 06/03/2011:  MRI cervical spine with and without contrast:  abnormal rim enhancing lesion in the spinal cord at the level of C4 on the right which mildly expands the cord. 01/21/2012:  MRI brain with and without contrast:  unchanged nonspecific right periventricular white matter lesion 01/21/2012: MRI cervical spine with and without contrast: stable nonenhancing cord lesion at C4 11/14/2012: MRI brain with and without contrast.  No change 11/14/2012:  MRI cervical spine with and without  contrast:  No change 12/02/2013: MRI brain with and without contrast:  1. No substantial change in the right frontal juxtacortical, right corona radiata, and left periatrial white matter T2/FLAIR hyperintensities. While nonspecific, these likely reflect demyelinating lesions in this  patient with a history of multiple sclerosis and concomitant cervical cord lesion. No new lesions are identified and none of the existing lesions enhance or restrict diffusion.  2. Subcentimeter left parafalcine dural based lesion as described above has slowly enlarged over time and demonstrates equivocal enhancement, but may represent a meningioma.  Recommend attention on follow up imaging. 12/02/2013:  MRI cervical spine with and without contrast:  No change 12/15/2014:  MRI brain with and without contrast:  1.  Similar appearance of focal T2/FLAIR hyperintense white matter lesion within the right lateral body of the corpus callosum without associated enhancement to suggest active demyelination.  2.  No new or enlarging white matter lesions identified.  3.  Slowly enlarging nonenhancing nodular lesion along the left anterior aspect of the falx cerebri, which may reflect a small meningioma. 12/15/2014:  MRI cervical spine with and without contrast:  no change 10/10/2015:  MRI brain with and without contrast:  no change 10/10/2015:  MRI cervical spine with and without contrast:  no change 02/07/2017:  MRI brain with and without contrast: No significant change since 12/24/2008 02/07/2017: MRI cervical spine with and without contrast: Chronic lesion at C4 level, stable since 2013 02/19/2018:  MRI brain with and without contrast: Compared to prior imaging from 02/07/17, mildly progressed bifrontal cerebral white matter changes, right greater than left, but no abnormal enhancement to suggest active disease. 02/19/2018:  MRI cervical spine with and without contrast: Again demonstrated chronic lesion at C4 level as well as probable subtle patchy cord signal abnormality within the left cord at level of C6-7 not definitely seen on prior imaging from 02/07/17 but no abnormal enhancement to suggest active disease. 10/30/2019 MRI brain and cervical spine with and without contrast:  no significant change compared to  prior imaging from 2019 01/23/2021 MRI brain and cervical spine with and without contrast:  stable compared to imaging from 10/30/2019  PAST MEDICAL HISTORY: Past Medical History:  Diagnosis Date   ALLERGIC RHINITIS 03/13/2007   Allergy    Anemia, B12 deficiency    Anxiety    C. difficile colitis    CARPAL TUNNEL SYNDROME, BILATERAL 01/13/2010   Cervical lesion 07/13/2011   Spine lesion - indeterminate, for f/u MRI may 2013 per neurology   Chronic fatigue    CTS (carpal tunnel syndrome)    Diabetes mellitus without complication ([facility])    ESSENTIAL HYPERTENSION 03/13/2007   Fibromyalgia    GERD (gastroesophageal reflux disease)    Heart murmur    Hx of adenomatous polyp of colon 06/09/2009   Hyperlipidemia    HYPERLIPIDEMIA 03/13/2007   Irritable bowel syndrome 05/24/2010   Multiple sclerosis ([facility])    OBSTRUCTIVE SLEEP APNEA 12/08/2008   Sleep apnea    uses c pap   VITAMIN D DEFICIENCY 01/13/2010    MEDICATIONS: Current Outpatient Medications on File Prior to Visit  Medication Sig Dispense Refill   amLODipine (NORVASC) 10 MG tablet TAKE 1 TABLET BY MOUTH DAILY 100 tablet 2   atorvastatin (LIPITOR) 40 MG tablet TAKE 1 TABLET BY MOUTH DAILY 100 tablet 2   baclofen (LIORESAL) 10 MG tablet TAKE 1 TABLET BY MOUTH 3 TIMES  DAILY 90 tablet 0   cetirizine (ZYRTEC) 10 MG tablet Take 10 mg by mouth daily.  Cholecalciferol (VITAMIN D) 2000 UNITS CAPS Take 2 capsules by mouth every morning. Reported on 06/05/2015     Continuous Blood Gluc Sensor (FREESTYLE LIBRE 2 SENSOR) MISC 1 Device by Does not apply route every 14 (fourteen) days. 6 each 3   cyanocobalamin (,VITAMIN B-12,) 1000 MCG/ML injection Inject into the muscle every 30 (thirty) days.     famotidine (PEPCID) 20 MG tablet TAKE 1 TABLET BY MOUTH TWICE  DAILY 200 tablet 2   furosemide (LASIX) 40 MG tablet TAKE 1 TABLET BY MOUTH DAILY 100 tablet 2   gabapentin (NEURONTIN) 300 MG capsule TAKE 2 CAPSULES BY MOUTH IN THE  MORNING 1  CAPSULE BY MOUTH IN  THE AFTERNOON AND 2 CAPSULES BY  MOUTH IN THE EVENING 450 capsule 2   glucose blood (FREESTYLE TEST STRIPS) test strip 1 each by Other route 2 (two) times daily. And lancets, 2 times daily. 180 each 3   glucose monitoring kit (FREESTYLE) monitoring kit 1 each by Does not apply route as needed for other. Dispense any model that is covered- dispense testing supplies for Q AC/ HS accuchecks- 1 month supply with one refil. 1 each 1   insulin glargine (LANTUS SOLOSTAR) 100 UNIT/ML Solostar Pen Inject 28-30 Units into the skin at bedtime. 30 mL 3   insulin lispro (HUMALOG KWIKPEN) 100 UNIT/ML KwikPen Inject under skin up to 60 units a day as advised 45 mL 3   Insulin Pen Needle (B-D UF III MINI PEN NEEDLES) 31G X 5 MM MISC USE TO INJECT HUMALOG  KWIKPEN INTO THE SKIN 3  TIMES DAILY 270 each 2   loperamide (IMODIUM) 2 MG capsule Take 2 mg by mouth as needed for diarrhea or loose stools.     losartan (COZAAR) 100 MG tablet TAKE 1 TABLET BY MOUTH DAILY 100 tablet 2   medroxyPROGESTERone (DEPO-PROVERA) 150 MG/ML injection Inject 150 mg into the muscle every 3 (three) months.       meloxicam (MOBIC) 15 MG tablet TAKE 1 TABLET BY MOUTH DAILY 100 tablet 2   metFORMIN (GLUCOPHAGE-XR) 500 MG 24 hr tablet Take 2 tablets (1,000 mg total) by mouth daily. 180 tablet 3   metoprolol tartrate (LOPRESSOR) 100 MG tablet TAKE 2 TABLETS BY MOUTH DAILY 200 tablet 2   nortriptyline (PAMELOR) 50 MG capsule Take 2 capsules (100 mg total) by mouth at bedtime. 200 capsule 0   omeprazole-sodium bicarbonate (ZEGERID) 40-1100 MG per capsule Take 1 capsule by mouth daily.       potassium chloride (KLOR-CON) 10 MEQ tablet TAKE 2 TABLETS BY MOUTH DAILY 200 tablet 2   Semaglutide, 1 MG/DOSE, 4 MG/3ML SOPN Inject 1 mg as directed once a week. 9 mL 3   Teriflunomide 14 MG TABS Take 1 tablet daily 30 tablet 3   traMADol (ULTRAM) 50 MG tablet Take 1 tablet (50 mg total) by mouth every 6 (six) hours as needed. 20 tablet 1    No current facility-administered medications on file prior to visit.    ALLERGIES: Allergies  Allergen Reactions   Ciprofloxacin In D5w Rash   Penicillins     REACTION: rash   Tricor [Fenofibrate] Rash    FAMILY HISTORY: Family History  Problem Relation Age of Onset   Migraines Mother    Hypertension Father    COPD Father    Diabetes Father    Prostate cancer Maternal Uncle        prostate cancer   Lung cancer Maternal Grandmother    Stroke Maternal Grandfather  Heart attack Maternal Grandfather    Diabetes Other    Hypertension Other    Asthma Daughter    Colon cancer Neg Hx    Esophageal cancer Neg Hx    Rectal cancer Neg Hx    Stomach cancer Neg Hx       Objective:  Blood pressure 137/80, pulse 85, height  (1.6 m), weight 195 lb (88.5 kg), SpO2 95 %.. General: No acute distress.  Patient appears well-groomed.   Head:  Normocephalic/atraumatic Eyes:  Fundi examined but not visualized Neurological Exam: Alert and oriented.  Speech fluent and not dysarthric.  Language intact.  CN II-XII intact.  Bulk and tone normal.  Muscle strength 5-/5 bilateral deltoids, right grip and right lower extremity, otherwise, 5/5 throughout.  Slight postural tremor in right hand.  Sensation to pinprick intact.  Slightly reduced vibratory sensation in right foot.  Deep tendon reflexes 1+ throughout, toes downgoing.  Finger to nose testing intact.  Steady gait.  Romberg negative.    Shon Millet, DO  CC: Oliver Barre, MD

## 2022-08-11 ENCOUNTER — Encounter: Payer: Self-pay | Admitting: Neurology

## 2022-08-11 ENCOUNTER — Other Ambulatory Visit (INDEPENDENT_AMBULATORY_CARE_PROVIDER_SITE_OTHER): Payer: Medicare Other

## 2022-08-11 ENCOUNTER — Ambulatory Visit (INDEPENDENT_AMBULATORY_CARE_PROVIDER_SITE_OTHER): Payer: Medicare Other | Admitting: Neurology

## 2022-08-11 ENCOUNTER — Other Ambulatory Visit: Payer: Self-pay

## 2022-08-11 VITALS — BP 137/80 | HR 85 | Ht 63.0 in | Wt 195.0 lb

## 2022-08-11 DIAGNOSIS — G35 Multiple sclerosis: Secondary | ICD-10-CM

## 2022-08-11 LAB — CBC WITH DIFFERENTIAL/PLATELET
Basophils Absolute: 0.1 10*3/uL (ref 0.0–0.1)
Basophils Relative: 0.9 % (ref 0.0–3.0)
Eosinophils Absolute: 0.3 10*3/uL (ref 0.0–0.7)
Eosinophils Relative: 5 % (ref 0.0–5.0)
HCT: 37.2 % (ref 36.0–46.0)
Hemoglobin: 12.4 g/dL (ref 12.0–15.0)
Lymphocytes Relative: 20 % (ref 12.0–46.0)
Lymphs Abs: 1.3 10*3/uL (ref 0.7–4.0)
MCHC: 33.5 g/dL (ref 30.0–36.0)
MCV: 83.8 fl (ref 78.0–100.0)
Monocytes Absolute: 0.4 10*3/uL (ref 0.1–1.0)
Monocytes Relative: 6.1 % (ref 3.0–12.0)
Neutro Abs: 4.5 10*3/uL (ref 1.4–7.7)
Neutrophils Relative %: 68 % (ref 43.0–77.0)
Platelets: 273 10*3/uL (ref 150.0–400.0)
RBC: 4.44 Mil/uL (ref 3.87–5.11)
RDW: 14.6 % (ref 11.5–15.5)
WBC: 6.6 10*3/uL (ref 4.0–10.5)

## 2022-08-11 LAB — HEPATIC FUNCTION PANEL
ALT: 14 U/L (ref 0–35)
AST: 13 U/L (ref 0–37)
Albumin: 4.2 g/dL (ref 3.5–5.2)
Alkaline Phosphatase: 67 U/L (ref 39–117)
Bilirubin, Direct: 0.1 mg/dL (ref 0.0–0.3)
Total Bilirubin: 0.3 mg/dL (ref 0.2–1.2)
Total Protein: 7.2 g/dL (ref 6.0–8.3)

## 2022-08-11 LAB — VITAMIN D 25 HYDROXY (VIT D DEFICIENCY, FRACTURES): VITD: 80.9 ng/mL (ref 30.00–100.00)

## 2022-08-11 MED ORDER — TERIFLUNOMIDE 14 MG PO TABS: ORAL_TABLET | ORAL | 5 refills | Status: DC

## 2022-08-11 MED ORDER — NORTRIPTYLINE HCL 50 MG PO CAPS: 100.0000 mg | ORAL_CAPSULE | Freq: Every day | ORAL | 3 refills | Status: DC

## 2022-08-11 NOTE — Patient Instructions (Addendum)
Teriflunomide  daily Gabapentin Baclofen Nortriptyline D3 10,000 IU daily Check CBC with diff, LFTs and vit D today and in 6 months MRI of brain and cervical spine with and without contrast in 6 months Follow up in 6 months after repeat testing.

## 2022-08-19 ENCOUNTER — Ambulatory Visit: Payer: Medicare Other | Admitting: Internal Medicine

## 2022-08-19 NOTE — Progress Notes (Signed)
Updated charge medication capture with correctly billing codes.

## 2022-08-19 NOTE — Addendum Note (Signed)
Addended by: Theressa Stamps on: 08/19/2022 11:57 AM   Modules accepted: Level of Service

## 2022-08-31 ENCOUNTER — Other Ambulatory Visit: Payer: Self-pay | Admitting: Neurology

## 2022-09-05 DIAGNOSIS — E119 Type 2 diabetes mellitus without complications: Secondary | ICD-10-CM | POA: Diagnosis not present

## 2022-09-22 ENCOUNTER — Encounter: Payer: Self-pay | Admitting: Neurology

## 2022-09-22 ENCOUNTER — Telehealth: Payer: Self-pay

## 2022-09-22 MED ORDER — PREDNISONE 10 MG (21) PO TBPK: ORAL_TABLET | ORAL | 0 refills | Status: DC

## 2022-09-22 NOTE — Telephone Encounter (Signed)
Per Dr.Jaffe, OK to prescribe prednisone taper.  She is supposed to have MRI of brain and cervical spine prior to follow up with me (around October or so).  I would like to add thoracic spine with and without contrast as well.  Taper sent to the [facility]

## 2022-10-07 ENCOUNTER — Other Ambulatory Visit: Payer: Self-pay

## 2022-10-07 ENCOUNTER — Encounter: Payer: Self-pay | Admitting: Neurology

## 2022-10-07 DIAGNOSIS — G35 Multiple sclerosis: Secondary | ICD-10-CM

## 2022-10-07 NOTE — Progress Notes (Signed)
Per Patient Mychart message, I was wondering if the MRI order of Brain, Cervical Spine, and Thoracic Spine with and without contrast could be sent to [facility]. Their fax number is [phone]. I'm supposed to have this scan done in October.    Thank you   Orders added will fax to [facility] close to time.

## 2022-10-10 ENCOUNTER — Other Ambulatory Visit: Payer: Self-pay | Admitting: Podiatry

## 2022-10-10 ENCOUNTER — Other Ambulatory Visit: Payer: Self-pay | Admitting: Internal Medicine

## 2022-10-16 ENCOUNTER — Other Ambulatory Visit: Payer: Self-pay | Admitting: Neurology

## 2022-11-24 DIAGNOSIS — E119 Type 2 diabetes mellitus without complications: Secondary | ICD-10-CM | POA: Diagnosis not present

## 2022-11-29 ENCOUNTER — Other Ambulatory Visit: Payer: Self-pay

## 2022-11-29 ENCOUNTER — Other Ambulatory Visit: Payer: Self-pay | Admitting: Podiatry

## 2022-11-29 ENCOUNTER — Other Ambulatory Visit: Payer: Self-pay | Admitting: Internal Medicine

## 2022-12-02 ENCOUNTER — Encounter: Payer: Self-pay | Admitting: Neurology

## 2022-12-09 ENCOUNTER — Ambulatory Visit (INDEPENDENT_AMBULATORY_CARE_PROVIDER_SITE_OTHER): Payer: Medicare Other

## 2022-12-09 DIAGNOSIS — Z3002 Counseling and instruction in natural family planning to avoid pregnancy: Secondary | ICD-10-CM | POA: Diagnosis not present

## 2022-12-09 DIAGNOSIS — E538 Deficiency of other specified B group vitamins: Secondary | ICD-10-CM | POA: Diagnosis not present

## 2022-12-09 MED ORDER — CYANOCOBALAMIN 1000 MCG/ML IJ SOLN
1000.0000 ug | Freq: Once | INTRAMUSCULAR | Status: AC
Start: 2022-12-09 — End: 2022-12-09
  Administered 2022-12-09: 1000 ug via INTRAMUSCULAR

## 2022-12-09 MED ORDER — MEDROXYPROGESTERONE ACETATE 150 MG/ML IM SUSY
150.0000 mg | PREFILLED_SYRINGE | Freq: Once | INTRAMUSCULAR | Status: AC
Start: 2022-12-09 — End: 2022-12-09
  Administered 2022-12-09: 150 mg via INTRAMUSCULAR

## 2022-12-09 NOTE — Progress Notes (Signed)
Pt was given B12 w/o any complications.  Pt was give Medroxyprogesterone Acetate 150mg  as part of Contraceptive to prevent pregnancy w/o any complications.

## 2023-01-12 ENCOUNTER — Ambulatory Visit: Payer: Medicare Other

## 2023-01-13 ENCOUNTER — Ambulatory Visit: Payer: Medicare Other

## 2023-01-20 ENCOUNTER — Ambulatory Visit: Payer: Medicare Other

## 2023-01-24 ENCOUNTER — Encounter: Payer: Self-pay | Admitting: Neurology

## 2023-01-24 ENCOUNTER — Other Ambulatory Visit: Payer: Self-pay | Admitting: Neurology

## 2023-01-26 ENCOUNTER — Other Ambulatory Visit: Payer: Self-pay

## 2023-02-06 ENCOUNTER — Other Ambulatory Visit: Payer: Self-pay

## 2023-02-06 ENCOUNTER — Other Ambulatory Visit: Payer: Self-pay | Admitting: Internal Medicine

## 2023-02-07 ENCOUNTER — Encounter: Payer: Self-pay | Admitting: Neurology

## 2023-02-10 ENCOUNTER — Ambulatory Visit: Payer: Medicare Other | Admitting: Neurology

## 2023-02-18 DIAGNOSIS — G35 Multiple sclerosis: Secondary | ICD-10-CM | POA: Diagnosis not present

## 2023-02-20 NOTE — Telephone Encounter (Signed)
MRIs of brain/cervical/thoracic spine appear overall stable.  I would like for Ms. Sidle to follow up.

## 2023-03-03 ENCOUNTER — Ambulatory Visit: Payer: Medicare Other

## 2023-03-04 ENCOUNTER — Other Ambulatory Visit: Payer: Self-pay | Admitting: Internal Medicine

## 2023-03-06 ENCOUNTER — Other Ambulatory Visit: Payer: Self-pay

## 2023-03-10 ENCOUNTER — Ambulatory Visit (INDEPENDENT_AMBULATORY_CARE_PROVIDER_SITE_OTHER): Payer: Medicare Other

## 2023-03-10 DIAGNOSIS — Z23 Encounter for immunization: Secondary | ICD-10-CM | POA: Diagnosis not present

## 2023-03-10 DIAGNOSIS — E538 Deficiency of other specified B group vitamins: Secondary | ICD-10-CM

## 2023-03-10 MED ORDER — CYANOCOBALAMIN 1000 MCG/ML IJ SOLN
1000.0000 ug | Freq: Once | INTRAMUSCULAR | Status: AC
Start: 2023-03-10 — End: 2023-03-10
  Administered 2023-03-10: 1000 ug via INTRAMUSCULAR

## 2023-03-10 NOTE — Progress Notes (Addendum)
PT visits today for their regular dose flu shot and their b-12 injeciton. PT was informed of what they were receiving and tolerated injection well. PT informed to reach out to office if needed.

## 2023-03-24 ENCOUNTER — Ambulatory Visit: Payer: Medicare Other | Admitting: Internal Medicine

## 2023-03-30 ENCOUNTER — Encounter: Payer: Self-pay | Admitting: Internal Medicine

## 2023-03-30 ENCOUNTER — Ambulatory Visit (INDEPENDENT_AMBULATORY_CARE_PROVIDER_SITE_OTHER): Payer: Medicare Other | Admitting: Internal Medicine

## 2023-03-30 VITALS — BP 138/70 | HR 88 | Ht 63.0 in | Wt 202.4 lb

## 2023-03-30 DIAGNOSIS — E7800 Pure hypercholesterolemia, unspecified: Secondary | ICD-10-CM

## 2023-03-30 DIAGNOSIS — Z794 Long term (current) use of insulin: Secondary | ICD-10-CM

## 2023-03-30 DIAGNOSIS — E1165 Type 2 diabetes mellitus with hyperglycemia: Secondary | ICD-10-CM | POA: Diagnosis not present

## 2023-03-30 LAB — POCT GLYCOSYLATED HEMOGLOBIN (HGB A1C): Hemoglobin A1C: 7.9 % — AB (ref 4.0–5.6)

## 2023-03-30 MED ORDER — LANTUS SOLOSTAR 100 UNIT/ML ~~LOC~~ SOPN: 34.0000 [IU] | PEN_INJECTOR | Freq: Every day | SUBCUTANEOUS | 3 refills | Status: DC

## 2023-03-30 MED ORDER — METFORMIN HCL ER 500 MG PO TB24: 1000.0000 mg | ORAL_TABLET | Freq: Every day | ORAL | 3 refills | Status: DC

## 2023-03-30 MED ORDER — SEMAGLUTIDE (1 MG/DOSE) 4 MG/3ML ~~LOC~~ SOPN: 1.0000 mg | PEN_INJECTOR | SUBCUTANEOUS | 3 refills | Status: DC

## 2023-03-30 MED ORDER — INSULIN LISPRO (1 UNIT DIAL) 100 UNIT/ML (KWIKPEN): PEN_INJECTOR | SUBCUTANEOUS | 3 refills | Status: AC

## 2023-03-30 MED ORDER — INSULIN PEN NEEDLE 32G X 4 MM MISC: 3 refills | Status: AC

## 2023-03-30 NOTE — Patient Instructions (Addendum)
Please increase: - Lantus 34-36 units at bedtime  Move: - Metforin ER 1000 mg with dinner  Continue: - Humalog - 15 min before meals: - before coffee: 20 units - before lunch: 16-20 units - before dinner: 16-20 units  - Ozempic 1 mg weekly  Restart the CGM.  Please return in 3 months.

## 2023-03-30 NOTE — Progress Notes (Signed)
Patient ID: Courtney [name], female   DOB: 1976/01/05, 47 y.o.   MRN: [id number]  HPI: Courtney [name] is a 47 y.o.-year-old female, returning for follow-up for DM2, dx in 2016 (when she presented in hyperglycemic nonketotic hyperosmolar state), insulin-dependent since 2018, uncontrolled, without long-term complications. Pt. previously saw Dr. Everardo All, but last visit with me 1 year ago.  Interim history: No increased urination, blurry vision, nausea, chest pain. Her father fell and fractured back >> she is a caregiver >> high stress.  Reviewed HbA1c: Lab Results  Component Value Date   HGBA1C 6.9 (A) 04/15/2022   HGBA1C 6.6 (A) 12/09/2021   HGBA1C 7.2 (A) 08/23/2021   HGBA1C 8.2 (H) 06/24/2021   HGBA1C 8.4 (A) 05/07/2021   HGBA1C 8.7 (A) 03/05/2021   HGBA1C 8.0 (A) 01/04/2021   HGBA1C 8.1 (H) 12/24/2020   HGBA1C 7.4 (A) 05/13/2020   HGBA1C 8.0 (A) 03/05/2020   At last visit, she was on: - Rybelsus 14 mg at waking up >> Ozempic 0.5 mg weekly -tolerated well - Lantus 28 units at bedtime - Lispro 23-18-12 units before meals >> 15 min before meals She tried metformin but this caused nausea. She tried Invokana but this caused vaginitis. Per review of Dr. George Hugh note, she declined weight loss surgery.   Also, per his note, she declined an insulin pump.  I advised her to change to: - Lantus 28 units at bedtime - Humalog - 15 min before meals: - before coffee: 20 units - before lunch: 16-20 units - before dinner: 14-18 units  - Ozempic 1 mg weekly  Currently also on: - Metformin ER 1000 mg in am  Pt checks her sugars >4x a day - but not in last mo as the DME supplier ([employer]) requested more recent OV notes and she has not been seen in a year. Now manually: - am:270-280 - 2h after b'fast: n/c - lunch: 170s - 2h after lunch: occas. 300 - dinner: 177-200 - 2h after dinner: <300 - bedtime: n/c  Before she ran out of sensors:   Previously:  Previously:   Lowest sugar was  65 >> 100 >> 100; she has hypoglycemia awareness at 70.  Highest sugar was 233 >> 240 >> 300s  - no CKD, last BUN/creatinine:  Lab Results  Component Value Date   BUN 14 01/07/2022   BUN 11 06/24/2021   CREATININE 1.06 01/07/2022   CREATININE 0.88 06/24/2021   Lab Results  Component Value Date   MICRALBCREAT 0.7 01/07/2022   MICRALBCREAT 1.7 06/24/2021   MICRALBCREAT 3.0 06/12/2019   MICRALBCREAT 5.9 01/03/2018   MICRALBCREAT 1.9 02/17/2016  On Cozaar 100 mg daily.  -+ HL; last set of lipids: Lab Results  Component Value Date   CHOL 106 01/07/2022   HDL 33.20 (L) 01/07/2022   LDLCALC 37 01/07/2022   LDLDIRECT 53.0 06/24/2021   TRIG 182.0 (H) 01/07/2022   CHOLHDL 3 01/07/2022  On atorvastatin 40 mg daily.  - last eye exam was in 02/2022. No DR reportedly.   - no numbness and tingling in her feet.  Last foot exam 01/04/2021.  She is on gabapentin 300 mg tablets: 2-1-2.  Last foot exam 01/07/2022.  She also has a history of HTN, MS, fibromyalgia, chronic fatigue, OSA-on CPAP, GERD, IBS, vitamin D deficiency, B12 anemia.  ROS: + see HPI  Past Medical History:  Diagnosis Date   ALLERGIC RHINITIS 03/13/2007   Allergy    Anemia, B12 deficiency    Anxiety  C. difficile colitis    CARPAL TUNNEL SYNDROME, BILATERAL 01/13/2010   Cervical lesion 07/13/2011   Spine lesion - indeterminate, for f/u MRI may 2013 per neurology   Chronic fatigue    CTS (carpal tunnel syndrome)    Diabetes mellitus without complication (HCC)    ESSENTIAL HYPERTENSION 03/13/2007   Fibromyalgia    GERD (gastroesophageal reflux disease)    Heart murmur    Hx of adenomatous polyp of colon 06/09/2009   Hyperlipidemia    HYPERLIPIDEMIA 03/13/2007   Irritable bowel syndrome 05/24/2010   Multiple sclerosis (HCC)    OBSTRUCTIVE SLEEP APNEA 12/08/2008   Sleep apnea    uses c pap   VITAMIN D DEFICIENCY 01/13/2010   Past Surgical History:  Procedure Laterality Date   APPENDECTOMY  07/23/12   CARPAL  TUNNEL RELEASE  01/2010   bilateral   COLONOSCOPY W/ BIOPSIES AND POLYPECTOMY  05/2009   2 tubular adenomas   LAPAROSCOPIC APPENDECTOMY N/A 07/23/2012   Procedure: APPENDECTOMY LAPAROSCOPIC;  Surgeon: Atilano Ina, MD;  Location: [facility] OR;  Service: General;  Laterality: N/A;   Social History   Socioeconomic History   Marital status: Single    Spouse name: Not on file   Number of children: 2   Years of education: Not on file   Highest education level: Not on file  Occupational History   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    Employer: UNEMPLOYED  Tobacco Use   Smoking status: Never   Smokeless tobacco: Never  Vaping Use   Vaping status: Never Used  Substance and Sexual Activity   Alcohol use: No    Alcohol/week: 0.0 standard drinks of alcohol   Drug use: No   Sexual activity: Not on file  Other Topics Concern   Not on file  Social History Narrative   Daily Caffeine   Right handed   Two story home   Social Drivers of Health   Financial Resource Strain: Not on file  Food Insecurity: Not on file  Transportation Needs: Not on file  Physical Activity: Not on file  Stress: Not on file  Social Connections: Not on file  Intimate Partner Violence: Not on file   Current Outpatient Medications on File Prior to Visit  Medication Sig Dispense Refill   amLODipine (NORVASC) 10 MG tablet TAKE 1 TABLET BY MOUTH DAILY 100 tablet 2   atorvastatin (LIPITOR) 40 MG tablet TAKE 1 TABLET BY MOUTH DAILY 100 tablet 2   baclofen (LIORESAL) 10 MG tablet TAKE 1 TABLET BY MOUTH 3 TIMES  DAILY 90 tablet 1   cetirizine (ZYRTEC) 10 MG tablet Take 10 mg by mouth daily.       Cholecalciferol (VITAMIN D) 2000 UNITS CAPS Take 2 capsules by mouth every morning. Reported on 06/05/2015     Continuous Blood Gluc Sensor (FREESTYLE LIBRE 2 SENSOR) MISC 1 Device by Does not apply route every 14 (fourteen) days. 6 each 3   cyanocobalamin (,VITAMIN B-12,) 1000 MCG/ML injection Inject into the muscle every 30  (thirty) days.     famotidine (PEPCID) 20 MG tablet TAKE 1 TABLET BY MOUTH TWICE  DAILY 200 tablet 2   furosemide (LASIX) 40 MG tablet TAKE 1 TABLET BY MOUTH DAILY 100 tablet 2   gabapentin (NEURONTIN) 300 MG capsule TAKE 2 CAPSULES BY MOUTH IN THE  MORNING 1 CAPSULE BY MOUTH IN  THE AFTERNOON AND 2 CAPSULES BY  MOUTH IN THE EVENING 450 capsule 2   glucose blood (FREESTYLE TEST STRIPS) test strip  1 each by Other route 2 (two) times daily. And lancets, 2 times daily. 180 each 3   glucose monitoring kit (FREESTYLE) monitoring kit 1 each by Does not apply route as needed for other. Dispense any model that is covered- dispense testing supplies for Q AC/ HS accuchecks- 1 month supply with one refil. 1 each 1   insulin glargine (LANTUS SOLOSTAR) 100 UNIT/ML Solostar Pen Inject 28-30 Units into the skin at bedtime. 30 mL 3   insulin lispro (HUMALOG KWIKPEN) 100 UNIT/ML KwikPen Inject under skin up to 60 units a day as advised 45 mL 3   Insulin Pen Needle (B-D UF III MINI PEN NEEDLES) 31G X 5 MM MISC USE TO INJECT HUMALOG  KWIKPEN INTO THE SKIN 3  TIMES DAILY 270 each 2   loperamide (IMODIUM) 2 MG capsule Take 2 mg by mouth as needed for diarrhea or loose stools.     losartan (COZAAR) 100 MG tablet TAKE 1 TABLET BY MOUTH DAILY 100 tablet 2   medroxyPROGESTERone (DEPO-PROVERA) 150 MG/ML injection Inject 150 mg into the muscle every 3 (three) months.       meloxicam (MOBIC) 15 MG tablet TAKE 1 TABLET BY MOUTH DAILY 100 tablet 2   metFORMIN (GLUCOPHAGE-XR) 500 MG 24 hr tablet Take 2 tablets (1,000 mg total) by mouth daily. 180 tablet 3   metoprolol tartrate (LOPRESSOR) 100 MG tablet TAKE 2 TABLETS BY MOUTH DAILY 200 tablet 2   nortriptyline (PAMELOR) 50 MG capsule Take 2 capsules (100 mg total) by mouth at bedtime. 180 capsule 3   omeprazole-sodium bicarbonate (ZEGERID) 40-1100 MG per capsule Take 1 capsule by mouth daily.       potassium chloride (KLOR-CON) 10 MEQ tablet TAKE 2 TABLETS BY MOUTH DAILY 200  tablet 2   predniSONE (STERAPRED UNI-PAK 21 TAB) 10 MG (21) TBPK tablet take 60mg  day 1, then 50mg  day 2, then 40mg  day 3, then 30mg  day 4, then 20mg  day 5, then 10mg  day 6, then STOP 21 tablet 0   Semaglutide, 1 MG/DOSE, 4 MG/3ML SOPN Inject 1 mg as directed once a week. 9 mL 3   Teriflunomide 14 MG TABS Take 1 tablet daily 30 tablet 5   traMADol (ULTRAM) 50 MG tablet Take 1 tablet (50 mg total) by mouth every 6 (six) hours as needed. 20 tablet 1   No current facility-administered medications on file prior to visit.   Allergies  Allergen Reactions   Ciprofloxacin In D5w Rash   Penicillins     REACTION: rash   Tricor [Fenofibrate] Rash   Family History  Problem Relation Age of Onset   Migraines Mother    Hypertension Father    COPD Father    Diabetes Father    Prostate cancer Maternal Uncle        prostate cancer   Lung cancer Maternal Grandmother    Stroke Maternal Grandfather    Heart attack Maternal Grandfather    Diabetes Other    Hypertension Other    Asthma Daughter    Colon cancer Neg Hx    Esophageal cancer Neg Hx    Rectal cancer Neg Hx    Stomach cancer Neg Hx    PE: There were no vitals taken for this visit. Wt Readings from Last 3 Encounters:  08/11/22 195 lb (88.5 kg)  04/15/22 196 lb 9.6 oz (89.2 kg)  01/07/22 190 lb (86.2 kg)   Constitutional: overweight, in NAD Eyes: no exophthalmos ENT: no thyromegaly, no cervical lymphadenopathy Cardiovascular: RRR,  No MRG, + B LE edema (chronic) Respiratory: CTA B Musculoskeletal: no deformities Skin: no rashes Neurological: + Mild tremor with outstretched hands (chronic) Diabetic Foot Exam - Simple   Simple Foot Form Diabetic Foot exam was performed with the following findings: Yes 03/30/2023  3:48 PM  Visual Inspection No deformities, no ulcerations, no other skin breakdown bilaterally: Yes Sensation Testing Intact to touch and monofilament testing bilaterally: Yes Pulse Check Posterior Tibialis and  Dorsalis pulse intact bilaterally: Yes Comments + B medial hallux callus    ASSESSMENT: 1. DM2, insulin-dependent, uncontrolled, without long-term complications  2. HL  PLAN:  1. Patient with longstanding, uncontrolled, type 2 diabetes, returning after long absence.  I last saw her a year ago.  At that time, sugars are fluctuating in the upper half of the target range with spikes above the target range in the morning and after dinner.  We discussed about stopping creamer in her coffee and also to take Humalog before the meals.  I also advised her to increase Ozempic.  We did discuss about possibly backing off the Humalog doses after increasing Ozempic, if sugars improved.  HbA1c at last visit was 6.9%. CGM interpretation: -At today's visit, we reviewed her CGM downloads from a month ago, before she ran out of sensors: It appears that 81% of values are in target range (goal >70%), while 19% are higher than 180 (goal <25%), and 0% are lower than 70 (goal <4%).  The calculated average blood sugar is 156.  The projected HbA1c for the next 3 months (GMI) is 7.0%. -Reviewing the CGM trends, sugars were higher overnight, but they were improving during the day and they were quite bearable after dinner.  Unfortunately, she came off the sensor afterwards and for the last month sugars have been higher. -She is currently on approximately the same regimen insulin last visit, but also on metformin 1000 mg in the morning.  At today's visit, we discussed about moving the metformin with dinner to hopefully improve the blood sugars overnight but also to increase her Lantus slightly.  We also discussed that for larger meals, especially, she will need more Humalog before dinner.  I did not feel that we absolutely needed to increase Ozempic at this time, but may need to do this at next visit. -We also sent a new prescription for the CGM to her supplier.  Also refilled her diabetic prescriptions. - I suggested to:   Patient Instructions  Please increase: - Lantus 34-36 units at bedtime  Move: - Metforin ER 1000 mg with dinner  Continue: - Humalog - 15 min before meals: - before coffee: 20 units - before lunch: 16-20 units - before dinner: 16-20 units  - Ozempic 1 mg weekly  Restart the CGM.  Please return in 3 months.  - we checked her HbA1c: 7.9% (higher) - advised to check sugars at different times of the day - 4x a day, rotating check times - advised for yearly eye exams >> she is not UTD - will check annual labs today - return to clinic in 4 months  2. HL -Latest lipid panel was reviewed from last year: LDL at goal, triglycerides slightly high, HDL slightly low Lab Results  Component Value Date   CHOL 106 01/07/2022   HDL 33.20 (L) 01/07/2022   LDLCALC 37 01/07/2022   LDLDIRECT 53.0 06/24/2021   TRIG 182.0 (H) 01/07/2022   CHOLHDL 3 01/07/2022  -She is on Lipitor 40 mg daily without side effects.  Component  Latest Ref Rng 03/30/2023  Total Bilirubin     0.2 - 1.2 mg/dL 0.4   AST     10 - 35 U/L 22   ALT     6 - 29 U/L 29   Total Protein     6.1 - 8.1 g/dL 6.8   Hemoglobin F6O     4.0 - 5.6 % 7.9 !   Cholesterol     <200 mg/dL 130   Triglycerides     <150 mg/dL 865 (H)   HDL Cholesterol     > OR = 50 mg/dL 38 (L)   Total CHOL/HDL Ratio     <5.0 (calc) 4.0   Sodium     135 - 146 mmol/L 140   Potassium     3.5 - 5.3 mmol/L 4.5   Chloride     98 - 110 mmol/L 105   CO2     20 - 32 mmol/L 26   Glucose     65 - 99 mg/dL 784 (H)   BUN     7 - 25 mg/dL 13   Creatinine     6.96 - 0.99 mg/dL 2.95 (H)   Calcium     8.6 - 10.2 mg/dL 9.6   Microalb, Ur     mg/dL 1.5   MICROALB/CREAT RATIO     <30 mg/g creat 11   Creatinine, Urine     20 - 275 mg/dL 284   LDL Cholesterol (Calc)     mg/dL (calc) --   Non-HDL Cholesterol (Calc)     <130 mg/dL (calc) 132   eGFR     > OR = 60 mL/min/1.20m2 66   BUN/Creatinine Ratio     6 - 22 (calc) 12   Albumin  MSPROF     3.6 - 5.1 g/dL 4.2   Globulin     1.9 - 3.7 g/dL (calc) 2.6   AG Ratio     1.0 - 2.5 (calc) 1.6   Alkaline phosphatase (APISO)     31 - 125 U/L 83   HDL is low and triglycerides are very high.  Also, glucose is high.  Creatinine is slightly above target, but stable. We need to check with the patient if she is taking the Lipitor 40 mg every day.  If not, I will advise her to start.  If she is, I would suggest to add Tricor.  Carlus Pavlov, MD PhD [facility]

## 2023-03-31 ENCOUNTER — Encounter: Payer: Self-pay | Admitting: Internal Medicine

## 2023-03-31 LAB — LIPID PANEL
Cholesterol: 151 mg/dL (ref <200)
HDL: 38 mg/dL — ABNORMAL LOW (ref >=50)
Non-HDL Cholesterol (Calc): 113 mg/dL (ref <130)
Total CHOL/HDL Ratio: 4 (calc) (ref <5.0)
Triglycerides: 449 mg/dL — ABNORMAL HIGH (ref <150)

## 2023-03-31 LAB — COMPLETE METABOLIC PANEL WITH GFR
AG Ratio: 1.6 (calc) (ref 1.0–2.5)
ALT: 29 U/L (ref 6–29)
AST: 22 U/L (ref 10–35)
Albumin: 4.2 g/dL (ref 3.6–5.1)
Alkaline phosphatase (APISO): 83 U/L (ref 31–125)
BUN/Creatinine Ratio: 12 (calc) (ref 6–22)
BUN: 13 mg/dL (ref 7–25)
CO2: 26 mmol/L (ref 20–32)
Calcium: 9.6 mg/dL (ref 8.6–10.2)
Chloride: 105 mmol/L (ref 98–110)
Creat: 1.06 mg/dL — ABNORMAL HIGH (ref 0.50–0.99)
Globulin: 2.6 g/dL (ref 1.9–3.7)
Glucose, Bld: 232 mg/dL — ABNORMAL HIGH (ref 65–99)
Potassium: 4.5 mmol/L (ref 3.5–5.3)
Sodium: 140 mmol/L (ref 135–146)
Total Bilirubin: 0.4 mg/dL (ref 0.2–1.2)
Total Protein: 6.8 g/dL (ref 6.1–8.1)
eGFR: 66 mL/min/{1.73_m2} (ref >=60)

## 2023-03-31 LAB — MICROALBUMIN / CREATININE URINE RATIO
Creatinine, Urine: 135 mg/dL (ref 20–275)
Microalb Creat Ratio: 11 mg/g{creat} (ref <30)
Microalb, Ur: 1.5 mg/dL

## 2023-04-10 ENCOUNTER — Encounter: Payer: Self-pay | Admitting: Internal Medicine

## 2023-04-18 ENCOUNTER — Encounter: Payer: Self-pay | Admitting: Internal Medicine

## 2023-04-18 MED ORDER — FREESTYLE LIBRE 2 SENSOR MISC: 1.0000 | 3 refills | Status: DC

## 2023-04-28 ENCOUNTER — Ambulatory Visit: Payer: Medicare Other

## 2023-05-03 ENCOUNTER — Telehealth: Payer: Self-pay | Admitting: Internal Medicine

## 2023-05-03 NOTE — Telephone Encounter (Signed)
 Copied from CRM [id number]. Topic: Appointments - Scheduling Inquiry for Clinic >> May 02, 2023  3:33 PM Roseanne Cones wrote: Reason for CRM: Patient is calling to schedule an appointment for B12 and Depo injections.  Contact center noted that all injections scheduling gets routed as a CRM. Please call patient to schedule appointment, their preference would be this Friday.  ---  Pt requesting an order for a depo shot, and stats she will schedule b12 and the depo together.

## 2023-05-03 NOTE — Telephone Encounter (Signed)
Called and left voicemail to get Pt scheduled.

## 2023-05-10 DIAGNOSIS — E1165 Type 2 diabetes mellitus with hyperglycemia: Secondary | ICD-10-CM | POA: Diagnosis not present

## 2023-05-12 ENCOUNTER — Other Ambulatory Visit: Payer: Self-pay | Admitting: Neurology

## 2023-05-12 ENCOUNTER — Other Ambulatory Visit: Payer: Self-pay | Admitting: Internal Medicine

## 2023-05-30 ENCOUNTER — Other Ambulatory Visit: Payer: Self-pay | Admitting: Neurology

## 2023-05-30 ENCOUNTER — Other Ambulatory Visit: Payer: Self-pay | Admitting: Internal Medicine

## 2023-06-07 ENCOUNTER — Other Ambulatory Visit: Payer: Self-pay | Admitting: Medical Genetics

## 2023-06-20 DIAGNOSIS — D485 Neoplasm of uncertain behavior of skin: Secondary | ICD-10-CM | POA: Diagnosis not present

## 2023-06-20 DIAGNOSIS — B078 Other viral warts: Secondary | ICD-10-CM | POA: Diagnosis not present

## 2023-06-20 DIAGNOSIS — B079 Viral wart, unspecified: Secondary | ICD-10-CM | POA: Diagnosis not present

## 2023-06-23 ENCOUNTER — Other Ambulatory Visit (HOSPITAL_COMMUNITY)
Admission: RE | Admit: 2023-06-23 | Discharge: 2023-06-23 | Disposition: A | Discharge status: Home / Self Care | Payer: Self-pay | Source: Ambulatory Visit | Attending: Medical Genetics | Admitting: Medical Genetics

## 2023-06-23 ENCOUNTER — Ambulatory Visit: Payer: Medicare Other | Admitting: Internal Medicine

## 2023-06-23 ENCOUNTER — Encounter: Payer: Self-pay | Admitting: Internal Medicine

## 2023-06-23 VITALS — BP 124/70 | HR 78 | Ht 63.0 in | Wt 203.8 lb

## 2023-06-23 DIAGNOSIS — E7800 Pure hypercholesterolemia, unspecified: Secondary | ICD-10-CM

## 2023-06-23 DIAGNOSIS — Z794 Long term (current) use of insulin: Secondary | ICD-10-CM | POA: Diagnosis not present

## 2023-06-23 DIAGNOSIS — E1165 Type 2 diabetes mellitus with hyperglycemia: Secondary | ICD-10-CM

## 2023-06-23 MED ORDER — FREESTYLE LIBRE 3 PLUS SENSOR MISC: 1.0000 | 3 refills | Status: DC

## 2023-06-23 NOTE — Patient Instructions (Addendum)
 Please continue: - Metforin ER 1000 mg with dinner - Lantus 35 units at bedtime - Humalog - 15 min before meals: - before coffee: 20 units - before lunch: 16-20 units - before dinner: 16-20 units (may need to add 2-4 units for larger meals) - Ozempic 1 mg weekly  Try to start fish oil 1000 mg daily.    Please return in 3-4 months.

## 2023-06-23 NOTE — Progress Notes (Signed)
 Patient ID: Courtney [name], female   DOB: 1975-09-04, 48 y.o.   MRN: [id number]  HPI: Courtney [name] is a 48 y.o.-year-old female, returning for follow-up for DM2, dx in 2016 (when she presented in hyperglycemic nonketotic hyperosmolar state), insulin-dependent since 2018, uncontrolled, without long-term complications. Pt. previously saw Dr. Everardo All, but last visit with me 3 months ago.  Interim history: No increased urination, blurry vision, nausea, chest pain.  Reviewed HbA1c: Lab Results  Component Value Date   HGBA1C 7.9 (A) 03/30/2023   HGBA1C 6.9 (A) 04/15/2022   HGBA1C 6.6 (A) 12/09/2021   HGBA1C 7.2 (A) 08/23/2021   HGBA1C 8.2 (H) 06/24/2021   HGBA1C 8.4 (A) 05/07/2021   HGBA1C 8.7 (A) 03/05/2021   HGBA1C 8.0 (A) 01/04/2021   HGBA1C 8.1 (H) 12/24/2020   HGBA1C 7.4 (A) 05/13/2020   At our first visit, she was on: - Rybelsus 14 mg at waking up >> Ozempic 0.5 mg weekly -tolerated well - Lantus 28 units at bedtime - Lispro 23-18-12 units before meals >> 15 min before meals She tried metformin but this caused nausea. She tried Invokana but this caused vaginitis. Per review of Dr. George Hugh note, she declined weight loss surgery.   Also, per his note, she declined an insulin pump.  Now on: - Metformin ER 1000 mg in am >> moved to dinnertime - Lantus 28 >> 34-36 units at bedtime - Humalog - 15 min before meals: - before coffee: 20 units - before lunch: 16-20 units - before dinner: 14-18 >> 16-20 units  - Ozempic 1 mg weekly   Pt checks her sugars >4x a day with the Libre CGM ([place]):  Prev.  - am:270-280 - 2h after b'fast: n/c - lunch: 170s - 2h after lunch: occas. 300 - dinner: 177-200 - 2h after dinner: <300 - bedtime: n/c  Before she ran out of sensors:   Previously:  Lowest sugar was 65 >> 100 >> 100 >> 70; she has hypoglycemia awareness at 70.  Highest sugar was 233 >> 240 >> 300s >> 200s.  - no CKD, last BUN/creatinine:  Lab Results  Component Value  Date   BUN 13 03/30/2023   BUN 14 01/07/2022   CREATININE 1.06 (H) 03/30/2023   CREATININE 1.06 01/07/2022   Lab Results  Component Value Date   MICRALBCREAT 11 03/30/2023   MICRALBCREAT 0.7 01/07/2022   MICRALBCREAT 1.7 06/24/2021   MICRALBCREAT 3.0 06/12/2019   MICRALBCREAT 5.9 01/03/2018   MICRALBCREAT 1.9 02/17/2016  On Cozaar 100 mg daily.  -+ HL; last set of lipids: LDL was not calculated Lab Results  Component Value Date   CHOL 151 03/30/2023   HDL 38 (L) 03/30/2023   LDLCALC  03/30/2023     Comment:     . LDL cholesterol not calculated. Triglyceride levels greater than 400 mg/dL invalidate calculated LDL results. . Reference range: <100 . Desirable range <100 mg/dL for primary prevention;   <70 mg/dL for patients with CHD or diabetic patients  with > or = 2 CHD risk factors. Marland Kitchen LDL-C is now calculated using the Martin-Hopkins  calculation, which is a validated novel method providing  better accuracy than the Friedewald equation in the  estimation of LDL-C.  Horald Pollen et al. Lenox Ahr. 4010;272(53): 2061-2068  (http://education.[facility].com/faq/FAQ164)    LDLDIRECT 53.0 06/24/2021   TRIG 449 (H) 03/30/2023   CHOLHDL 4.0 03/30/2023  On atorvastatin 40 mg daily.  She cannot take fenofibrate due to rash.  - last eye exam was in 02/2022. No  DR reportedly.   - no numbness and tingling in her feet.  Last foot exam 01/04/2021.  She is on gabapentin 300 mg tablets: 2-1-2.  Last foot exam 03/30/2023.  She also has a history of HTN, MS, fibromyalgia, chronic fatigue, OSA-on CPAP, GERD, IBS, vitamin D deficiency, B12 anemia. 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  ROS: + see HPI  Past Medical History:  Diagnosis Date   ALLERGIC RHINITIS 03/13/2007   Allergy    Anemia, B12 deficiency    Anxiety    C. difficile colitis    CARPAL TUNNEL SYNDROME, BILATERAL 01/13/2010   Cervical lesion 07/13/2011   Spine lesion - indeterminate,  for f/u MRI may 2013 per neurology   Chronic fatigue    CTS (carpal tunnel syndrome)    Diabetes mellitus without complication (HCC)    ESSENTIAL HYPERTENSION 03/13/2007   Fibromyalgia    GERD (gastroesophageal reflux disease)    Heart murmur    Hx of adenomatous polyp of colon 06/09/2009   Hyperlipidemia    HYPERLIPIDEMIA 03/13/2007   Irritable bowel syndrome 05/24/2010   Multiple sclerosis (HCC)    OBSTRUCTIVE SLEEP APNEA 12/08/2008   Sleep apnea    uses c pap   VITAMIN D DEFICIENCY 01/13/2010   Past Surgical History:  Procedure Laterality Date   APPENDECTOMY  07/23/12   CARPAL TUNNEL RELEASE  01/2010   bilateral   COLONOSCOPY W/ BIOPSIES AND POLYPECTOMY  05/2009   2 tubular adenomas   LAPAROSCOPIC APPENDECTOMY N/A 07/23/2012   Procedure: APPENDECTOMY LAPAROSCOPIC;  Surgeon: Atilano Ina, MD;  Location: [facility] OR;  Service: General;  Laterality: N/A;   Social History   Socioeconomic History   Marital status: Single    Spouse name: Not on file   Number of children: 2   Years of education: Not on file   Highest education level: Not on file  Occupational History   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    Employer: UNEMPLOYED  Tobacco Use   Smoking status: Never   Smokeless tobacco: Never  Vaping Use   Vaping status: Never Used  Substance and Sexual Activity   Alcohol use: No    Alcohol/week: 0.0 standard drinks of alcohol   Drug use: No   Sexual activity: Not on file  Other Topics Concern   Not on file  Social History Narrative   Daily Caffeine   Right handed   Two story home   Social Drivers of Health   Financial Resource Strain: Not on file  Food Insecurity: Not on file  Transportation Needs: Not on file  Physical Activity: Not on file  Stress: Not on file  Social Connections: Not on file  Intimate Partner Violence: Not on file   Current Outpatient Medications on File Prior to Visit  Medication Sig Dispense Refill   amLODipine (NORVASC) 10 MG tablet TAKE 1 TABLET  BY MOUTH DAILY 100 tablet 2   atorvastatin (LIPITOR) 40 MG tablet TAKE 1 TABLET BY MOUTH DAILY 100 tablet 2   baclofen (LIORESAL) 10 MG tablet TAKE 1 TABLET BY MOUTH 3 TIMES  DAILY 90 tablet 1   cetirizine (ZYRTEC) 10 MG tablet Take 10 mg by mouth daily.       Cholecalciferol (VITAMIN D) 2000 UNITS CAPS Take 2 capsules by mouth every morning. Reported on 06/05/2015     Continuous Glucose Sensor (FREESTYLE LIBRE 2 SENSOR) MISC 1 Device by Does not apply route every 14 (fourteen) days. 6 each  3   cyanocobalamin (,VITAMIN B-12,) 1000 MCG/ML injection Inject into the muscle every 30 (thirty) days.     famotidine (PEPCID) 20 MG tablet TAKE 1 TABLET BY MOUTH TWICE  DAILY 200 tablet 2   furosemide (LASIX) 40 MG tablet TAKE 1 TABLET BY MOUTH DAILY 100 tablet 2   gabapentin (NEURONTIN) 300 MG capsule TAKE 2 CAPSULES BY MOUTH IN THE  MORNING 1 CAPSULE BY MOUTH IN  THE AFTERNOON AND 2 CAPSULES BY  MOUTH IN THE EVENING 500 capsule 2   glucose blood (FREESTYLE TEST STRIPS) test strip 1 each by Other route 2 (two) times daily. And lancets, 2 times daily. 180 each 3   glucose monitoring kit (FREESTYLE) monitoring kit 1 each by Does not apply route as needed for other. Dispense any model that is covered- dispense testing supplies for Q AC/ HS accuchecks- 1 month supply with one refil. 1 each 1   insulin glargine (LANTUS SOLOSTAR) 100 UNIT/ML Solostar Pen Inject 34-36 Units into the skin at bedtime. 30 mL 3   insulin lispro (HUMALOG KWIKPEN) 100 UNIT/ML KwikPen Inject under skin up to 60 units a day as advised 45 mL 3   Insulin Pen Needle 32G X 4 MM MISC Use 4x a day 400 each 3   loperamide (IMODIUM) 2 MG capsule Take 2 mg by mouth as needed for diarrhea or loose stools.     losartan (COZAAR) 100 MG tablet TAKE 1 TABLET BY MOUTH DAILY 100 tablet 2   medroxyPROGESTERone (DEPO-PROVERA) 150 MG/ML injection Inject 150 mg into the muscle every 3 (three) months.       meloxicam (MOBIC) 15 MG tablet TAKE 1 TABLET BY MOUTH  DAILY 100 tablet 2   metFORMIN (GLUCOPHAGE-XR) 500 MG 24 hr tablet Take 2 tablets (1,000 mg total) by mouth daily. 180 tablet 3   metoprolol tartrate (LOPRESSOR) 100 MG tablet TAKE 2 TABLETS BY MOUTH DAILY 200 tablet 2   nortriptyline (PAMELOR) 50 MG capsule Take 2 capsules (100 mg total) by mouth at bedtime. 180 capsule 3   omeprazole-sodium bicarbonate (ZEGERID) 40-1100 MG per capsule Take 1 capsule by mouth daily.       potassium chloride (KLOR-CON) 10 MEQ tablet TAKE 2 TABLETS BY MOUTH DAILY 200 tablet 2   predniSONE (STERAPRED UNI-PAK 21 TAB) 10 MG (21) TBPK tablet take 60mg  day 1, then 50mg  day 2, then 40mg  day 3, then 30mg  day 4, then 20mg  day 5, then 10mg  day 6, then STOP 21 tablet 0   Semaglutide, 1 MG/DOSE, 4 MG/3ML SOPN Inject 1 mg as directed once a week. 9 mL 3   Teriflunomide 14 MG TABS Take 1 tablet daily 30 tablet 5   traMADol (ULTRAM) 50 MG tablet Take 1 tablet (50 mg total) by mouth every 6 (six) hours as needed. 20 tablet 1   No current facility-administered medications on file prior to visit.   Allergies  Allergen Reactions   Ciprofloxacin In D5w Rash   Penicillins     REACTION: rash   Tricor [Fenofibrate] Rash   Family History  Problem Relation Age of Onset   Migraines Mother    Hypertension Father    COPD Father    Diabetes Father    Prostate cancer Maternal Uncle        prostate cancer   Lung cancer Maternal Grandmother    Stroke Maternal Grandfather    Heart attack Maternal Grandfather    Diabetes Other    Hypertension Other    Asthma Daughter  Colon cancer Neg Hx    Esophageal cancer Neg Hx    Rectal cancer Neg Hx    Stomach cancer Neg Hx    PE: There were no vitals taken for this visit. Wt Readings from Last 3 Encounters:  03/30/23 202 lb 6.4 oz (91.8 kg)  08/11/22 195 lb (88.5 kg)  04/15/22 196 lb 9.6 oz (89.2 kg)   Constitutional: overweight, in NAD Eyes: no exophthalmos ENT: no thyromegaly, no cervical lymphadenopathy Cardiovascular:  RRR, No MRG, + B LE edema (chronic) Respiratory: CTA B Musculoskeletal: no deformities Skin: no rashes Neurological: + Mild tremor with outstretched hands (chronic)  ASSESSMENT: 1. DM2, insulin-dependent, uncontrolled, without long-term complications  2. HL  PLAN:  1. Patient with longstanding, uncontrolled, type 2 diabetes, returning at last visit after long absence of a year.  Sugars were higher overnight but improving during the day and they were quite variable after dinner.  Unfortunately, she was off the CGM and sugars were higher in the months prior to the appointment.  She was taking metformin in the morning and I advised her to move it at night.  We also increased her insulin before dinner.  Since sugars were high overnight we increased her Lantus at bedtime.  We sent another prescription for her CGM to her supplier.  I strongly advised her to try to restart this.  She was able to do so since last visit. CGM interpretation: -At today's visit, we reviewed her CGM downloads: It appears that 86% of values are in target range (goal >70%), while 14% are higher than 180 (goal <25%), and 0% are lower than 70 (goal <4%).  The calculated average blood sugar is 146.  The projected HbA1c for the next 3 months (GMI) is 6.8%. -Reviewing the CGM trends, sugars are fluctuating mostly within the target range.She has occasional high sugars after dinner, but not consistently. We discussed about possibly using a slightly higher dose of Humalog before larger dinners, but otherwise I did not suggest a change in regimen.  She also has a slight increase in blood sugars after coffee despite taking 20 units of Humalog.  She does add creamer to her coffee.  At next visit, if the sugars are still elevated, we may need to use a slightly higher dose of Humalog before coffee, also. -I suggested to: Patient Instructions  Please continue: - Metforin ER 1000 mg with dinner - Lantus 35 units at bedtime - Humalog - 15 min  before meals: - before coffee: 20 units - before lunch: 16-20 units - before dinner: 16-20 units (may need to add 2-4 units for larger meals) - Ozempic 1 mg weekly  Try to start fish oil 1000 mg daily.    Please return in 3-4 months.  - we checked her HbA1c: 7.0% (improved) - advised to check sugars at different times of the day - 4x a day, rotating check times - advised for yearly eye exams >> she is not UTD -advised to schedule - return to clinic in 3-4 months  2. HL -Latest lipid panel was reviewed from last year:  triglycerides slightly high, HDL slightly low, LDL was not calculated: Lab Results  Component Value Date   CHOL 151 03/30/2023   HDL 38 (L) 03/30/2023   LDLCALC  03/30/2023     Comment:     . LDL cholesterol not calculated. Triglyceride levels greater than 400 mg/dL invalidate calculated LDL results. . Reference range: <100 . Desirable range <100 mg/dL for primary prevention;   <  70 mg/dL for patients with CHD or diabetic patients  with > or = 2 CHD risk factors. Marland Kitchen LDL-C is now calculated using the Martin-Hopkins  calculation, which is a validated novel method providing  better accuracy than the Friedewald equation in the  estimation of LDL-C.  Horald Pollen et al. Lenox Ahr. 1610;960(45): 2061-2068  (http://education.[facility].com/faq/FAQ164)    LDLDIRECT 53.0 06/24/2021   TRIG 449 (H) 03/30/2023   CHOLHDL 4.0 03/30/2023  -She is on Lipitor 40 mg daily without side effects -At today's visit we discussed about using either fenofibrate or fish oil.  However, she developed a rash on fenofibrate in the past so at today's visit I suggested to start fish oil 1000 mg daily.  At next visit, plan to repeat her lipids and may need to increase this to twice a day.  Carlus Pavlov, MD PhD [facility] Endocrinology

## 2023-07-07 LAB — GENECONNECT MOLECULAR SCREEN: Genetic Analysis Overall Interpretation: NEGATIVE

## 2023-07-17 ENCOUNTER — Encounter: Payer: Self-pay | Admitting: Neurology

## 2023-07-28 ENCOUNTER — Encounter: Payer: Self-pay | Admitting: Internal Medicine

## 2023-07-28 ENCOUNTER — Ambulatory Visit: Admitting: Internal Medicine

## 2023-07-28 VITALS — BP 130/76 | HR 83 | Temp 98.7°F | Ht 63.0 in | Wt 204.0 lb

## 2023-07-28 DIAGNOSIS — Z23 Encounter for immunization: Secondary | ICD-10-CM | POA: Diagnosis not present

## 2023-07-28 DIAGNOSIS — I10 Essential (primary) hypertension: Secondary | ICD-10-CM | POA: Diagnosis not present

## 2023-07-28 DIAGNOSIS — Z0000 Encounter for general adult medical examination without abnormal findings: Secondary | ICD-10-CM

## 2023-07-28 DIAGNOSIS — E7800 Pure hypercholesterolemia, unspecified: Secondary | ICD-10-CM | POA: Diagnosis not present

## 2023-07-28 DIAGNOSIS — Z794 Long term (current) use of insulin: Secondary | ICD-10-CM

## 2023-07-28 DIAGNOSIS — Z309 Encounter for contraceptive management, unspecified: Secondary | ICD-10-CM | POA: Diagnosis not present

## 2023-07-28 DIAGNOSIS — E559 Vitamin D deficiency, unspecified: Secondary | ICD-10-CM | POA: Diagnosis not present

## 2023-07-28 DIAGNOSIS — E538 Deficiency of other specified B group vitamins: Secondary | ICD-10-CM

## 2023-07-28 DIAGNOSIS — E1165 Type 2 diabetes mellitus with hyperglycemia: Secondary | ICD-10-CM | POA: Diagnosis not present

## 2023-07-28 DIAGNOSIS — Z0001 Encounter for general adult medical examination with abnormal findings: Secondary | ICD-10-CM

## 2023-07-28 LAB — BASIC METABOLIC PANEL WITH GFR
BUN: 13 mg/dL (ref 6–23)
CO2: 29 meq/L (ref 19–32)
Calcium: 9.9 mg/dL (ref 8.4–10.5)
Chloride: 105 meq/L (ref 96–112)
Creatinine, Ser: 0.97 mg/dL (ref 0.40–1.20)
GFR: 69.73 mL/min (ref >60.00)
Glucose, Bld: 86 mg/dL (ref 70–99)
Potassium: 4.5 meq/L (ref 3.5–5.1)
Sodium: 141 meq/L (ref 135–145)

## 2023-07-28 LAB — MICROALBUMIN / CREATININE URINE RATIO
Creatinine,U: 253.7 mg/dL
Microalb Creat Ratio: 8.7 mg/g (ref 0.0–30.0)
Microalb, Ur: 2.2 mg/dL — ABNORMAL HIGH (ref 0.0–1.9)

## 2023-07-28 LAB — CBC WITH DIFFERENTIAL/PLATELET
Basophils Absolute: 0.1 10*3/uL (ref 0.0–0.1)
Basophils Relative: 0.7 % (ref 0.0–3.0)
Eosinophils Absolute: 0.6 10*3/uL (ref 0.0–0.7)
Eosinophils Relative: 7.4 % — ABNORMAL HIGH (ref 0.0–5.0)
HCT: 35.4 % — ABNORMAL LOW (ref 36.0–46.0)
Hemoglobin: 11.8 g/dL — ABNORMAL LOW (ref 12.0–15.0)
Lymphocytes Relative: 18.4 % (ref 12.0–46.0)
Lymphs Abs: 1.4 10*3/uL (ref 0.7–4.0)
MCHC: 33.4 g/dL (ref 30.0–36.0)
MCV: 84.9 fl (ref 78.0–100.0)
Monocytes Absolute: 0.4 10*3/uL (ref 0.1–1.0)
Monocytes Relative: 4.6 % (ref 3.0–12.0)
Neutro Abs: 5.4 10*3/uL (ref 1.4–7.7)
Neutrophils Relative %: 68.9 % (ref 43.0–77.0)
Platelets: 309 10*3/uL (ref 150.0–400.0)
RBC: 4.17 Mil/uL (ref 3.87–5.11)
RDW: 15.3 % (ref 11.5–15.5)
WBC: 7.8 10*3/uL (ref 4.0–10.5)

## 2023-07-28 LAB — HEPATIC FUNCTION PANEL
ALT: 17 U/L (ref 0–35)
AST: 15 U/L (ref 0–37)
Albumin: 4.4 g/dL (ref 3.5–5.2)
Alkaline Phosphatase: 70 U/L (ref 39–117)
Bilirubin, Direct: 0 mg/dL (ref 0.0–0.3)
Total Bilirubin: 0.4 mg/dL (ref 0.2–1.2)
Total Protein: 7.1 g/dL (ref 6.0–8.3)

## 2023-07-28 LAB — URINALYSIS, ROUTINE W REFLEX MICROSCOPIC
Hgb urine dipstick: NEGATIVE
Leukocytes,Ua: NEGATIVE
Nitrite: NEGATIVE
Specific Gravity, Urine: >=1.03 — AB (ref 1.000–1.030)
Urine Glucose: NEGATIVE
Urobilinogen, UA: 0.2 (ref 0.0–1.0)
pH: 6 (ref 5.0–8.0)

## 2023-07-28 LAB — LIPID PANEL
Cholesterol: 116 mg/dL (ref 0–200)
HDL: 44.9 mg/dL (ref >39.00)
LDL Cholesterol: 41 mg/dL (ref 0–99)
NonHDL: 70.9
Total CHOL/HDL Ratio: 3
Triglycerides: 149 mg/dL (ref 0.0–149.0)
VLDL: 29.8 mg/dL (ref 0.0–40.0)

## 2023-07-28 LAB — VITAMIN D 25 HYDROXY (VIT D DEFICIENCY, FRACTURES): VITD: 55.47 ng/mL (ref 30.00–100.00)

## 2023-07-28 LAB — TSH: TSH: 1.55 u[IU]/mL (ref 0.35–5.50)

## 2023-07-28 LAB — HEMOGLOBIN A1C: Hgb A1c MFr Bld: 6.3 % (ref 4.6–6.5)

## 2023-07-28 LAB — VITAMIN B12: Vitamin B-12: >1537 pg/mL — ABNORMAL HIGH (ref 211–911)

## 2023-07-28 MED ORDER — CYANOCOBALAMIN 1000 MCG/ML IJ SOLN
1000.0000 ug | Freq: Once | INTRAMUSCULAR | Status: AC
Start: 2023-07-28 — End: 2023-07-28
  Administered 2023-07-28: 1000 ug via INTRAMUSCULAR

## 2023-07-28 MED ORDER — MEDROXYPROGESTERONE ACETATE 150 MG/ML IM SUSP
150.0000 mg | Freq: Once | INTRAMUSCULAR | Status: AC
Start: 2023-07-28 — End: 2023-07-28
  Administered 2023-07-28: 150 mg via INTRAMUSCULAR

## 2023-07-28 MED ORDER — SEMAGLUTIDE (2 MG/DOSE) 8 MG/3ML ~~LOC~~ SOPN: 2.0000 mg | PEN_INJECTOR | SUBCUTANEOUS | 3 refills | Status: AC

## 2023-07-28 NOTE — Patient Instructions (Signed)
 You had the Prevnar 20 pneumonia shot today  Please remember to see your GYN, eye doctor, and endocrinology soon  Ok to increase the ozempic to 2 mg weekly since you have not had any wt loss  Please continue all other medications as before, and refills have been done if requested.  Please have the pharmacy call with any other refills you may need.  Please continue your efforts at being more active, low cholesterol diet, and weight control.  You are otherwise up to date with prevention measures today.  Please keep your appointments with your specialists as you may have planned  Please go to the LAB at the blood drawing area for the tests to be done  You will be contacted by phone if any changes need to be made immediately.  Otherwise, you will receive a letter about your results with an explanation, but please check with MyChart first.  Please make an Appointment to return in 6 months, or sooner if needed

## 2023-07-28 NOTE — Progress Notes (Signed)
 Patient ID: Courtney [name], female   DOB: 1975-10-05, 48 y.o.   MRN: [id number]         Chief Complaint:: wellness exam and dm, low b12, hld, htn       HPI:  Courtney [name] is a 48 y.o. female here for wellness exam; plans to see Gyn and optho and endo soon; due for prevnar 20, o/w up to date                        Also has been caring for father now improved. See neurology regular basis. Pt denies chest pain, increased sob or doe, wheezing, orthopnea, PND, increased LE swelling, palpitations, dizziness or syncope.   Pt denies polydipsia, polyuria, or new focal neuro s/s.    Pt denies fever, wt loss, night sweats, loss of appetite, or other constitutional symptoms    Wt Readings from Last 3 Encounters:  07/28/23 204 lb (92.5 kg)  06/23/23 203 lb 12.8 oz (92.4 kg)  03/30/23 202 lb 6.4 oz (91.8 kg)   BP Readings from Last 3 Encounters:  07/28/23 130/76  06/23/23 124/70  03/30/23 138/70   Immunization History  Administered Date(s) Administered   Influenza Split 01/14/2011, 01/09/2012   Influenza Whole 03/13/2007, 02/07/2008, 02/27/2009, 01/13/2010   Influenza, Seasonal, Injecte, Preservative Fre 03/10/2023   Influenza,inj,Quad PF,6+ Mos 01/09/2013, 02/07/2014, 02/13/2015, 02/17/2016, 01/13/2017, 01/03/2018, 01/17/2019, 02/14/2020, 12/24/2020, 01/07/2022   PFIZER Comirnaty(Gray Top)Covid-19 Tri-Sucrose Vaccine 04/28/2020   PFIZER(Purple Top)SARS-COV-2 Vaccination 07/12/2019, 08/05/2019   PNEUMOCOCCAL CONJUGATE-20 07/28/2023   Pneumococcal Polysaccharide-23 02/13/2015   Td 04/18/2004   Tdap 04/22/2014   Health Maintenance Due  Topic Date Due   Medicare Annual Wellness (AWV)  Never done   Cervical Cancer Screening (HPV/Pap Cotest)  04/18/2014   OPHTHALMOLOGY EXAM  01/15/2023      Past Medical History:  Diagnosis Date   ALLERGIC RHINITIS 03/13/2007   Allergy    Anemia, B12 deficiency    Anxiety    C. difficile colitis    CARPAL TUNNEL SYNDROME, BILATERAL 01/13/2010   Cervical  lesion 07/13/2011   Spine lesion - indeterminate, for f/u MRI may 2013 per neurology   Chronic fatigue    CTS (carpal tunnel syndrome)    Diabetes mellitus without complication (HCC)    ESSENTIAL HYPERTENSION 03/13/2007   Fibromyalgia    GERD (gastroesophageal reflux disease)    Heart murmur    Hx of adenomatous polyp of colon 06/09/2009   Hyperlipidemia    HYPERLIPIDEMIA 03/13/2007   Irritable bowel syndrome 05/24/2010   Multiple sclerosis (HCC)    OBSTRUCTIVE SLEEP APNEA 12/08/2008   Sleep apnea    uses c pap   VITAMIN D DEFICIENCY 01/13/2010   Past Surgical History:  Procedure Laterality Date   APPENDECTOMY  07/23/12   CARPAL TUNNEL RELEASE  01/2010   bilateral   COLONOSCOPY W/ BIOPSIES AND POLYPECTOMY  05/2009   2 tubular adenomas   LAPAROSCOPIC APPENDECTOMY N/A 07/23/2012   Procedure: APPENDECTOMY LAPAROSCOPIC;  Surgeon: Fran Imus, MD;  Location: [facility] OR;  Service: General;  Laterality: N/A;    reports that she has never smoked. She has never used smokeless tobacco. She reports that she does not drink alcohol and does not use drugs. family history includes Asthma in her daughter; COPD in her father; Diabetes in her father and another family member; Heart attack in her maternal grandfather; Hypertension in her father and another family member; Lung cancer in her maternal grandmother; Migraines in her  mother; Prostate cancer in her maternal uncle; Stroke in her maternal grandfather. Allergies  Allergen Reactions   Ciprofloxacin In D5w Rash   Penicillins     REACTION: rash   Tricor [Fenofibrate] Rash   Current Outpatient Medications on File Prior to Visit  Medication Sig Dispense Refill   amLODipine (NORVASC) 10 MG tablet TAKE 1 TABLET BY MOUTH DAILY 100 tablet 2   atorvastatin (LIPITOR) 40 MG tablet TAKE 1 TABLET BY MOUTH DAILY 100 tablet 2   baclofen (LIORESAL) 10 MG tablet TAKE 1 TABLET BY MOUTH 3 TIMES  DAILY 90 tablet 1   cetirizine (ZYRTEC) 10 MG tablet Take 10 mg by mouth  daily.       Cholecalciferol (VITAMIN D) 2000 UNITS CAPS Take 2 capsules by mouth every morning. Reported on 06/05/2015     Continuous Glucose Sensor (FREESTYLE LIBRE 2 SENSOR) MISC 1 Device by Does not apply route every 14 (fourteen) days. 6 each 3   Continuous Glucose Sensor (FREESTYLE LIBRE 3 PLUS SENSOR) MISC 1 each by Does not apply route every 14 (fourteen) days. 6 each 3   cyanocobalamin (,VITAMIN B-12,) 1000 MCG/ML injection Inject into the muscle every 30 (thirty) days.     famotidine (PEPCID) 20 MG tablet TAKE 1 TABLET BY MOUTH TWICE  DAILY 200 tablet 2   furosemide (LASIX) 40 MG tablet TAKE 1 TABLET BY MOUTH DAILY 100 tablet 2   gabapentin (NEURONTIN) 300 MG capsule TAKE 2 CAPSULES BY MOUTH IN THE  MORNING 1 CAPSULE BY MOUTH IN  THE AFTERNOON AND 2 CAPSULES BY  MOUTH IN THE EVENING 500 capsule 2   glucose blood (FREESTYLE TEST STRIPS) test strip 1 each by Other route 2 (two) times daily. And lancets, 2 times daily. 180 each 3   glucose monitoring kit (FREESTYLE) monitoring kit 1 each by Does not apply route as needed for other. Dispense any model that is covered- dispense testing supplies for Q AC/ HS accuchecks- 1 month supply with one refil. 1 each 1   insulin glargine (LANTUS SOLOSTAR) 100 UNIT/ML Solostar Pen Inject 34-36 Units into the skin at bedtime. 30 mL 3   insulin lispro (HUMALOG KWIKPEN) 100 UNIT/ML KwikPen Inject under skin up to 60 units a day as advised 45 mL 3   Insulin Pen Needle 32G X 4 MM MISC Use 4x a day 400 each 3   loperamide (IMODIUM) 2 MG capsule Take 2 mg by mouth as needed for diarrhea or loose stools.     losartan (COZAAR) 100 MG tablet TAKE 1 TABLET BY MOUTH DAILY 100 tablet 2   medroxyPROGESTERone (DEPO-PROVERA) 150 MG/ML injection Inject 150 mg into the muscle every 3 (three) months.       meloxicam (MOBIC) 15 MG tablet TAKE 1 TABLET BY MOUTH DAILY 100 tablet 2   metFORMIN (GLUCOPHAGE-XR) 500 MG 24 hr tablet Take 2 tablets (1,000 mg total) by mouth daily. 180  tablet 3   metoprolol tartrate (LOPRESSOR) 100 MG tablet TAKE 2 TABLETS BY MOUTH DAILY 200 tablet 2   nortriptyline (PAMELOR) 50 MG capsule Take 2 capsules (100 mg total) by mouth at bedtime. 180 capsule 3   omeprazole-sodium bicarbonate (ZEGERID) 40-1100 MG per capsule Take 1 capsule by mouth daily.       potassium chloride (KLOR-CON) 10 MEQ tablet TAKE 2 TABLETS BY MOUTH DAILY 200 tablet 2   predniSONE (STERAPRED UNI-PAK 21 TAB) 10 MG (21) TBPK tablet take 60mg  day 1, then 50mg  day 2, then 40mg  day 3, then  30mg  day 4, then 20mg  day 5, then 10mg  day 6, then STOP 21 tablet 0   Teriflunomide 14 MG TABS Take 1 tablet daily 30 tablet 5   traMADol (ULTRAM) 50 MG tablet Take 1 tablet (50 mg total) by mouth every 6 (six) hours as needed. 20 tablet 1   No current facility-administered medications on file prior to visit.        ROS:  All others reviewed and negative.  Objective        PE:  BP 130/76 (BP Location: Right Arm, Patient Position: Sitting, Cuff Size: Normal)   Pulse 83   Temp 98.7 F (37.1 C) (Oral)   Ht 5\' 3"  (1.6 m)   Wt 204 lb (92.5 kg)   SpO2 99%   BMI 36.14 kg/m                 Constitutional: Pt appears in NAD               HENT: Head: NCAT.                Right Ear: External ear normal.                 Left Ear: External ear normal.                Eyes: . Pupils are equal, round, and reactive to light. Conjunctivae and EOM are normal               Nose: without d/c or deformity               Neck: Neck supple. Gross normal ROM               Cardiovascular: Normal rate and regular rhythm.                 Pulmonary/Chest: Effort normal and breath sounds without rales or wheezing.                Abd:  Soft, NT, ND, + BS, no organomegaly               Neurological: Pt is alert. At baseline orientation, motor grossly intact               Skin: Skin is warm. No rashes, no other new lesions, LE edema - none               Psychiatric: Pt behavior is normal without agitation    Micro: none  Cardiac tracings I have personally interpreted today:  none  Pertinent Radiological findings (summarize): none   Lab Results  Component Value Date   WBC 7.8 07/28/2023   HGB 11.8 (L) 07/28/2023   HCT 35.4 (L) 07/28/2023   PLT 309.0 07/28/2023   GLUCOSE 86 07/28/2023   CHOL 116 07/28/2023   TRIG 149.0 07/28/2023   HDL 44.90 07/28/2023   LDLDIRECT 53.0 06/24/2021   LDLCALC 41 07/28/2023   ALT 17 07/28/2023   AST 15 07/28/2023   NA 141 07/28/2023   K 4.5 07/28/2023   CL 105 07/28/2023   CREATININE 0.97 07/28/2023   BUN 13 07/28/2023   CO2 29 07/28/2023   TSH 1.55 07/28/2023   INR 1.0 12/23/2010   HGBA1C 6.3 07/28/2023   MICROALBUR 2.2 (H) 07/28/2023   Assessment/Plan:  Courtney [name] is a 48 y.o. White or Caucasian [1] female with  has a past medical history of ALLERGIC RHINITIS (03/13/2007), Allergy, Anemia, B12 deficiency,  Anxiety, C. difficile colitis, CARPAL TUNNEL SYNDROME, BILATERAL (01/13/2010), Cervical lesion (07/13/2011), Chronic fatigue, CTS (carpal tunnel syndrome), Diabetes mellitus without complication (HCC), ESSENTIAL HYPERTENSION (03/13/2007), Fibromyalgia, GERD (gastroesophageal reflux disease), Heart murmur, adenomatous polyp of colon (06/09/2009), Hyperlipidemia, HYPERLIPIDEMIA (03/13/2007), Irritable bowel syndrome (05/24/2010), Multiple sclerosis (HCC), OBSTRUCTIVE SLEEP APNEA (12/08/2008), Sleep apnea, and VITAMIN D DEFICIENCY (01/13/2010).  Encounter for well adult exam with abnormal findings Age and sex appropriate education and counseling updated with regular exercise and diet Referrals for preventative services - pt to call for gyn, optho, endo soone Immunizations addressed - for prevnar 20 Smoking counseling  - none needed Evidence for depression or other mood disorder - none significant Most recent labs reviewed. I have personally reviewed and have noted: 1) the patient's medical and social history 2) The patient's current medications and  supplements 3) The patient's height, weight, and BMI have been recorded in the chart   Vitamin D deficiency Last vitamin D Lab Results  Component Value Date   VD25OH 55.47 07/28/2023   Stable, cont oral replacement   Essential hypertension BP Readings from Last 3 Encounters:  07/28/23 130/76  06/23/23 124/70  03/30/23 138/70   Stable, pt to continue medical treatment norvasc 10 every day, losartan 100 qd   Vitamin B 12 deficiency Lab Results  Component Value Date   VITAMINB12 >1537 (H) 07/28/2023   Stable, cont oral replacement - b12 1000 mcg qd   Type 2 diabetes mellitus with hyperglycemia, with long-term current use of insulin (HCC) Lab Results  Component Value Date   HGBA1C 6.3 07/28/2023   With uncontrolled obesity pt to continue current medical treatment antus 34 u every day, humalog up to 60 u every day but increased ozempic 2 mg weekly, f/u endo as planned   Pure hypercholesterolemia Lab Results  Component Value Date   LDLCALC 41 07/28/2023   Stable, pt to continue current statin lipitor 40 mg qd   Contraceptive management For medroxyprogesteron  Followup: Return in about 6 months (around 01/27/2024).  Rosalia Colonel, MD 07/29/2023 9:47 PM [facility]

## 2023-07-28 NOTE — Progress Notes (Signed)
 The test results show that your current treatment is OK, as the tests are stable.  Please continue the same plan.  There is no other need for change of treatment or further evaluation based on these results, at this time.  thanks

## 2023-07-29 ENCOUNTER — Encounter: Payer: Self-pay | Admitting: Internal Medicine

## 2023-07-29 NOTE — Assessment & Plan Note (Signed)
 Age and sex appropriate education and counseling updated with regular exercise and diet Referrals for preventative services - pt to call for gyn, optho, endo soone Immunizations addressed - for prevnar 20 Smoking counseling  - none needed Evidence for depression or other mood disorder - none significant Most recent labs reviewed. I have personally reviewed and have noted: 1) the patient's medical and social history 2) The patient's current medications and supplements 3) The patient's height, weight, and BMI have been recorded in the chart

## 2023-07-29 NOTE — Assessment & Plan Note (Signed)
 Lab Results  Component Value Date   VITAMINB12 >1537 (H) 07/28/2023   Stable, cont oral replacement - b12 1000 mcg qd

## 2023-07-29 NOTE — Assessment & Plan Note (Signed)
 Lab Results  Component Value Date   LDLCALC 41 07/28/2023   Stable, pt to continue current statin lipitor 40 mg qd

## 2023-07-29 NOTE — Assessment & Plan Note (Signed)
 BP Readings from Last 3 Encounters:  07/28/23 130/76  06/23/23 124/70  03/30/23 138/70   Stable, pt to continue medical treatment norvasc 10 every day, losartan 100 qd

## 2023-07-29 NOTE — Assessment & Plan Note (Signed)
 Last vitamin D Lab Results  Component Value Date   VD25OH 55.47 07/28/2023   Stable, cont oral replacement

## 2023-07-29 NOTE — Assessment & Plan Note (Addendum)
 Lab Results  Component Value Date   HGBA1C 6.3 07/28/2023   With uncontrolled obesity pt to continue current medical treatment antus 34 u every day, humalog up to 60 u every day but increased ozempic 2 mg weekly, f/u endo as planned

## 2023-07-29 NOTE — Assessment & Plan Note (Signed)
 For medroxyprogesteron

## 2023-08-07 ENCOUNTER — Other Ambulatory Visit: Payer: Self-pay

## 2023-08-07 ENCOUNTER — Other Ambulatory Visit: Payer: Self-pay | Admitting: Podiatry

## 2023-08-07 ENCOUNTER — Other Ambulatory Visit: Payer: Self-pay | Admitting: Internal Medicine

## 2023-08-10 DIAGNOSIS — E1165 Type 2 diabetes mellitus with hyperglycemia: Secondary | ICD-10-CM | POA: Diagnosis not present

## 2023-08-28 ENCOUNTER — Other Ambulatory Visit: Payer: Self-pay | Admitting: Neurology

## 2023-08-28 ENCOUNTER — Other Ambulatory Visit: Payer: Self-pay | Admitting: Internal Medicine

## 2023-08-28 ENCOUNTER — Other Ambulatory Visit: Payer: Self-pay | Admitting: Podiatry

## 2023-08-29 ENCOUNTER — Other Ambulatory Visit: Payer: Self-pay

## 2023-10-05 ENCOUNTER — Encounter: Payer: Self-pay | Admitting: Internal Medicine

## 2023-10-27 ENCOUNTER — Encounter: Payer: Self-pay | Admitting: Internal Medicine

## 2023-10-27 ENCOUNTER — Ambulatory Visit: Admitting: Internal Medicine

## 2023-10-27 VITALS — BP 120/70 | HR 85 | Ht 63.0 in | Wt 197.6 lb

## 2023-10-27 DIAGNOSIS — E7800 Pure hypercholesterolemia, unspecified: Secondary | ICD-10-CM | POA: Diagnosis not present

## 2023-10-27 DIAGNOSIS — Z794 Long term (current) use of insulin: Secondary | ICD-10-CM | POA: Diagnosis not present

## 2023-10-27 DIAGNOSIS — E1165 Type 2 diabetes mellitus with hyperglycemia: Secondary | ICD-10-CM | POA: Diagnosis not present

## 2023-10-27 LAB — POCT GLYCOSYLATED HEMOGLOBIN (HGB A1C): Hemoglobin A1C: 6.7 % — AB (ref 4.0–5.6)

## 2023-10-27 NOTE — Progress Notes (Signed)
 Patient ID: Courtney [name], female   DOB: 12/21/1975, 48 y.o.   MRN: [id number]  HPI: Courtney [name] is a 48 y.o.-year-old female, returning for follow-up for DM2, dx in 2016 (when she presented in hyperglycemic nonketotic hyperosmolar state), insulin -dependent since 2018, uncontrolled, without long-term complications. Pt. previously saw Dr. Kassie, but last visit with me 4 months ago.  Interim history: No increased urination, blurry vision, chest pain. She has some nausea, feels this is related to her eyes as she has a history of optic neuritis >> plans to see ophthalmology.  Reviewed HbA1c: Lab Results  Component Value Date   HGBA1C 6.3 07/28/2023   HGBA1C 7.9 (A) 03/30/2023   HGBA1C 6.9 (A) 04/15/2022   HGBA1C 6.6 (A) 12/09/2021   HGBA1C 7.2 (A) 08/23/2021   HGBA1C 8.2 (H) 06/24/2021   HGBA1C 8.4 (A) 05/07/2021   HGBA1C 8.7 (A) 03/05/2021   HGBA1C 8.0 (A) 01/04/2021   HGBA1C 8.1 (H) 12/24/2020   At our first visit, she was on: - Rybelsus  14 mg at waking up >> Ozempic  0.5 mg weekly -tolerated well - Lantus  28 units at bedtime - Lispro 23-18-12 units before meals >> 15 min before meals She tried metformin  but this caused nausea. She tried Invokana  but this caused vaginitis. Per review of Dr. Laymond note, she declined weight loss surgery.   Also, per his note, she declined an insulin  pump.  Now on: - Metformin  ER 1000 mg in am >> moved to dinnertime - Lantus  28 >> 34-36 >> 25 units at bedtime - Humalog  - 15 min before meals: - before coffee: 20 >> 15 units - before lunch: 16-20 >> 12 units - before dinner: 14-18 >> 16-20 units (add 2 to 4 units before a larger dinner) >> 12 units - Ozempic  1 >> 2 mg weekly   Pt checks her sugars >4x a day with the Libre CGM [facility]):  Previously:   Previously:   Lowest sugar was 65 >> ... 70 >> 65; she has hypoglycemia awareness at 70.  Highest sugar was 300s >> 200s >> 200s.  - no CKD, last BUN/creatinine:  Lab Results   Component Value Date   BUN 13 07/28/2023   BUN 13 03/30/2023   CREATININE 0.97 07/28/2023   CREATININE 1.06 (H) 03/30/2023   Lab Results  Component Value Date   MICRALBCREAT 8.7 07/28/2023   MICRALBCREAT 11 03/30/2023  On Cozaar  100 mg daily.  -+ HL; last set of lipids: Lab Results  Component Value Date   CHOL 116 07/28/2023   HDL 44.90 07/28/2023   LDLCALC 41 07/28/2023   LDLDIRECT 53.0 06/24/2021   TRIG 149.0 07/28/2023   CHOLHDL 3 07/28/2023  On atorvastatin  40 mg daily.  She cannot take fenofibrate  due to rash.  At last visit I suggested to add 1000 mg fish oil daily.  - last eye exam was in 02/2022. No DR reportedly.   - no numbness and tingling in her feet. She is on gabapentin  300 mg tablets.  Last foot exam 07/28/2023.  She also has a history of HTN, MS, fibromyalgia, chronic fatigue, OSA-on CPAP, GERD, IBS, vitamin D  deficiency, B12 anemia. 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  ROS: + see HPI  Past Medical History:  Diagnosis Date   ALLERGIC RHINITIS 03/13/2007   Allergy    Anemia, B12 deficiency    Anxiety    C. difficile colitis    CARPAL TUNNEL SYNDROME, BILATERAL 01/13/2010   Cervical  lesion 07/13/2011   Spine lesion - indeterminate, for f/u MRI may 2013 per neurology   Chronic fatigue    CTS (carpal tunnel syndrome)    Diabetes mellitus without complication (HCC)    ESSENTIAL HYPERTENSION 03/13/2007   Fibromyalgia    GERD (gastroesophageal reflux disease)    Heart murmur    Hx of adenomatous polyp of colon 06/09/2009   Hyperlipidemia    HYPERLIPIDEMIA 03/13/2007   Irritable bowel syndrome 05/24/2010   Multiple sclerosis (HCC)    OBSTRUCTIVE SLEEP APNEA 12/08/2008   Sleep apnea    uses c pap   VITAMIN D  DEFICIENCY 01/13/2010   Past Surgical History:  Procedure Laterality Date   APPENDECTOMY  07/23/12   CARPAL TUNNEL RELEASE  01/2010   bilateral   COLONOSCOPY W/ BIOPSIES AND POLYPECTOMY  05/2009   2 tubular  adenomas   LAPAROSCOPIC APPENDECTOMY N/A 07/23/2012   Procedure: APPENDECTOMY LAPAROSCOPIC;  Surgeon: Camellia CHRISTELLA Blush, MD;  Location: [facility];  Service: General;  Laterality: N/A;   Social History   Socioeconomic History   Marital status: Single    Spouse name: Not on file   Number of children: 2   Years of education: Not on file   Highest education level: 12th grade  Occupational History   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    Employer: UNEMPLOYED  Tobacco Use   Smoking status: Never   Smokeless tobacco: Never  Vaping Use   Vaping status: Never Used  Substance and Sexual Activity   Alcohol use: No    Alcohol/week: 0.0 standard drinks of alcohol   Drug use: No   Sexual activity: Not on file  Other Topics Concern   Not on file  Social History Narrative   Daily Caffeine   Right handed   Two story home   Social Drivers of Health   Financial Resource Strain: Low Risk  (07/28/2023)   Overall Financial Resource Strain (CARDIA)    Difficulty of Paying Living Expenses: Not very hard  Food Insecurity: No Food Insecurity (07/28/2023)   Hunger Vital Sign    Worried About Running Out of Food in the Last Year: Never true    Ran Out of Food in the Last Year: Never true  Transportation Needs: No Transportation Needs (07/28/2023)   PRAPARE - Administrator, Civil Service (Medical): No    Lack of Transportation (Non-Medical): No  Physical Activity: Insufficiently Active (07/28/2023)   Exercise Vital Sign    Days of Exercise per Week: 5 days    Minutes of Exercise per Session: 20 min  Stress: Stress Concern Present (07/28/2023)   [employer] of Occupational Health - Occupational Stress Questionnaire    Feeling of Stress : To some extent  Social Connections: Moderately Integrated (07/28/2023)   Social Connection and Isolation Panel    Frequency of Communication with Friends and Family: More than three times a week    Frequency of Social Gatherings with Friends and Family:  Once a week    Attends Religious Services: More than 4 times per year    Active Member of [employer] or Organizations: Yes    Attends Engineer, structural: More than 4 times per year    Marital Status: Divorced  Catering manager Violence: Not on file   Current Outpatient Medications on File Prior to Visit  Medication Sig Dispense Refill   amLODipine  (NORVASC ) 10 MG tablet TAKE 1 TABLET BY MOUTH DAILY 100 tablet 2   atorvastatin  (LIPITOR) 40 MG tablet TAKE  1 TABLET BY MOUTH DAILY 100 tablet 2   baclofen  (LIORESAL ) 10 MG tablet TAKE 1 TABLET BY MOUTH 3 TIMES  DAILY 90 tablet 3   cetirizine (ZYRTEC) 10 MG tablet Take 10 mg by mouth daily.       Cholecalciferol  (VITAMIN D ) 2000 UNITS CAPS Take 2 capsules by mouth every morning. Reported on 06/05/2015     Continuous Glucose Sensor (FREESTYLE LIBRE 2 SENSOR) MISC 1 Device by Does not apply route every 14 (fourteen) days. 6 each 3   Continuous Glucose Sensor (FREESTYLE LIBRE 3 PLUS SENSOR) MISC 1 each by Does not apply route every 14 (fourteen) days. 6 each 3   cyanocobalamin  (,VITAMIN B-12,) 1000 MCG/ML injection Inject into the muscle every 30 (thirty) days.     famotidine  (PEPCID ) 20 MG tablet TAKE 1 TABLET BY MOUTH TWICE  DAILY 200 tablet 2   furosemide  (LASIX ) 40 MG tablet TAKE 1 TABLET BY MOUTH DAILY 100 tablet 2   gabapentin  (NEURONTIN ) 300 MG capsule TAKE 2 CAPSULES BY MOUTH IN THE  MORNING 1 CAPSULE BY MOUTH IN  THE AFTERNOON AND 2 CAPSULES BY  MOUTH IN THE EVENING 500 capsule 2   glucose blood (FREESTYLE TEST STRIPS) test strip 1 each by Other route 2 (two) times daily. And lancets, 2 times daily. 180 each 3   glucose monitoring kit (FREESTYLE) monitoring kit 1 each by Does not apply route as needed for other. Dispense any model that is covered- dispense testing supplies for Q AC/ HS accuchecks- 1 month supply with one refil. 1 each 1   insulin  glargine (LANTUS  SOLOSTAR) 100 UNIT/ML Solostar Pen Inject 34-36 Units into the skin at  bedtime. 30 mL 3   insulin  lispro (HUMALOG  KWIKPEN) 100 UNIT/ML KwikPen Inject under skin up to 60 units a day as advised 45 mL 3   Insulin  Pen Needle 32G X 4 MM MISC Use 4x a day 400 each 3   loperamide  (IMODIUM ) 2 MG capsule Take 2 mg by mouth as needed for diarrhea or loose stools.     losartan  (COZAAR ) 100 MG tablet TAKE 1 TABLET BY MOUTH DAILY 100 tablet 2   medroxyPROGESTERone  (DEPO-PROVERA ) 150 MG/ML injection Inject 150 mg into the muscle every 3 (three) months.       meloxicam  (MOBIC ) 15 MG tablet TAKE 1 TABLET BY MOUTH DAILY 100 tablet 2   metFORMIN  (GLUCOPHAGE -XR) 500 MG 24 hr tablet Take 2 tablets (1,000 mg total) by mouth daily. 180 tablet 3   metoprolol  tartrate (LOPRESSOR ) 100 MG tablet TAKE 2 TABLETS BY MOUTH DAILY 200 tablet 2   nortriptyline  (PAMELOR ) 50 MG capsule TAKE 2 CAPSULES BY MOUTH EVERY  NIGHT AT BEDTIME 180 capsule 3   omeprazole-sodium bicarbonate (ZEGERID) 40-1100 MG per capsule Take 1 capsule by mouth daily.       potassium chloride  (KLOR-CON ) 10 MEQ tablet TAKE 2 TABLETS BY MOUTH DAILY 200 tablet 2   predniSONE  (STERAPRED UNI-PAK 21 TAB) 10 MG (21) TBPK tablet take 60mg  day 1, then 50mg  day 2, then 40mg  day 3, then 30mg  day 4, then 20mg  day 5, then 10mg  day 6, then STOP 21 tablet 0   Semaglutide , 2 MG/DOSE, 8 MG/3ML SOPN Inject 2 mg as directed once a week. 9 mL 3   Teriflunomide  14 MG TABS Take 1 tablet daily 30 tablet 5   traMADol  (ULTRAM ) 50 MG tablet Take 1 tablet (50 mg total) by mouth every 6 (six) hours as needed. 20 tablet 1   No current  facility-administered medications on file prior to visit.   Allergies  Allergen Reactions   Ciprofloxacin  In D5w Rash   Penicillins     REACTION: rash   Tricor  [Fenofibrate ] Rash   Family History  Problem Relation Age of Onset   Migraines Mother    Hypertension Father    COPD Father    Diabetes Father    Prostate cancer Maternal Uncle        prostate cancer   Lung cancer Maternal Grandmother    Stroke  Maternal Grandfather    Heart attack Maternal Grandfather    Diabetes Other    Hypertension Other    Asthma Daughter    Colon cancer Neg Hx    Esophageal cancer Neg Hx    Rectal cancer Neg Hx    Stomach cancer Neg Hx    PE: BP 120/70   Pulse 85   Ht 5' 3 (1.6 m)   Wt 197 lb 9.6 oz (89.6 kg)   SpO2 98%   BMI 35.00 kg/m  Wt Readings from Last 3 Encounters:  10/27/23 197 lb 9.6 oz (89.6 kg)  07/28/23 204 lb (92.5 kg)  06/23/23 203 lb 12.8 oz (92.4 kg)   Constitutional: overweight, in NAD Eyes: no exophthalmos ENT: no thyromegaly, no cervical lymphadenopathy Cardiovascular: RRR, No MRG, + B LE edema (chronic) Respiratory: CTA B Musculoskeletal: no deformities Skin: no rashes Neurological: + Mild tremor with outstretched hands (chronic)  ASSESSMENT: 1. DM2, insulin -dependent, uncontrolled, without long-term complications  2. HL  PLAN:  1. Patient with longstanding, uncontrolled, type 2 diabetes, previously uncontrolled, but with improved control at last visit, when an HbA1c returned 6.3%.  At that time, sugars were fluctuating mostly within the target range with occasional higher values after dinner but not consistently.  We did discuss about using a slightly higher dose of Humalog  before larger dinners but I otherwise did not suggest other changes in her regimen. CGM interpretation: -At today's visit, we reviewed her CGM downloads: It appears that 91% of values are in target range (goal >70%), while 9% are higher than 180 (goal <25%), and 0% are lower than 70 (goal <4%).  The calculated average blood sugar is 142.  The projected HbA1c for the next 3 months (GMI) is 6.7%. -Reviewing the CGM trends, sugars appear to be fairly well-controlled, fluctuating within the target range but there is trend towards increasing blood sugars overnight, peaking around 8 AM, around the time when she is moving around and then drinking her coffee.  Sugars are dropping afterwards so for now I did  not recommend to increase her insulin  with coffee.  Of note, she decreased her doses of insulin  since last visit, after increasing her Ozempic  dose to 2 mg weekly.  For now, I did not suggest a change in the regimen but at next visit, if sugars continue to increase overnight, we may need to give her a higher dose of Lantus .  Also, she occasionally sees higher blood sugars after dinner and we again discussed that for larger meals, she may need to add 2 to 4 units, but I did not recommended to change the dose with regular meals.  Will also continue the rest of the regimen for now. -I suggested to: Patient Instructions  Please continue: - Metforin ER 1000 mg with dinner - Lantus  25 units at bedtime - Humalog  - 15 min before meals: - before coffee: 15 units - before lunch: 12 units - before dinner: 12 units - Ozempic  2 mg weekly  Please return in 3-4 months.  - we checked her HbA1c: 6.7% (higher) - advised to check sugars at different times of the day - 4x a day, rotating check times - advised for yearly eye exams >> she is not UTD -she needs to schedule another appointment! - return to clinic in 3-4 months  2. HL - Latest lipid panel was reviewed from 3 months ago and this was significantly improved, with all fractions at goal, Lab Results  Component Value Date   CHOL 116 07/28/2023   HDL 44.90 07/28/2023   LDLCALC 41 07/28/2023   LDLDIRECT 53.0 06/24/2021   TRIG 149.0 07/28/2023   CHOLHDL 3 07/28/2023  - She continues Lipitor 40 mg daily without side effects.  At last visit we discussed about either starting fenofibrate  or fish oil but she developed a rash with fenofibrate  in the past so I suggested to start fish oil 1000 mg daily with a plan to increase the dose if needed.  For now, due to the significant improvement in her lipid panel, we will continue the same doses.  Lela Fendt, MD PhD [facility]

## 2023-10-27 NOTE — Patient Instructions (Addendum)
 Please continue: - Metforin ER 1000 mg with dinner - Lantus  25 units at bedtime - Humalog  - 15 min before meals: - before coffee: 15 units - before lunch: 12 units - before dinner: 12 units - Ozempic  2 mg weekly    Please return in 3-4 months.

## 2023-11-05 ENCOUNTER — Other Ambulatory Visit: Payer: Self-pay | Admitting: Internal Medicine

## 2023-11-07 ENCOUNTER — Encounter: Payer: Self-pay | Admitting: Internal Medicine

## 2023-11-07 MED ORDER — FREESTYLE LIBRE 3 PLUS SENSOR MISC: 1.0000 | 3 refills | Status: AC

## 2023-11-16 DIAGNOSIS — E1165 Type 2 diabetes mellitus with hyperglycemia: Secondary | ICD-10-CM | POA: Diagnosis not present

## 2023-12-15 ENCOUNTER — Ambulatory Visit: Payer: Medicare Other | Admitting: Neurology

## 2023-12-19 NOTE — Progress Notes (Unsigned)
 NEUROLOGY FOLLOW UP OFFICE NOTE  Courtney [name] [id number]  Assessment/Plan:   Multiple sclerosis     DMT:  Teriflunomide  14mg  daily Pain management:  Gabapentin  600mg /300mg /600mg , baclofen , nortriptyline  100mg  at bedtime D3 10,000 IU daily Check vit D, CBC with diff, LFTs today and in 6 months Check MRI of brain and cervical spine with and without contrast in 6 months Follow up after repeat tests in 6 months.      Subjective:  Courtney [name] is a 48 year old right-handed Caucasian woman who follows up for multiple sclerosis.   UPDATE: Current DMT:  Aubagio  Other current medications: baclofen  10 mg, gabapentin  600mg /300mg /600mg , D3 10000 IU daily, B12 1000mcg injection every 30 days, nortriptyline  100 mg  02/18/2023 MRI BRAIN W WO:  1.  Several scattered T2/FLAIR hyperintense white matter lesions in the periventricular and subcortical white matter are nonspecific, but a few are new or more conspicuous from prior MRI 10/10/2015. These could be seen with a demyelinating process versus the sequela of migraines, mild chronic small vessel disease or gliosis from other remote insults. None of these enhance or restrict diffusion.  2.  No acute superimposed intracranial abnormality.  02/18/2023 MRI C-SPINE W WO:  1.  Focus of T2/STIR hyperintensity within the central and right lateral aspect of the spinal cord at C4 is similar to slightly less conspicuous from 10/10/2015.  No new cord signal abnormality or abnormal enhancement.  2.  Mild degenerative disc changes at C5-C6 with no high grade canal or foraminal stenosis within the cervical spine.  02/18/2023 MRI T-SPINE W WO:  No high-grade canal or foraminal stenosis within the thoracic spine.  No thoracic spinal cord signal abnormality. No abnormal enhancement identified.   07/28/2023 LABS:  CBC with WBC 7.8, HGB 11.8, HCT 35.4, PLT 309, ALC 1.4; hepatic panel with t bili 0.4, ALP 70, AST 15, ALT 17; vit D 55.47; B12 >1537   Vision:No  issues Motor: notes residual right upper and lower extremity weakness.  Notes grip in both hands aren't as strong. Sensory: may get facial numbness with heat and stress.  Pain: stable Gait: balance okay but feels that she is walking a little slower. Bowel/Bladder:  No change Fatigue: No Cognition: Sometimes she has trouble remembering new information Mood: Stable.  Has had stress as her father fell and fractured his back.  In hospital for 2-3 weeks.       HISTORY: Onset of symptoms and diagnosis occurred in 2006.  She experienced an episode of numbness in her right arm and both lower extremities.  She was dropping objects.  MRI of brain reportedly showed "solitary right mid-to-posterior frontal periventricular white matter lesion".  MRI of spine revealed no cord demyelination.  CSF analysis from lumbar puncture reportedly revealed oligoclonal bands and an elevated IgG index.  She was diagnosed with clinically isolated syndrome but was officially diagnosed with multiple sclerosis a few months later when she developed a relapse with left hand numbness.  In February 2013, she developed left arm pain and swelling.  MRI of cervical spine revealed an enhancing spinal cord lesion at C4 on the right.  She was treated with IV Solumedrol.  In 2014, she had an episode of optic neuritis in her right eye, treated with IV Solumedrol.  In September-October 2019, she reported to 4 weeks history of tingling in her face, which may occur off and on for 10 minutes 3 times a week.  She was prescribed a prednisone  taper.  Compared to prior MRI  from 02/07/2017, repeat MRI of the brain and cervical spine with and without contrast from 02/19/2018, which were personally reviewed, demonstrated mildly progressed bifrontal cerebral white matter changes as well as probable additional abnormal signal in the left cord at the C6-7 level but no abnormal enhancement to suggest active demyelinating disease. Questionable if paresthesias were  secondary to B12 deficiency as well.   Other history:  History of migraines and ocular migraines   There is no family history of MS.   Past disease modifying therapy:  Avonex (difficulty injecting), Rebif (painful injections), Tecfidera  (GI side effects)   Imaging: 06/03/2011:  MRI brain with and without contrast:  single right frontal periventricular white matter lesion with high T2 FLAIR signal 06/03/2011:  MRI cervical spine with and without contrast:  abnormal rim enhancing lesion in the spinal cord at the level of C4 on the right which mildly expands the cord. 01/21/2012:  MRI brain with and without contrast:  unchanged nonspecific right periventricular white matter lesion 01/21/2012: MRI cervical spine with and without contrast: stable nonenhancing cord lesion at C4 11/14/2012: MRI brain with and without contrast.  No change 11/14/2012:  MRI cervical spine with and without contrast:  No change 12/02/2013: MRI brain with and without contrast:  1. No substantial change in the right frontal juxtacortical, right corona radiata, and left periatrial white matter T2/FLAIR hyperintensities. While nonspecific, these likely reflect demyelinating lesions in this patient with a history of multiple sclerosis and concomitant cervical cord lesion. No new lesions are identified and none of the existing lesions enhance or restrict diffusion.  2. Subcentimeter left parafalcine dural based lesion as described above has slowly enlarged over time and demonstrates equivocal enhancement, but may represent a meningioma.  Recommend attention on follow up imaging. 12/02/2013:  MRI cervical spine with and without contrast:  No change 12/15/2014:  MRI brain with and without contrast:  1.  Similar appearance of focal T2/FLAIR hyperintense white matter lesion within the right lateral body of the corpus callosum without associated enhancement to suggest active demyelination.  2.  No new or enlarging white matter lesions  identified.  3.  Slowly enlarging nonenhancing nodular lesion along the left anterior aspect of the falx cerebri, which may reflect a small meningioma. 12/15/2014:  MRI cervical spine with and without contrast:  no change 10/10/2015:  MRI brain with and without contrast:  no change 10/10/2015:  MRI cervical spine with and without contrast:  no change 02/07/2017:  MRI brain with and without contrast: No significant change since 12/24/2008 02/07/2017: MRI cervical spine with and without contrast: Chronic lesion at C4 level, stable since 2013 02/19/2018:  MRI brain with and without contrast: Compared to prior imaging from 02/07/17, mildly progressed bifrontal cerebral white matter changes, right greater than left, but no abnormal enhancement to suggest active disease. 02/19/2018:  MRI cervical spine with and without contrast: Again demonstrated chronic lesion at C4 level as well as probable subtle patchy cord signal abnormality within the left cord at level of C6-7 not definitely seen on prior imaging from 02/07/17 but no abnormal enhancement to suggest active disease. 10/30/2019 MRI brain and cervical spine with and without contrast:  no significant change compared to prior imaging from 2019 01/23/2021 MRI brain and cervical spine with and without contrast:  stable compared to imaging from 10/30/2019  PAST MEDICAL HISTORY: Past Medical History:  Diagnosis Date   ALLERGIC RHINITIS 03/13/2007   Allergy    Anemia, B12 deficiency    Anxiety    C.  difficile colitis    CARPAL TUNNEL SYNDROME, BILATERAL 01/13/2010   Cervical lesion 07/13/2011   Spine lesion - indeterminate, for f/u MRI may 2013 per neurology   Chronic fatigue    CTS (carpal tunnel syndrome)    Diabetes mellitus without complication (HCC)    ESSENTIAL HYPERTENSION 03/13/2007   Fibromyalgia    GERD (gastroesophageal reflux disease)    Heart murmur    Hx of adenomatous polyp of colon 06/09/2009   Hyperlipidemia    HYPERLIPIDEMIA  03/13/2007   Irritable bowel syndrome 05/24/2010   Multiple sclerosis (HCC)    OBSTRUCTIVE SLEEP APNEA 12/08/2008   Sleep apnea    uses c pap   VITAMIN D  DEFICIENCY 01/13/2010    MEDICATIONS: Current Outpatient Medications on File Prior to Visit  Medication Sig Dispense Refill   amLODipine  (NORVASC ) 10 MG tablet TAKE 1 TABLET BY MOUTH DAILY 100 tablet 2   atorvastatin  (LIPITOR) 40 MG tablet TAKE 1 TABLET BY MOUTH DAILY 100 tablet 2   baclofen  (LIORESAL ) 10 MG tablet TAKE 1 TABLET BY MOUTH 3 TIMES  DAILY 90 tablet 3   cetirizine (ZYRTEC) 10 MG tablet Take 10 mg by mouth daily.       Cholecalciferol  (VITAMIN D ) 2000 UNITS CAPS Take 2 capsules by mouth every morning. Reported on 06/05/2015     Continuous Glucose Sensor (FREESTYLE LIBRE 3 PLUS SENSOR) MISC Inject 1 Device into the skin continuous. Change every 15 days 6 each 3   cyanocobalamin  (,VITAMIN B-12,) 1000 MCG/ML injection Inject into the muscle every 30 (thirty) days.     famotidine  (PEPCID ) 20 MG tablet TAKE 1 TABLET BY MOUTH TWICE  DAILY 200 tablet 2   furosemide  (LASIX ) 40 MG tablet TAKE 1 TABLET BY MOUTH DAILY 100 tablet 2   gabapentin  (NEURONTIN ) 300 MG capsule TAKE 2 CAPSULES BY MOUTH IN THE  MORNING 1 CAPSULE BY MOUTH IN  THE AFTERNOON AND 2 CAPSULES BY  MOUTH IN THE EVENING 500 capsule 2   glucose blood (FREESTYLE TEST STRIPS) test strip 1 each by Other route 2 (two) times daily. And lancets, 2 times daily. 180 each 3   glucose monitoring kit (FREESTYLE) monitoring kit 1 each by Does not apply route as needed for other. Dispense any model that is covered- dispense testing supplies for Q AC/ HS accuchecks- 1 month supply with one refil. 1 each 1   insulin  glargine (LANTUS  SOLOSTAR) 100 UNIT/ML Solostar Pen Inject 34-36 Units into the skin at bedtime. 30 mL 3   insulin  lispro (HUMALOG  KWIKPEN) 100 UNIT/ML KwikPen Inject under skin up to 60 units a day as advised 45 mL 3   Insulin  Pen Needle 32G X 4 MM MISC Use 4x a day 400 each 3    loperamide  (IMODIUM ) 2 MG capsule Take 2 mg by mouth as needed for diarrhea or loose stools.     losartan  (COZAAR ) 100 MG tablet TAKE 1 TABLET BY MOUTH DAILY 100 tablet 2   medroxyPROGESTERone  (DEPO-PROVERA ) 150 MG/ML injection Inject 150 mg into the muscle every 3 (three) months.       meloxicam  (MOBIC ) 15 MG tablet TAKE 1 TABLET BY MOUTH DAILY 100 tablet 2   metFORMIN  (GLUCOPHAGE -XR) 500 MG 24 hr tablet Take 2 tablets (1,000 mg total) by mouth daily. 180 tablet 3   metoprolol  tartrate (LOPRESSOR ) 100 MG tablet TAKE 2 TABLETS BY MOUTH DAILY 200 tablet 2   nortriptyline  (PAMELOR ) 50 MG capsule TAKE 2 CAPSULES BY MOUTH EVERY  NIGHT AT BEDTIME 180 capsule  3   omeprazole-sodium bicarbonate (ZEGERID) 40-1100 MG per capsule Take 1 capsule by mouth daily.       potassium chloride  (KLOR-CON ) 10 MEQ tablet TAKE 2 TABLETS BY MOUTH DAILY 200 tablet 2   Semaglutide , 2 MG/DOSE, 8 MG/3ML SOPN Inject 2 mg as directed once a week. 9 mL 3   Teriflunomide  14 MG TABS Take 1 tablet daily 30 tablet 5   traMADol  (ULTRAM ) 50 MG tablet Take 1 tablet (50 mg total) by mouth every 6 (six) hours as needed. 20 tablet 1   No current facility-administered medications on file prior to visit.    ALLERGIES: Allergies  Allergen Reactions   Ciprofloxacin  In D5w Rash   Penicillins     REACTION: rash   Tricor  [Fenofibrate ] Rash    FAMILY HISTORY: Family History  Problem Relation Age of Onset   Migraines Mother    Hypertension Father    COPD Father    Diabetes Father    Prostate cancer Maternal Uncle        prostate cancer   Lung cancer Maternal Grandmother    Stroke Maternal Grandfather    Heart attack Maternal Grandfather    Diabetes Other    Hypertension Other    Asthma Daughter    Colon cancer Neg Hx    Esophageal cancer Neg Hx    Rectal cancer Neg Hx    Stomach cancer Neg Hx       Objective:  *** General: No acute distress.  Patient appears well-groomed.   Head:  Normocephalic/atraumatic Eyes:   Fundi examined but not visualized Neurological Exam: Alert and oriented.  Speech fluent and not dysarthric.  Language intact.  CN II-XII intact.  Bulk and tone normal.  Muscle strength 5-/5 bilateral deltoids, right grip and right lower extremity, otherwise, 5/5 throughout.  Slight postural tremor in right hand.  Sensation to pinprick intact.  Slightly reduced vibratory sensation in right foot.  Deep tendon reflexes 1+ throughout, toes downgoing.  Finger to nose testing intact.  Steady gait.  Romberg negative. ***   Juliene Dunnings, DO  CC: Lynwood Rush, MD

## 2023-12-20 ENCOUNTER — Ambulatory Visit: Admitting: Neurology

## 2023-12-20 ENCOUNTER — Other Ambulatory Visit

## 2023-12-20 ENCOUNTER — Encounter: Payer: Self-pay | Admitting: Neurology

## 2023-12-20 VITALS — BP 119/78 | HR 79 | Resp 20 | Ht 63.0 in | Wt 196.0 lb

## 2023-12-20 DIAGNOSIS — G35 Multiple sclerosis: Secondary | ICD-10-CM | POA: Diagnosis not present

## 2023-12-20 DIAGNOSIS — Z79899 Other long term (current) drug therapy: Secondary | ICD-10-CM

## 2023-12-20 MED ORDER — TERIFLUNOMIDE 14 MG PO TABS: ORAL_TABLET | ORAL | 5 refills | Status: AC

## 2023-12-20 NOTE — Patient Instructions (Signed)
 Restart teriflunomide  14mg  daily Continue gabapentin , baclofen , nortriptyline  and vit D3 Check labs today:  CBC with diff, hepatic panel, vitamin D , vitamin B12 Check MRI of brain and cervical spine with and without contrast Follow up 6 months.

## 2023-12-21 ENCOUNTER — Ambulatory Visit: Payer: Self-pay | Admitting: Neurology

## 2023-12-21 LAB — VITAMIN D 25 HYDROXY (VIT D DEFICIENCY, FRACTURES): Vit D, 25-Hydroxy: 85 ng/mL (ref 30–100)

## 2023-12-21 LAB — HEPATIC FUNCTION PANEL
AG Ratio: 1.8 (calc) (ref 1.0–2.5)
ALT: 21 U/L (ref 6–29)
AST: 15 U/L (ref 10–35)
Albumin: 4.2 g/dL (ref 3.6–5.1)
Alkaline phosphatase (APISO): 64 U/L (ref 31–125)
Bilirubin, Direct: 0.1 mg/dL (ref 0.0–0.2)
Globulin: 2.3 g/dL (ref 1.9–3.7)
Indirect Bilirubin: 0.5 mg/dL (ref 0.2–1.2)
Total Bilirubin: 0.6 mg/dL (ref 0.2–1.2)
Total Protein: 6.5 g/dL (ref 6.1–8.1)

## 2023-12-21 LAB — CBC WITH DIFFERENTIAL/PLATELET
Absolute Lymphocytes: 1400 {cells}/uL (ref 850–3900)
Absolute Monocytes: 302 {cells}/uL (ref 200–950)
Basophils Absolute: 60 {cells}/uL (ref 0–200)
Basophils Relative: 0.9 %
Eosinophils Absolute: 362 {cells}/uL (ref 15–500)
Eosinophils Relative: 5.4 %
HCT: 34.1 % — ABNORMAL LOW (ref 35.0–45.0)
Hemoglobin: 10.7 g/dL — ABNORMAL LOW (ref 11.7–15.5)
MCH: 28.3 pg (ref 27.0–33.0)
MCHC: 31.4 g/dL — ABNORMAL LOW (ref 32.0–36.0)
MCV: 90.2 fL (ref 80.0–100.0)
MPV: 10 fL (ref 7.5–12.5)
Monocytes Relative: 4.5 %
Neutro Abs: 4576 {cells}/uL (ref 1500–7800)
Neutrophils Relative %: 68.3 %
Platelets: 291 Thousand/uL (ref 140–400)
RBC: 3.78 Million/uL — ABNORMAL LOW (ref 3.80–5.10)
RDW: 13.6 % (ref 11.0–15.0)
Total Lymphocyte: 20.9 %
WBC: 6.7 Thousand/uL (ref 3.8–10.8)

## 2023-12-21 LAB — VITAMIN B12: Vitamin B-12: 212 pg/mL (ref 200–1100)

## 2023-12-25 ENCOUNTER — Ambulatory Visit (INDEPENDENT_AMBULATORY_CARE_PROVIDER_SITE_OTHER)

## 2023-12-25 ENCOUNTER — Telehealth: Payer: Self-pay

## 2023-12-25 DIAGNOSIS — E538 Deficiency of other specified B group vitamins: Secondary | ICD-10-CM

## 2023-12-25 MED ORDER — CYANOCOBALAMIN 1000 MCG/ML IJ SOLN
1000.0000 ug | Freq: Once | INTRAMUSCULAR | Status: AC
  Administered 2023-12-25: 1000 ug via INTRAMUSCULAR

## 2023-12-25 NOTE — Progress Notes (Signed)
 Pt here for monthly B12 injection per Dr. Norleen  B12 1000mcg given IM and pt tolerated injection well.  Next B12 injection scheduled for NEXT MONTH

## 2023-12-25 NOTE — Telephone Encounter (Signed)
 Copied from CRM #[id number]. Topic: Appointments - Scheduling Inquiry for Clinic >> Dec 22, 2023  2:13 PM Thersia C wrote: Reason for CRM: Patient called in regarding getting scheduled for a Depo shot would like a callback once she is able to be scheduled

## 2023-12-26 NOTE — Progress Notes (Unsigned)
 [name] [address] [place] [address] Phone: [phone] Subjective:    I'm seeing this patient by the request  of:  Norleen Lynwood ORN, MD  CC:   YEP:[id number]  Courtney [name] is a 48 y.o. female coming in with complaint of knee pain  Onset-  Location Duration-  Character- Aggravating factors- Reliving factors-  Therapies tried-  Severity-     Past Medical History:  Diagnosis Date   ALLERGIC RHINITIS 03/13/2007   Allergy    Anemia, B12 deficiency    Anxiety    C. difficile colitis    CARPAL TUNNEL SYNDROME, BILATERAL 01/13/2010   Cervical lesion 07/13/2011   Spine lesion - indeterminate, for f/u MRI may 2013 per neurology   Chronic fatigue    CTS (carpal tunnel syndrome)    Diabetes mellitus without complication (HCC)    ESSENTIAL HYPERTENSION 03/13/2007   Fibromyalgia    GERD (gastroesophageal reflux disease)    Heart murmur    Hx of adenomatous polyp of colon 06/09/2009   Hyperlipidemia    HYPERLIPIDEMIA 03/13/2007   Irritable bowel syndrome 05/24/2010   Multiple sclerosis (HCC)    OBSTRUCTIVE SLEEP APNEA 12/08/2008   Sleep apnea    uses c pap   VITAMIN D  DEFICIENCY 01/13/2010   Past Surgical History:  Procedure Laterality Date   APPENDECTOMY  07/23/12   CARPAL TUNNEL RELEASE  01/2010   bilateral   COLONOSCOPY W/ BIOPSIES AND POLYPECTOMY  05/2009   2 tubular adenomas   LAPAROSCOPIC APPENDECTOMY N/A 07/23/2012   Procedure: APPENDECTOMY LAPAROSCOPIC;  Surgeon: Camellia CHRISTELLA Blush, MD;  Location: [facility] OR;  Service: General;  Laterality: N/A;   Social History   Socioeconomic History   Marital status: Single    Spouse name: Not on file   Number of children: 2   Years of education: Not on file   Highest education level: 12th grade  Occupational History   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    Employer: UNEMPLOYED  Tobacco Use   Smoking status: Never   Smokeless tobacco: Never  Vaping Use   Vaping status: Never Used   Substance and Sexual Activity   Alcohol use: No    Alcohol/week: 0.0 standard drinks of alcohol   Drug use: No   Sexual activity: Not on file  Other Topics Concern   Not on file  Social History Narrative   Daily Caffeine   Right handed   Two story home   Social Drivers of Health   Financial Resource Strain: Low Risk  (07/28/2023)   Overall Financial Resource Strain (CARDIA)    Difficulty of Paying Living Expenses: Not very hard  Food Insecurity: No Food Insecurity (07/28/2023)   Hunger Vital Sign    Worried About Running Out of Food in the Last Year: Never true    Ran Out of Food in the Last Year: Never true  Transportation Needs: No Transportation Needs (07/28/2023)   PRAPARE - Administrator, Civil Service (Medical): No    Lack of Transportation (Non-Medical): No  Physical Activity: Insufficiently Active (07/28/2023)   Exercise Vital Sign    Days of Exercise per Week: 5 days    Minutes of Exercise per Session: 20 min  Stress: Stress Concern Present (07/28/2023)   [employer] of Occupational Health - Occupational Stress Questionnaire    Feeling of Stress : To some extent  Social Connections: Moderately Integrated (07/28/2023)   Social Connection and Isolation Panel    Frequency  of Communication with Friends and Family: More than three times a week    Frequency of Social Gatherings with Friends and Family: Once a week    Attends Religious Services: More than 4 times per year    Active Member of [employer] or Organizations: Yes    Attends Engineer, structural: More than 4 times per year    Marital Status: Divorced   Allergies  Allergen Reactions   Ciprofloxacin  In D5w Rash   Penicillins     REACTION: rash   Tricor  [Fenofibrate ] Rash   Family History  Problem Relation Age of Onset   Migraines Mother    Hypertension Father    COPD Father    Diabetes Father    Prostate cancer Maternal Uncle        prostate cancer   Lung cancer Maternal Grandmother     Stroke Maternal Grandfather    Heart attack Maternal Grandfather    Diabetes Other    Hypertension Other    Asthma Daughter    Colon cancer Neg Hx    Esophageal cancer Neg Hx    Rectal cancer Neg Hx    Stomach cancer Neg Hx     Current Outpatient Medications (Endocrine & Metabolic):    insulin  glargine (LANTUS  SOLOSTAR) 100 UNIT/ML Solostar Pen, Inject 34-36 Units into the skin at bedtime.   insulin  lispro (HUMALOG  KWIKPEN) 100 UNIT/ML KwikPen, Inject under skin up to 60 units a day as advised   medroxyPROGESTERone  (DEPO-PROVERA ) 150 MG/ML injection, Inject 150 mg into the muscle every 3 (three) months.     metFORMIN  (GLUCOPHAGE -XR) 500 MG 24 hr tablet, Take 2 tablets (1,000 mg total) by mouth daily.   Semaglutide , 2 MG/DOSE, 8 MG/3ML SOPN, Inject 2 mg as directed once a week.  Current Outpatient Medications (Cardiovascular):    amLODipine  (NORVASC ) 10 MG tablet, TAKE 1 TABLET BY MOUTH DAILY   atorvastatin  (LIPITOR) 40 MG tablet, TAKE 1 TABLET BY MOUTH DAILY   furosemide  (LASIX ) 40 MG tablet, TAKE 1 TABLET BY MOUTH DAILY   losartan  (COZAAR ) 100 MG tablet, TAKE 1 TABLET BY MOUTH DAILY   metoprolol  tartrate (LOPRESSOR ) 100 MG tablet, TAKE 2 TABLETS BY MOUTH DAILY  Current Outpatient Medications (Respiratory):    cetirizine (ZYRTEC) 10 MG tablet, Take 10 mg by mouth daily.    Current Outpatient Medications (Analgesics):    meloxicam  (MOBIC ) 15 MG tablet, TAKE 1 TABLET BY MOUTH DAILY   traMADol  (ULTRAM ) 50 MG tablet, Take 1 tablet (50 mg total) by mouth every 6 (six) hours as needed.  Current Outpatient Medications (Hematological):    cyanocobalamin  (,VITAMIN B-12,) 1000 MCG/ML injection, Inject into the muscle every 30 (thirty) days.  Current Outpatient Medications (Other):    baclofen  (LIORESAL ) 10 MG tablet, TAKE 1 TABLET BY MOUTH 3 TIMES  DAILY   Cholecalciferol  (VITAMIN D ) 2000 UNITS CAPS, Take 2 capsules by mouth every morning. Reported on 06/05/2015   Continuous Glucose  Sensor (FREESTYLE LIBRE 3 PLUS SENSOR) MISC, Inject 1 Device into the skin continuous. Change every 15 days   famotidine  (PEPCID ) 20 MG tablet, TAKE 1 TABLET BY MOUTH TWICE  DAILY   gabapentin  (NEURONTIN ) 300 MG capsule, TAKE 2 CAPSULES BY MOUTH IN THE  MORNING 1 CAPSULE BY MOUTH IN  THE AFTERNOON AND 2 CAPSULES BY  MOUTH IN THE EVENING   glucose blood (FREESTYLE TEST STRIPS) test strip, 1 each by Other route 2 (two) times daily. And lancets, 2 times daily.   glucose monitoring kit (FREESTYLE) monitoring  kit, 1 each by Does not apply route as needed for other. Dispense any model that is covered- dispense testing supplies for Q AC/ HS accuchecks- 1 month supply with one refil.   Insulin  Pen Needle 32G X 4 MM MISC, Use 4x a day   loperamide  (IMODIUM ) 2 MG capsule, Take 2 mg by mouth as needed for diarrhea or loose stools.   nortriptyline  (PAMELOR ) 50 MG capsule, TAKE 2 CAPSULES BY MOUTH EVERY  NIGHT AT BEDTIME   omeprazole-sodium bicarbonate (ZEGERID) 40-1100 MG per capsule, Take 1 capsule by mouth daily.     potassium chloride  (KLOR-CON ) 10 MEQ tablet, TAKE 2 TABLETS BY MOUTH DAILY   Teriflunomide  14 MG TABS, Take 1 tablet daily   Reviewed prior external information including notes and imaging from  primary care provider As well as notes that were available from care everywhere and other healthcare systems.  Past medical history, social, surgical and family history all reviewed in electronic medical record.  No pertanent information unless stated regarding to the chief complaint.   Review of Systems:  No headache, visual changes, nausea, vomiting, diarrhea, constipation, dizziness, abdominal pain, skin rash, fevers, chills, night sweats, weight loss, swollen lymph nodes, body aches, joint swelling, chest pain, shortness of breath, mood changes. POSITIVE muscle aches  Objective  There were no vitals taken for this visit.   General: No apparent distress alert and oriented x3 mood and affect  normal, dressed appropriately.  HEENT: Pupils equal, extraocular movements intact  Respiratory: Patient's speak in full sentences and does not appear short of breath  Cardiovascular: No lower extremity edema, non tender, no erythema      Impression and Recommendations:

## 2023-12-28 ENCOUNTER — Telehealth: Payer: Self-pay

## 2023-12-28 ENCOUNTER — Ambulatory Visit (INDEPENDENT_AMBULATORY_CARE_PROVIDER_SITE_OTHER)

## 2023-12-28 ENCOUNTER — Encounter (HOSPITAL_BASED_OUTPATIENT_CLINIC_OR_DEPARTMENT_OTHER): Payer: Self-pay

## 2023-12-28 ENCOUNTER — Ambulatory Visit (INDEPENDENT_AMBULATORY_CARE_PROVIDER_SITE_OTHER): Admitting: Family Medicine

## 2023-12-28 ENCOUNTER — Other Ambulatory Visit: Payer: Self-pay

## 2023-12-28 VITALS — BP 116/72 | HR 74 | Ht 63.0 in

## 2023-12-28 DIAGNOSIS — M25562 Pain in left knee: Secondary | ICD-10-CM

## 2023-12-28 DIAGNOSIS — M25561 Pain in right knee: Secondary | ICD-10-CM

## 2023-12-28 DIAGNOSIS — M170 Bilateral primary osteoarthritis of knee: Secondary | ICD-10-CM

## 2023-12-28 MED ORDER — TRIAMCINOLONE ACETONIDE 0.5 % EX CREA: 1.0000 | TOPICAL_CREAM | Freq: Two times a day (BID) | CUTANEOUS | 3 refills | Status: AC

## 2023-12-28 NOTE — Telephone Encounter (Signed)
-----   Message from Berwyn Posey sent at 12/28/2023  2:25 PM EDT ----- Regarding: visco Can you please run patient for B visco injections?  Thank you

## 2023-12-28 NOTE — Telephone Encounter (Signed)
 Patient ran for Durolane for bilateral knees on 12/28/23. Case ID: [id number]. Pending approval.

## 2023-12-28 NOTE — Patient Instructions (Addendum)
 Xray today Injected both knees Exercises See me in 2 months

## 2023-12-28 NOTE — Assessment & Plan Note (Signed)
 Bilateral injections given today.  He does have some worsening arthritis.  Discussed which activities to do and which ones to avoid.  Continue to work on weight loss.  May need the possibility of viscosupplementation again.  Follow-up with me again in 6 to 8 weeks.

## 2023-12-29 NOTE — Telephone Encounter (Signed)
 Patient scheduled 03/01/24  Durolane authorized for bilateral knee Coinsurance 80% Copay $25 Deductible does not apply OOP max $3900 has met $95.25 Once OOP has been met coverage goes to 100% Reference number [id number]

## 2024-01-01 NOTE — Telephone Encounter (Signed)
Called and left voice message for call back 

## 2024-01-02 NOTE — Telephone Encounter (Signed)
 Noted

## 2024-01-09 ENCOUNTER — Ambulatory Visit
Admission: RE | Admit: 2024-01-09 | Discharge: 2024-01-09 | Disposition: A | Discharge status: Home / Self Care | Source: Ambulatory Visit | Attending: Neurology | Admitting: Neurology

## 2024-01-09 DIAGNOSIS — M50222 Other cervical disc displacement at C5-C6 level: Secondary | ICD-10-CM | POA: Diagnosis not present

## 2024-01-09 DIAGNOSIS — G35 Multiple sclerosis: Secondary | ICD-10-CM

## 2024-01-09 MED ORDER — GADOPICLENOL 0.5 MMOL/ML IV SOLN
9.0000 mL | Freq: Once | INTRAVENOUS | Status: AC | PRN
Start: 2024-01-09 — End: 2024-01-09
  Administered 2024-01-09: 9 mL via INTRAVENOUS

## 2024-01-12 NOTE — Progress Notes (Signed)
 LMOVM for patient to call the office.

## 2024-01-12 NOTE — Progress Notes (Signed)
 Patient advised.

## 2024-01-20 ENCOUNTER — Encounter: Payer: Self-pay | Admitting: Internal Medicine

## 2024-01-26 ENCOUNTER — Ambulatory Visit

## 2024-01-26 DIAGNOSIS — E538 Deficiency of other specified B group vitamins: Secondary | ICD-10-CM

## 2024-01-26 MED ORDER — CYANOCOBALAMIN 1000 MCG/ML IJ SOLN
1000.0000 ug | Freq: Once | INTRAMUSCULAR | Status: AC
  Administered 2024-01-26: 1000 ug via INTRAMUSCULAR

## 2024-01-26 NOTE — Progress Notes (Signed)
Pt was given B12 injection with no complications.

## 2024-02-02 ENCOUNTER — Ambulatory Visit: Admitting: Internal Medicine

## 2024-02-02 VITALS — BP 124/82 | HR 77 | Temp 98.6°F | Ht 63.0 in | Wt 188.0 lb

## 2024-02-02 DIAGNOSIS — E7800 Pure hypercholesterolemia, unspecified: Secondary | ICD-10-CM

## 2024-02-02 DIAGNOSIS — I10 Essential (primary) hypertension: Secondary | ICD-10-CM | POA: Diagnosis not present

## 2024-02-02 DIAGNOSIS — Z23 Encounter for immunization: Secondary | ICD-10-CM | POA: Diagnosis not present

## 2024-02-02 DIAGNOSIS — Z7984 Long term (current) use of oral hypoglycemic drugs: Secondary | ICD-10-CM | POA: Diagnosis not present

## 2024-02-02 DIAGNOSIS — E538 Deficiency of other specified B group vitamins: Secondary | ICD-10-CM

## 2024-02-02 DIAGNOSIS — E1165 Type 2 diabetes mellitus with hyperglycemia: Secondary | ICD-10-CM | POA: Diagnosis not present

## 2024-02-02 DIAGNOSIS — J309 Allergic rhinitis, unspecified: Secondary | ICD-10-CM

## 2024-02-02 DIAGNOSIS — E041 Nontoxic single thyroid nodule: Secondary | ICD-10-CM | POA: Insufficient documentation

## 2024-02-02 DIAGNOSIS — E559 Vitamin D deficiency, unspecified: Secondary | ICD-10-CM | POA: Diagnosis not present

## 2024-02-02 MED ORDER — CYANOCOBALAMIN 1000 MCG/ML IJ SOLN: 1000.0000 ug | INTRAMUSCULAR | 3 refills | Status: AC

## 2024-02-02 NOTE — Patient Instructions (Addendum)
 You had the flu shot today  Ok to add the Nasacort  OTC for allergies  Please take all new medication as prescribed - the B12 shots at home ([facility] to [facility])  Please continue all other medications as before, and refills have been done if requested.  Please have the pharmacy call with any other refills you may need.  Please continue your efforts at being more active, low cholesterol diet, and weight control  Please keep your appointments with your specialists as you may have planned - neurology, GYN and eye doctor  No further labs needed today  Please make an Appointment to return in 6 months, or sooner if needed

## 2024-02-02 NOTE — Assessment & Plan Note (Signed)
 Lab Results  Component Value Date   LDLCALC 41 07/28/2023   Stable, pt to continue current statin lipitor 40 mg qd

## 2024-02-02 NOTE — Assessment & Plan Note (Signed)
 BP Readings from Last 3 Encounters:  02/02/24 124/82  12/28/23 116/72  12/20/23 119/78   Stable, pt to continue medical treatment norvasc  10 every day, losartan  100 mg every day, lopressor  100 mg bid

## 2024-02-02 NOTE — Assessment & Plan Note (Signed)
 Last vitamin D  Lab Results  Component Value Date   VD25OH 85 12/20/2023   Stable, cont oral replacement

## 2024-02-02 NOTE — Assessment & Plan Note (Signed)
 Lab Results  Component Value Date   VITAMINB12 212 12/20/2023   Low, pt prefers IM  - for start b12 1000 mcg weekly IM

## 2024-02-02 NOTE — Assessment & Plan Note (Signed)
 Lab Results  Component Value Date   HGBA1C 6.7 (A) 10/27/2023   Stable, pt to continue current medical treatment metformin  ER 500 mg - 2 every day, ozemipc 2 mg weekly

## 2024-02-02 NOTE — Assessment & Plan Note (Signed)
 Incidental found on recent MRI, 12 mm only - ok to follow for now but consider thyroid  u/s in 6-12 mo

## 2024-02-02 NOTE — Progress Notes (Signed)
 Chief Complaint: follow up HTN, HLD DM, low b12 and D, thyroid  nodule, allergic rhinitis       HPI:  Courtney [name] is a 48 y.o. female here overall doing ok, doing well on avagio per neurology though brain mri recent with 2 new small lesions.  Pt denies chest pain, increased sob or doe, wheezing, orthopnea, PND, increased LE swelling, palpitations, dizziness or syncope.   Pt denies polydipsia, polyuria, or new focal neuro s/s.    Pt denies fever, wt loss, night sweats, loss of appetite, or other constitutional symptoms   Has appt with gyn for pap, mammogram.  Plans to call for eye exam soon.   MRI also noted 12 mm thyroid  nodule and pt is concerned  Does have several wks ongoing nasal allergy symptoms with clearish congestion, itch and sneezing, without fever, pain, ST, cough, swelling or wheezing. Wt Readings from Last 3 Encounters:  02/02/24 188 lb (85.3 kg)  12/20/23 196 lb (88.9 kg)  10/27/23 197 lb 9.6 oz (89.6 kg)   BP Readings from Last 3 Encounters:  02/02/24 124/82  12/28/23 116/72  12/20/23 119/78         Past Medical History:  Diagnosis Date   ALLERGIC RHINITIS 03/13/2007   Allergy    Anemia, B12 deficiency    Anxiety    C. difficile colitis    CARPAL TUNNEL SYNDROME, BILATERAL 01/13/2010   Cervical lesion 07/13/2011   Spine lesion - indeterminate, for f/u MRI may 2013 per neurology   Chronic fatigue    CTS (carpal tunnel syndrome)    Diabetes mellitus without complication (HCC)    ESSENTIAL HYPERTENSION 03/13/2007   Fibromyalgia    GERD (gastroesophageal reflux disease)    Heart murmur    Hx of adenomatous polyp of colon 06/09/2009   Hyperlipidemia    HYPERLIPIDEMIA 03/13/2007   Irritable bowel syndrome 05/24/2010   Multiple sclerosis    OBSTRUCTIVE SLEEP APNEA 12/08/2008   Sleep apnea    uses c pap   VITAMIN D  DEFICIENCY 01/13/2010   Past Surgical History:  Procedure Laterality Date   APPENDECTOMY  07/23/12   CARPAL TUNNEL RELEASE  01/2010   bilateral    COLONOSCOPY W/ BIOPSIES AND POLYPECTOMY  05/2009   2 tubular adenomas   LAPAROSCOPIC APPENDECTOMY N/A 07/23/2012   Procedure: APPENDECTOMY LAPAROSCOPIC;  Surgeon: Camellia CHRISTELLA Blush, MD;  Location: [facility] OR;  Service: General;  Laterality: N/A;    reports that she has never smoked. She has never used smokeless tobacco. She reports that she does not drink alcohol and does not use drugs. family history includes Asthma in her daughter; COPD in her father; Diabetes in her father and another family member; Heart attack in her maternal grandfather; Hypertension in her father and another family member; Lung cancer in her maternal grandmother; Migraines in her mother; Prostate cancer in her maternal uncle; Stroke in her maternal grandfather. Allergies  Allergen Reactions   Ciprofloxacin  In D5w Rash   Penicillins     REACTION: rash   Tricor  [Fenofibrate ] Rash   Current Outpatient Medications on File Prior to Visit  Medication Sig Dispense Refill   amLODipine  (NORVASC ) 10 MG tablet TAKE 1 TABLET BY MOUTH DAILY 100 tablet 2   atorvastatin  (LIPITOR) 40 MG tablet TAKE 1 TABLET BY MOUTH DAILY 100 tablet 2   baclofen  (LIORESAL ) 10 MG tablet TAKE 1 TABLET BY MOUTH 3 TIMES  DAILY 90 tablet 3   cetirizine (ZYRTEC) 10 MG tablet Take 10  mg by mouth daily.       Cholecalciferol  (VITAMIN D ) 2000 UNITS CAPS Take 2 capsules by mouth every morning. Reported on 06/05/2015     Continuous Glucose Sensor (FREESTYLE LIBRE 3 PLUS SENSOR) MISC Inject 1 Device into the skin continuous. Change every 15 days 6 each 3   famotidine  (PEPCID ) 20 MG tablet TAKE 1 TABLET BY MOUTH TWICE  DAILY 200 tablet 2   furosemide  (LASIX ) 40 MG tablet TAKE 1 TABLET BY MOUTH DAILY 100 tablet 2   gabapentin  (NEURONTIN ) 300 MG capsule TAKE 2 CAPSULES BY MOUTH IN THE  MORNING 1 CAPSULE BY MOUTH IN  THE AFTERNOON AND 2 CAPSULES BY  MOUTH IN THE EVENING 500 capsule 2   glucose blood (FREESTYLE TEST STRIPS) test strip 1 each by Other route 2 (two) times daily.  And lancets, 2 times daily. 180 each 3   glucose monitoring kit (FREESTYLE) monitoring kit 1 each by Does not apply route as needed for other. Dispense any model that is covered- dispense testing supplies for Q AC/ HS accuchecks- 1 month supply with one refil. 1 each 1   insulin  lispro (HUMALOG  KWIKPEN) 100 UNIT/ML KwikPen Inject under skin up to 60 units a day as advised 45 mL 3   Insulin  Pen Needle 32G X 4 MM MISC Use 4x a day 400 each 3   loperamide  (IMODIUM ) 2 MG capsule Take 2 mg by mouth as needed for diarrhea or loose stools.     losartan  (COZAAR ) 100 MG tablet TAKE 1 TABLET BY MOUTH DAILY 100 tablet 2   medroxyPROGESTERone  (DEPO-PROVERA ) 150 MG/ML injection Inject 150 mg into the muscle every 3 (three) months.       meloxicam  (MOBIC ) 15 MG tablet TAKE 1 TABLET BY MOUTH DAILY 100 tablet 2   metFORMIN  (GLUCOPHAGE -XR) 500 MG 24 hr tablet Take 2 tablets (1,000 mg total) by mouth daily. 180 tablet 3   metoprolol  tartrate (LOPRESSOR ) 100 MG tablet TAKE 2 TABLETS BY MOUTH DAILY 200 tablet 2   nortriptyline  (PAMELOR ) 50 MG capsule TAKE 2 CAPSULES BY MOUTH EVERY  NIGHT AT BEDTIME 180 capsule 3   omeprazole-sodium bicarbonate (ZEGERID) 40-1100 MG per capsule Take 1 capsule by mouth daily.       potassium chloride  (KLOR-CON ) 10 MEQ tablet TAKE 2 TABLETS BY MOUTH DAILY 200 tablet 2   Semaglutide , 2 MG/DOSE, 8 MG/3ML SOPN Inject 2 mg as directed once a week. 9 mL 3   Teriflunomide  14 MG TABS Take 1 tablet daily 30 tablet 5   traMADol  (ULTRAM ) 50 MG tablet Take 1 tablet (50 mg total) by mouth every 6 (six) hours as needed. 20 tablet 1   triamcinolone  cream (KENALOG ) 0.5 % Apply 1 Application topically 2 (two) times daily. To affected areas. 30 g 3   No current facility-administered medications on file prior to visit.        ROS:  All others reviewed and negative.  Objective        PE:  BP 124/82 (BP Location: Left Arm, Patient Position: Sitting, Cuff Size: Normal)   Pulse 77   Temp 98.6 F (37  C) (Oral)   Ht 5' 3 (1.6 m)   Wt 188 lb (85.3 kg)   SpO2 99%   BMI 33.30 kg/m                 Constitutional: Pt appears in NAD               HENT: Head: NCAT.  Right Ear: External ear normal.                 Left Ear: External ear normal. Bilat tm's with mild erythema.  Max sinus areas non tender.  Pharynx with mild erythema, no exudate               Eyes: . Pupils are equal, round, and reactive to light. Conjunctivae and EOM are normal               Nose: without d/c or deformity               Neck: Neck supple. Gross normal ROM               Cardiovascular: Normal rate and regular rhythm.                 Pulmonary/Chest: Effort normal and breath sounds without rales or wheezing.                Abd:  Soft, NT, ND, + BS, no organomegaly               Neurological: Pt is alert. At baseline orientation, motor grossly intact               Skin: Skin is warm. No rashes, no other new lesions, LE edema - none               Psychiatric: Pt behavior is normal without agitation   Micro: none  Cardiac tracings I have personally interpreted today:  none  Pertinent Radiological findings (summarize): none   Lab Results  Component Value Date   WBC 6.7 12/20/2023   HGB 10.7 (L) 12/20/2023   HCT 34.1 (L) 12/20/2023   PLT 291 12/20/2023   GLUCOSE 86 07/28/2023   CHOL 116 07/28/2023   TRIG 149.0 07/28/2023   HDL 44.90 07/28/2023   LDLDIRECT 53.0 06/24/2021   LDLCALC 41 07/28/2023   ALT 21 12/20/2023   AST 15 12/20/2023   NA 141 07/28/2023   K 4.5 07/28/2023   CL 105 07/28/2023   CREATININE 0.97 07/28/2023   BUN 13 07/28/2023   CO2 29 07/28/2023   TSH 1.55 07/28/2023   INR 1.0 12/23/2010   HGBA1C 6.7 (A) 10/27/2023   MICROALBUR 2.2 (H) 07/28/2023   Assessment/Plan:  Courtney [name] is a 48 y.o. White or Caucasian [1] female with  has a past medical history of ALLERGIC RHINITIS (03/13/2007), Allergy, Anemia, B12 deficiency, Anxiety, C. difficile colitis, CARPAL TUNNEL  SYNDROME, BILATERAL (01/13/2010), Cervical lesion (07/13/2011), Chronic fatigue, CTS (carpal tunnel syndrome), Diabetes mellitus without complication (HCC), ESSENTIAL HYPERTENSION (03/13/2007), Fibromyalgia, GERD (gastroesophageal reflux disease), Heart murmur, adenomatous polyp of colon (06/09/2009), Hyperlipidemia, HYPERLIPIDEMIA (03/13/2007), Irritable bowel syndrome (05/24/2010), Multiple sclerosis, OBSTRUCTIVE SLEEP APNEA (12/08/2008), Sleep apnea, and VITAMIN D  DEFICIENCY (01/13/2010).  Vitamin D  deficiency Last vitamin D  Lab Results  Component Value Date   VD25OH 49 12/20/2023   Stable, cont oral replacement   Vitamin B 12 deficiency Lab Results  Component Value Date   VITAMINB12 212 12/20/2023   Low, pt prefers IM  - for start b12 1000 mcg weekly IM   Type 2 diabetes mellitus with hyperglycemia, with long-term current use of insulin  (HCC) Lab Results  Component Value Date   HGBA1C 6.7 (A) 10/27/2023   Stable, pt to continue current medical treatment metformin  ER 500 mg - 2 every day, ozemipc 2 mg weekly   Pure hypercholesterolemia Lab Results  Component Value  Date   LDLCALC 41 07/28/2023   Stable, pt to continue current statin lipitor 40 mg qd   Essential hypertension BP Readings from Last 3 Encounters:  02/02/24 124/82  12/28/23 116/72  12/20/23 119/78   Stable, pt to continue medical treatment norvasc  10 every day, losartan  100 mg every day, lopressor  100 mg bid   Allergic rhinitis Mild to mod, for add nasacort  asd,  to f/u any worsening symptoms or concerns   Thyroid  nodule Incidental found on recent MRI, 12 mm only - ok to follow for now but consider thyroid  u/s in 6-12 mo  Followup: Return in about 6 months (around 08/02/2024).  Lynwood Rush, MD 02/02/2024 12:54 PM [facility]

## 2024-02-02 NOTE — Assessment & Plan Note (Signed)
Mild to mod, for add nasacort asd,  to f/u any worsening symptoms or concerns

## 2024-02-13 ENCOUNTER — Other Ambulatory Visit: Payer: Self-pay | Admitting: Internal Medicine

## 2024-02-13 ENCOUNTER — Other Ambulatory Visit: Payer: Self-pay | Admitting: Neurology

## 2024-02-23 ENCOUNTER — Encounter (HOSPITAL_BASED_OUTPATIENT_CLINIC_OR_DEPARTMENT_OTHER): Admitting: Certified Nurse Midwife

## 2024-02-25 ENCOUNTER — Other Ambulatory Visit: Payer: Self-pay | Admitting: Neurology

## 2024-02-29 NOTE — Progress Notes (Unsigned)
 [facility] [address] [place] [address] Phone: [phone] Subjective:   Courtney [name] am a scribe for Dr. Claudene.   I'm seeing this patient by the request  of:  Norleen Lynwood ORN, MD  CC: Bilateral knee pain follow-up  YEP:Dlagzrupcz  12/28/2023 Bilateral injections given today.  He does have some worsening arthritis.  Discussed which activities to do and which ones to avoid.  Continue to work on weight loss.  May need the possibility of viscosupplementation again.  Follow-up with me again in 6 to 8 weeks.     Update 03/01/2024 Courtney [name] is a 48 y.o. female coming in with complaint of B knee pain. Patient states that she is here it get the gel injections for her knees today. The left knee is hurting more than the right currently.    12/28/2023 B knee xray IMPRESSION: 1. Mild symmetrical bilateral knee osteoarthritis. No acute fracture.    Past Medical History:  Diagnosis Date   ALLERGIC RHINITIS 03/13/2007   Allergy    Anemia, B12 deficiency    Anxiety    C. difficile colitis    CARPAL TUNNEL SYNDROME, BILATERAL 01/13/2010   Cervical lesion 07/13/2011   Spine lesion - indeterminate, for f/u MRI may 2013 per neurology   Chronic fatigue    CTS (carpal tunnel syndrome)    Diabetes mellitus without complication (HCC)    ESSENTIAL HYPERTENSION 03/13/2007   Fibromyalgia    GERD (gastroesophageal reflux disease)    Heart murmur    Hx of adenomatous polyp of colon 06/09/2009   Hyperlipidemia    HYPERLIPIDEMIA 03/13/2007   Irritable bowel syndrome 05/24/2010   Multiple sclerosis    OBSTRUCTIVE SLEEP APNEA 12/08/2008   Sleep apnea    uses c pap   VITAMIN D  DEFICIENCY 01/13/2010   Past Surgical History:  Procedure Laterality Date   APPENDECTOMY  07/23/12   CARPAL TUNNEL RELEASE  01/2010   bilateral   COLONOSCOPY W/ BIOPSIES AND POLYPECTOMY  05/2009   2 tubular adenomas   LAPAROSCOPIC APPENDECTOMY N/A 07/23/2012   Procedure:  APPENDECTOMY LAPAROSCOPIC;  Surgeon: Camellia CHRISTELLA Blush, MD;  Location: [facility] OR;  Service: General;  Laterality: N/A;   Social History   Socioeconomic History   Marital status: Single    Spouse name: Not on file   Number of children: 2   Years of education: Not on file   Highest education level: 12th grade  Occupational History   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    Employer: UNEMPLOYED  Tobacco Use   Smoking status: Never   Smokeless tobacco: Never  Vaping Use   Vaping status: Never Used  Substance and Sexual Activity   Alcohol use: No    Alcohol/week: 0.0 standard drinks of alcohol   Drug use: No   Sexual activity: Not on file  Other Topics Concern   Not on file  Social History Narrative   Daily Caffeine   Right handed   Two story home   Social Drivers of Health   Financial Resource Strain: Low Risk  (02/02/2024)   Overall Financial Resource Strain (CARDIA)    Difficulty of Paying Living Expenses: Not very hard  Food Insecurity: No Food Insecurity (02/02/2024)   Hunger Vital Sign    Worried About Running Out of Food in the Last Year: Never true    Ran Out of Food in the Last Year: Never true  Transportation Needs: No Transportation Needs (02/02/2024)   PRAPARE -  Administrator, Civil Service (Medical): No    Lack of Transportation (Non-Medical): No  Physical Activity: Insufficiently Active (02/02/2024)   Exercise Vital Sign    Days of Exercise per Week: 4 days    Minutes of Exercise per Session: 30 min  Stress: No Stress Concern Present (02/02/2024)   [employer] of Occupational Health - Occupational Stress Questionnaire    Feeling of Stress: Not at all  Social Connections: Moderately Isolated (02/02/2024)   Social Connection and Isolation Panel    Frequency of Communication with Friends and Family: Once a week    Frequency of Social Gatherings with Friends and Family: Once a week    Attends Religious Services: More than 4 times per year    Active  Member of [employer] or Organizations: Yes    Attends Banker Meetings: 1 to 4 times per year    Marital Status: Divorced   Allergies  Allergen Reactions   Ciprofloxacin  In D5w Rash   Penicillins     REACTION: rash   Tricor  [Fenofibrate ] Rash   Family History  Problem Relation Age of Onset   Migraines Mother    Hypertension Father    COPD Father    Diabetes Father    Prostate cancer Maternal Uncle        prostate cancer   Lung cancer Maternal Grandmother    Stroke Maternal Grandfather    Heart attack Maternal Grandfather    Diabetes Other    Hypertension Other    Asthma Daughter    Colon cancer Neg Hx    Esophageal cancer Neg Hx    Rectal cancer Neg Hx    Stomach cancer Neg Hx     Current Outpatient Medications (Endocrine & Metabolic):    insulin  lispro (HUMALOG  KWIKPEN) 100 UNIT/ML KwikPen, Inject under skin up to 60 units a day as advised   medroxyPROGESTERone  (DEPO-PROVERA ) 150 MG/ML injection, Inject 150 mg into the muscle every 3 (three) months.     metFORMIN  (GLUCOPHAGE -XR) 500 MG 24 hr tablet, TAKE 2 TABLETS BY MOUTH DAILY   Semaglutide , 2 MG/DOSE, 8 MG/3ML SOPN, Inject 2 mg as directed once a week.  Current Outpatient Medications (Cardiovascular):    amLODipine  (NORVASC ) 10 MG tablet, TAKE 1 TABLET BY MOUTH DAILY   atorvastatin  (LIPITOR) 40 MG tablet, TAKE 1 TABLET BY MOUTH DAILY   furosemide  (LASIX ) 40 MG tablet, TAKE 1 TABLET BY MOUTH DAILY   losartan  (COZAAR ) 100 MG tablet, TAKE 1 TABLET BY MOUTH DAILY   metoprolol  tartrate (LOPRESSOR ) 100 MG tablet, TAKE 2 TABLETS BY MOUTH DAILY  Current Outpatient Medications (Respiratory):    cetirizine (ZYRTEC) 10 MG tablet, Take 10 mg by mouth daily.    Current Outpatient Medications (Analgesics):    meloxicam  (MOBIC ) 15 MG tablet, TAKE 1 TABLET BY MOUTH DAILY  Current Outpatient Medications (Hematological):    cyanocobalamin  (VITAMIN B12) 1000 MCG/ML injection, Inject 1 mL (1,000 mcg total) into the muscle  every 30 (thirty) days.  Current Outpatient Medications (Other):    baclofen  (LIORESAL ) 10 MG tablet, TAKE 1 TABLET BY MOUTH 3 TIMES  DAILY   Cholecalciferol  (VITAMIN D ) 2000 UNITS CAPS, Take 2 capsules by mouth every morning. Reported on 06/05/2015   Continuous Glucose Sensor (FREESTYLE LIBRE 3 PLUS SENSOR) MISC, Inject 1 Device into the skin continuous. Change every 15 days   famotidine  (PEPCID ) 20 MG tablet, TAKE 1 TABLET BY MOUTH TWICE  DAILY   gabapentin  (NEURONTIN ) 300 MG capsule, TAKE 2 CAPSULES BY  MOUTH IN THE  MORNING 1 CAPSULE BY MOUTH IN  THE AFTERNOON AND 2 CAPSULES BY  MOUTH IN THE EVENING   glucose blood (FREESTYLE TEST STRIPS) test strip, 1 each by Other route 2 (two) times daily. And lancets, 2 times daily.   glucose monitoring kit (FREESTYLE) monitoring kit, 1 each by Does not apply route as needed for other. Dispense any model that is covered- dispense testing supplies for Q AC/ HS accuchecks- 1 month supply with one refil.   Insulin  Pen Needle 32G X 4 MM MISC, Use 4x a day   loperamide  (IMODIUM ) 2 MG capsule, Take 2 mg by mouth as needed for diarrhea or loose stools.   nortriptyline  (PAMELOR ) 50 MG capsule, TAKE 2 CAPSULES BY MOUTH EVERY  NIGHT AT BEDTIME   omeprazole-sodium bicarbonate (ZEGERID) 40-1100 MG per capsule, Take 1 capsule by mouth daily.     potassium chloride  (KLOR-CON ) 10 MEQ tablet, TAKE 2 TABLETS BY MOUTH DAILY   Teriflunomide  14 MG TABS, Take 1 tablet daily   triamcinolone  cream (KENALOG ) 0.5 %, Apply 1 Application topically 2 (two) times daily. To affected areas.   Reviewed prior external information including notes and imaging from  primary care provider As well as notes that were available from care everywhere and other healthcare systems.  Past medical history, social, surgical and family history all reviewed in electronic medical record.  No pertanent information unless stated regarding to the chief complaint.   Review of Systems:  No headache,  visual changes, nausea, vomiting, diarrhea, constipation, dizziness, abdominal pain, skin rash, fevers, chills, night sweats, weight loss, swollen lymph nodes, body aches, joint swelling, chest pain, shortness of breath, mood changes. POSITIVE muscle aches  Objective  Blood pressure 122/80, pulse 78, height 5' 3 (1.6 m), weight 186 lb 9.6 oz (84.6 kg), SpO2 98%.   General: No apparent distress alert and oriented x3 mood and affect normal, dressed appropriately.  HEENT: Pupils equal, extraocular movements intact  Respiratory: Patient's speak in full sentences and does not appear short of breath  Cardiovascular: No lower extremity edema, non tender, no erythema  Mild antalgic gait noted.  Arthritic changes noted of the knees bilaterally.  No crepitus noted.  Trace effusion of the knee bilaterally mostly the patellofemoral joint  After informed written and verbal consent, patient was seated on exam table. Right knee was prepped with alcohol swab and utilizing anterolateral approach, patient's right knee space was injected with 48 mg per 3 mL of Monovisc (sodium hyaluronate) in a prefilled syringe was injected easily into the knee through a 22-gauge needle..Patient tolerated the procedure well without immediate complications.  After informed written and verbal consent, patient was seated on exam table. Left knee was prepped with alcohol swab and utilizing anterolateral approach, patient's left knee space was injected with 48 mg per 3 mL of Monovisc (sodium hyaluronate) in a prefilled syringe was injected easily into the knee through a 22-gauge needle..Patient tolerated the procedure well without immediate complications.    Impression and Recommendations:    The above documentation has been reviewed and is accurate and complete Yamin Swingler M Becki Mccaskill, DO

## 2024-03-01 ENCOUNTER — Ambulatory Visit: Admitting: Family Medicine

## 2024-03-01 ENCOUNTER — Ambulatory Visit (INDEPENDENT_AMBULATORY_CARE_PROVIDER_SITE_OTHER): Admitting: Internal Medicine

## 2024-03-01 ENCOUNTER — Encounter: Payer: Self-pay | Admitting: Internal Medicine

## 2024-03-01 ENCOUNTER — Encounter: Payer: Self-pay | Admitting: Family Medicine

## 2024-03-01 ENCOUNTER — Ambulatory Visit

## 2024-03-01 VITALS — BP 122/80 | HR 78 | Ht 63.0 in | Wt 186.6 lb

## 2024-03-01 VITALS — BP 120/64 | HR 83 | Ht 63.0 in | Wt 186.8 lb

## 2024-03-01 DIAGNOSIS — E7800 Pure hypercholesterolemia, unspecified: Secondary | ICD-10-CM

## 2024-03-01 DIAGNOSIS — E538 Deficiency of other specified B group vitamins: Secondary | ICD-10-CM | POA: Diagnosis not present

## 2024-03-01 DIAGNOSIS — Z794 Long term (current) use of insulin: Secondary | ICD-10-CM

## 2024-03-01 DIAGNOSIS — M170 Bilateral primary osteoarthritis of knee: Secondary | ICD-10-CM

## 2024-03-01 DIAGNOSIS — E1165 Type 2 diabetes mellitus with hyperglycemia: Secondary | ICD-10-CM

## 2024-03-01 LAB — POCT GLYCOSYLATED HEMOGLOBIN (HGB A1C): Hemoglobin A1C: 6 % — AB (ref 4.0–5.6)

## 2024-03-01 MED ORDER — CYANOCOBALAMIN 1000 MCG/ML IJ SOLN
1000.0000 ug | Freq: Once | INTRAMUSCULAR | Status: AC
  Administered 2024-03-01: 1000 ug via INTRAMUSCULAR

## 2024-03-01 MED ORDER — SODIUM HYALURONATE 60 MG/3ML IX PRSY
120.0000 mg | PREFILLED_SYRINGE | Freq: Once | INTRA_ARTICULAR | Status: AC
  Administered 2024-03-01: 120 mg via INTRA_ARTICULAR

## 2024-03-01 NOTE — Addendum Note (Signed)
 Addended by: CLEOTILDE ROLIN RAMAN on: 03/01/2024 09:51 AM   Modules accepted: Orders

## 2024-03-01 NOTE — Patient Instructions (Addendum)
 Please continue: - Metforin ER 1000 mg with dinner - Humalog  - 15 min before meals - 4-5 units only before Holiday meals - you can stop after the Holidays - Ozempic  2 mg weekly  You need a new eye exam.    Please return in 4-6 months (before 07/27/2024).

## 2024-03-01 NOTE — Progress Notes (Signed)
 Pt here for monthly B12 injection per Dr. Norleen  B12 1000mcg given IM and pt tolerated injection well.  Next B12 injection scheduled for Next Month.

## 2024-03-01 NOTE — Progress Notes (Signed)
 Patient ID: MARIELIS SAMARA, female   DOB: May 25, 1975, 48 y.o.   MRN: [id number]  HPI: ROZALYN OSLAND is a 48 y.o.-year-old female, returning for follow-up for DM2, dx in 2016 (when she presented in hyperglycemic nonketotic hyperosmolar state), insulin -dependent since 2018, uncontrolled, without long-term complications. Pt. previously saw Dr. Kassie, but last visit with me 4 months ago.  Interim history: No increased urination, blurry vision, chest pain, nausea. She was able to stop long-acting insulin  completely approximately a month ago.  She also greatly reduced her mealtime insulin  doses. She feels great and lost 11 pounds since last visit.  Reviewed HbA1c: Lab Results  Component Value Date   HGBA1C 6.7 (A) 10/27/2023   HGBA1C 6.3 07/28/2023   HGBA1C 7.9 (A) 03/30/2023   HGBA1C 6.9 (A) 04/15/2022   HGBA1C 6.6 (A) 12/09/2021   HGBA1C 7.2 (A) 08/23/2021   HGBA1C 8.2 (H) 06/24/2021   HGBA1C 8.4 (A) 05/07/2021   HGBA1C 8.7 (A) 03/05/2021   HGBA1C 8.0 (A) 01/04/2021   At our first visit, she was on: - Rybelsus  14 mg at waking up >> Ozempic  0.5 mg weekly -tolerated well - Lantus  28 units at bedtime - Lispro 23-18-12 units before meals >> 15 min before meals She tried metformin  but this caused nausea. She tried Invokana  but this caused vaginitis. Per review of Dr. Laymond note, she declined weight loss surgery.   Also, per his note, she declined an insulin  pump.  Now on: - Metformin  ER 1000 mg in am >> moved to dinnertime - Lantus  28 >> 34-36 >> 25 units at bedtime >> OFF - Humalog  - 15 min before meals: - before coffee: 20 >> 15 >> 5 units - before lunch: 16-20 >> 12 >> 0 units - before dinner: 14-18 >> 16-20 units (add 2 to 4 units before a larger dinner) >> 12 >> 5 units only for large meals - Ozempic  1 >> 2 mg weekly   Pt checks her sugars >4x a day with the Libre CGM [facility]):  Previously:  Previously:   Lowest sugar was 65 >> ... 70 >> 65 >> 60s; she has  hypoglycemia awareness at 70.  Highest sugar was 300s >> 200s >> 200s >> <200  - no CKD, last BUN/creatinine:  Lab Results  Component Value Date   BUN 13 07/28/2023   BUN 13 03/30/2023   CREATININE 0.97 07/28/2023   CREATININE 1.06 (H) 03/30/2023   Lab Results  Component Value Date   MICRALBCREAT 8.7 07/28/2023   MICRALBCREAT 11 03/30/2023  On Cozaar  100 mg daily.  -+ HL; last set of lipids: Lab Results  Component Value Date   CHOL 116 07/28/2023   HDL 44.90 07/28/2023   LDLCALC 41 07/28/2023   LDLDIRECT 53.0 06/24/2021   TRIG 149.0 07/28/2023   CHOLHDL 3 07/28/2023  On atorvastatin  40 mg daily.  She cannot take fenofibrate  due to rash.  At last visit I suggested to add 1000 mg fish oil daily.  - last eye exam was in 02/2022. No DR reportedly.   - no numbness and tingling in her feet. She is on gabapentin  300 mg tablets.  Last foot exam 07/28/2023.  She also has a history of HTN, MS, fibromyalgia, chronic fatigue, OSA-on CPAP, GERD, IBS, vitamin D  deficiency, B12 anemia. 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  ROS: + see HPI  Past Medical History:  Diagnosis Date   ALLERGIC RHINITIS 03/13/2007   Allergy    Anemia,  B12 deficiency    Anxiety    C. difficile colitis    CARPAL TUNNEL SYNDROME, BILATERAL 01/13/2010   Cervical lesion 07/13/2011   Spine lesion - indeterminate, for f/u MRI may 2013 per neurology   Chronic fatigue    CTS (carpal tunnel syndrome)    Diabetes mellitus without complication (HCC)    ESSENTIAL HYPERTENSION 03/13/2007   Fibromyalgia    GERD (gastroesophageal reflux disease)    Heart murmur    Hx of adenomatous polyp of colon 06/09/2009   Hyperlipidemia    HYPERLIPIDEMIA 03/13/2007   Irritable bowel syndrome 05/24/2010   Multiple sclerosis    OBSTRUCTIVE SLEEP APNEA 12/08/2008   Sleep apnea    uses c pap   VITAMIN D  DEFICIENCY 01/13/2010   Past Surgical History:  Procedure Laterality Date   APPENDECTOMY   07/23/12   CARPAL TUNNEL RELEASE  01/2010   bilateral   COLONOSCOPY W/ BIOPSIES AND POLYPECTOMY  05/2009   2 tubular adenomas   LAPAROSCOPIC APPENDECTOMY N/A 07/23/2012   Procedure: APPENDECTOMY LAPAROSCOPIC;  Surgeon: Camellia CHRISTELLA Blush, MD;  Location: [facility] OR;  Service: General;  Laterality: N/A;   Social History   Socioeconomic History   Marital status: Single    Spouse name: Not on file   Number of children: 2   Years of education: Not on file   Highest education level: 12th grade  Occupational History   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    Employer: UNEMPLOYED  Tobacco Use   Smoking status: Never   Smokeless tobacco: Never  Vaping Use   Vaping status: Never Used  Substance and Sexual Activity   Alcohol use: No    Alcohol/week: 0.0 standard drinks of alcohol   Drug use: No   Sexual activity: Not on file  Other Topics Concern   Not on file  Social History Narrative   Daily Caffeine   Right handed   Two story home   Social Drivers of Health   Financial Resource Strain: Low Risk  (02/02/2024)   Overall Financial Resource Strain (CARDIA)    Difficulty of Paying Living Expenses: Not very hard  Food Insecurity: No Food Insecurity (02/02/2024)   Hunger Vital Sign    Worried About Running Out of Food in the Last Year: Never true    Ran Out of Food in the Last Year: Never true  Transportation Needs: No Transportation Needs (02/02/2024)   PRAPARE - Administrator, Civil Service (Medical): No    Lack of Transportation (Non-Medical): No  Physical Activity: Insufficiently Active (02/02/2024)   Exercise Vital Sign    Days of Exercise per Week: 4 days    Minutes of Exercise per Session: 30 min  Stress: No Stress Concern Present (02/02/2024)   [employer] of Occupational Health - Occupational Stress Questionnaire    Feeling of Stress: Not at all  Social Connections: Moderately Isolated (02/02/2024)   Social Connection and Isolation Panel    Frequency of  Communication with Friends and Family: Once a week    Frequency of Social Gatherings with Friends and Family: Once a week    Attends Religious Services: More than 4 times per year    Active Member of [employer] or Organizations: Yes    Attends Banker Meetings: 1 to 4 times per year    Marital Status: Divorced  Catering Manager Violence: Not on file   Current Outpatient Medications on File Prior to Visit  Medication Sig Dispense Refill   amLODipine  (NORVASC ) 10  MG tablet TAKE 1 TABLET BY MOUTH DAILY 100 tablet 2   atorvastatin  (LIPITOR) 40 MG tablet TAKE 1 TABLET BY MOUTH DAILY 100 tablet 2   baclofen  (LIORESAL ) 10 MG tablet TAKE 1 TABLET BY MOUTH 3 TIMES  DAILY 90 tablet 1   cetirizine (ZYRTEC) 10 MG tablet Take 10 mg by mouth daily.       Cholecalciferol  (VITAMIN D ) 2000 UNITS CAPS Take 2 capsules by mouth every morning. Reported on 06/05/2015     Continuous Glucose Sensor (FREESTYLE LIBRE 3 PLUS SENSOR) MISC Inject 1 Device into the skin continuous. Change every 15 days 6 each 3   cyanocobalamin  (VITAMIN B12) 1000 MCG/ML injection Inject 1 mL (1,000 mcg total) into the muscle every 30 (thirty) days. 3 mL 3   famotidine  (PEPCID ) 20 MG tablet TAKE 1 TABLET BY MOUTH TWICE  DAILY 200 tablet 2   furosemide  (LASIX ) 40 MG tablet TAKE 1 TABLET BY MOUTH DAILY 100 tablet 2   gabapentin  (NEURONTIN ) 300 MG capsule TAKE 2 CAPSULES BY MOUTH IN THE  MORNING 1 CAPSULE BY MOUTH IN  THE AFTERNOON AND 2 CAPSULES BY  MOUTH IN THE EVENING 500 capsule 2   glucose blood (FREESTYLE TEST STRIPS) test strip 1 each by Other route 2 (two) times daily. And lancets, 2 times daily. 180 each 3   glucose monitoring kit (FREESTYLE) monitoring kit 1 each by Does not apply route as needed for other. Dispense any model that is covered- dispense testing supplies for Q AC/ HS accuchecks- 1 month supply with one refil. 1 each 1   insulin  lispro (HUMALOG  KWIKPEN) 100 UNIT/ML KwikPen Inject under skin up to 60 units a day as  advised 45 mL 3   Insulin  Pen Needle 32G X 4 MM MISC Use 4x a day 400 each 3   loperamide  (IMODIUM ) 2 MG capsule Take 2 mg by mouth as needed for diarrhea or loose stools.     losartan  (COZAAR ) 100 MG tablet TAKE 1 TABLET BY MOUTH DAILY 100 tablet 2   medroxyPROGESTERone  (DEPO-PROVERA ) 150 MG/ML injection Inject 150 mg into the muscle every 3 (three) months.       meloxicam  (MOBIC ) 15 MG tablet TAKE 1 TABLET BY MOUTH DAILY 100 tablet 2   metFORMIN  (GLUCOPHAGE -XR) 500 MG 24 hr tablet TAKE 2 TABLETS BY MOUTH DAILY 200 tablet 2   metoprolol  tartrate (LOPRESSOR ) 100 MG tablet TAKE 2 TABLETS BY MOUTH DAILY 200 tablet 2   nortriptyline  (PAMELOR ) 50 MG capsule TAKE 2 CAPSULES BY MOUTH EVERY  NIGHT AT BEDTIME 180 capsule 3   omeprazole-sodium bicarbonate (ZEGERID) 40-1100 MG per capsule Take 1 capsule by mouth daily.       potassium chloride  (KLOR-CON ) 10 MEQ tablet TAKE 2 TABLETS BY MOUTH DAILY 200 tablet 2   Semaglutide , 2 MG/DOSE, 8 MG/3ML SOPN Inject 2 mg as directed once a week. 9 mL 3   Teriflunomide  14 MG TABS Take 1 tablet daily 30 tablet 5   traMADol  (ULTRAM ) 50 MG tablet Take 1 tablet (50 mg total) by mouth every 6 (six) hours as needed. 20 tablet 1   triamcinolone  cream (KENALOG ) 0.5 % Apply 1 Application topically 2 (two) times daily. To affected areas. 30 g 3   No current facility-administered medications on file prior to visit.   Allergies  Allergen Reactions   Ciprofloxacin  In D5w Rash   Penicillins     REACTION: rash   Tricor  [Fenofibrate ] Rash   Family History  Problem Relation Age of Onset  Migraines Mother    Hypertension Father    COPD Father    Diabetes Father    Prostate cancer Maternal Uncle        prostate cancer   Lung cancer Maternal Grandmother    Stroke Maternal Grandfather    Heart attack Maternal Grandfather    Diabetes Other    Hypertension Other    Asthma Daughter    Colon cancer Neg Hx    Esophageal cancer Neg Hx    Rectal cancer Neg Hx    Stomach  cancer Neg Hx    PE: BP 120/64   Pulse 83   Ht 5' 3 (1.6 m)   Wt 186 lb 12.8 oz (84.7 kg)   SpO2 99%   BMI 33.09 kg/m  Wt Readings from Last 15 Encounters:  03/01/24 186 lb 12.8 oz (84.7 kg)  02/02/24 188 lb (85.3 kg)  12/20/23 196 lb (88.9 kg)  10/27/23 197 lb 9.6 oz (89.6 kg)  07/28/23 204 lb (92.5 kg)  06/23/23 203 lb 12.8 oz (92.4 kg)  03/30/23 202 lb 6.4 oz (91.8 kg)  08/11/22 195 lb (88.5 kg)  04/15/22 196 lb 9.6 oz (89.2 kg)  01/07/22 190 lb (86.2 kg)  01/07/22 190 lb (86.2 kg)  12/09/21 192 lb 12.8 oz (87.5 kg)  08/23/21 200 lb (90.7 kg)  07/06/21 204 lb (92.5 kg)  07/05/21 202 lb 9.6 oz (91.9 kg)   Constitutional: overweight, in NAD Eyes: no exophthalmos ENT: no thyromegaly, no cervical lymphadenopathy Cardiovascular: RRR, No MRG, + B LE edema (chronic) Respiratory: CTA B Musculoskeletal: no deformities Skin: no rashes Neurological: + Mild tremor with outstretched hands (chronic)  ASSESSMENT: 1. DM2, insulin -dependent, previously uncontrolled, without long-term complications  2. HL  PLAN:  1. Patient with longstanding type 2 diabetes, previously uncontrolled but with improved control in the last 2 years.  Latest HbA1c was slightly higher, at 6.7%, but still at goal.  Sugars were fairly well-controlled per review of her CGM at last visit, fluctuating within the target range but with a trend towards increasing blood sugars overnight, peaking around 8 AM, around the time when she started to move around and drink her coffee.  I recommended to increase her insulin  for coffee. CGM interpretation: -At today's visit, we reviewed her CGM downloads: It appears that 100% of values are in target range (goal >70%), while 0% are higher than 180 (goal <25%), and 0% are lower than 70 (goal <4%).  The calculated average blood sugar is 115.  The projected HbA1c for the next 3 months (GMI) is 6.1%. -Reviewing the CGM trends, sugars are excellent, fluctuating within the target  range with a low coefficient of variation.  She mentions that she was able to stop long-acting insulin  approximately a month ago.  She did contact me at that time and she had low blood sugars then.  She also reduced her Humalog  doses and she is now taking a low-dose before large meals only.  She continues metformin  and Ozempic .  At today's visit, I advised her to continue these but only use the Humalog  for larger holiday meals and stop the Humalog  completely after the holidays. -I suggested to: Patient Instructions  Please continue: - Metforin ER 1000 mg with dinner - Humalog  - 15 min before meals - 4-5 units only before Holiday meals - you can stop after the Holidays - Ozempic  2 mg weekly  You need a new eye exam.    Please return in 4-6 months (before 07/27/2024).  -  we checked her HbA1c: 6.0% (lower) - advised to check sugars at different times of the day - 4x a day, rotating check times - advised for yearly eye exams >> she is not UTD - return to clinic in 4-6 months  2. HL - Lipid panel was reviewed from 07/2023: All fractions at goal: Lab Results  Component Value Date   CHOL 116 07/28/2023   HDL 44.90 07/28/2023   LDLCALC 41 07/28/2023   LDLDIRECT 53.0 06/24/2021   TRIG 149.0 07/28/2023   CHOLHDL 3 07/28/2023  - On Lipitor 40 mg daily without side effects.  She had a rash to fenofibrate  in the past.  Lela Fendt, MD PhD [facility] Endocrinology

## 2024-03-01 NOTE — Assessment & Plan Note (Addendum)
 Viscosupplementation given again today.  Tolerated the procedure well, discussed icing regimen and home exercises, increase activity slowly.  Follow-up again in 6 to 12 weeks chronic problem with worsening symptoms.

## 2024-03-01 NOTE — Patient Instructions (Addendum)
 Good to see you. Durolane injections today. Happy Holidays See me again in 3 months

## 2024-03-25 ENCOUNTER — Encounter (HOSPITAL_BASED_OUTPATIENT_CLINIC_OR_DEPARTMENT_OTHER): Payer: Self-pay | Admitting: Certified Nurse Midwife

## 2024-03-25 ENCOUNTER — Other Ambulatory Visit (HOSPITAL_COMMUNITY)
Admission: RE | Admit: 2024-03-25 | Discharge: 2024-03-25 | Disposition: A | Discharge status: Home / Self Care | Source: Ambulatory Visit | Attending: Certified Nurse Midwife | Admitting: Certified Nurse Midwife

## 2024-03-25 ENCOUNTER — Ambulatory Visit (INDEPENDENT_AMBULATORY_CARE_PROVIDER_SITE_OTHER): Admitting: Certified Nurse Midwife

## 2024-03-25 VITALS — BP 134/74 | HR 82 | Ht 61.0 in | Wt 183.6 lb

## 2024-03-25 DIAGNOSIS — Z124 Encounter for screening for malignant neoplasm of cervix: Secondary | ICD-10-CM

## 2024-03-25 DIAGNOSIS — Z01419 Encounter for gynecological examination (general) (routine) without abnormal findings: Secondary | ICD-10-CM | POA: Diagnosis not present

## 2024-03-25 DIAGNOSIS — N912 Amenorrhea, unspecified: Secondary | ICD-10-CM | POA: Diagnosis not present

## 2024-03-25 DIAGNOSIS — Z1231 Encounter for screening mammogram for malignant neoplasm of breast: Secondary | ICD-10-CM | POA: Diagnosis not present

## 2024-03-25 DIAGNOSIS — Z1331 Encounter for screening for depression: Secondary | ICD-10-CM

## 2024-03-25 DIAGNOSIS — Z1151 Encounter for screening for human papillomavirus (HPV): Secondary | ICD-10-CM | POA: Diagnosis not present

## 2024-03-25 NOTE — Progress Notes (Signed)
 48 y.o. [id number] Single White or Caucasian female here for annual exam.  Her last Depo Provera  was over 6 months ago. She has not had a period during this time.          Sexually active: No.  The current method of family planning is post menopausal status.     Exercising: Yes.     Smoker:  no  Health Maintenance: Pap:  Last pap was years ago History of abnormal Pap:  no MMG:  Pt has never had mammogram but is open to mammogram   reports that she has never smoked. She has never used smokeless tobacco. She reports that she does not drink alcohol and does not use drugs.  Past Medical History:  Diagnosis Date   ALLERGIC RHINITIS 03/13/2007   Allergy    Anemia, B12 deficiency    Anxiety    C. difficile colitis    CARPAL TUNNEL SYNDROME, BILATERAL 01/13/2010   Cervical lesion 07/13/2011   Spine lesion - indeterminate, for f/u MRI may 2013 per neurology   Chronic fatigue    CTS (carpal tunnel syndrome)    Diabetes mellitus without complication (HCC)    ESSENTIAL HYPERTENSION 03/13/2007   Fibromyalgia    GERD (gastroesophageal reflux disease)    Heart murmur    Hx of adenomatous polyp of colon 06/09/2009   Hyperlipidemia    HYPERLIPIDEMIA 03/13/2007   Irritable bowel syndrome 05/24/2010   MS (multiple sclerosis)    Multiple sclerosis    OBSTRUCTIVE SLEEP APNEA 12/08/2008   Sleep apnea    uses c pap   VITAMIN D  DEFICIENCY 01/13/2010    Past Surgical History:  Procedure Laterality Date   APPENDECTOMY  07/23/12   CARPAL TUNNEL RELEASE  01/2010   bilateral   COLONOSCOPY W/ BIOPSIES AND POLYPECTOMY  05/2009   2 tubular adenomas   LAPAROSCOPIC APPENDECTOMY N/A 07/23/2012   Procedure: APPENDECTOMY LAPAROSCOPIC;  Surgeon: Camellia CHRISTELLA Blush, MD;  Location: [facility] OR;  Service: General;  Laterality: N/A;    Current Outpatient Medications  Medication Sig Dispense Refill   amLODipine  (NORVASC ) 10 MG tablet TAKE 1 TABLET BY MOUTH DAILY 100 tablet 2   atorvastatin  (LIPITOR) 40 MG tablet  TAKE 1 TABLET BY MOUTH DAILY 100 tablet 2   baclofen  (LIORESAL ) 10 MG tablet TAKE 1 TABLET BY MOUTH 3 TIMES  DAILY 90 tablet 1   cetirizine (ZYRTEC) 10 MG tablet Take 10 mg by mouth daily.       Cholecalciferol  (VITAMIN D ) 2000 UNITS CAPS Take 2 capsules by mouth every morning. Reported on 06/05/2015     Continuous Glucose Sensor (FREESTYLE LIBRE 3 PLUS SENSOR) MISC Inject 1 Device into the skin continuous. Change every 15 days 6 each 3   cyanocobalamin  (VITAMIN B12) 1000 MCG/ML injection Inject 1 mL (1,000 mcg total) into the muscle every 30 (thirty) days. 3 mL 3   famotidine  (PEPCID ) 20 MG tablet TAKE 1 TABLET BY MOUTH TWICE  DAILY 200 tablet 2   furosemide  (LASIX ) 40 MG tablet TAKE 1 TABLET BY MOUTH DAILY 100 tablet 2   gabapentin  (NEURONTIN ) 300 MG capsule TAKE 2 CAPSULES BY MOUTH IN THE  MORNING 1 CAPSULE BY MOUTH IN  THE AFTERNOON AND 2 CAPSULES BY  MOUTH IN THE EVENING 500 capsule 2   glucose blood (FREESTYLE TEST STRIPS) test strip 1 each by Other route 2 (two) times daily. And lancets, 2 times daily. 180 each 3   glucose monitoring kit (FREESTYLE) monitoring kit 1 each by Does not  apply route as needed for other. Dispense any model that is covered- dispense testing supplies for Q AC/ HS accuchecks- 1 month supply with one refil. 1 each 1   insulin  lispro (HUMALOG  KWIKPEN) 100 UNIT/ML KwikPen Inject under skin up to 60 units a day as advised 45 mL 3   Insulin  Pen Needle 32G X 4 MM MISC Use 4x a day 400 each 3   loperamide  (IMODIUM ) 2 MG capsule Take 2 mg by mouth as needed for diarrhea or loose stools.     losartan  (COZAAR ) 100 MG tablet TAKE 1 TABLET BY MOUTH DAILY 100 tablet 2   meloxicam  (MOBIC ) 15 MG tablet TAKE 1 TABLET BY MOUTH DAILY 100 tablet 2   metFORMIN  (GLUCOPHAGE -XR) 500 MG 24 hr tablet TAKE 2 TABLETS BY MOUTH DAILY 200 tablet 2   metoprolol  tartrate (LOPRESSOR ) 100 MG tablet TAKE 2 TABLETS BY MOUTH DAILY 200 tablet 2   nortriptyline  (PAMELOR ) 50 MG capsule TAKE 2 CAPSULES BY  MOUTH EVERY  NIGHT AT BEDTIME 180 capsule 3   omeprazole-sodium bicarbonate (ZEGERID) 40-1100 MG per capsule Take 1 capsule by mouth daily.       potassium chloride  (KLOR-CON ) 10 MEQ tablet TAKE 2 TABLETS BY MOUTH DAILY 200 tablet 2   Semaglutide , 2 MG/DOSE, 8 MG/3ML SOPN Inject 2 mg as directed once a week. 9 mL 3   Teriflunomide  14 MG TABS Take 1 tablet daily 30 tablet 5   medroxyPROGESTERone  (DEPO-PROVERA ) 150 MG/ML injection Inject 150 mg into the muscle every 3 (three) months.   (Patient not taking: Reported on 03/25/2024)     triamcinolone  cream (KENALOG ) 0.5 % Apply 1 Application topically 2 (two) times daily. To affected areas. (Patient not taking: Reported on 03/25/2024) 30 g 3   No current facility-administered medications for this visit.    Family History  Problem Relation Age of Onset   Migraines Mother    Hypertension Father    COPD Father    Diabetes Father    Prostate cancer Maternal Uncle        prostate cancer   Lung cancer Maternal Grandmother    Stroke Maternal Grandfather    Heart attack Maternal Grandfather    Diabetes Other    Hypertension Other    Asthma Daughter    Colon cancer Neg Hx    Esophageal cancer Neg Hx    Rectal cancer Neg Hx    Stomach cancer Neg Hx     ROS: Constitutional: negative Genitourinary:negative  Exam:   BP 134/74   Pulse 82   Ht 5' 1 (1.549 m)   Wt 183 lb 9.6 oz (83.3 kg)   BMI 34.69 kg/m   Height: 5' 1 (154.9 cm)  General appearance: alert, cooperative and appears stated age Head: Normocephalic, without obvious abnormality, atraumatic Lungs: clear to auscultation bilaterally Breasts: normal appearance, no masses or tenderness, Inspection negative, No nipple retraction or dimpling, No nipple discharge or bleeding, No axillary or supraclavicular adenopathy, Normal to palpation without dominant masses Heart: regular rate and rhythm Abdomen: soft, non-tender; bowel sounds normal; no masses,  no organomegaly Extremities:  extremities normal, atraumatic, no cyanosis or edema Skin: Skin color, texture, turgor normal. No rashes or lesions Lymph nodes: Cervical, supraclavicular, and axillary nodes normal. No abnormal inguinal nodes palpated Neurologic: Grossly normal   Pelvic: External genitalia:  no lesions              Urethra:  normal appearing urethra with no masses, tenderness or lesions  Bartholins and Skenes: normal                 Vagina: normal appearing vagina with normal color and no discharge, no lesions              Cervix: multiparous appearance, no bleeding following Pap, no cervical motion tenderness, and no lesions              Pap taken: Yes.   Bimanual Exam:  Uterus:  normal size, contour, position, consistency, mobility, non-tender              Adnexa: no mass, fullness, tenderness               Rectovaginal: Confirms               Anus:  normal sphincter tone, no lesions  Chaperone,  CMA, was present for exam.  Assessment/Plan:   1. Women's annual routine gynecological examination (Primary) - Breast self awareness and annual screening mammograms encouraged - Pt will discuss Colon Cancer Screening with PCP  2. Amenorrhea - [facility]  3. Encounter for screening mammogram for malignant neoplasm of breast - MM 3D SCREENING MAMMOGRAM BILATERAL BREAST; Future - MM 3D SCREENING MAMMOGRAM BILATERAL BREAST; Future  4. Screening for cervical cancer - Cytology - PAP( [facility])   RTO in 1 year for annual gyn exam and prn if issues arise. Arland MARLA Roller

## 2024-03-26 LAB — FOLLICLE STIMULATING HORMONE: FSH: 108 m[IU]/mL

## 2024-03-27 ENCOUNTER — Ambulatory Visit (HOSPITAL_BASED_OUTPATIENT_CLINIC_OR_DEPARTMENT_OTHER): Payer: Self-pay | Admitting: Certified Nurse Midwife

## 2024-03-27 DIAGNOSIS — Z780 Asymptomatic menopausal state: Secondary | ICD-10-CM | POA: Insufficient documentation

## 2024-03-27 LAB — CYTOLOGY - PAP
Adequacy: ABSENT
Comment: NEGATIVE
Diagnosis: NEGATIVE
High risk HPV: NEGATIVE

## 2024-04-03 ENCOUNTER — Other Ambulatory Visit: Payer: Self-pay | Admitting: Internal Medicine

## 2024-04-15 ENCOUNTER — Encounter: Payer: Self-pay | Admitting: *Deleted

## 2024-04-15 NOTE — Progress Notes (Signed)
 Courtney [name]                                          MRN: [id number]   04/15/2024   The [facility] Quality Team Specialist reviewed this patient medical record for the purposes of chart review for care gap closure. The following were reviewed: abstraction for care gap closure-glycemic status assessment and kidney health evaluation for diabetes:eGFR  and uACR.    [facility] Quality Team

## 2024-04-15 NOTE — Progress Notes (Signed)
 Courtney [name]                                          MRN: [id number]   04/15/2024   The [facility] Quality Team Specialist reviewed this patient medical record for the purposes of chart review for care gap closure. The following were reviewed: chart review for care gap closure-breast cancer screening and diabetic eye exam.    [facility] Quality Team

## 2024-04-30 ENCOUNTER — Encounter: Payer: Self-pay | Admitting: Internal Medicine

## 2024-05-01 MED ORDER — "NEEDLE (DISP) 25G X 1"" MISC": 3 refills | Status: AC

## 2024-05-01 MED ORDER — "SYRINGE 25G X 1"" 3 ML MISC": 3 refills | Status: AC

## 2024-05-04 ENCOUNTER — Other Ambulatory Visit: Payer: Self-pay | Admitting: Podiatry

## 2024-05-04 ENCOUNTER — Other Ambulatory Visit: Payer: Self-pay | Admitting: Internal Medicine

## 2024-05-06 ENCOUNTER — Other Ambulatory Visit: Payer: Self-pay

## 2024-06-07 ENCOUNTER — Ambulatory Visit: Admitting: Family Medicine

## 2024-06-21 ENCOUNTER — Ambulatory Visit: Admitting: Neurology

## 2024-08-02 ENCOUNTER — Ambulatory Visit: Admitting: Internal Medicine

## 2024-08-30 ENCOUNTER — Ambulatory Visit: Admitting: Internal Medicine
